# Patient Record
Sex: Female | Born: 1945 | Race: Black or African American | Hispanic: No | Marital: Married | Smoking: Former smoker
Health system: Southern US, Community
[De-identification: ages and names within clinical notes are randomized; demographics above are authoritative.]

## PROBLEM LIST (undated history)

## (undated) DIAGNOSIS — I639 Cerebral infarction, unspecified: Secondary | ICD-10-CM

## (undated) DIAGNOSIS — E785 Hyperlipidemia, unspecified: Secondary | ICD-10-CM

## (undated) DIAGNOSIS — H409 Unspecified glaucoma: Secondary | ICD-10-CM

## (undated) DIAGNOSIS — I10 Essential (primary) hypertension: Secondary | ICD-10-CM

## (undated) DIAGNOSIS — E119 Type 2 diabetes mellitus without complications: Secondary | ICD-10-CM

## (undated) HISTORY — DX: Type 2 diabetes mellitus without complications: E11.9

## (undated) HISTORY — DX: Essential (primary) hypertension: I10

## (undated) HISTORY — DX: Unspecified glaucoma: H40.9

## (undated) HISTORY — DX: Hyperlipidemia, unspecified: E78.5

## (undated) HISTORY — PX: LIPOMA EXCISION: SHX5283

---

## 1998-07-12 ENCOUNTER — Encounter: Admission: RE | Admit: 1998-07-12 | Discharge: 1998-07-12 | Payer: Self-pay | Admitting: Sports Medicine

## 1998-12-16 ENCOUNTER — Encounter: Payer: Self-pay | Admitting: Emergency Medicine

## 1998-12-16 ENCOUNTER — Emergency Department (HOSPITAL_COMMUNITY): Admission: EM | Admit: 1998-12-16 | Discharge: 1998-12-16 | Payer: Self-pay | Admitting: Emergency Medicine

## 1998-12-17 ENCOUNTER — Encounter: Admission: RE | Admit: 1998-12-17 | Discharge: 1998-12-17 | Payer: Self-pay | Admitting: Family Medicine

## 1999-01-14 ENCOUNTER — Encounter: Admission: RE | Admit: 1999-01-14 | Discharge: 1999-01-14 | Payer: Self-pay | Admitting: Family Medicine

## 1999-05-03 ENCOUNTER — Encounter: Admission: RE | Admit: 1999-05-03 | Discharge: 1999-05-03 | Payer: Self-pay | Admitting: Family Medicine

## 2000-10-18 ENCOUNTER — Other Ambulatory Visit: Admission: RE | Admit: 2000-10-18 | Discharge: 2000-10-18 | Payer: Self-pay | Admitting: Obstetrics and Gynecology

## 2001-01-29 ENCOUNTER — Encounter: Admission: RE | Admit: 2001-01-29 | Discharge: 2001-01-29 | Payer: Self-pay | Admitting: Sports Medicine

## 2001-02-02 ENCOUNTER — Emergency Department (HOSPITAL_COMMUNITY): Admission: EM | Admit: 2001-02-02 | Discharge: 2001-02-02 | Payer: Self-pay | Admitting: Emergency Medicine

## 2001-02-02 ENCOUNTER — Encounter: Payer: Self-pay | Admitting: Emergency Medicine

## 2001-02-03 ENCOUNTER — Emergency Department (HOSPITAL_COMMUNITY): Admission: EM | Admit: 2001-02-03 | Discharge: 2001-02-03 | Payer: Self-pay | Admitting: Emergency Medicine

## 2001-02-03 ENCOUNTER — Encounter: Payer: Self-pay | Admitting: Emergency Medicine

## 2001-02-04 ENCOUNTER — Encounter: Admission: RE | Admit: 2001-02-04 | Discharge: 2001-02-04 | Payer: Self-pay | Admitting: Family Medicine

## 2001-12-02 ENCOUNTER — Other Ambulatory Visit: Admission: RE | Admit: 2001-12-02 | Discharge: 2001-12-02 | Payer: Self-pay | Admitting: Obstetrics and Gynecology

## 2002-01-17 ENCOUNTER — Encounter: Payer: Self-pay | Admitting: Gastroenterology

## 2002-01-17 ENCOUNTER — Encounter: Admission: RE | Admit: 2002-01-17 | Discharge: 2002-01-17 | Payer: Self-pay | Admitting: Gastroenterology

## 2002-02-12 ENCOUNTER — Ambulatory Visit (HOSPITAL_COMMUNITY): Admission: RE | Admit: 2002-02-12 | Discharge: 2002-02-12 | Payer: Self-pay | Admitting: Gastroenterology

## 2002-06-30 ENCOUNTER — Encounter (INDEPENDENT_AMBULATORY_CARE_PROVIDER_SITE_OTHER): Payer: Self-pay | Admitting: *Deleted

## 2002-06-30 LAB — CONVERTED CEMR LAB

## 2002-07-09 ENCOUNTER — Encounter: Admission: RE | Admit: 2002-07-09 | Discharge: 2002-07-09 | Payer: Self-pay | Admitting: Family Medicine

## 2002-08-11 ENCOUNTER — Encounter: Admission: RE | Admit: 2002-08-11 | Discharge: 2002-08-11 | Payer: Self-pay | Admitting: Family Medicine

## 2002-08-15 ENCOUNTER — Encounter: Payer: Self-pay | Admitting: Sports Medicine

## 2002-08-15 ENCOUNTER — Encounter: Admission: RE | Admit: 2002-08-15 | Discharge: 2002-08-15 | Payer: Self-pay | Admitting: Sports Medicine

## 2003-02-26 ENCOUNTER — Other Ambulatory Visit: Admission: RE | Admit: 2003-02-26 | Discharge: 2003-02-26 | Payer: Self-pay | Admitting: Obstetrics and Gynecology

## 2003-03-27 ENCOUNTER — Encounter: Admission: RE | Admit: 2003-03-27 | Discharge: 2003-03-27 | Payer: Self-pay | Admitting: Obstetrics and Gynecology

## 2004-01-18 ENCOUNTER — Ambulatory Visit: Payer: Self-pay | Admitting: Family Medicine

## 2004-01-22 ENCOUNTER — Emergency Department (HOSPITAL_COMMUNITY): Admission: EM | Admit: 2004-01-22 | Discharge: 2004-01-22 | Payer: Self-pay | Admitting: Emergency Medicine

## 2004-02-16 ENCOUNTER — Emergency Department (HOSPITAL_COMMUNITY): Admission: EM | Admit: 2004-02-16 | Discharge: 2004-02-16 | Payer: Self-pay | Admitting: Family Medicine

## 2004-05-30 ENCOUNTER — Ambulatory Visit: Payer: Self-pay | Admitting: Internal Medicine

## 2005-08-21 ENCOUNTER — Ambulatory Visit: Payer: Self-pay | Admitting: Internal Medicine

## 2005-11-07 ENCOUNTER — Ambulatory Visit: Payer: Self-pay | Admitting: Internal Medicine

## 2006-02-12 ENCOUNTER — Ambulatory Visit: Payer: Self-pay | Admitting: Internal Medicine

## 2006-05-18 ENCOUNTER — Ambulatory Visit (HOSPITAL_COMMUNITY): Admission: RE | Admit: 2006-05-18 | Discharge: 2006-05-18 | Payer: Self-pay | Admitting: Gastroenterology

## 2006-06-28 DIAGNOSIS — K219 Gastro-esophageal reflux disease without esophagitis: Secondary | ICD-10-CM | POA: Insufficient documentation

## 2006-06-28 DIAGNOSIS — L2089 Other atopic dermatitis: Secondary | ICD-10-CM

## 2006-06-28 DIAGNOSIS — I10 Essential (primary) hypertension: Secondary | ICD-10-CM | POA: Insufficient documentation

## 2006-06-28 DIAGNOSIS — D696 Thrombocytopenia, unspecified: Secondary | ICD-10-CM | POA: Insufficient documentation

## 2006-06-29 ENCOUNTER — Encounter (INDEPENDENT_AMBULATORY_CARE_PROVIDER_SITE_OTHER): Payer: Self-pay | Admitting: *Deleted

## 2006-08-10 ENCOUNTER — Ambulatory Visit (HOSPITAL_BASED_OUTPATIENT_CLINIC_OR_DEPARTMENT_OTHER): Admission: RE | Admit: 2006-08-10 | Discharge: 2006-08-10 | Payer: Self-pay | Admitting: Podiatry

## 2006-08-13 ENCOUNTER — Ambulatory Visit: Payer: Self-pay | Admitting: Internal Medicine

## 2006-08-13 LAB — CONVERTED CEMR LAB
ALT: 22 units/L (ref 0–40)
AST: 22 units/L (ref 0–37)
Cholesterol: 162 mg/dL (ref 0–200)
Direct LDL: 88.9 mg/dL
HDL: 37.3 mg/dL — ABNORMAL LOW (ref >39.0)
TSH: 1.98 microintl units/mL (ref 0.35–5.50)
Total CHOL/HDL Ratio: 4.3
Triglycerides: 251 mg/dL (ref 0–149)
VLDL: 50 mg/dL — ABNORMAL HIGH (ref 0–40)

## 2011-02-02 ENCOUNTER — Other Ambulatory Visit: Payer: Self-pay | Admitting: Family Medicine

## 2011-02-03 ENCOUNTER — Other Ambulatory Visit: Payer: Self-pay | Admitting: Family Medicine

## 2011-02-03 ENCOUNTER — Ambulatory Visit
Admission: RE | Admit: 2011-02-03 | Discharge: 2011-02-03 | Disposition: A | Discharge status: Home / Self Care | Payer: No Typology Code available for payment source | Source: Ambulatory Visit | Attending: Family Medicine | Admitting: Family Medicine

## 2011-02-03 IMAGING — US US ABDOMEN LIMITED
1 series · 14 of 25 positions shown · non-contrast
Comparison: None available.  Report from CT [DATE].

CLINICAL DATA: Elevated liver function tests.

LIMITED ABDOMINAL ULTRASOUND - RIGHT UPPER QUADRANT

[Series 1: us abdomen limited · 0.26mm/px · 14 of 43 slices shown]
[im 1/43]
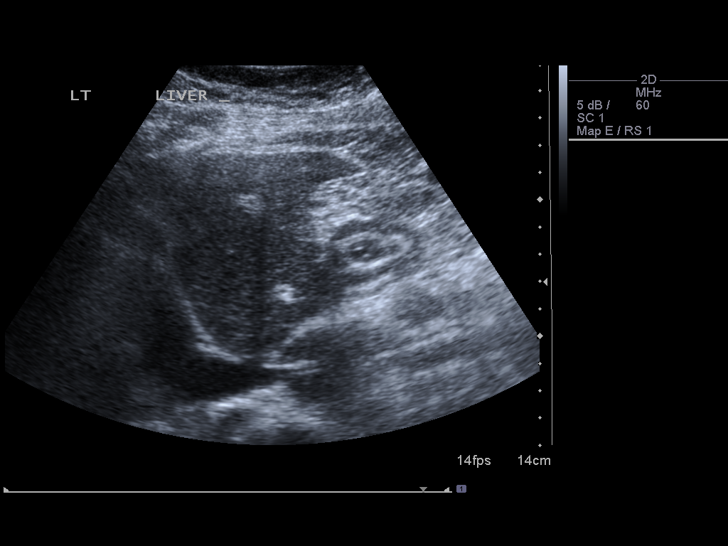
[im 4/43]
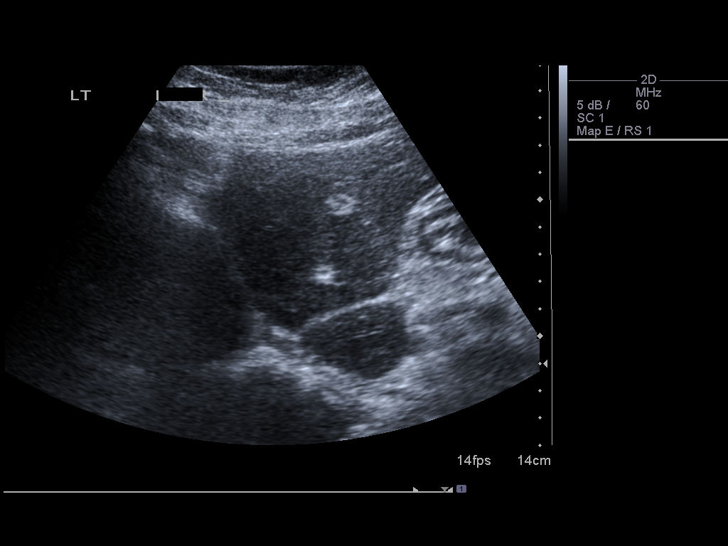
[im 8/43]
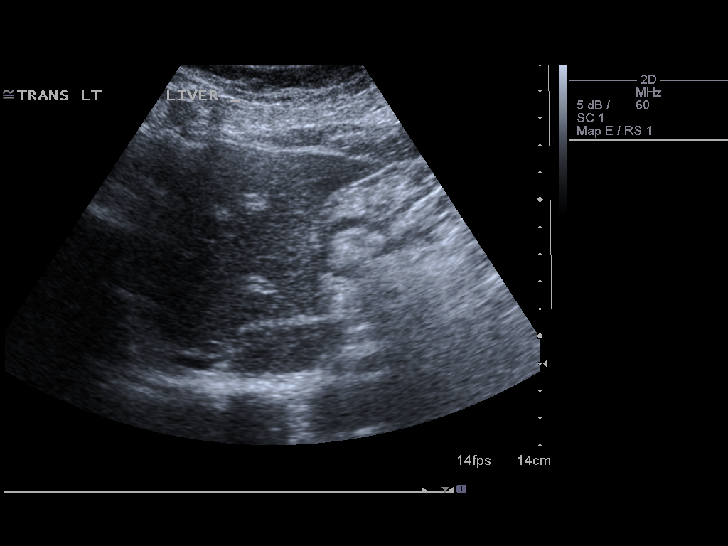
[im 11/43]
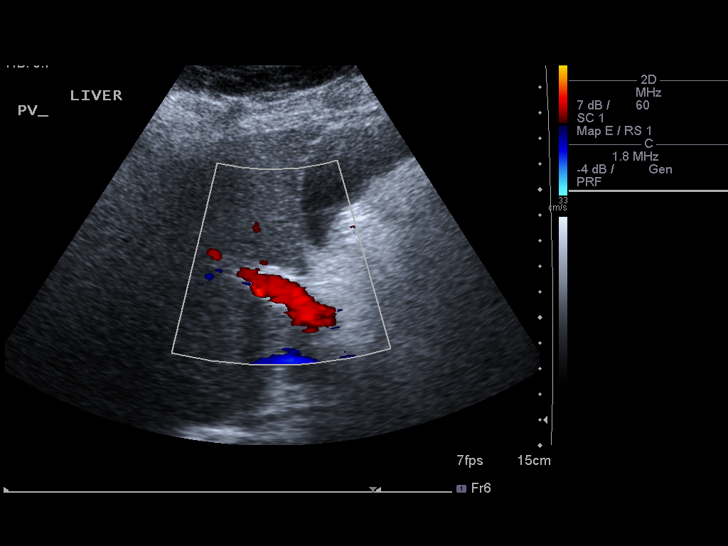
[im 15/43]
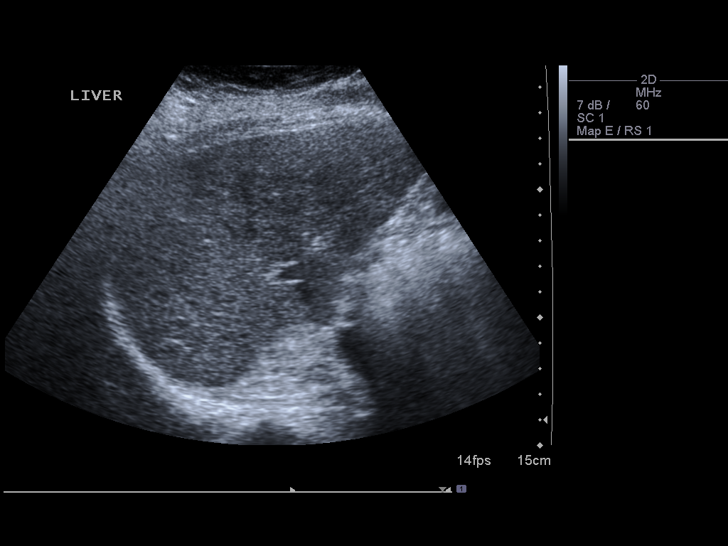
[im 16/43]
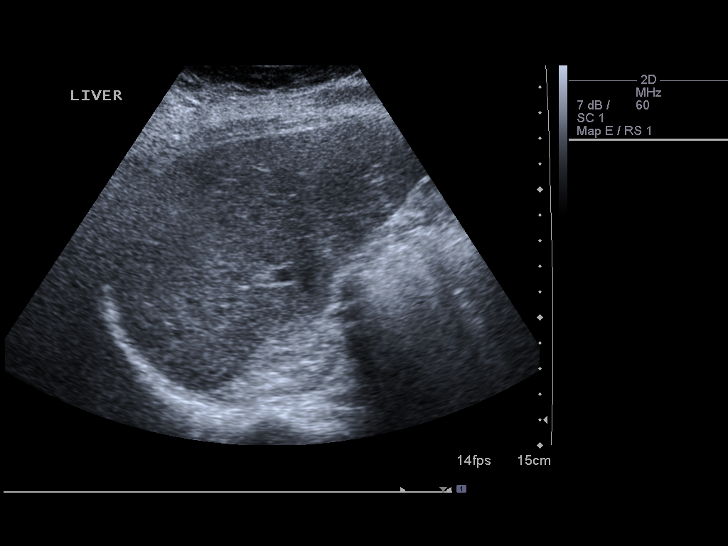
[im 20/43]
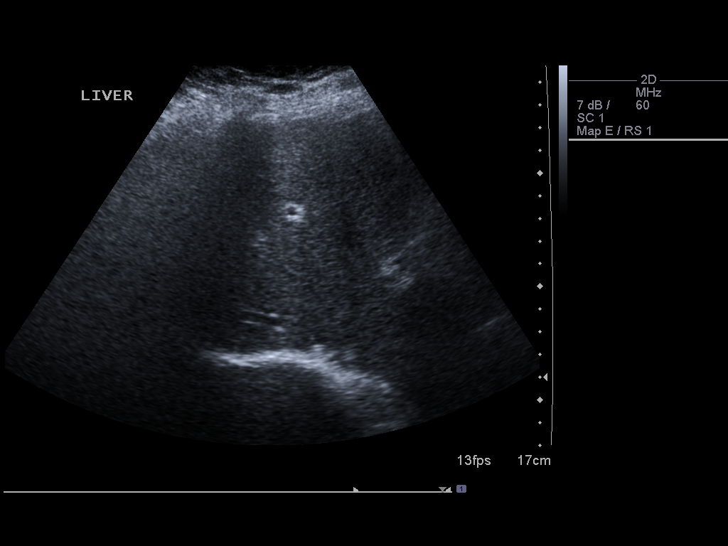
[im 23/43]
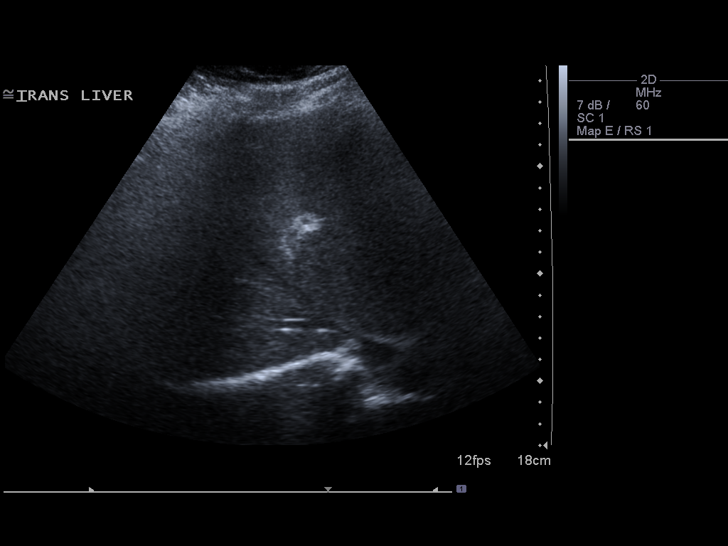
[im 27/43]
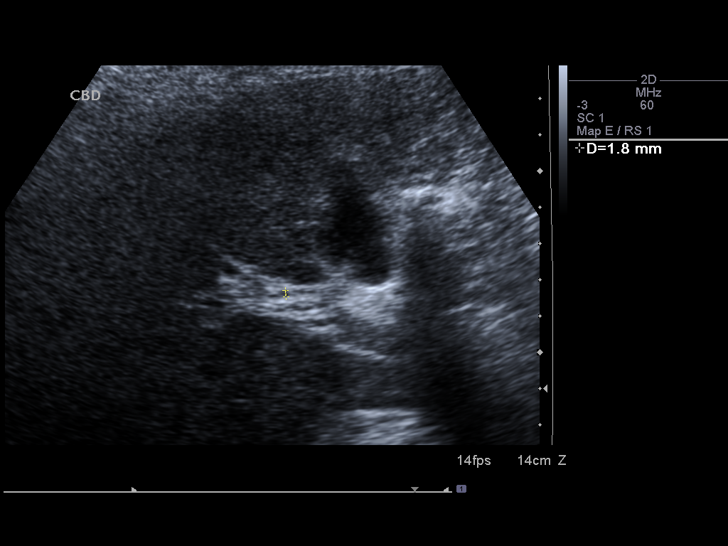
[im 29/43]
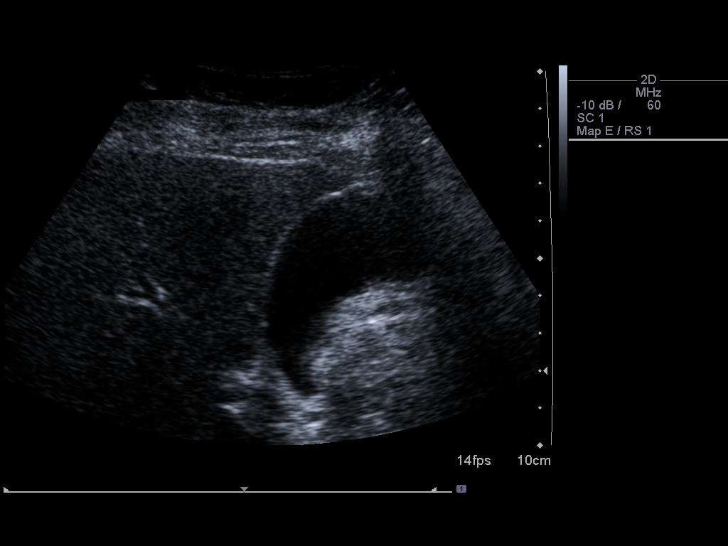
[im 32/43]
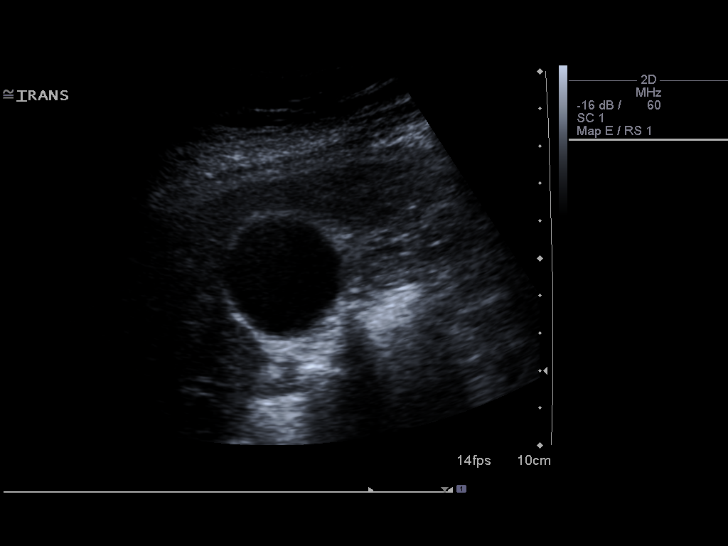
[im 36/43]
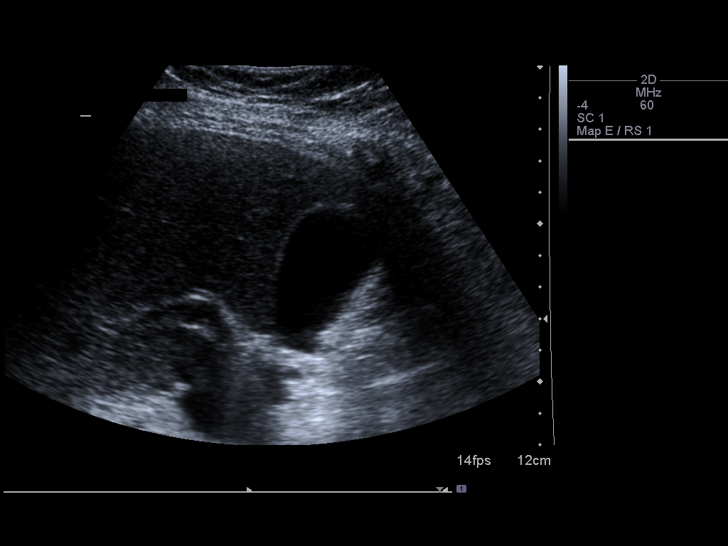
[im 39/43]
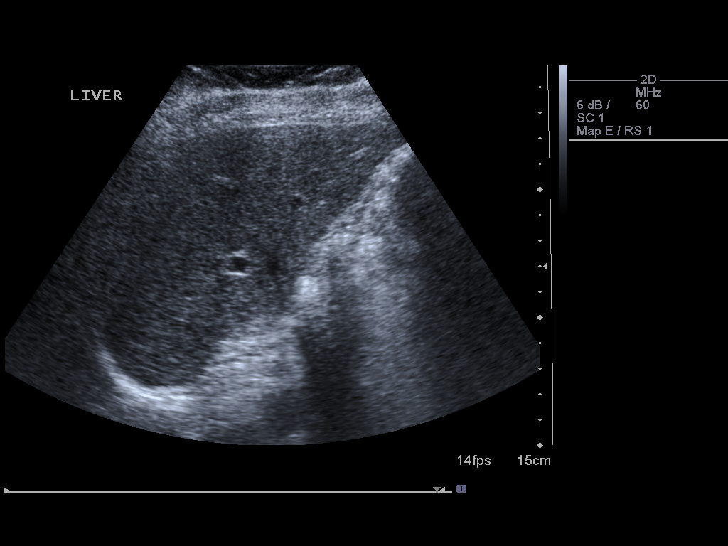
[im 43/43]
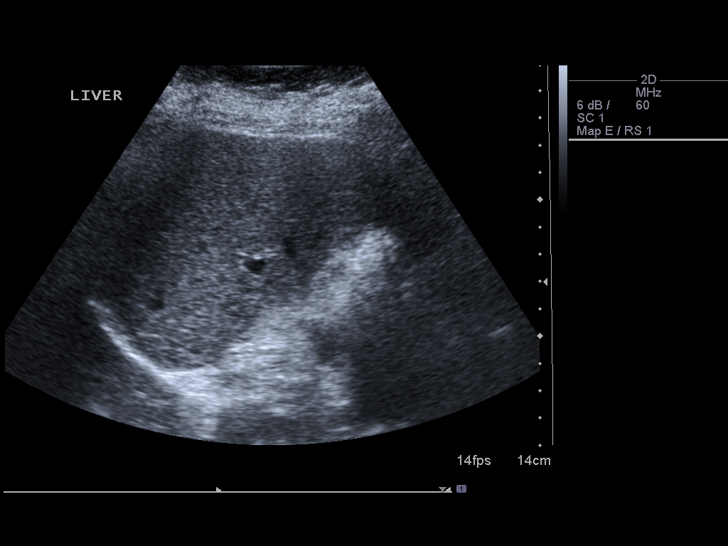

[14 of 25 positions shown; findings below may reference images not displayed]

FINDINGS: Gallbladder:  No gallstones, gallbladder wall thickening, or
pericholecystic fluid.

Common bile duct:  Within normal limits in caliber (1.8 mm).

Liver:  No focal lesion identified.  Within normal limits in
parenchymal echogenicity.
IMPRESSION: Normal right upper quadrant abdominal ultrasound.

## 2014-05-04 DIAGNOSIS — J320 Chronic maxillary sinusitis: Secondary | ICD-10-CM | POA: Diagnosis not present

## 2014-05-04 DIAGNOSIS — H6121 Impacted cerumen, right ear: Secondary | ICD-10-CM | POA: Diagnosis not present

## 2014-05-04 DIAGNOSIS — H6522 Chronic serous otitis media, left ear: Secondary | ICD-10-CM | POA: Diagnosis not present

## 2014-05-04 DIAGNOSIS — J322 Chronic ethmoidal sinusitis: Secondary | ICD-10-CM | POA: Diagnosis not present

## 2014-05-18 DIAGNOSIS — J320 Chronic maxillary sinusitis: Secondary | ICD-10-CM | POA: Diagnosis not present

## 2014-05-18 DIAGNOSIS — J322 Chronic ethmoidal sinusitis: Secondary | ICD-10-CM | POA: Diagnosis not present

## 2014-05-26 ENCOUNTER — Encounter: Payer: Self-pay | Admitting: Family Medicine

## 2014-05-26 DIAGNOSIS — H409 Unspecified glaucoma: Secondary | ICD-10-CM | POA: Insufficient documentation

## 2014-06-11 ENCOUNTER — Encounter: Payer: Self-pay | Admitting: Physician Assistant

## 2014-06-11 ENCOUNTER — Ambulatory Visit (INDEPENDENT_AMBULATORY_CARE_PROVIDER_SITE_OTHER): Payer: Commercial Managed Care - HMO | Admitting: Physician Assistant

## 2014-06-11 ENCOUNTER — Encounter: Payer: Self-pay | Admitting: *Deleted

## 2014-06-11 VITALS — BP 124/66 | HR 76 | Temp 97.7°F | Resp 18 | Ht 65.25 in | Wt 201.0 lb

## 2014-06-11 DIAGNOSIS — R7309 Other abnormal glucose: Secondary | ICD-10-CM | POA: Diagnosis not present

## 2014-06-11 DIAGNOSIS — E119 Type 2 diabetes mellitus without complications: Secondary | ICD-10-CM | POA: Insufficient documentation

## 2014-06-11 DIAGNOSIS — E1122 Type 2 diabetes mellitus with diabetic chronic kidney disease: Secondary | ICD-10-CM | POA: Insufficient documentation

## 2014-06-11 DIAGNOSIS — H409 Unspecified glaucoma: Secondary | ICD-10-CM

## 2014-06-11 DIAGNOSIS — E785 Hyperlipidemia, unspecified: Secondary | ICD-10-CM | POA: Insufficient documentation

## 2014-06-11 DIAGNOSIS — E559 Vitamin D deficiency, unspecified: Secondary | ICD-10-CM | POA: Diagnosis not present

## 2014-06-11 DIAGNOSIS — R739 Hyperglycemia, unspecified: Secondary | ICD-10-CM | POA: Diagnosis not present

## 2014-06-11 DIAGNOSIS — Z23 Encounter for immunization: Secondary | ICD-10-CM | POA: Diagnosis not present

## 2014-06-11 DIAGNOSIS — Z0000 Encounter for general adult medical examination without abnormal findings: Secondary | ICD-10-CM

## 2014-06-11 DIAGNOSIS — I10 Essential (primary) hypertension: Secondary | ICD-10-CM | POA: Diagnosis not present

## 2014-06-11 LAB — COMPLETE METABOLIC PANEL WITH GFR
ALT: 15 U/L (ref 0–35)
AST: 23 U/L (ref 0–37)
Albumin: 4.1 g/dL (ref 3.5–5.2)
Alkaline Phosphatase: 81 U/L (ref 39–117)
BILIRUBIN TOTAL: 0.5 mg/dL (ref 0.2–1.2)
BUN: 17 mg/dL (ref 6–23)
CO2: 24 meq/L (ref 19–32)
CREATININE: 1.04 mg/dL (ref 0.50–1.10)
Calcium: 9.9 mg/dL (ref 8.4–10.5)
Chloride: 104 mEq/L (ref 96–112)
GFR, Est African American: 64 mL/min
GFR, Est Non African American: 56 mL/min — ABNORMAL LOW
GLUCOSE: 117 mg/dL — AB (ref 70–99)
Potassium: 4 mEq/L (ref 3.5–5.3)
Sodium: 141 mEq/L (ref 135–145)
Total Protein: 7.4 g/dL (ref 6.0–8.3)

## 2014-06-11 LAB — CBC WITH DIFFERENTIAL/PLATELET
Basophils Absolute: 0.1 10*3/uL (ref 0.0–0.1)
Basophils Relative: 1 % (ref 0–1)
EOS ABS: 0.1 10*3/uL (ref 0.0–0.7)
EOS PCT: 2 % (ref 0–5)
HEMATOCRIT: 39.2 % (ref 36.0–46.0)
Hemoglobin: 13.3 g/dL (ref 12.0–15.0)
LYMPHS ABS: 2 10*3/uL (ref 0.7–4.0)
Lymphocytes Relative: 33 % (ref 12–46)
MCH: 30.4 pg (ref 26.0–34.0)
MCHC: 33.9 g/dL (ref 30.0–36.0)
MCV: 89.7 fL (ref 78.0–100.0)
MONOS PCT: 7 % (ref 3–12)
MPV: 10.8 fL (ref 8.6–12.4)
Monocytes Absolute: 0.4 10*3/uL (ref 0.1–1.0)
Neutro Abs: 3.5 10*3/uL (ref 1.7–7.7)
Neutrophils Relative %: 57 % (ref 43–77)
Platelets: 210 10*3/uL (ref 150–400)
RBC: 4.37 MIL/uL (ref 3.87–5.11)
RDW: 14.5 % (ref 11.5–15.5)
WBC: 6.1 10*3/uL (ref 4.0–10.5)

## 2014-06-11 LAB — LIPID PANEL
Cholesterol: 242 mg/dL — ABNORMAL HIGH (ref 0–200)
HDL: 46 mg/dL (ref >39)
LDL CALC: 169 mg/dL — AB (ref 0–99)
Total CHOL/HDL Ratio: 5.3 Ratio
Triglycerides: 137 mg/dL (ref <150)
VLDL: 27 mg/dL (ref 0–40)

## 2014-06-11 LAB — TSH: TSH: 2.032 u[IU]/mL (ref 0.350–4.500)

## 2014-06-11 NOTE — Progress Notes (Signed)
Patient ID: AVAGAIL WHITTLESEY MRN: [id number], DOB: 07-20-1945, 69 y.o. Date of Encounter: 06/11/2014,   Chief Complaint: Physical (CPE)  HPI: 69 y.o. y/o female  here for CPE.   She is also being seen as a "new patient"  to reestablish care here. Her last office visit here was 12/2010.  She says that since she was coming here she has gone to the [facility] urgent care. Says that they have been prescribing her 2 blood pressure medications. Says that she has not been diagnosed with any other new medical problems since her visit here. She has no specific complaints or concerns today.   Review of Systems: Consitutional: No fever, chills, fatigue, night sweats, lymphadenopathy. No significant/unexplained weight changes. Eyes: No visual changes, eye redness, or discharge. ENT/Mouth: No ear pain, sore throat, nasal drainage, or sinus pain. Cardiovascular: No chest pressure,heaviness, tightness or squeezing, even with exertion. No increased shortness of breath or dyspnea on exertion.No palpitations, edema, orthopnea, PND. Respiratory: No cough, hemoptysis, SOB, or wheezing. Gastrointestinal: No anorexia, dysphagia, reflux, pain, nausea, vomiting, hematemesis, diarrhea, constipation, BRBPR, or melena. Breast: No mass, nodules, bulging, or retraction. No skin changes or inflammation. No nipple discharge. No lymphadenopathy. Genitourinary: No dysuria, hematuria, incontinence, vaginal discharge, pruritis, burning, abnormal bleeding, or pain. Musculoskeletal: No decreased ROM, No joint pain or swelling. No significant pain in neck, back, or extremities. Skin: No rash, pruritis, or concerning lesions. Neurological: No headache, dizziness, syncope, seizures, tremors, memory loss, coordination problems, or paresthesias. Psychological: No anxiety, depression, hallucinations, SI/HI. Endocrine: No polydipsia, polyphagia, polyuria, or known diabetes.No increased fatigue. No palpitations/rapid heart  rate. No significant/unexplained weight change. All other systems were reviewed and are otherwise negative.  Past Medical History  Diagnosis Date  . Glaucoma   . Hypertension   . Hyperlipidemia   . Diabetes mellitus without complication      History reviewed. No pertinent past surgical history.  Home Meds:  Outpatient Prescriptions Prior to Visit  Medication Sig Dispense Refill  . AMLODIPINE BESYLATE PO Take by mouth.    Marland Kitchen HYDROCHLOROTHIAZIDE PO Take by mouth.     No facility-administered medications prior to visit.    Allergies:  Allergies  Allergen Reactions  . Latex     History   Social History  . Marital Status: Single    Spouse Name: N/A  . Number of Children: N/A  . Years of Education: N/A   Occupational History  . Not on file.   Social History Main Topics  . Smoking status: Former Smoker    Quit date: 05/01/1990  . Smokeless tobacco: Never Used  . Alcohol Use: No  . Drug Use: No  . Sexual Activity: Not Currently   Other Topics Concern  . Not on file   Social History Narrative   Entered 06/2014:    Married x 48 years.   2 Children--2 sons   She exercises 4 days a week. Goes to a  "seniors exercise class"--- says that this is mostly stretching type exercises.  Says that she does go to the facility early and walks the track for 15 minutes prior to the class. No lower extremity claudication symptoms with this and no angina symptoms with this exertion.  Family History  Problem Relation Age of Onset  . Diabetes Sister   . Diabetes Brother   . Diabetes Sister   . Diabetes Sister   . Diabetes Sister   . Diabetes Sister     Physical Exam: Blood pressure 124/66, pulse 76,  temperature 97.7 F (36.5 C), temperature source Oral, resp. rate 18, height 5' 5.25" (1.657 m), weight 201 lb (91.173 kg)., Body mass index is 33.21 kg/(m^2). General: Well developed, well nourished, Female. Appears in no acute distress. HEENT: Normocephalic, atraumatic.  Conjunctiva pink, sclera non-icteric. Pupils 2 mm constricting to 1 mm, round, regular, and equally reactive to light and accomodation. EOMI. Internal auditory canal clear. TMs with good cone of light and without pathology. Nasal mucosa pink. Nares are without discharge. No sinus tenderness. Oral mucosa pink.  Pharynx without exudate.   Neck: Supple. Trachea midline. No thyromegaly. Full ROM. No lymphadenopathy.No Carotid Bruits. Lungs: Clear to auscultation bilaterally without wheezes, rales, or rhonchi. Breathing is of normal effort and unlabored. Cardiovascular: RRR with S1 S2. No murmurs, rubs, or gallops.  No carotid or abdominal bruits. Bilaterally, she has Trace-1+ DP pulses and no palpable PT pulses.  Breast: Pt defers Abdomen: Soft, non-tender, non-distended with normoactive bowel sounds. No hepatosplenomegaly or masses. No rebound/guarding. No CVA tenderness. No hernias.  Genitourinary: Pt defers pelvic exam.  Musculoskeletal: Full range of motion and 5/5 strength throughout. Without swelling, atrophy, tenderness, crepitus, or warmth. Extremities without  edema. Skin: Warm and moist without erythema, ecchymosis, wounds, or rash. Neuro: A+Ox3. CN II-XII grossly intact. Moves all extremities spontaneously. Full sensation throughout. Normal gait. DTR 2+ throughout upper and lower extremities. Finger to nose intact. Psych:  Responds to questions appropriately with a normal affect.   Assessment/Plan:  69 y.o. y/o female here for CPE 1. Visit for preventive health examination  A. Screening Labs: - CBC with Differential/Platelet - COMPLETE METABOLIC PANEL WITH GFR - Lipid panel - Hemoglobin A1c - TSH - Vit D  25 hydroxy (rtn osteoporosis monitoring) - MM Digital Screening; Future  B. Pap: She reports that she did see gynecology in the past but that she probably last saw them in the 1990s. Is that back then her Pap smears were all normal. She defers further pelvic exams or Pap  smears.  C. Screening Mammogram: She reports that she has had no mammogram since the 1990s. She is agreeable for me to schedule follow-up mammogram.  D. DEXA/BMD:  She reports that she has not had a bone density test recently and she is agreeable for me to schedule follow-up bone density test.  E. Colorectal Cancer Screening: She reports that she had a colonoscopy around 2007 that was normal and was performed by Dr. Kara Pacer. Immunizations:  Influenza: She has not had an influenza vaccine for this season and is agreeable to receive today.----Influenza vaccine given here 06/11/14 Tetanus: She reports that she has not had no tetanus vaccine in over 10 years. She is agreeable to receive this today-----T dap given here 06/11/14 Pneumococcal:  She reports that she has received no pneumonia vaccine. She is agreeable to receive first part of this today. Prevnar 13 given here 06/11/14.    WILL NEED TO GIVE PNEUMOVAX 23 IN 6 - 12 MONTHS.  Zostavax:  WILL DISCUSS THIS AT NEXT OV.    2. Hyperlipidemia When she was seen here as a patient in the past she had hyperlipidemia. She is not on any medication for this at present. We'll recheck the status of this. - Lipid panel  3. Hyperglycemia When she was seen here as a patient in the past she had hyperglycemia and newly diagnosed diabetes. She is not on any medication for this at present. We'll recheck the status of this. - Hemoglobin A1c  4. Glaucoma She reports  that she has seen an eye doctor and has been told she has glaucoma.  5. HYPERTENSION, BENIGN SYSTEMIC She is on blood pressure medications at present. Blood pressure is at goal and control today. Check lab to monitor. - COMPLETE METABOLIC PANEL WITH GFR  6. Diabetes mellitus without complication When she was seen here as a patient in the past she had hyperglycemia and newly does most diabetes. She is not on any medication for this at present. We'll recheck the status of this with lab now. -  Hemoglobin A1c     Subjective:   Patient presents for Medicare Annual/Subsequent preventive examination.   Review Past Medical/Family/Social: These are all reviewed and updated today.  Risk Factors  Current exercise habits:   See notes above Dietary issues discussed: See notes above  Cardiac risk factors: Obesity (BMI >= 30 kg/m2).               See notes above regarding diabetes hypertension hyperlipidemia. These are all being monitored/managed today. Depression Screen  (Note: if answer to either of the following is "Yes", a more complete depression screening is indicated)  Over the past two weeks, have you felt down, depressed or hopeless? No Over the past two weeks, have you felt little interest or pleasure in doing things? No Have you lost interest or pleasure in daily life? No Do you often feel hopeless? No Do you cry easily over simple problems? No   Activities of Daily Living  In your present state of health, do you have any difficulty performing the following activities?:  Driving? No  Managing money? No  Feeding yourself? No  Getting from bed to chair? No  Climbing a flight of stairs? No  Preparing food and eating?: No  Bathing or showering? No  Getting dressed: No  Getting to the toilet? No  Using the toilet:No  Moving around from place to place: No  In the past year have you fallen or had a near fall?:No  Are you sexually active? No  Do you have more than one partner? No   Hearing Difficulties: No  Do you often ask people to speak up or repeat themselves? No  Do you experience ringing or noises in your ears? No Do you have difficulty understanding soft or whispered voices? No  Do you feel that you have a problem with memory? No Do you often misplace items? No  Do you feel safe at home? Yes  Cognitive Testing  Alert? Yes Normal Appearance?Yes  Oriented to person? Yes Place? Yes  Time? Yes  Recall of three objects? Yes  Can perform simple calculations?  Yes  Displays appropriate judgment?Yes  Can read the correct time from a watch face?Yes   List the Names of Other Physician/Practitioners you currently use:  These are all listed above  Indicate any recent Medical Services you may have received from other than [facility] providers in the past year (date may be approximate).   Screening Tests / Date------ this information is all listed above Colonoscopy                     Zostavax  Mammogram  Influenza Vaccine  Tetanus/tdap    Assessment:    Annual wellness medicare exam   Plan:    During the course of the visit the patient was educated and counseled about appropriate screening and preventive services including:  Screening mammography  Colorectal cancer screening  Shingles vaccine. Prescription given to that she can get the  vaccine at the pharmacy or Medicare part D.  Screen + for depression. PHQ- 9 score of 12 (moderate depression). We discussed the options of counseling versus possibly a medication. I encouraged her strongly think about the counseling. She is going through some medical problems currently and her husband is as well Mrs. been very stressful for her. She says she will think about it. She does have Xanax to use as needed. Though she may benefit from an SSRI for her more depressive type symptoms but she wants to hold off at this time.  I aksed her to please have her cardioloist send records since we have none on file.  Diet review for nutrition referral? Yes ____ Not Indicated __x__  Patient Instructions (the written plan) was given to the patient.  Medicare Attestation  I have personally reviewed:  The patient's medical and social history  Their use of alcohol, tobacco or illicit drugs  Their current medications and supplements  The patient's functional ability including ADLs,fall risks, home safety risks, cognitive, and hearing and visual impairment  Diet and physical activities  Evidence for depression or mood  disorders  The patient's weight, height, BMI, and visual acuity have been recorded in the chart. I have made referrals, counseling, and provided education to the patient based on review of the above and I have provided the patient with a written personalized care plan for preventive services.       Signed, [address] [place], [place], [facility] 06/11/2014 9:07 AM

## 2014-06-12 ENCOUNTER — Telehealth: Payer: Self-pay | Admitting: Family Medicine

## 2014-06-12 LAB — HEMOGLOBIN A1C
HEMOGLOBIN A1C: 6.7 % — AB (ref <5.7)
MEAN PLASMA GLUCOSE: 146 mg/dL — AB (ref <117)

## 2014-06-12 LAB — VITAMIN D 25 HYDROXY (VIT D DEFICIENCY, FRACTURES): Vit D, 25-Hydroxy: 22 ng/mL — ABNORMAL LOW (ref 30–100)

## 2014-06-12 NOTE — Telephone Encounter (Signed)
Spoke to pt about labs.  She told has had reaction to Lipitor in the past, can not take that.  Will ask provider for different recommendation.  Also told about Vitamin D.  Pt will start to take Vit D 2000 IU daily

## 2014-06-12 NOTE — Telephone Encounter (Signed)
-----   Message from Orlena Sheldon, PA-C sent at 06/12/2014  7:41 AM EST ----- Cholesterol high. Start Lipitor 40mg  i po QD # 30+1.  Recheck FLP/LFT 6 weeks--Place order. Vitmain D low--start otc Vit D at 2,000 IU QD--add to med list. Return to do fasting labs 6 weeks. Return for OV in 3 months --at [place] will discuss low Carb diet, repeat A1C etc

## 2014-06-16 NOTE — Telephone Encounter (Signed)
Can do Simvastatin 40mg  1 po QHS # 30+1

## 2014-06-17 ENCOUNTER — Other Ambulatory Visit: Payer: Self-pay | Admitting: Physician Assistant

## 2014-06-17 DIAGNOSIS — E2839 Other primary ovarian failure: Secondary | ICD-10-CM

## 2014-06-18 ENCOUNTER — Other Ambulatory Visit: Payer: Self-pay | Admitting: Family Medicine

## 2014-06-18 DIAGNOSIS — Z79899 Other long term (current) drug therapy: Secondary | ICD-10-CM

## 2014-06-18 DIAGNOSIS — E785 Hyperlipidemia, unspecified: Secondary | ICD-10-CM

## 2014-06-18 MED ORDER — SIMVASTATIN 40 MG PO TABS
40.0000 mg | ORAL_TABLET | Freq: Every day | ORAL | Status: DC
Start: 2014-06-18 — End: 2014-08-19

## 2014-06-18 NOTE — Telephone Encounter (Signed)
Pt now aware of change to simvastatin.  Understands need lab work in 6 weeks.  If has problem with medication to call us right away.  Order for 6 week lab placed

## 2014-06-23 ENCOUNTER — Encounter: Payer: Self-pay | Admitting: *Deleted

## 2014-06-30 ENCOUNTER — Other Ambulatory Visit: Payer: Self-pay | Admitting: Physician Assistant

## 2014-06-30 DIAGNOSIS — Z1231 Encounter for screening mammogram for malignant neoplasm of breast: Secondary | ICD-10-CM

## 2014-07-08 ENCOUNTER — Ambulatory Visit
Admission: RE | Admit: 2014-07-08 | Discharge: 2014-07-08 | Disposition: A | Discharge status: Home / Self Care | Payer: Commercial Managed Care - HMO | Source: Ambulatory Visit | Attending: Physician Assistant | Admitting: Physician Assistant

## 2014-07-08 ENCOUNTER — Other Ambulatory Visit: Payer: Self-pay | Admitting: Physician Assistant

## 2014-07-08 DIAGNOSIS — R928 Other abnormal and inconclusive findings on diagnostic imaging of breast: Secondary | ICD-10-CM

## 2014-07-08 DIAGNOSIS — E2839 Other primary ovarian failure: Secondary | ICD-10-CM

## 2014-07-08 DIAGNOSIS — M899 Disorder of bone, unspecified: Secondary | ICD-10-CM | POA: Diagnosis not present

## 2014-07-08 DIAGNOSIS — Z1231 Encounter for screening mammogram for malignant neoplasm of breast: Secondary | ICD-10-CM

## 2014-07-08 DIAGNOSIS — H524 Presbyopia: Secondary | ICD-10-CM | POA: Diagnosis not present

## 2014-07-08 DIAGNOSIS — H5210 Myopia, unspecified eye: Secondary | ICD-10-CM | POA: Diagnosis not present

## 2014-07-14 ENCOUNTER — Ambulatory Visit
Admission: RE | Admit: 2014-07-14 | Discharge: 2014-07-14 | Disposition: A | Discharge status: Home / Self Care | Payer: Commercial Managed Care - HMO | Source: Ambulatory Visit | Attending: Physician Assistant | Admitting: Physician Assistant

## 2014-07-14 DIAGNOSIS — R928 Other abnormal and inconclusive findings on diagnostic imaging of breast: Secondary | ICD-10-CM

## 2014-07-14 DIAGNOSIS — R922 Inconclusive mammogram: Secondary | ICD-10-CM | POA: Diagnosis not present

## 2014-07-16 ENCOUNTER — Encounter: Payer: Self-pay | Admitting: *Deleted

## 2014-07-24 ENCOUNTER — Other Ambulatory Visit: Payer: Commercial Managed Care - HMO

## 2014-07-24 DIAGNOSIS — E785 Hyperlipidemia, unspecified: Secondary | ICD-10-CM | POA: Diagnosis not present

## 2014-07-24 DIAGNOSIS — Z79899 Other long term (current) drug therapy: Secondary | ICD-10-CM

## 2014-07-24 LAB — HEPATIC FUNCTION PANEL
ALBUMIN: 4 g/dL (ref 3.5–5.2)
ALT: 16 U/L (ref 0–35)
AST: 22 U/L (ref 0–37)
Alkaline Phosphatase: 68 U/L (ref 39–117)
BILIRUBIN INDIRECT: 0.6 mg/dL (ref 0.2–1.2)
BILIRUBIN TOTAL: 0.7 mg/dL (ref 0.2–1.2)
Bilirubin, Direct: 0.1 mg/dL (ref 0.0–0.3)
TOTAL PROTEIN: 6.7 g/dL (ref 6.0–8.3)

## 2014-07-24 LAB — LIPID PANEL
CHOL/HDL RATIO: 4.7 ratio
Cholesterol: 174 mg/dL (ref 0–200)
HDL: 37 mg/dL — AB (ref >=46)
LDL CALC: 106 mg/dL — AB (ref 0–99)
Triglycerides: 155 mg/dL — ABNORMAL HIGH (ref <150)
VLDL: 31 mg/dL (ref 0–40)

## 2014-08-19 ENCOUNTER — Other Ambulatory Visit: Payer: Self-pay | Admitting: Physician Assistant

## 2014-08-19 NOTE — Telephone Encounter (Signed)
Medication refilled per protocol. 

## 2014-12-10 ENCOUNTER — Ambulatory Visit (INDEPENDENT_AMBULATORY_CARE_PROVIDER_SITE_OTHER): Payer: Commercial Managed Care - HMO | Admitting: Physician Assistant

## 2014-12-10 ENCOUNTER — Encounter: Payer: Self-pay | Admitting: Physician Assistant

## 2014-12-10 VITALS — BP 128/74 | HR 82 | Temp 97.8°F | Resp 16 | Ht 65.0 in | Wt 203.0 lb

## 2014-12-10 DIAGNOSIS — E119 Type 2 diabetes mellitus without complications: Secondary | ICD-10-CM

## 2014-12-10 DIAGNOSIS — Z23 Encounter for immunization: Secondary | ICD-10-CM | POA: Diagnosis not present

## 2014-12-10 DIAGNOSIS — R739 Hyperglycemia, unspecified: Secondary | ICD-10-CM

## 2014-12-10 DIAGNOSIS — E559 Vitamin D deficiency, unspecified: Secondary | ICD-10-CM

## 2014-12-10 DIAGNOSIS — I10 Essential (primary) hypertension: Secondary | ICD-10-CM | POA: Diagnosis not present

## 2014-12-10 DIAGNOSIS — E785 Hyperlipidemia, unspecified: Secondary | ICD-10-CM

## 2014-12-10 LAB — COMPLETE METABOLIC PANEL WITH GFR
ALBUMIN: 4.1 g/dL (ref 3.6–5.1)
ALK PHOS: 70 U/L (ref 33–130)
ALT: 13 U/L (ref 6–29)
AST: 20 U/L (ref 10–35)
BUN: 14 mg/dL (ref 7–25)
CALCIUM: 9.6 mg/dL (ref 8.6–10.4)
CHLORIDE: 103 mmol/L (ref 98–110)
CO2: 27 mmol/L (ref 20–31)
Creat: 1.1 mg/dL — ABNORMAL HIGH (ref 0.50–0.99)
GFR, EST AFRICAN AMERICAN: 60 mL/min (ref >=60)
GFR, EST NON AFRICAN AMERICAN: 52 mL/min — AB (ref >=60)
GLUCOSE: 123 mg/dL — AB (ref 70–99)
POTASSIUM: 4.2 mmol/L (ref 3.5–5.3)
Sodium: 140 mmol/L (ref 135–146)
TOTAL PROTEIN: 7.1 g/dL (ref 6.1–8.1)
Total Bilirubin: 0.5 mg/dL (ref 0.2–1.2)

## 2014-12-10 LAB — LIPID PANEL
CHOL/HDL RATIO: 4.1 ratio (ref <=5.0)
CHOLESTEROL: 148 mg/dL (ref 125–200)
HDL: 36 mg/dL — AB (ref >=46)
LDL Cholesterol: 72 mg/dL (ref <130)
TRIGLYCERIDES: 199 mg/dL — AB (ref <150)
VLDL: 40 mg/dL — ABNORMAL HIGH (ref <30)

## 2014-12-10 LAB — HEMOGLOBIN A1C
HEMOGLOBIN A1C: 6.7 % — AB (ref <5.7)
Mean Plasma Glucose: 146 mg/dL — ABNORMAL HIGH (ref <117)

## 2014-12-10 MED ORDER — ZOSTER VACCINE LIVE 19400 UNT/0.65ML ~~LOC~~ SOLR: 0.6500 mL | Freq: Once | SUBCUTANEOUS | Status: DC

## 2014-12-10 NOTE — Progress Notes (Signed)
Patient ID: Heather Gross MRN: [id number], DOB: 1946/03/12, 69 y.o. Date of Encounter: 12/10/2014,   Chief Complaint: Routine follow-up office visit and lab work.  HPI: 69 y.o. y/o female  here for routine follow-up office visit and lab work.  She was seen on 06/11/2014 as a "new patient"  to reestablish care here. Her last office visit here prior to that was 12/2010.  She said that since she was coming here she has gone to the [facility]. Said that they had been prescribing her 2 blood pressure medications. Said that she had not been diagnosed with any other new medical problems since her visit here.  At that visit 06/11/2014 we did complete physical exam. Did full panel a screening lab work. Updated preventative care.  Labs revealed hyperlipidemia with LDL 169. Patient then started simvastatin 40 mg. Had repeat lipid panel 07/24/14 which showed LDL 106 and LFTs normal. She continues on simvastatin 40 mg. She is having no myalgias and no other adverse effects.  Labs also revealed A1c 6.7. Planned that I would review carbohydrate handout with her follow-up visit----has been given and reviewed in detail at visit 12/10/2014. Pt seems very interested/motivated in making changes.  Labs also revealed vitamin D deficiency at 39. Recommended she start 2000 units daily. At follow-up office visit 12/10/2014 she states that yes she is taking 2000 units daily of vitamin D.  She continues to do her exercise. She exercises 4 days a week. Goes to a  "seniors exercise class"--- says that this is mostly stretching type exercises. Says that she does go to the facility early and walks the track for 15 minutes prior to the class. No lower extremity claudication symptoms with this and no angina symptoms with this exertion.     Review of Systems: Consitutional: No fever, chills, fatigue, night sweats, lymphadenopathy. No significant/unexplained weight changes. Eyes: No visual changes, eye  redness, or discharge. ENT/Mouth: No ear pain, sore throat, nasal drainage, or sinus pain. Cardiovascular: No chest pressure,heaviness, tightness or squeezing, even with exertion. No increased shortness of breath or dyspnea on exertion.No palpitations, edema, orthopnea, PND. Respiratory: No cough, hemoptysis, SOB, or wheezing. Gastrointestinal: No anorexia, dysphagia, reflux, pain, nausea, vomiting, hematemesis, diarrhea, constipation, BRBPR, or melena. Breast: No mass, nodules, bulging, or retraction. No skin changes or inflammation. No nipple discharge. No lymphadenopathy. Genitourinary: No dysuria, hematuria, incontinence, vaginal discharge, pruritis, burning, abnormal bleeding, or pain. Musculoskeletal: No decreased ROM, No joint pain or swelling. No significant pain in neck, back, or extremities. Skin: No rash, pruritis, or concerning lesions. Neurological: No headache, dizziness, syncope, seizures, tremors, memory loss, coordination problems, or paresthesias. Psychological: No anxiety, depression, hallucinations, SI/HI. Endocrine: No polydipsia, polyphagia, polyuria, or known diabetes.No increased fatigue. No palpitations/rapid heart rate. No significant/unexplained weight change. All other systems were reviewed and are otherwise negative.  Past Medical History  Diagnosis Date  . Glaucoma   . Hypertension   . Hyperlipidemia   . Diabetes mellitus without complication      History reviewed. No pertinent past surgical history.  Home Meds:  Outpatient Prescriptions Prior to Visit  Medication Sig Dispense Refill  . amLODipine (NORVASC) 10 MG tablet Take 1 tablet by mouth daily.    . Cholecalciferol (VITAMIN D) 2000 UNITS tablet Take 2,000 Units by mouth daily.    . hydrochlorothiazide (MICROZIDE) 12.5 MG capsule Take 1 capsule by mouth daily.    . Multiple Vitamin (MULTIVITAMIN) tablet Take 1 tablet by mouth daily.    Marland Kitchen  simvastatin (ZOCOR) 40 MG tablet TAKE 1 TABLET (40 MG TOTAL) BY  MOUTH AT BEDTIME. 30 tablet 5   No facility-administered medications prior to visit.    Allergies:  Allergies  Allergen Reactions  . Latex   . Lipitor [Atorvastatin] Other (See Comments)    Made mouth very dry and lips discolored    Social History   Social History  . Marital Status: Single    Spouse Name: N/A  . Number of Children: N/A  . Years of Education: N/A   Occupational History  . Not on file.   Social History Main Topics  . Smoking status: Former Smoker    Quit date: 05/01/1990  . Smokeless tobacco: Never Used  . Alcohol Use: No  . Drug Use: No  . Sexual Activity: Not Currently   Other Topics Concern  . Not on file   Social History Narrative   Entered 06/2014:    Married x 48 years.   2 Children--2 sons   She exercises 4 days a week. Goes to a  "seniors exercise class"--- says that this is mostly stretching type exercises.  Says that she does go to the facility early and walks the track for 15 minutes prior to the class. No lower extremity claudication symptoms with this and no angina symptoms with this exertion.  Family History  Problem Relation Age of Onset  . Diabetes Sister   . Diabetes Brother   . Diabetes Sister   . Diabetes Sister   . Diabetes Sister   . Diabetes Sister     Physical Exam: Blood pressure 128/74, pulse 82, temperature 97.8 F (36.6 C), temperature source Oral, resp. rate 16, height 5\' 5"  (1.651 m), weight 203 lb (92.08 kg)., Body mass index is 33.78 kg/(m^2). General: Well developed, well nourished,AA Female. Appears in no acute distress. Neck: Supple. Trachea midline. No thyromegaly. Full ROM. No lymphadenopathy.No Carotid Bruits. Lungs: Clear to auscultation bilaterally without wheezes, rales, or rhonchi. Breathing is of normal effort and unlabored. Cardiovascular: RRR with S1 S2. No murmurs, rubs, or gallops.  Bilaterally, she has Trace-1+ DP pulses and no palpable PT pulses.  Abdomen: Soft, non-tender, non-distended with  normoactive bowel sounds. No hepatosplenomegaly or masses. No rebound/guarding. No CVA tenderness. No hernias.  Musculoskeletal: Full range of motion and 5/5 strength throughout.  Extremities without  edema. Skin: Warm and moist without erythema, ecchymosis, wounds, or rash. Neuro: A+Ox3. CN II-XII grossly intact. Moves all extremities spontaneously. Full sensation throughout. Normal gait.  Psych:  Responds to questions appropriately with a normal affect.   Assessment/Plan:  69 y.o. y/o female here for  . Hyperglycemia When she was seen here as a patient in the past she had hyperglycemia and newly diagnosed diabetes.  When she came back here as to reestablish care with [place] 06/11/14 A1c was 6.7. At office visit 12/10/14 carbohydrate handout was reviewed with her in detail. In addition to the carbohydrates that she needs to limit, also discussed foods that she can eat. Repeat A1c 12/10/2014. - Hemoglobin A1c   Diabetes mellitus without complication When she was seen here as a patient in the past she had hyperglycemia and newly does most diabetes.  When she came back here as to reestablish care with [place] 06/11/14 A1c was 6.7. At office visit 12/10/14 carbohydrate handout was reviewed with her in detail. In addition to the carbohydrates that she needs to limit, also discussed foods that she can eat. Repeat A1c 12/10/2014. - Hemoglobin A1c  She is on Statin.  She currently is not on aspirin or ACE inhibitor. If A1c does not come down with diet changes and she does continue with diagnosis of diabetes then we'll need to add aspirin and also replace one of her current blood pressure medications with Ace Inhibitor.  Also she continues with diagnosis of diabetes then will: Do microalbumin Have her schedule annual eye exams Document diabetic foot exam and quality metrics   2. Hyperlipidemia When she was seen here as a patient in the past she had hyperlipidemia.  When she was seen here 06/11/2014 she was  not on any medication for this.  06/11/2014 lipid panel showed LDL 169. She then started simvastatin 40 mg. Follow-up lipid panel 07/24/14 showed LDL 106. Continued simvastatin 40 mg. Recheck FLP and LFT now 12/10/14. - Lipid panel/LFT   3. HYPERTENSION, BENIGN SYSTEMIC She is on blood pressure medications at present. Blood pressure is at goal and control today. Check lab to monitor. - COMPLETE METABOLIC PANEL WITH GFR   4. Vitamin D deficiency - Vit D  25 hydroxy (rtn osteoporosis monitoring) 06/11/14 labs vitamin D was 22. At that time she did start vitamin D 2000 IUs daily. Verified that she is taking this at her visit 12/10/14. Recheck vitamin D level at that visit.  5. Glaucoma At OV 06/11/2014:She reports that she has seen an eye doctor and has been told she has glaucoma. At [place] 12/10/2014--reviewed that her last eye exam was July 2015. And I discussed that she needs to schedule annual follow-up eye exam she says that she usually goes every 2 years. Then asked about the glaucoma and she says that she had glaucoma about 10 years ago but she had treatment with laser surgery to correct that. Will wait and see if her sugar gets controlled with carbohydrate diet changes. If she continues with diagnosis of diabetes then will discuss need for annual eye exams.  THE FOLLOWING IS COPIED FROM HER CPE NOTE 06/11/2014:  Visit for preventive health examination  A. Screening Labs: - CBC with Differential/Platelet - COMPLETE METABOLIC PANEL WITH GFR - Lipid panel - Hemoglobin A1c - TSH - Vit D  25 hydroxy (rtn osteoporosis monitoring) - MM Digital Screening; Future  B. Pap: She reports that she did see gynecology in the past but that she probably last saw them in the 1990s. Is that back then her Pap smears were all normal. She defers further pelvic exams or Pap smears.  C. Screening Mammogram: 06/11/2014: "She reports that she has had no mammogram since the 1990s. She is agreeable for me to  schedule follow-up mammogram." She had mammogram 07/08/14 and further evaluation was needed so they did that 07/14/2014. Negative.  D. DEXA/BMD:  06/11/2014: She reports that she has not had a bone density test recently and she is agreeable for me to schedule follow-up bone density test. She had bone density scan 07/08/14. We have already called her regarding those results and gave her instructions. This showed osteopenia. We recommended her continue her weightbearing exercise and also take calcium and vitamin D. Today 12/10/14 she states that she is taking 1500 mg of calcium a day and 2000 units vitamin D daily.  E. Colorectal Cancer Screening: She reports that she had a colonoscopy around 2007 that was normal and was performed by Dr. Collene Mares ---------------------AT NEXT OV--WILL GET HER TO SIGN RELEASE TO GET RECORD FROM DR Collene Mares-----------------------------------------------------------  F. Immunizations:  Influenza: ----Influenza vaccine given here 06/11/14 Tetanus: ----T dap given here 06/11/14 Pneumococcal:---- Prevnar 13 given  here 06/11/14   ------Pneumovax 23--Given here 12/10/2014 Zostavax:  AT OV 12/10/2014---I WROTE THIS ON HER AVS TO REMIND HER---TO CHECK COST WITH INSURANCE THEN CALL us    She is to schedule follow-up office visit 3 months.  Marin Olp Chamberino, [place], [facility] 12/10/2014 8:27 AM

## 2014-12-11 LAB — VITAMIN D 25 HYDROXY (VIT D DEFICIENCY, FRACTURES): Vit D, 25-Hydroxy: 38 ng/mL (ref 30–100)

## 2015-02-17 ENCOUNTER — Other Ambulatory Visit: Payer: Self-pay | Admitting: Physician Assistant

## 2015-02-17 NOTE — Telephone Encounter (Signed)
Medication refilled per protocol. 

## 2015-02-24 ENCOUNTER — Ambulatory Visit (INDEPENDENT_AMBULATORY_CARE_PROVIDER_SITE_OTHER): Payer: Commercial Managed Care - HMO | Admitting: Physician Assistant

## 2015-02-24 ENCOUNTER — Encounter: Payer: Self-pay | Admitting: Physician Assistant

## 2015-02-24 VITALS — BP 114/72 | HR 64 | Temp 97.7°F | Resp 18 | Wt 202.0 lb

## 2015-02-24 DIAGNOSIS — E559 Vitamin D deficiency, unspecified: Secondary | ICD-10-CM

## 2015-02-24 DIAGNOSIS — I10 Essential (primary) hypertension: Secondary | ICD-10-CM | POA: Diagnosis not present

## 2015-02-24 DIAGNOSIS — E119 Type 2 diabetes mellitus without complications: Secondary | ICD-10-CM

## 2015-02-24 DIAGNOSIS — Z23 Encounter for immunization: Secondary | ICD-10-CM | POA: Diagnosis not present

## 2015-02-24 DIAGNOSIS — R739 Hyperglycemia, unspecified: Secondary | ICD-10-CM

## 2015-02-24 DIAGNOSIS — E785 Hyperlipidemia, unspecified: Secondary | ICD-10-CM | POA: Diagnosis not present

## 2015-02-24 MED ORDER — AMLODIPINE BESYLATE 10 MG PO TABS: 10.0000 mg | ORAL_TABLET | Freq: Every day | ORAL | Status: DC

## 2015-02-24 MED ORDER — HYDROCHLOROTHIAZIDE 12.5 MG PO CAPS: 12.5000 mg | ORAL_CAPSULE | Freq: Every day | ORAL | Status: DC

## 2015-02-24 NOTE — Progress Notes (Signed)
Patient ID: Heather Gross MRN: [id number], DOB: 03-08-1946, 69 y.o. Date of Encounter: 02/24/2015,   Chief Complaint: Routine follow-up office visit and lab work.  HPI: 69 y.o. y/o female  here for routine follow-up office visit and lab work.  She was seen on 06/11/2014 as a "new patient"  to reestablish care here. Her last office visit here prior to that was 12/2010.  She said that since she was coming here she has gone to the [facility]. Said that they had been prescribing her 2 blood pressure medications. Said that she had not been diagnosed with any other new medical problems since her visit here.  At that visit 06/11/2014 we did complete physical exam. Did full panel a screening lab work. Updated preventative care.  Labs revealed hyperlipidemia with LDL 169. Patient then started simvastatin 40 mg. Had repeat lipid panel 07/24/14 which showed LDL 106 and LFTs normal. She continues on simvastatin 40 mg. She is having no myalgias and no other adverse effects.  Labs also revealed A1c 6.7. Planned that I would review carbohydrate handout with her follow-up visit----has been given and reviewed in detail at visit 12/10/2014. Pt seems very interested/motivated in making changes.  Labs also revealed vitamin D deficiency at 7. Recommended she start 2000 units daily. At follow-up office visit 12/10/2014 she states that yes she is taking 2000 units daily of vitamin D.  She continues to do her exercise. She exercises 4 days a week. Goes to a  "seniors exercise class"--- says that this is mostly stretching type exercises. Says that she does go to the facility early and walks the track for 15 minutes prior to the class. No lower extremity claudication symptoms with this and no angina symptoms with this exertion.  OV 02/24/2015: She says that she came in for visit today because her pharmacy kept telling her that they could not refill medicines and that she had to have office visit  to get refills. However when our staff reviewed her chart we did not see that we had gotten any refill request and that she just had office visit here 12/10/14 and it hasn't even been a full 3 months since last visit. Nonetheless patient presents here for follow-up visit in refills on medicines. I reviewed that at her last visit I gave and reviewed the carbohydrate diet handout. Today she says that she "has cut back on whole lot". Asked her if there were any specific foods that she relies she was consuming too much of that she has really made a big change in.  She says that she really likes bread and has really had to cut that back and then as well and potatoes and rice too". I'll ever I did review her weight--- at the visit 12/10/14 weight was 203. Today 02/24/15 ---202. Would've expected more weight loss than this if she had made significant diet changes and really hadn't eaten that much of these foods prior to the diet changes She has no complaints or concerns today.   Review of Systems: Consitutional: No fever, chills, fatigue, night sweats, lymphadenopathy. No significant/unexplained weight changes. Eyes: No visual changes, eye redness, or discharge. ENT/Mouth: No ear pain, sore throat, nasal drainage, or sinus pain. Cardiovascular: No chest pressure,heaviness, tightness or squeezing, even with exertion. No increased shortness of breath or dyspnea on exertion.No palpitations, edema, orthopnea, PND. Respiratory: No cough, hemoptysis, SOB, or wheezing. Gastrointestinal: No anorexia, dysphagia, reflux, pain, nausea, vomiting, hematemesis, diarrhea, constipation, BRBPR, or melena. Breast:  No mass, nodules, bulging, or retraction. No skin changes or inflammation. No nipple discharge. No lymphadenopathy. Genitourinary: No dysuria, hematuria, incontinence, vaginal discharge, pruritis, burning, abnormal bleeding, or pain. Musculoskeletal: No decreased ROM, No joint pain or swelling. No significant pain  in neck, back, or extremities. Skin: No rash, pruritis, or concerning lesions. Neurological: No headache, dizziness, syncope, seizures, tremors, memory loss, coordination problems, or paresthesias. Psychological: No anxiety, depression, hallucinations, SI/HI. Endocrine: No polydipsia, polyphagia, polyuria, or known diabetes.No increased fatigue. No palpitations/rapid heart rate. No significant/unexplained weight change. All other systems were reviewed and are otherwise negative.  Past Medical History  Diagnosis Date  . Glaucoma   . Hypertension   . Hyperlipidemia   . Diabetes mellitus without complication ([place])      History reviewed. No pertinent past surgical history.  Home Meds:  Outpatient Prescriptions Prior to Visit  Medication Sig Dispense Refill  . Cholecalciferol (VITAMIN D) 2000 UNITS tablet Take 2,000 Units by mouth daily.    . Multiple Vitamin (MULTIVITAMIN) tablet Take 1 tablet by mouth daily.    . simvastatin (ZOCOR) 40 MG tablet TAKE 1 TABLET BY MOUTH AT BEDTIME 90 tablet 1  . amLODipine (NORVASC) 10 MG tablet Take 1 tablet by mouth daily.    . hydrochlorothiazide (MICROZIDE) 12.5 MG capsule Take 1 capsule by mouth daily.    Marland Kitchen zoster vaccine live, PF, (ZOSTAVAX) 16109 UNT/0.65ML injection Inject 19,400 Units into the skin once. 1 each 0   No facility-administered medications prior to visit.    Allergies:  Allergies  Allergen Reactions  . Latex   . Lipitor [Atorvastatin] Other (See Comments)    Made mouth very dry and lips discolored    Social History   Social History  . Marital Status: Single    Spouse Name: N/A  . Number of Children: N/A  . Years of Education: N/A   Occupational History  . Not on file.   Social History Main Topics  . Smoking status: Former Smoker    Quit date: 05/01/1990  . Smokeless tobacco: Never Used  . Alcohol Use: No  . Drug Use: No  . Sexual Activity: Not Currently   Other Topics Concern  . Not on file   Social History  Narrative   Entered 06/2014:    Married x 48 years.   2 Children--2 sons   She exercises 4 days a week. Goes to a  "seniors exercise class"--- says that this is mostly stretching type exercises.  Says that she does go to the facility early and walks the track for 15 minutes prior to the class. No lower extremity claudication symptoms with this and no angina symptoms with this exertion.  Family History  Problem Relation Age of Onset  . Diabetes Sister   . Diabetes Brother   . Diabetes Sister   . Diabetes Sister   . Diabetes Sister   . Diabetes Sister     Physical Exam: Blood pressure 114/72, pulse 64, temperature 97.7 F (36.5 C), temperature source Oral, resp. rate 18, weight 202 lb (91.627 kg)., Body mass index is 33.61 kg/(m^2). General: Well developed, well nourished,AA Female. Appears in no acute distress. Neck: Supple. Trachea midline. No thyromegaly. Full ROM. No lymphadenopathy.No Carotid Bruits. Lungs: Clear to auscultation bilaterally without wheezes, rales, or rhonchi. Breathing is of normal effort and unlabored. Cardiovascular: RRR with S1 S2. No murmurs, rubs, or gallops.  Bilaterally, she has Trace-1+ DP pulses and no palpable PT pulses.  Abdomen: Soft, non-tender, non-distended with normoactive bowel sounds.  No hepatosplenomegaly or masses. No rebound/guarding. No CVA tenderness. No hernias.  Musculoskeletal: Full range of motion and 5/5 strength throughout.  Extremities without  edema. Skin: Warm and moist without erythema, ecchymosis, wounds, or rash. Neuro: A+Ox3. CN II-XII grossly intact. Moves all extremities spontaneously. Full sensation throughout. Normal gait.  Psych:  Responds to questions appropriately with a normal affect.   Assessment/Plan:  69 y.o. y/o female here for  . Hyperglycemia When she was seen here as a patient in the past she had hyperglycemia and newly diagnosed diabetes.  When she came back here as to reestablish care with [place] 06/11/14 A1c was  6.7. At office visit 12/10/14 carbohydrate handout was reviewed with her in detail. In addition to the carbohydrates that she needs to limit, also discussed foods that she can eat. Repeat A1c 12/10/2014.  02/24/15---I reviewed that at her last visit I gave and reviewed the carbohydrate diet handout. Today she says that she "has cut back on whole lot". Asked her if there were any specific foods that she relies she was consuming too much of that she has really made a big change in.  She says that she really likes bread and has really had to cut that back and then as well and potatoes and rice too". I'll ever I did review her weight--- at the visit 12/10/14 weight was 203. Today 02/24/15 ---202. Would've expected more weight loss than this if she had made significant diet changes and really hadn't eaten that much of these foods prior to the diet changes   Diabetes mellitus without complication When she was seen here as a patient in the past she had hyperglycemia and newly does most diabetes.  When she came back here as to reestablish care with [place] 06/11/14 A1c was 6.7. At office visit 12/10/14 carbohydrate handout was reviewed with her in detail. In addition to the carbohydrates that she needs to limit, also discussed foods that she can eat. Repeat A1c 12/10/2014.  02/24/15---I reviewed that at her last visit I gave and reviewed the carbohydrate diet handout. Today she says that she "has cut back on whole lot". Asked her if there were any specific foods that she relies she was consuming too much of that she has really made a big change in.  She says that she really likes bread and has really had to cut that back and then as well and potatoes and rice too". I'll ever I did review her weight--- at the visit 12/10/14 weight was 203. Today 02/24/15 ---202. Would've expected more weight loss than this if she had made significant diet changes and really hadn't eaten that much of these foods prior to the diet  changes   - Hemoglobin A1c  She is on Statin.  She currently is not on aspirin or ACE inhibitor. If A1c does not come down with diet changes and she does continue with diagnosis of diabetes then we'll need to add aspirin and also replace one of her current blood pressure medications with Ace Inhibitor.  Also she continues with diagnosis of diabetes then will: Do microalbumin Have her schedule annual eye exams Document diabetic foot exam and quality metrics   2. Hyperlipidemia When she was seen here as a patient in the past she had hyperlipidemia.  When she was seen here 06/11/2014 she was not on any medication for this.  06/11/2014 lipid panel showed LDL 169. She then started simvastatin 40 mg. Follow-up lipid panel 07/24/14 showed LDL 106. Continued simvastatin 40 mg.  Rechecked  FLP and LFT  12/10/14.----- labs were stable. She was to continue current medication and follow low carbohydrate handout    3. HYPERTENSION, BENIGN SYSTEMIC She is on blood pressure medications at present. Blood pressure is at goal and control today. BMET normal/stable at last check 12/10/2014   4. Vitamin D deficiency - Vit D  25 hydroxy (rtn osteoporosis monitoring) 06/11/14 labs vitamin D was 22. At that time she did start vitamin D 2000 IUs daily. Verified that she is taking this at her visit 12/10/14. Recheck vitamin D level at that visit.  5. Glaucoma At OV 06/11/2014:She reports that she has seen an eye doctor and has been told she has glaucoma. At [place] 12/10/2014--reviewed that her last eye exam was July 2015. And I discussed that she needs to schedule annual follow-up eye exam she says that she usually goes every 2 years. Then asked about the glaucoma and she says that she had glaucoma about 10 years ago but she had treatment with laser surgery to correct that. Will wait and see if her sugar gets controlled with carbohydrate diet changes. If she continues with diagnosis of diabetes then will discuss need for  annual eye exams.  THE FOLLOWING IS COPIED FROM HER CPE NOTE 06/11/2014:  Visit for preventive health examination  A. Screening Labs: - CBC with Differential/Platelet - COMPLETE METABOLIC PANEL WITH GFR - Lipid panel - Hemoglobin A1c - TSH - Vit D  25 hydroxy (rtn osteoporosis monitoring) - MM Digital Screening; Future  B. Pap: She reports that she did see gynecology in the past but that she probably last saw them in the 1990s. Is that back then her Pap smears were all normal. She defers further pelvic exams or Pap smears.  C. Screening Mammogram: 06/11/2014: "She reports that she has had no mammogram since the 1990s. She is agreeable for me to schedule follow-up mammogram." She had mammogram 07/08/14 and further evaluation was needed so they did that 07/14/2014. Negative.  D. DEXA/BMD:  06/11/2014: She reports that she has not had a bone density test recently and she is agreeable for me to schedule follow-up bone density test. She had bone density scan 07/08/14. We have already called her regarding those results and gave her instructions. This showed osteopenia. We recommended her continue her weightbearing exercise and also take calcium and vitamin D. Today 12/10/14 she states that she is taking 1500 mg of calcium a day and 2000 units vitamin D daily.  E. Colorectal Cancer Screening: She reports that she had a colonoscopy around 2007 that was normal and was performed by Dr. Collene Mares ---------------------AT 02/24/15 OV----I HAD HER SIGN RELEASE TO GET RECORD FROM DR Collene Mares-----------------------  F. Immunizations:  Influenza: ----Influenza vaccine given here 06/11/14 Tetanus: ----T dap given here 06/11/14 Pneumococcal:---- Prevnar 13 given here 06/11/14   ------Pneumovax 23--Given here 12/10/2014 Zostavax:  AT OV 12/10/2014---I WROTE THIS ON HER AVS TO REMIND HER---TO CHECK COST WITH INSURANCE THEN CALL us ----------------AT OV 02/24/15-----he says that she did check on this and it was going to be  a $47 co-pay and she is agreeable to pay this price. Today I had chem go ahead and send order to the pharmacy and I told patient to go to the pharmacy and get this.    She is to schedule follow-up office visit 3 months. Follow-up sooner if needed.  Signed, [address] [place], [place], [facility] 02/24/2015 9:08 AM

## 2015-03-15 ENCOUNTER — Ambulatory Visit: Payer: Commercial Managed Care - HMO | Admitting: Physician Assistant

## 2015-08-25 ENCOUNTER — Other Ambulatory Visit: Payer: Self-pay | Admitting: Physician Assistant

## 2015-08-25 ENCOUNTER — Other Ambulatory Visit: Payer: Self-pay

## 2015-08-25 DIAGNOSIS — Z1231 Encounter for screening mammogram for malignant neoplasm of breast: Secondary | ICD-10-CM

## 2015-08-26 ENCOUNTER — Ambulatory Visit: Payer: Commercial Managed Care - HMO | Admitting: Physician Assistant

## 2015-08-26 NOTE — Telephone Encounter (Signed)
Medication refilled per protocol. 

## 2015-09-01 ENCOUNTER — Ambulatory Visit
Admission: RE | Admit: 2015-09-01 | Discharge: 2015-09-01 | Disposition: A | Discharge status: Home / Self Care | Payer: Commercial Managed Care - HMO | Source: Ambulatory Visit

## 2015-09-01 DIAGNOSIS — Z1231 Encounter for screening mammogram for malignant neoplasm of breast: Secondary | ICD-10-CM | POA: Diagnosis not present

## 2015-09-03 ENCOUNTER — Ambulatory Visit: Payer: Commercial Managed Care - HMO

## 2015-09-06 ENCOUNTER — Other Ambulatory Visit: Payer: Self-pay | Admitting: Physician Assistant

## 2015-09-06 DIAGNOSIS — R928 Other abnormal and inconclusive findings on diagnostic imaging of breast: Secondary | ICD-10-CM

## 2015-09-15 ENCOUNTER — Other Ambulatory Visit: Payer: Commercial Managed Care - HMO

## 2015-09-20 ENCOUNTER — Ambulatory Visit
Admission: RE | Admit: 2015-09-20 | Discharge: 2015-09-20 | Disposition: A | Discharge status: Home / Self Care | Payer: Commercial Managed Care - HMO | Source: Ambulatory Visit | Attending: Physician Assistant | Admitting: Physician Assistant

## 2015-09-20 ENCOUNTER — Other Ambulatory Visit: Payer: Self-pay | Admitting: Physician Assistant

## 2015-09-20 DIAGNOSIS — R928 Other abnormal and inconclusive findings on diagnostic imaging of breast: Secondary | ICD-10-CM

## 2015-09-20 DIAGNOSIS — N6310 Unspecified lump in the right breast, unspecified quadrant: Secondary | ICD-10-CM

## 2015-09-20 DIAGNOSIS — N63 Unspecified lump in breast: Secondary | ICD-10-CM | POA: Diagnosis not present

## 2015-09-22 ENCOUNTER — Other Ambulatory Visit: Payer: Commercial Managed Care - HMO

## 2015-09-23 ENCOUNTER — Other Ambulatory Visit: Payer: Self-pay | Admitting: Physician Assistant

## 2015-09-23 NOTE — Telephone Encounter (Signed)
Refill appropriate and filled per protocol. 

## 2015-09-29 ENCOUNTER — Ambulatory Visit
Admission: RE | Admit: 2015-09-29 | Discharge: 2015-09-29 | Disposition: A | Discharge status: Home / Self Care | Payer: Commercial Managed Care - HMO | Source: Ambulatory Visit | Attending: Physician Assistant | Admitting: Physician Assistant

## 2015-09-29 ENCOUNTER — Other Ambulatory Visit: Payer: Self-pay | Admitting: Physician Assistant

## 2015-09-29 DIAGNOSIS — D241 Benign neoplasm of right breast: Secondary | ICD-10-CM | POA: Diagnosis not present

## 2015-09-29 DIAGNOSIS — N6310 Unspecified lump in the right breast, unspecified quadrant: Secondary | ICD-10-CM

## 2015-09-29 DIAGNOSIS — N63 Unspecified lump in breast: Secondary | ICD-10-CM | POA: Diagnosis not present

## 2015-09-29 HISTORY — PX: BREAST BIOPSY: SHX20

## 2015-10-04 ENCOUNTER — Other Ambulatory Visit: Payer: Self-pay | Admitting: General Surgery

## 2015-10-04 DIAGNOSIS — D241 Benign neoplasm of right breast: Secondary | ICD-10-CM

## 2015-10-25 ENCOUNTER — Other Ambulatory Visit: Payer: Self-pay | Admitting: General Surgery

## 2015-10-25 DIAGNOSIS — D241 Benign neoplasm of right breast: Secondary | ICD-10-CM

## 2015-10-28 ENCOUNTER — Encounter (HOSPITAL_BASED_OUTPATIENT_CLINIC_OR_DEPARTMENT_OTHER): Payer: Self-pay | Admitting: *Deleted

## 2015-10-29 ENCOUNTER — Other Ambulatory Visit: Payer: Self-pay

## 2015-10-29 ENCOUNTER — Encounter (HOSPITAL_BASED_OUTPATIENT_CLINIC_OR_DEPARTMENT_OTHER)
Admission: RE | Admit: 2015-10-29 | Discharge: 2015-10-29 | Disposition: A | Discharge status: Home / Self Care | Payer: Commercial Managed Care - HMO | Source: Ambulatory Visit | Attending: General Surgery | Admitting: General Surgery

## 2015-10-29 DIAGNOSIS — Z01810 Encounter for preprocedural cardiovascular examination: Secondary | ICD-10-CM | POA: Diagnosis not present

## 2015-10-29 DIAGNOSIS — I10 Essential (primary) hypertension: Secondary | ICD-10-CM | POA: Diagnosis not present

## 2015-10-29 DIAGNOSIS — E119 Type 2 diabetes mellitus without complications: Secondary | ICD-10-CM | POA: Diagnosis not present

## 2015-10-29 DIAGNOSIS — Z87891 Personal history of nicotine dependence: Secondary | ICD-10-CM | POA: Diagnosis not present

## 2015-10-29 DIAGNOSIS — Z6834 Body mass index (BMI) 34.0-34.9, adult: Secondary | ICD-10-CM | POA: Diagnosis not present

## 2015-10-29 DIAGNOSIS — E785 Hyperlipidemia, unspecified: Secondary | ICD-10-CM | POA: Diagnosis not present

## 2015-10-29 DIAGNOSIS — E669 Obesity, unspecified: Secondary | ICD-10-CM | POA: Diagnosis not present

## 2015-10-29 DIAGNOSIS — D241 Benign neoplasm of right breast: Secondary | ICD-10-CM | POA: Diagnosis not present

## 2015-10-29 DIAGNOSIS — Z79899 Other long term (current) drug therapy: Secondary | ICD-10-CM | POA: Diagnosis not present

## 2015-10-29 LAB — BASIC METABOLIC PANEL
ANION GAP: 8 (ref 5–15)
BUN: 18 mg/dL (ref 6–20)
CALCIUM: 9.8 mg/dL (ref 8.9–10.3)
CO2: 26 mmol/L (ref 22–32)
Chloride: 107 mmol/L (ref 101–111)
Creatinine, Ser: 1.21 mg/dL — ABNORMAL HIGH (ref 0.44–1.00)
GFR, EST AFRICAN AMERICAN: 52 mL/min — AB (ref >60)
GFR, EST NON AFRICAN AMERICAN: 45 mL/min — AB (ref >60)
GLUCOSE: 129 mg/dL — AB (ref 65–99)
POTASSIUM: 4.3 mmol/L (ref 3.5–5.1)
Sodium: 141 mmol/L (ref 135–145)

## 2015-10-29 NOTE — Pre-Procedure Instructions (Signed)
Pt in for PAT appt, labs and EKG done. Boost Breeze given to pt and instructions reviewed.

## 2015-11-03 ENCOUNTER — Ambulatory Visit
Admission: RE | Admit: 2015-11-03 | Discharge: 2015-11-03 | Disposition: A | Discharge status: Home / Self Care | Payer: Commercial Managed Care - HMO | Source: Ambulatory Visit | Attending: General Surgery | Admitting: General Surgery

## 2015-11-03 DIAGNOSIS — D241 Benign neoplasm of right breast: Secondary | ICD-10-CM | POA: Diagnosis not present

## 2015-11-04 ENCOUNTER — Encounter (HOSPITAL_BASED_OUTPATIENT_CLINIC_OR_DEPARTMENT_OTHER)
Admission: RE | Disposition: A | Discharge status: Home / Self Care | Payer: Self-pay | Source: Ambulatory Visit | Attending: General Surgery

## 2015-11-04 ENCOUNTER — Ambulatory Visit
Admission: RE | Admit: 2015-11-04 | Discharge: 2015-11-04 | Disposition: A | Discharge status: Home / Self Care | Payer: Commercial Managed Care - HMO | Source: Ambulatory Visit | Attending: General Surgery | Admitting: General Surgery

## 2015-11-04 ENCOUNTER — Ambulatory Visit (HOSPITAL_BASED_OUTPATIENT_CLINIC_OR_DEPARTMENT_OTHER): Payer: Commercial Managed Care - HMO | Admitting: Anesthesiology

## 2015-11-04 ENCOUNTER — Encounter (HOSPITAL_BASED_OUTPATIENT_CLINIC_OR_DEPARTMENT_OTHER): Payer: Self-pay | Admitting: Anesthesiology

## 2015-11-04 ENCOUNTER — Ambulatory Visit (HOSPITAL_BASED_OUTPATIENT_CLINIC_OR_DEPARTMENT_OTHER)
Admission: RE | Admit: 2015-11-04 | Discharge: 2015-11-04 | Disposition: A | Discharge status: Home / Self Care | Payer: Commercial Managed Care - HMO | Source: Ambulatory Visit | Attending: General Surgery | Admitting: General Surgery

## 2015-11-04 DIAGNOSIS — E785 Hyperlipidemia, unspecified: Secondary | ICD-10-CM | POA: Diagnosis not present

## 2015-11-04 DIAGNOSIS — E119 Type 2 diabetes mellitus without complications: Secondary | ICD-10-CM | POA: Diagnosis not present

## 2015-11-04 DIAGNOSIS — D241 Benign neoplasm of right breast: Secondary | ICD-10-CM | POA: Diagnosis not present

## 2015-11-04 DIAGNOSIS — I10 Essential (primary) hypertension: Secondary | ICD-10-CM | POA: Diagnosis not present

## 2015-11-04 DIAGNOSIS — Z87891 Personal history of nicotine dependence: Secondary | ICD-10-CM | POA: Insufficient documentation

## 2015-11-04 DIAGNOSIS — R928 Other abnormal and inconclusive findings on diagnostic imaging of breast: Secondary | ICD-10-CM | POA: Diagnosis not present

## 2015-11-04 DIAGNOSIS — Z79899 Other long term (current) drug therapy: Secondary | ICD-10-CM | POA: Diagnosis not present

## 2015-11-04 DIAGNOSIS — N6011 Diffuse cystic mastopathy of right breast: Secondary | ICD-10-CM | POA: Diagnosis not present

## 2015-11-04 DIAGNOSIS — Z6834 Body mass index (BMI) 34.0-34.9, adult: Secondary | ICD-10-CM | POA: Diagnosis not present

## 2015-11-04 DIAGNOSIS — E669 Obesity, unspecified: Secondary | ICD-10-CM | POA: Insufficient documentation

## 2015-11-04 HISTORY — PX: BREAST LUMPECTOMY WITH RADIOACTIVE SEED LOCALIZATION: SHX6424

## 2015-11-04 HISTORY — PX: BREAST EXCISIONAL BIOPSY: SUR124

## 2015-11-04 SURGERY — BREAST LUMPECTOMY WITH RADIOACTIVE SEED LOCALIZATION
Anesthesia: General | Site: Breast | Laterality: Right

## 2015-11-04 MED ORDER — FENTANYL CITRATE (PF) 100 MCG/2ML IJ SOLN
50.0000 ug | INTRAMUSCULAR | Status: DC | PRN
  Administered 2015-11-04: 100 ug via INTRAVENOUS

## 2015-11-04 MED ORDER — SCOPOLAMINE 1 MG/3DAYS TD PT72: 1.0000 | MEDICATED_PATCH | Freq: Once | TRANSDERMAL | Status: DC | PRN

## 2015-11-04 MED ORDER — CHLORHEXIDINE GLUCONATE 4 % EX LIQD: 1.0000 "application " | Freq: Once | CUTANEOUS | Status: DC

## 2015-11-04 MED ORDER — PROPOFOL 500 MG/50ML IV EMUL
INTRAVENOUS | Status: AC
  Filled 2015-11-04: qty 50

## 2015-11-04 MED ORDER — BUPIVACAINE HCL (PF) 0.25 % IJ SOLN
INTRAMUSCULAR | Status: DC | PRN
  Administered 2015-11-04: 18 mL

## 2015-11-04 MED ORDER — MIDAZOLAM HCL 2 MG/2ML IJ SOLN: 1.0000 mg | INTRAMUSCULAR | Status: DC | PRN

## 2015-11-04 MED ORDER — HYDROCODONE-ACETAMINOPHEN 7.5-325 MG PO TABS: 1.0000 | ORAL_TABLET | Freq: Once | ORAL | Status: DC | PRN

## 2015-11-04 MED ORDER — ONDANSETRON HCL 4 MG/2ML IJ SOLN
INTRAMUSCULAR | Status: AC
  Filled 2015-11-04: qty 2

## 2015-11-04 MED ORDER — GLYCOPYRROLATE 0.2 MG/ML IJ SOLN: 0.2000 mg | Freq: Once | INTRAMUSCULAR | Status: DC | PRN

## 2015-11-04 MED ORDER — DEXAMETHASONE SODIUM PHOSPHATE 4 MG/ML IJ SOLN
INTRAMUSCULAR | Status: DC | PRN
  Administered 2015-11-04: 10 mg via INTRAVENOUS

## 2015-11-04 MED ORDER — DEXAMETHASONE SODIUM PHOSPHATE 10 MG/ML IJ SOLN
INTRAMUSCULAR | Status: AC
  Filled 2015-11-04: qty 1

## 2015-11-04 MED ORDER — LIDOCAINE 2% (20 MG/ML) 5 ML SYRINGE
INTRAMUSCULAR | Status: DC | PRN
  Administered 2015-11-04: 100 mg via INTRAVENOUS

## 2015-11-04 MED ORDER — SUCCINYLCHOLINE CHLORIDE 200 MG/10ML IV SOSY
PREFILLED_SYRINGE | INTRAVENOUS | Status: AC
  Filled 2015-11-04: qty 10

## 2015-11-04 MED ORDER — LIDOCAINE 2% (20 MG/ML) 5 ML SYRINGE
INTRAMUSCULAR | Status: AC
  Filled 2015-11-04: qty 5

## 2015-11-04 MED ORDER — FENTANYL CITRATE (PF) 100 MCG/2ML IJ SOLN
INTRAMUSCULAR | Status: AC
  Filled 2015-11-04: qty 2

## 2015-11-04 MED ORDER — PROPOFOL 10 MG/ML IV BOLUS
INTRAVENOUS | Status: DC | PRN
  Administered 2015-11-04: 200 mg via INTRAVENOUS

## 2015-11-04 MED ORDER — HYDROCODONE-ACETAMINOPHEN 5-325 MG PO TABS: 1.0000 | ORAL_TABLET | Freq: Four times a day (QID) | ORAL | Status: DC | PRN

## 2015-11-04 MED ORDER — CEFAZOLIN SODIUM-DEXTROSE 2-4 GM/100ML-% IV SOLN
2.0000 g | INTRAVENOUS | Status: AC
  Administered 2015-11-04: 2 g via INTRAVENOUS

## 2015-11-04 MED ORDER — CEFAZOLIN SODIUM-DEXTROSE 2-4 GM/100ML-% IV SOLN
INTRAVENOUS | Status: AC
  Filled 2015-11-04: qty 100

## 2015-11-04 MED ORDER — LACTATED RINGERS IV SOLN
INTRAVENOUS | Status: DC
  Administered 2015-11-04 (×2): via INTRAVENOUS

## 2015-11-04 MED ORDER — ONDANSETRON HCL 4 MG/2ML IJ SOLN
INTRAMUSCULAR | Status: DC | PRN
  Administered 2015-11-04: 4 mg via INTRAVENOUS

## 2015-11-04 SURGICAL SUPPLY — 45 items
APPLIER CLIP 9.375 MED OPEN (MISCELLANEOUS)
BLADE SURG 15 STRL LF DISP TIS (BLADE) ×1 IMPLANT
BLADE SURG 15 STRL SS (BLADE) ×2
CANISTER SUC SOCK COL 7IN (MISCELLANEOUS) IMPLANT
CANISTER SUCT 1200ML W/VALVE (MISCELLANEOUS) IMPLANT
CHLORAPREP W/TINT 26ML (MISCELLANEOUS) ×3 IMPLANT
CLIP APPLIE 9.375 MED OPEN (MISCELLANEOUS) IMPLANT
COVER BACK TABLE 60X90IN (DRAPES) ×3 IMPLANT
COVER MAYO STAND STRL (DRAPES) ×3 IMPLANT
COVER PROBE W GEL 5X96 (DRAPES) ×3 IMPLANT
DECANTER SPIKE VIAL GLASS SM (MISCELLANEOUS) IMPLANT
DEVICE DUBIN W/COMP PLATE 8390 (MISCELLANEOUS) ×3 IMPLANT
DRAPE LAPAROSCOPIC ABDOMINAL (DRAPES) IMPLANT
DRAPE UTILITY XL STRL (DRAPES) ×3 IMPLANT
ELECT COATED BLADE 2.86 ST (ELECTRODE) ×3 IMPLANT
ELECT REM PT RETURN 9FT ADLT (ELECTROSURGICAL) ×3
ELECTRODE REM PT RTRN 9FT ADLT (ELECTROSURGICAL) ×1 IMPLANT
GLOVE BIO SURGEON STRL SZ7.5 (GLOVE) ×6 IMPLANT
GLOVE BIOGEL PI IND STRL 7.0 (GLOVE) ×1 IMPLANT
GLOVE BIOGEL PI IND STRL 7.5 (GLOVE) ×1 IMPLANT
GLOVE BIOGEL PI INDICATOR 7.0 (GLOVE) ×2
GLOVE BIOGEL PI INDICATOR 7.5 (GLOVE) ×2
GLOVE SURG SS PI 6.5 STRL IVOR (GLOVE) ×3 IMPLANT
GLOVE SURG SS PI 7.0 STRL IVOR (GLOVE) ×6 IMPLANT
GOWN STRL REUS W/ TWL LRG LVL3 (GOWN DISPOSABLE) ×3 IMPLANT
GOWN STRL REUS W/TWL LRG LVL3 (GOWN DISPOSABLE) ×6
ILLUMINATOR WAVEGUIDE N/F (MISCELLANEOUS) IMPLANT
KIT MARKER MARGIN INK (KITS) ×3 IMPLANT
LIGHT WAVEGUIDE WIDE FLAT (MISCELLANEOUS) IMPLANT
LIQUID BAND (GAUZE/BANDAGES/DRESSINGS) ×3 IMPLANT
NEEDLE HYPO 25X1 1.5 SAFETY (NEEDLE) ×3 IMPLANT
NS IRRIG 1000ML POUR BTL (IV SOLUTION) IMPLANT
PACK BASIN DAY SURGERY FS (CUSTOM PROCEDURE TRAY) ×3 IMPLANT
PENCIL BUTTON HOLSTER BLD 10FT (ELECTRODE) ×3 IMPLANT
SLEEVE SCD COMPRESS KNEE MED (MISCELLANEOUS) ×3 IMPLANT
SPONGE LAP 18X18 X RAY DECT (DISPOSABLE) ×3 IMPLANT
SUT MON AB 4-0 PC3 18 (SUTURE) ×3 IMPLANT
SUT SILK 2 0 SH (SUTURE) IMPLANT
SUT VICRYL 3-0 CR8 SH (SUTURE) ×3 IMPLANT
SYR CONTROL 10ML LL (SYRINGE) ×3 IMPLANT
TOWEL OR 17X24 6PK STRL BLUE (TOWEL DISPOSABLE) ×3 IMPLANT
TOWEL OR NON WOVEN STRL DISP B (DISPOSABLE) ×3 IMPLANT
TUBE CONNECTING 20'X1/4 (TUBING)
TUBE CONNECTING 20X1/4 (TUBING) IMPLANT
YANKAUER SUCT BULB TIP NO VENT (SUCTIONS) IMPLANT

## 2015-11-04 NOTE — Anesthesia Procedure Notes (Signed)
Procedure Name: LMA Insertion Date/Time: 11/04/2015 7:34 AM Performed by: Lieutenant Diego Pre-anesthesia Checklist: Patient identified, Emergency Drugs available, Suction available and Patient being monitored Patient Re-evaluated:Patient Re-evaluated prior to inductionOxygen Delivery Method: Circle system utilized Preoxygenation: Pre-oxygenation with 100% oxygen Intubation Type: IV induction Ventilation: Mask ventilation without difficulty LMA: LMA inserted LMA Size: 4.0 Number of attempts: 1 Airway Equipment and Method: Bite block Placement Confirmation: positive ETCO2 and breath sounds checked- equal and bilateral Tube secured with: Tape Dental Injury: Teeth and Oropharynx as per pre-operative assessment

## 2015-11-04 NOTE — Transfer of Care (Signed)
Immediate Anesthesia Transfer of Care Note  Patient: Heather Gross  Procedure(s) Performed: Procedure(s): BREAST LUMPECTOMY WITH RADIOACTIVE SEED LOCALIZATION (Right)  Patient Location: PACU  Anesthesia Type:General  Level of Consciousness: awake and alert   Airway & Oxygen Therapy: Patient Spontanous Breathing and Patient connected to face mask oxygen  Post-op Assessment: Report given to RN and Post -op Vital signs reviewed and stable  Post vital signs: Reviewed and stable  Last Vitals:  Filed Vitals:   11/04/15 0638  BP: 136/66  Pulse: 74  Temp: 36.6 C  Resp: 20    Last Pain: There were no vitals filed for this visit.       Complications: No apparent anesthesia complications

## 2015-11-04 NOTE — Op Note (Signed)
11/04/2015  8:18 AM  PATIENT:  Heather Gross  70 y.o. female  PRE-OPERATIVE DIAGNOSIS:  RIGHT BREAST PAPILLOMA  POST-OPERATIVE DIAGNOSIS:  RIGHT BREAST PAPILLOMA  PROCEDURE:  Procedure(s): BREAST LUMPECTOMY WITH RADIOACTIVE SEED LOCALIZATION (Right)  SURGEON:  Surgeon(s) and Role:    * Jovita Kussmaul, MD - Primary  PHYSICIAN ASSISTANT:   ASSISTANTS: none   ANESTHESIA:   local and general  EBL:  Total I/O In: 1000 [I.V.:1000] Out: 10 [Blood:10]  BLOOD ADMINISTERED:none  DRAINS: none   LOCAL MEDICATIONS USED:  MARCAINE     SPECIMEN:  Source of Specimen:  right breast tissue  DISPOSITION OF SPECIMEN:  PATHOLOGY  COUNTS:  YES  TOURNIQUET:  * No tourniquets in log *  DICTATION: .Dragon Dictation   After informed consent was obtained the patient was brought to the operating room and placed in the supine position on the operating room table.  After adequate induction of general anesthesia the patient's right breast was prepped with ChloraPrep, allowed to dry, and draped in usual sterile manner. An appropriate timeout was performed. The neoprobe was set to I-125 in the area of radioactivity was readily identified in the outer aspect of the subareolar area.  A curvilinear incision was then made along the lateral aspect of the right areola  With a 15 blade knife. The incision was carried through the skin and subcutaneous tissue sharply with the electrocautery.  While checking the area of radioactivity frequently with the neoprobe a circular portion of breast tissue was excised sharply around the radioactive seed. At one point we did encounter the radioactive seed free in the lumpectomy cavity. The seed was removed and placed in a separate container and x-rayed for confirmation. Once the breast tissue was removed it was oriented with the appropriate paint colors. A specimen radiograph was obtained that showed the clip to be within the specimen. The specimen was then sent to pathology  for further evaluation. Hemostasis was achieved using the Bovie electrocautery. The wound was infiltrated with quarter percent Marcaine and irrigated with saline. The deep layer of the wound was then closed with layers of interrupted 3-0 Vicryl stitches. The skin was then closed with interrupted 4-0 Monocryl subcuticular stitches. Dermabond dressings were applied. The patient tolerated the procedure well. At the end of the case all needle sponge and instrument counts were correct.  The patient was then awakened and taken to recovery in stable condition.  PLAN OF CARE: Discharge to home after PACU  PATIENT DISPOSITION:  PACU - hemodynamically stable.   Delay start of Pharmacological VTE agent (>24hrs) due to surgical blood loss or risk of bleeding: not applicable

## 2015-11-04 NOTE — Discharge Instructions (Signed)

## 2015-11-04 NOTE — H&P (Signed)
Heather Gross  Location: [facility] Patient #: [phone] DOB: 06/12/1945 Married / Language: English / Race: Black or African American Female   History of Present Illness  The patient is a 70 year old female who presents with a breast mass. We are asked to see the patient in consultation by Dr. Jetta Lout to evaluate her for a papilloma of the right breast. The patient is a 70 year old black female who recently went for a routine screening mammogram. At that time she was found to have an abnormality in the upper outer right breast. This measured 1.2 cm. It was biopsied and came back as an intraductal papilloma. She denies any breast pain or discharge from the nipple.   Past Surgical History  Breast Biopsy Right.  Diagnostic Studies History Mammogram within last year  Allergies  Latex Exam Gloves *MEDICAL DEVICES AND SUPPLIES* Atorvastatin Calcium *ANTIHYPERLIPIDEMICS*  Medication History  AmLODIPine Besylate (10MG  Tablet, Oral) Active. HydroCHLOROthiazide (12.5MG  Capsule, Oral) Active. Simvastatin (40MG  Tablet, Oral) Active. Medications Reconciled  Social History Caffeine use Coffee. No alcohol use No drug use Tobacco use Former smoker.  Family History Hypertension Father.  Pregnancy / Birth History  Age at menarche 79 years. Age of menopause 28-50 Gravida 2 Maternal age 50-20 Para 2    Review of Systems General Not Present- Appetite Loss, Chills, Fatigue, Fever, Night Sweats, Weight Gain and Weight Loss. Skin Not Present- Change in Wart/Mole, Dryness, Hives, Jaundice, New Lesions, Non-Healing Wounds, Rash and Ulcer. HEENT Not Present- Earache, Hearing Loss, Hoarseness, Nose Bleed, Oral Ulcers, Ringing in the Ears, Seasonal Allergies, Sinus Pain, Sore Throat, Visual Disturbances, Wears glasses/contact lenses and Yellow Eyes. Respiratory Not Present- Bloody sputum, Chronic Cough, Difficulty Breathing, Snoring and Wheezing. Breast  Not Present- Breast Mass, Breast Pain, Nipple Discharge and Skin Changes. Cardiovascular Not Present- Chest Pain, Difficulty Breathing Lying Down, Leg Cramps, Palpitations, Rapid Heart Rate, Shortness of Breath and Swelling of Extremities. Gastrointestinal Not Present- Abdominal Pain, Bloating, Bloody Stool, Change in Bowel Habits, Chronic diarrhea, Constipation, Difficulty Swallowing, Excessive gas, Gets full quickly at meals, Hemorrhoids, Indigestion, Nausea, Rectal Pain and Vomiting. Female Genitourinary Not Present- Frequency, Nocturia, Painful Urination, Pelvic Pain and Urgency. Musculoskeletal Not Present- Back Pain, Joint Pain, Joint Stiffness, Muscle Pain, Muscle Weakness and Swelling of Extremities. Neurological Not Present- Decreased Memory, Fainting, Headaches, Numbness, Seizures, Tingling, Tremor, Trouble walking and Weakness. Psychiatric Not Present- Anxiety, Bipolar, Change in Sleep Pattern, Depression, Fearful and Frequent crying. Endocrine Not Present- Cold Intolerance, Excessive Hunger, Hair Changes, Heat Intolerance, Hot flashes and New Diabetes. Hematology Not Present- Easy Bruising, Excessive bleeding, Gland problems, HIV and Persistent Infections.  Vitals  Weight: 209 lb Height: 65in Body Surface Area: 2.02 m Body Mass Index: 34.78 kg/m  Temp.: 97.66F(Temporal)  Pulse: 77 (Regular)  BP: 126/76 (Sitting, Left Arm, Standard)       Physical Exam  General Mental Status-Alert. General Appearance-Consistent with stated age. Hydration-Well hydrated. Voice-Normal.  Head and Neck Head-normocephalic, atraumatic with no lesions or palpable masses. Trachea-midline. Thyroid Gland Characteristics - normal size and consistency.  Eye Eyeball - Bilateral-Extraocular movements intact. Sclera/Conjunctiva - Bilateral-No scleral icterus.  Chest and Lung Exam Chest and lung exam reveals -quiet, even and easy respiratory effort with no use of  accessory muscles and on auscultation, normal breath sounds, no adventitious sounds and normal vocal resonance. Inspection Chest Wall - Normal. Back - normal.  Breast Note: There is no palpable mass in either breast. There is no palpable axillary, supraclavicular, or cervical lymphadenopathy.   Cardiovascular  Cardiovascular examination reveals -normal heart sounds, regular rate and rhythm with no murmurs and normal pedal pulses bilaterally.  Abdomen Inspection Inspection of the abdomen reveals - No Hernias. Skin - Scar - no surgical scars. Palpation/Percussion Palpation and Percussion of the abdomen reveal - Soft, Non Tender, No Rebound tenderness, No Rigidity (guarding) and No hepatosplenomegaly. Auscultation Auscultation of the abdomen reveals - Bowel sounds normal.  Neurologic Neurologic evaluation reveals -alert and oriented x 3 with no impairment of recent or remote memory. Mental Status-Normal.  Musculoskeletal Normal Exam - Left-Upper Extremity Strength Normal and Lower Extremity Strength Normal. Normal Exam - Right-Upper Extremity Strength Normal and Lower Extremity Strength Normal.  Lymphatic Head & Neck  General Head & Neck Lymphatics: Bilateral - Description - Normal. Axillary  General Axillary Region: Bilateral - Description - Normal. Tenderness - Non Tender. Femoral & Inguinal  Generalized Femoral & Inguinal Lymphatics: Bilateral - Description - Normal. Tenderness - Non Tender.    Assessment & Plan INTRADUCTAL PAPILLOMA OF BREAST, RIGHT (D24.1) Impression: The patient appears to have an intraductal papilloma in the upper outer portion of the right breast. Because of its abnormal appearance on mammogram and because of a possibility of increased risk of breast cancer I think she would benefit from having this area removed. She would also like to have this done. I have discussed with her in detail the risks and benefits of the operation remove this area  as well as some of the technical aspects and she understands and wishes to proceed. I will plan for a right breast radioactive seed localized lumpectomy Current Plans Pt Education - Breast Diseases: discussed with patient and provided information.

## 2015-11-04 NOTE — Interval H&P Note (Signed)
History and Physical Interval Note:  11/04/2015 7:15 AM  Heather Gross  has presented today for surgery, with the diagnosis of RIGHT BREAST PAPILLOMA  The various methods of treatment have been discussed with the patient and family. After consideration of risks, benefits and other options for treatment, the patient has consented to  Procedure(s): BREAST LUMPECTOMY WITH RADIOACTIVE SEED LOCALIZATION (Right) as a surgical intervention .  The patient's history has been reviewed, patient examined, no change in status, stable for surgery.  I have reviewed the patient's chart and labs.  Questions were answered to the patient's satisfaction.     TOTH III,PAUL S

## 2015-11-04 NOTE — Anesthesia Preprocedure Evaluation (Addendum)
Anesthesia Evaluation  Patient identified by MRN, date of birth, ID band Patient awake    Reviewed: Allergy & Precautions, NPO status , Patient's Chart, lab work & pertinent test results  Airway Mallampati: I  TM Distance: >3 FB Neck ROM: Full    Dental  (+) Partial Lower   Pulmonary former smoker,    Pulmonary exam normal breath sounds clear to auscultation       Cardiovascular hypertension, Pt. on medications Normal cardiovascular exam Rhythm:Regular Rate:Normal     Neuro/Psych Glaucoma negative psych ROS   GI/Hepatic Neg liver ROS, GERD  Medicated and Controlled,  Endo/Other  diabetes, Well Controlled, Type 2Obesity Hyperlipidemia Papilloma right breast  Renal/GU Renal InsufficiencyRenal disease  negative genitourinary   Musculoskeletal negative musculoskeletal ROS (+)   Abdominal (+) + obese,   Peds  Hematology   Anesthesia Other Findings   Reproductive/Obstetrics                            Anesthesia Physical Anesthesia Plan  ASA: II  Anesthesia Plan: General   Post-op Pain Management:    Induction: Intravenous  Airway Management Planned: LMA  Additional Equipment:   Intra-op Plan:   Post-operative Plan: Extubation in OR  Informed Consent: I have reviewed the patients History and Physical, chart, labs and discussed the procedure including the risks, benefits and alternatives for the proposed anesthesia with the patient or authorized representative who has indicated his/her understanding and acceptance.   Dental advisory given  Plan Discussed with: Anesthesiologist, CRNA and Surgeon  Anesthesia Plan Comments:         Anesthesia Quick Evaluation

## 2015-11-04 NOTE — Anesthesia Postprocedure Evaluation (Signed)
Anesthesia Post Note  Patient: Heather Gross  Procedure(s) Performed: Procedure(s) (LRB): BREAST LUMPECTOMY WITH RADIOACTIVE SEED LOCALIZATION (Right)  Patient location during evaluation: PACU Anesthesia Type: General Level of consciousness: awake and alert and oriented Pain management: pain level controlled Vital Signs Assessment: post-procedure vital signs reviewed and stable Respiratory status: spontaneous breathing, nonlabored ventilation and respiratory function stable Cardiovascular status: blood pressure returned to baseline and stable Postop Assessment: no signs of nausea or vomiting Anesthetic complications: no    Last Vitals:  Filed Vitals:   11/04/15 0900 11/04/15 0915  BP: 148/83 146/64  Pulse: 67 66  Temp:  36.5 C  Resp: 16 16    Last Pain:  Filed Vitals:   11/04/15 0918  PainSc: 0-No pain                 Keng Jewel A.

## 2015-11-11 ENCOUNTER — Encounter (HOSPITAL_BASED_OUTPATIENT_CLINIC_OR_DEPARTMENT_OTHER): Payer: Self-pay | Admitting: General Surgery

## 2015-11-13 ENCOUNTER — Other Ambulatory Visit: Payer: Self-pay | Admitting: Physician Assistant

## 2015-11-15 ENCOUNTER — Encounter: Payer: Self-pay | Admitting: Family Medicine

## 2015-11-15 NOTE — Telephone Encounter (Signed)
Medication refill for one time only.  Patient needs to be seen.  Letter sent for patient to call and schedule 

## 2015-12-06 ENCOUNTER — Other Ambulatory Visit: Payer: Self-pay | Admitting: Family Medicine

## 2015-12-06 MED ORDER — SIMVASTATIN 40 MG PO TABS
40.0000 mg | ORAL_TABLET | Freq: Every day | ORAL | 0 refills | Status: DC
Start: 2015-12-06 — End: 2016-03-24

## 2015-12-06 NOTE — Telephone Encounter (Signed)
Medication refilled per protocol. 

## 2015-12-16 ENCOUNTER — Encounter: Payer: Self-pay | Admitting: Physician Assistant

## 2015-12-16 ENCOUNTER — Ambulatory Visit (INDEPENDENT_AMBULATORY_CARE_PROVIDER_SITE_OTHER): Payer: Commercial Managed Care - HMO | Admitting: Physician Assistant

## 2015-12-16 VITALS — BP 123/64 | HR 83 | Temp 98.5°F | Resp 16 | Ht 65.0 in | Wt 208.0 lb

## 2015-12-16 DIAGNOSIS — E785 Hyperlipidemia, unspecified: Secondary | ICD-10-CM | POA: Diagnosis not present

## 2015-12-16 DIAGNOSIS — E119 Type 2 diabetes mellitus without complications: Secondary | ICD-10-CM | POA: Diagnosis not present

## 2015-12-16 DIAGNOSIS — K219 Gastro-esophageal reflux disease without esophagitis: Secondary | ICD-10-CM | POA: Diagnosis not present

## 2015-12-16 DIAGNOSIS — I10 Essential (primary) hypertension: Secondary | ICD-10-CM

## 2015-12-16 DIAGNOSIS — E559 Vitamin D deficiency, unspecified: Secondary | ICD-10-CM | POA: Diagnosis not present

## 2015-12-16 DIAGNOSIS — H409 Unspecified glaucoma: Secondary | ICD-10-CM | POA: Diagnosis not present

## 2015-12-16 LAB — COMPLETE METABOLIC PANEL WITH GFR
ALT: 17 U/L (ref 6–29)
AST: 26 U/L (ref 10–35)
Albumin: 4.3 g/dL (ref 3.6–5.1)
Alkaline Phosphatase: 60 U/L (ref 33–130)
BILIRUBIN TOTAL: 0.6 mg/dL (ref 0.2–1.2)
BUN: 21 mg/dL (ref 7–25)
CALCIUM: 10 mg/dL (ref 8.6–10.4)
CHLORIDE: 102 mmol/L (ref 98–110)
CO2: 24 mmol/L (ref 20–31)
CREATININE: 1.18 mg/dL — AB (ref 0.50–0.99)
GFR, EST AFRICAN AMERICAN: 54 mL/min — AB (ref >=60)
GFR, EST NON AFRICAN AMERICAN: 47 mL/min — AB (ref >=60)
Glucose, Bld: 121 mg/dL — ABNORMAL HIGH (ref 70–99)
Potassium: 4.1 mmol/L (ref 3.5–5.3)
Sodium: 139 mmol/L (ref 135–146)
TOTAL PROTEIN: 7.3 g/dL (ref 6.1–8.1)

## 2015-12-16 LAB — LIPID PANEL
CHOLESTEROL: 167 mg/dL (ref 125–200)
HDL: 46 mg/dL (ref >=46)
LDL Cholesterol: 96 mg/dL (ref <130)
Total CHOL/HDL Ratio: 3.6 Ratio (ref <=5.0)
Triglycerides: 123 mg/dL (ref <150)
VLDL: 25 mg/dL (ref <30)

## 2015-12-16 LAB — HEMOGLOBIN A1C
HEMOGLOBIN A1C: 6.3 % — AB (ref <5.7)
MEAN PLASMA GLUCOSE: 134 mg/dL

## 2015-12-16 MED ORDER — AMLODIPINE BESYLATE 5 MG PO TABS: 5.0000 mg | ORAL_TABLET | Freq: Every day | ORAL | 3 refills | Status: DC

## 2015-12-16 MED ORDER — OMEPRAZOLE 20 MG PO CPDR: 20.0000 mg | DELAYED_RELEASE_CAPSULE | Freq: Every day | ORAL | 3 refills | Status: DC

## 2015-12-16 MED ORDER — LISINOPRIL 10 MG PO TABS: 10.0000 mg | ORAL_TABLET | Freq: Every day | ORAL | 0 refills | Status: DC

## 2015-12-16 MED ORDER — ASPIRIN EC 81 MG PO TBEC: 81.0000 mg | DELAYED_RELEASE_TABLET | Freq: Every day | ORAL | 11 refills | Status: DC

## 2015-12-16 NOTE — Progress Notes (Signed)
Patient ID: CHANAH OFALLON MRN: [id number], DOB: 09-28-1945, 70 y.o. Date of Encounter: 12/16/2015,   Chief Complaint: Routine follow-up office visit and lab work.  HPI: 70 y.o. y/o female  here for routine follow-up office visit and lab work.  She was seen on 06/11/2014 as a "new patient"  to reestablish care here. Her last office visit here prior to that was 12/2010.  She said that since she was coming here she has gone to the [facility]. Said that they had been prescribing her 2 blood pressure medications. Said that she had not been diagnosed with any other new medical problems since her visit here.  At that visit 06/11/2014 we did complete physical exam. Did full panel a screening lab work. Updated preventative care.  Labs revealed hyperlipidemia with LDL 169. Patient then started simvastatin 40 mg. Had repeat lipid panel 07/24/14 which showed LDL 106 and LFTs normal. She continues on simvastatin 40 mg. She is having no myalgias and no other adverse effects.  Labs also revealed A1c 6.7. Planned that I would review carbohydrate handout with her follow-up visit----has been given and reviewed in detail at visit 12/10/2014. Pt seems very interested/motivated in making changes.  Labs also revealed vitamin D deficiency at 64. Recommended she start 2000 units daily. At follow-up office visit 12/10/2014 she states that yes she is taking 2000 units daily of vitamin D.  She continues to do her exercise. She exercises 4 days a week. Goes to a  "seniors exercise class"--- says that this is mostly stretching type exercises. Says that she does go to the facility early and walks the track for 15 minutes prior to the class. No lower extremity claudication symptoms with this and no angina symptoms with this exertion.  OV 02/24/2015: She says that she came in for visit today because her pharmacy kept telling her that they could not refill medicines and that she had to have office visit  to get refills. However when our staff reviewed her chart we did not see that we had gotten any refill request and that she just had office visit here 12/10/14 and it hasn't even been a full 3 months since last visit. Nonetheless patient presents here for follow-up visit in refills on medicines. I reviewed that at her last visit I gave and reviewed the carbohydrate diet handout. Today she says that she "has cut back on whole lot". Asked her if there were any specific foods that she relies she was consuming too much of that she has really made a big change in.  She says that she really likes bread and has really had to cut that back and then as well and potatoes and rice too". I'll ever I did review her weight--- at the visit 12/10/14 weight was 203. Today 02/24/15 ---202. Would've expected more weight loss than this if she had made significant diet changes and really hadn't eaten that much of these foods prior to the diet changes She has no complaints or concerns today.   12/16/2015: She is still following her low carbohydrate diet. Continues decreased intake of bread potatoes and rice as well as other carbs. She continues to do her same exercise. Goes to "Seniors Exercise Class" 4 days a week. She has glaucoma so she goes to the eye doctor on a routine basis. She states that her last visit there she did inform them of her diabetes and they said they saw no diabetic changes. Today also discussed with her  diabetic foot care and she is aware and educated of this. She is taking blood pressure medications as directed. No lightheadedness or other adverse effects. Taking simvastatin as directed. No myalgias or other adverse effects. She is still taking the vitamin D 2000 units daily. Today's visit she does state that she has been having acid reflux. Says that she notices it mostly at night when she lies down. Says even when she stops eating 3 hours prior to going to bed and only takes her medicine with a  small sip of water prior to going to bed she still feels this reflux at that time. Sometimes during the day. Using Tums and Zantac recently. No other complaints or concerns today.  Review of Systems: Consitutional: No fever, chills, fatigue, night sweats, lymphadenopathy. No significant/unexplained weight changes. Eyes: No visual changes, eye redness, or discharge. ENT/Mouth: No ear pain, sore throat, nasal drainage, or sinus pain. Cardiovascular: No chest pressure,heaviness, tightness or squeezing, even with exertion. No increased shortness of breath or dyspnea on exertion.No palpitations, edema, orthopnea, PND. Respiratory: No cough, hemoptysis, SOB, or wheezing. Gastrointestinal: No anorexia, dysphagia, reflux, pain, nausea, vomiting, hematemesis, diarrhea, constipation, BRBPR, or melena. Breast: No mass, nodules, bulging, or retraction. No skin changes or inflammation. No nipple discharge. No lymphadenopathy. Genitourinary: No dysuria, hematuria, incontinence, vaginal discharge, pruritis, burning, abnormal bleeding, or pain. Musculoskeletal: No decreased ROM, No joint pain or swelling. No significant pain in neck, back, or extremities. Skin: No rash, pruritis, or concerning lesions. Neurological: No headache, dizziness, syncope, seizures, tremors, memory loss, coordination problems, or paresthesias. Psychological: No anxiety, depression, hallucinations, SI/HI. Endocrine: No polydipsia, polyphagia, polyuria, or known diabetes.No increased fatigue. No palpitations/rapid heart rate. No significant/unexplained weight change. All other systems were reviewed and are otherwise negative.  Past Medical History:  Diagnosis Date  . Glaucoma   . Hyperlipidemia   . Hypertension      Past Surgical History:  Procedure Laterality Date  . BREAST LUMPECTOMY WITH RADIOACTIVE SEED LOCALIZATION Right 11/04/2015   Procedure: BREAST LUMPECTOMY WITH RADIOACTIVE SEED LOCALIZATION;  Surgeon: Autumn Messing III, MD;   Location: [facility];  Service: General;  Laterality: Right;  . LIPOMA EXCISION      Home Meds:  Outpatient Medications Prior to Visit  Medication Sig Dispense Refill  . Cholecalciferol (VITAMIN D) 2000 UNITS tablet Take 2,000 Units by mouth daily.    . Multiple Vitamin (MULTIVITAMIN) tablet Take 1 tablet by mouth daily.    . simvastatin (ZOCOR) 40 MG tablet Take 1 tablet (40 mg total) by mouth at bedtime. 90 tablet 0  . amLODipine (NORVASC) 10 MG tablet TAKE 1 TABLET EVERY DAY 90 tablet 0  . hydrochlorothiazide (MICROZIDE) 12.5 MG capsule TAKE 1 CAPSULE EVERY DAY 90 capsule 0  . HYDROcodone-acetaminophen (NORCO) 5-325 MG tablet Take 1-2 tablets by mouth every 6 (six) hours as needed. (Patient not taking: Reported on 12/16/2015) 30 tablet 0   No facility-administered medications prior to visit.     Allergies:  Allergies  Allergen Reactions  . Latex   . Lipitor [Atorvastatin] Other (See Comments)    Made mouth very dry and lips discolored    Social History   Social History  . Marital status: Married    Spouse name: N/A  . Number of children: N/A  . Years of education: N/A   Occupational History  . Not on file.   Social History Main Topics  . Smoking status: Former Smoker    Quit date: 05/01/1990  . Smokeless tobacco:  Never Used  . Alcohol use No  . Drug use: No  . Sexual activity: Not Currently   Other Topics Concern  . Not on file   Social History Narrative   Entered 06/2014:    Married x 48 years.   2 Children--2 sons   She exercises 4 days a week. Goes to a  "seniors exercise class"--- says that this is mostly stretching type exercises.  Says that she does go to the facility early and walks the track for 15 minutes prior to the class. No lower extremity claudication symptoms with this and no angina symptoms with this exertion.  Family History  Problem Relation Age of Onset  . Diabetes Sister   . Diabetes Brother   . Diabetes Sister   . Diabetes  Sister   . Diabetes Sister   . Diabetes Sister     Physical Exam: Blood pressure 123/64, pulse 83, temperature 98.5 F (36.9 C), temperature source Oral, resp. rate 16, height 5\' 5"  (1.651 m), weight 208 lb (94.3 kg), SpO2 98 %., Body mass index is 34.61 kg/m. General: Well developed, well nourished,AA Female. Appears in no acute distress. Neck: Supple. Trachea midline. No thyromegaly. Full ROM. No lymphadenopathy.No Carotid Bruits. Lungs: Clear to auscultation bilaterally without wheezes, rales, or rhonchi. Breathing is of normal effort and unlabored. Cardiovascular: RRR with S1 S2. No murmurs, rubs, or gallops.  Bilaterally, she has Trace-1+ DP pulses and no palpable PT pulses.  Abdomen: Soft, non-tender, non-distended with normoactive bowel sounds. No hepatosplenomegaly or masses. No rebound/guarding. No CVA tenderness. No hernias.  Musculoskeletal: Full range of motion and 5/5 strength throughout.  Extremities without  edema. Skin: Warm and moist without erythema, ecchymosis, wounds, or rash. Diabetic Foot Exam: She does have a callus on the plantar surface of her right foot at the "ball of her foot " Remainder of inspection is normal. Sensation intact. Dorsalis pedis and posterior tibial pulses 2+ throughout. Neuro: A+Ox3. CN II-XII grossly intact. Moves all extremities spontaneously. Full sensation throughout. Normal gait.  Psych:  Responds to questions appropriately with a normal affect.   Assessment/Plan:  70 y.o. y/o female here for  . Hyperglycemia When she was seen here as a patient in the past she had hyperglycemia and newly diagnosed diabetes.  When she came back here as to reestablish care with [place] 06/11/14 A1c was 6.7. At office visit 12/10/14 carbohydrate handout was reviewed with her in detail. In addition to the carbohydrates that she needs to limit, also discussed foods that she can eat. Repeat A1c 12/10/2014.  02/24/15---I reviewed that at her last visit I gave and  reviewed the carbohydrate diet handout. Today she says that she "has cut back on whole lot". Asked her if there were any specific foods that she relies she was consuming too much of that she has really made a big change in.  She says that she really likes bread and has really had to cut that back and then as well and potatoes and rice too". I'll ever I did review her weight--- at the visit 12/10/14 weight was 203. Today 02/24/15 ---202. Would've expected more weight loss than this if she had made significant diet changes and really hadn't eaten that much of these foods prior to the diet changes   Diabetes mellitus without complication When she was seen here as a patient in the past she had hyperglycemia and newly does most diabetes.  When she came back here as to reestablish care with [place] 06/11/14 A1c  was 6.7. At office visit 12/10/14 carbohydrate handout was reviewed with her in detail. In addition to the carbohydrates that she needs to limit, also discussed foods that she can eat. Repeat A1c 12/10/2014.  02/24/15---I reviewed that at her last visit I gave and reviewed the carbohydrate diet handout. Today she says that she "has cut back on whole lot". Asked her if there were any specific foods that she realizes she was consuming too much of that she has really made a big change in.  She says that she really likes bread and has really had to cut that back and then as well and potatoes and rice too". I'll ever I did review her weight--- at the visit 12/10/14 weight was 203. Today 02/24/15 ---202. Would've expected more weight loss than this if she had made significant diet changes and really hadn't eaten that much of these foods prior to the diet changes   - Hemoglobin A1c  She is on Statin.  At [facility] 12/16/2015---Adding ACE Inhibitor---Lisinopril 10mg  po QD So that her blood pressure does not get too low with the addition of ACE inhibitor--- (and for further renal protection-- I am stopping her HCTZ  12.5 mg)--and I'm also decreasing Norvasc from 10 mg to 5 mg  She will return for office visit and lab work in 2 weeks to recheck blood pressure and bmet after medication changes.  At [place] 12/16/2015 also added aspirin 81 mg daily. She has no history of peptic ulcer disease or bleeding disorder.  Microalbumin checked at visit 12/16/15  At visit 12/16/15 discussed the fact that she already has routine eye exams as she has glaucoma. Says her eye doctor is aware that she has diabetes and they told her she had no signs of diabetic retinopathy.  At visit 12/16/15 discussed proper foot care. She is educated about this and aware to avoid any type of wound to the foot.   2. Hyperlipidemia When she was seen here as a patient in the past she had hyperlipidemia.  When she was seen here 06/11/2014 she was not on any medication for this.  06/11/2014 lipid panel showed LDL 169. She then started simvastatin 40 mg. Follow-up lipid panel 07/24/14 showed LDL 106. Continued simvastatin 40 mg. Rechecked  FLP and LFT  12/10/14.----- labs were stable. She was to continue current medication and follow low carbohydrate handout    3. HYPERTENSION, BENIGN SYSTEMIC She is on blood pressure medications at present. Blood pressure is at goal and control today. BMET normal/stable at last check 12/10/2014   4. Vitamin D deficiency - Vit D  25 hydroxy (rtn osteoporosis monitoring) 06/11/14 labs vitamin D was 22. At that time she did start vitamin D 2000 IUs daily. Verified that she is taking this at her visit 12/10/14. Recheck vitamin D level at that visit.  5. Glaucoma At [facility] 06/11/2014:She reports that she has seen an eye doctor and has been told she has glaucoma. At [place] 12/10/2014--reviewed that her last eye exam was July 2015. And I discussed that she needs to schedule annual follow-up eye exam she says that she usually goes every 2 years. Then asked about the glaucoma and she says that she had glaucoma about 10 years ago but she  had treatment with laser surgery to correct that. At [place] 12/16/15 she reports that her eye doctor is aware of her diabetes and that at her last eye exam a told her no signs of diabetic change. She does continue to follow up with her  eye doctor routinely.  6. GERD At office visit 12/16/2015 added omeprazole 20 mg daily. At that visit I also discussed stopping eating and drinking couple hours prior to going to sleep. He states that she already had quit doing all eating and drinking other than taking her nighttime pill with one sip of water and still has reflux when she lies down despite these changes. Also has some reflux symptoms during the day.  THE FOLLOWING IS COPIED FROM HER CPE NOTE 06/11/2014:  Visit for preventive health examination  A. Screening Labs: - CBC with Differential/Platelet - COMPLETE METABOLIC PANEL WITH GFR - Lipid panel - Hemoglobin A1c - TSH - Vit D  25 hydroxy (rtn osteoporosis monitoring) - MM Digital Screening; Future  B. Pap: She reports that she did see gynecology in the past but that she probably last saw them in the 1990s. Is that back then her Pap smears were all normal. She defers further pelvic exams or Pap smears.  C. Screening Mammogram: 06/11/2014: "She reports that she has had no mammogram since the 1990s. She is agreeable for me to schedule follow-up mammogram." She had mammogram 07/08/14 and further evaluation was needed so they did that 07/14/2014. Negative.  D. DEXA/BMD:  06/11/2014: She reports that she has not had a bone density test recently and she is agreeable for me to schedule follow-up bone density test. She had bone density scan 07/08/14. We have already called her regarding those results and gave her instructions. This showed osteopenia. We recommended her continue her weightbearing exercise and also take calcium and vitamin D. Today 12/10/14 she states that she is taking 1500 mg of calcium a day and 2000 units vitamin D daily.  E. Colorectal  Cancer Screening: She reports that she had a colonoscopy around 2007 that was normal and was performed by Dr. Collene Mares ---------------------AT 02/24/15 OV----I HAD HER SIGN RELEASE TO GET RECORD FROM DR Collene Mares-----------------------  F. Immunizations:  Influenza: ----Influenza vaccine given here 06/11/14 Tetanus: ----T dap given here 06/11/14 Pneumococcal:---- Prevnar 13 given here 06/11/14   ------Pneumovax 23--Given here 12/10/2014 Zostavax:  AT [facility] 12/10/2014---I WROTE THIS ON HER AVS TO REMIND HER---TO CHECK COST WITH INSURANCE THEN CALL us ----------------AT [facility] 02/24/15-----he says that she did check on this and it was going to be a $47 co-pay and she is agreeable to pay this price. Today I had chem go ahead and send order to the pharmacy and I told patient to go to the pharmacy and get this. ------------- at [place] 12/16/15 she says that she forgot to ever go get this vaccine. I wrote on her AVS today and reminded her to do so   She is to schedule follow-up office visit 2 weeks. Follow-up sooner if needed.  Marin Olp [place], [place], [facility] 12/16/2015 8:49 AM

## 2015-12-17 LAB — VITAMIN D 25 HYDROXY (VIT D DEFICIENCY, FRACTURES): VIT D 25 HYDROXY: 43 ng/mL (ref 30–100)

## 2015-12-17 LAB — MICROALBUMIN, URINE: Microalb, Ur: 0.3 mg/dL

## 2015-12-30 ENCOUNTER — Encounter: Payer: Self-pay | Admitting: Physician Assistant

## 2015-12-30 ENCOUNTER — Ambulatory Visit (INDEPENDENT_AMBULATORY_CARE_PROVIDER_SITE_OTHER): Payer: Commercial Managed Care - HMO | Admitting: Physician Assistant

## 2015-12-30 VITALS — BP 124/72 | HR 72 | Temp 97.6°F | Wt 209.0 lb

## 2015-12-30 DIAGNOSIS — E119 Type 2 diabetes mellitus without complications: Secondary | ICD-10-CM

## 2015-12-30 DIAGNOSIS — I10 Essential (primary) hypertension: Secondary | ICD-10-CM

## 2015-12-30 DIAGNOSIS — E559 Vitamin D deficiency, unspecified: Secondary | ICD-10-CM

## 2015-12-30 DIAGNOSIS — K219 Gastro-esophageal reflux disease without esophagitis: Secondary | ICD-10-CM

## 2015-12-30 DIAGNOSIS — E785 Hyperlipidemia, unspecified: Secondary | ICD-10-CM | POA: Diagnosis not present

## 2015-12-30 NOTE — Progress Notes (Signed)
Patient ID: Heather Gross MRN: [id number], DOB: 06-04-1945, 70 y.o. Date of Encounter: 12/30/2015,   Chief Complaint: Routine follow-up office visit and lab work.  HPI: 70 y.o. y/o female  here for routine follow-up office visit and lab work.  She was seen on 06/11/2014 as a "new patient"  to reestablish care here. Her last office visit here prior to that was 12/2010.  She said that since she was coming here she has gone to the [facility]. Said that they had been prescribing her 2 blood pressure medications. Said that she had not been diagnosed with any other new medical problems since her visit here.  At that visit 06/11/2014 we did complete physical exam. Did full panel a screening lab work. Updated preventative care.  Labs revealed hyperlipidemia with LDL 169. Patient then started simvastatin 40 mg. Had repeat lipid panel 07/24/14 which showed LDL 106 and LFTs normal. She continues on simvastatin 40 mg. She is having no myalgias and no other adverse effects.  Labs also revealed A1c 6.7. Planned that I would review carbohydrate handout with her follow-up visit----has been given and reviewed in detail at visit 12/10/2014. Pt seems very interested/motivated in making changes.  Labs also revealed vitamin D deficiency at 29. Recommended she start 2000 units daily. At follow-up office visit 12/10/2014 she states that yes she is taking 2000 units daily of vitamin D.  She continues to do her exercise. She exercises 4 days a week. Goes to a  "seniors exercise class"--- says that this is mostly stretching type exercises. Says that she does go to the facility early and walks the track for 15 minutes prior to the class. No lower extremity claudication symptoms with this and no angina symptoms with this exertion.  OV 02/24/2015: She says that she came in for visit today because her pharmacy kept telling her that they could not refill medicines and that she had to have office visit  to get refills. However when our staff reviewed her chart we did not see that we had gotten any refill request and that she just had office visit here 12/10/14 and it hasn't even been a full 3 months since last visit. Nonetheless patient presents here for follow-up visit in refills on medicines. I reviewed that at her last visit I gave and reviewed the carbohydrate diet handout. Today she says that she "has cut back on whole lot". Asked her if there were any specific foods that she relies she was consuming too much of that she has really made a big change in.  She says that she really likes bread and has really had to cut that back and then as well and potatoes and rice too". I'll ever I did review her weight--- at the visit 12/10/14 weight was 203. Today 02/24/15 ---202. Would've expected more weight loss than this if she had made significant diet changes and really hadn't eaten that much of these foods prior to the diet changes She has no complaints or concerns today.   12/16/2015: She is still following her low carbohydrate diet. Continues decreased intake of bread potatoes and rice as well as other carbs. She continues to do her same exercise. Goes to "Seniors Exercise Class" 4 days a week. She has glaucoma so she goes to the eye doctor on a routine basis. She states that her last visit there she did inform them of her diabetes and they said they saw no diabetic changes. Today also discussed with her  diabetic foot care and she is aware and educated of this. She is taking blood pressure medications as directed. No lightheadedness or other adverse effects. Taking simvastatin as directed. No myalgias or other adverse effects. She is still taking the vitamin D 2000 units daily. Today's visit she does state that she has been having acid reflux. Says that she notices it mostly at night when she lies down. Says even when she stops eating 3 hours prior to going to bed and only takes her medicine with a  small sip of water prior to going to bed she still feels this reflux at that time. Sometimes during the day. Using Tums and Zantac recently. No other complaints or concerns today.  AT OV 12/16/2015:  Added Lisinopril 10mg  QD (ACE Inh) Discontinued HCTZ 12.5 mg. Decreased Norvasc from 10 mg to 5 mg.----- to avoid developing hypotension----she needed ACE inhibitor for renal protection. Also added aspirin 81 mg daily. Also added omeprazole 20 mg daily for GERD. Planned follow-up office visit 2 weeks to recheck blood pressure and BMET after medication changes.  12/30/2015: She did make all the medication changes as listed above.  Says that she has had no adverse effects and has been feeling just fine. Absolutely no lower extremity edema.  She is taking the aspirin daily.  She is taking the omeprazole. She says that that has made her feel so much better and she is so appreciative for that medication.  Did go get the Zostavax as well. No complaints or concerns today.   Review of Systems: Consitutional: No fever, chills, fatigue, night sweats, lymphadenopathy. No significant/unexplained weight changes. Eyes: No visual changes, eye redness, or discharge. ENT/Mouth: No ear pain, sore throat, nasal drainage, or sinus pain. Cardiovascular: No chest pressure,heaviness, tightness or squeezing, even with exertion. No increased shortness of breath or dyspnea on exertion.No palpitations, edema, orthopnea, PND. Respiratory: No cough, hemoptysis, SOB, or wheezing. Gastrointestinal: No anorexia, dysphagia, reflux, pain, nausea, vomiting, hematemesis, diarrhea, constipation, BRBPR, or melena. Breast: No mass, nodules, bulging, or retraction. No skin changes or inflammation. No nipple discharge. No lymphadenopathy. Genitourinary: No dysuria, hematuria, incontinence, vaginal discharge, pruritis, burning, abnormal bleeding, or pain. Musculoskeletal: No decreased ROM, No joint pain or swelling. No significant pain  in neck, back, or extremities. Skin: No rash, pruritis, or concerning lesions. Neurological: No headache, dizziness, syncope, seizures, tremors, memory loss, coordination problems, or paresthesias. Psychological: No anxiety, depression, hallucinations, SI/HI. Endocrine: No polydipsia, polyphagia, polyuria, or known diabetes.No increased fatigue. No palpitations/rapid heart rate. No significant/unexplained weight change. All other systems were reviewed and are otherwise negative.  Past Medical History:  Diagnosis Date  . Glaucoma   . Hyperlipidemia   . Hypertension      Past Surgical History:  Procedure Laterality Date  . BREAST LUMPECTOMY WITH RADIOACTIVE SEED LOCALIZATION Right 11/04/2015   Procedure: BREAST LUMPECTOMY WITH RADIOACTIVE SEED LOCALIZATION;  Surgeon: Autumn Messing III, MD;  Location: [facility];  Service: General;  Laterality: Right;  . LIPOMA EXCISION      Home Meds:  Outpatient Medications Prior to Visit  Medication Sig Dispense Refill  . amLODipine (NORVASC) 5 MG tablet Take 1 tablet (5 mg total) by mouth daily. 90 tablet 3  . aspirin EC 81 MG tablet Take 1 tablet (81 mg total) by mouth daily. 30 tablet 11  . Cholecalciferol (VITAMIN D) 2000 UNITS tablet Take 2,000 Units by mouth daily.    Marland Kitchen lisinopril (PRINIVIL,ZESTRIL) 10 MG tablet Take 1 tablet (10 mg total) by mouth  daily. 30 tablet 0  . Multiple Vitamin (MULTIVITAMIN) tablet Take 1 tablet by mouth daily.    Marland Kitchen omeprazole (PRILOSEC) 20 MG capsule Take 1 capsule (20 mg total) by mouth daily. 30 capsule 3  . simvastatin (ZOCOR) 40 MG tablet Take 1 tablet (40 mg total) by mouth at bedtime. 90 tablet 0   No facility-administered medications prior to visit.     Allergies:  Allergies  Allergen Reactions  . Latex   . Lipitor [Atorvastatin] Other (See Comments)    Made mouth very dry and lips discolored    Social History   Social History  . Marital status: Married    Spouse name: N/A  . Number of  children: N/A  . Years of education: N/A   Occupational History  . Not on file.   Social History Main Topics  . Smoking status: Former Smoker    Quit date: 05/01/1990  . Smokeless tobacco: Never Used  . Alcohol use No  . Drug use: No  . Sexual activity: Not Currently   Other Topics Concern  . Not on file   Social History Narrative   Entered 06/2014:    Married x 48 years.   2 Children--2 sons   She exercises 4 days a week. Goes to a  "seniors exercise class"--- says that this is mostly stretching type exercises.  Says that she does go to the facility early and walks the track for 15 minutes prior to the class. No lower extremity claudication symptoms with this and no angina symptoms with this exertion.  Family History  Problem Relation Age of Onset  . Diabetes Sister   . Diabetes Brother   . Diabetes Sister   . Diabetes Sister   . Diabetes Sister   . Diabetes Sister     Physical Exam: Blood pressure 124/72, pulse 72, temperature 97.6 F (36.4 C), temperature source Oral, weight 209 lb (94.8 kg), SpO2 98 %., There is no height or weight on file to calculate BMI. General: Well developed, well nourished,AA Female. Appears in no acute distress. Neck: Supple. Trachea midline. No thyromegaly. Full ROM. No lymphadenopathy.No Carotid Bruits. Lungs: Clear to auscultation bilaterally without wheezes, rales, or rhonchi. Breathing is of normal effort and unlabored. Cardiovascular: RRR with S1 S2. No murmurs, rubs, or gallops.  Bilaterally, she has Trace-1+ DP pulses and no palpable PT pulses.  Abdomen: Soft, non-tender, non-distended with normoactive bowel sounds. No hepatosplenomegaly or masses. No rebound/guarding. No CVA tenderness. No hernias.  Musculoskeletal: Full range of motion and 5/5 strength throughout.  Extremities without  edema. Skin: Warm and moist without erythema, ecchymosis, wounds, or rash. Diabetic Foot Exam: She does have a callus on the plantar surface of her  right foot at the "ball of her foot " Remainder of inspection is normal. Sensation intact. Dorsalis pedis and posterior tibial pulses 2+ throughout. Neuro: A+Ox3. CN II-XII grossly intact. Moves all extremities spontaneously. Full sensation throughout. Normal gait.  Psych:  Responds to questions appropriately with a normal affect.   Assessment/Plan:  70 y.o. y/o female here for  . Hyperglycemia When she was seen here as a patient in the past she had hyperglycemia and newly diagnosed diabetes.  When she came back here as to reestablish care with [place] 06/11/14 A1c was 6.7. At office visit 12/10/14 carbohydrate handout was reviewed with her in detail. In addition to the carbohydrates that she needs to limit, also discussed foods that she can eat. Repeat A1c 12/10/2014.  02/24/15---I reviewed that at her  last visit I gave and reviewed the carbohydrate diet handout. Today she says that she "has cut back on whole lot". Asked her if there were any specific foods that she relies she was consuming too much of that she has really made a big change in.  She says that she really likes bread and has really had to cut that back and then as well and potatoes and rice too". I'll ever I did review her weight--- at the visit 12/10/14 weight was 203. Today 02/24/15 ---202. Would've expected more weight loss than this if she had made significant diet changes and really hadn't eaten that much of these foods prior to the diet changes   Diabetes mellitus without complication When she was seen here as a patient in the past she had hyperglycemia and newly does most diabetes.  When she came back here as to reestablish care with [place] 06/11/14 A1c was 6.7. At office visit 12/10/14 carbohydrate handout was reviewed with her in detail. In addition to the carbohydrates that she needs to limit, also discussed foods that she can eat. Repeat A1c 12/10/2014.  02/24/15---I reviewed that at her last visit I gave and reviewed the  carbohydrate diet handout. Today she says that she "has cut back on whole lot". Asked her if there were any specific foods that she realizes she was consuming too much of that she has really made a big change in.  She says that she really likes bread and has really had to cut that back and then as well and potatoes and rice too". I'll ever I did review her weight--- at the visit 12/10/14 weight was 203. Today 02/24/15 ---202. Would've expected more weight loss than this if she had made significant diet changes and really hadn't eaten that much of these foods prior to the diet changes   - Hemoglobin A1c  She is on Statin. She is now on aspirin 81 mg daily. She is now on ACE inhibitor lisinopril 10 mg daily.  Microalbumin checked at visit 12/16/15---normal  At visit 12/16/15 discussed the fact that she already has routine eye exams as she has glaucoma. Says her eye doctor is aware that she has diabetes and they told her she had no signs of diabetic retinopathy.  At visit 12/16/15 discussed proper foot care. She is educated about this and aware to avoid any type of wound to the foot.   2. Hyperlipidemia When she was seen here as a patient in the past she had hyperlipidemia.  When she was seen here 06/11/2014 she was not on any medication for this.  06/11/2014 lipid panel showed LDL 169. She then started simvastatin 40 mg. Follow-up lipid panel 07/24/14 showed LDL 106. Continued simvastatin 40 mg. Rechecked  FLP and LFT  12/10/14.----- labs were stable. She was to continue current medication and follow low carbohydrate handout 11/2015---FLP at goal   3. HYPERTENSION, BENIGN SYSTEMIC She is on blood pressure medications at present. Blood pressure is at goal and control today. BMET normal/stable at last check    4. Vitamin D deficiency - Vit D  25 hydroxy (rtn osteoporosis monitoring) 06/11/14 labs vitamin D was 22. At that time she did start vitamin D 2000 IUs daily. Verified that she is taking  this at her visit 12/10/14. Recheck vitamin D level at that visit. 12/16/15 vitamin D level ---43. Continue current dose.  5. Glaucoma At OV 06/11/2014:She reports that she has seen an eye doctor and has been told she has glaucoma. At  OV 12/10/2014--reviewed that her last eye exam was July 2015. And I discussed that she needs to schedule annual follow-up eye exam she says that she usually goes every 2 years. Then asked about the glaucoma and she says that she had glaucoma about 10 years ago but she had treatment with laser surgery to correct that. At [place] 12/16/15 she reports that her eye doctor is aware of her diabetes and that at her last eye exam a told her no signs of diabetic change. She does continue to follow up with her eye doctor routinely.  6. GERD At office visit 12/16/2015 added omeprazole 20 mg daily. At that visit I also discussed stopping eating and drinking couple hours prior to going to sleep. He states that she already had quit doing all eating and drinking other than taking her nighttime pill with one sip of water and still has reflux when she lies down despite these changes. Also has some reflux symptoms during the day.  THE FOLLOWING IS COPIED FROM HER CPE NOTE 06/11/2014:  Visit for preventive health examination  A. Screening Labs: - CBC with Differential/Platelet - COMPLETE METABOLIC PANEL WITH GFR - Lipid panel - Hemoglobin A1c - TSH - Vit D  25 hydroxy (rtn osteoporosis monitoring) - MM Digital Screening; Future  B. Pap: She reports that she did see gynecology in the past but that she probably last saw them in the 1990s. Is that back then her Pap smears were all normal. She defers further pelvic exams or Pap smears.  C. Screening Mammogram: 06/11/2014: "She reports that she has had no mammogram since the 1990s. She is agreeable for me to schedule follow-up mammogram." She had mammogram 07/08/14 and further evaluation was needed so they did that 07/14/2014. Negative.  D.  DEXA/BMD:  06/11/2014: She reports that she has not had a bone density test recently and she is agreeable for me to schedule follow-up bone density test. She had bone density scan 07/08/14. We have already called her regarding those results and gave her instructions. This showed osteopenia. We recommended her continue her weightbearing exercise and also take calcium and vitamin D. Today 12/10/14 she states that she is taking 1500 mg of calcium a day and 2000 units vitamin D daily.  E. Colorectal Cancer Screening: She reports that she had a colonoscopy around 2007 that was normal and was performed by Dr. Collene Mares ---------------------AT 02/24/15 OV----I HAD HER SIGN RELEASE TO GET RECORD FROM DR Collene Mares-----------------------  F. Immunizations:  Influenza: ----Influenza vaccine given here 06/11/14 Tetanus: ----T dap given here 06/11/14 Pneumococcal:---- Prevnar 13 given here 06/11/14   ------Pneumovax 23--Given here 12/10/2014 Zostavax:  AT OV 12/10/2014---I WROTE THIS ON HER AVS TO REMIND HER---TO CHECK COST WITH INSURANCE THEN CALL us ----------------AT OV 02/24/15-----he says that she did check on this and it was going to be a $47 co-pay and she is agreeable to pay this price. Today I had chem go ahead and send order to the pharmacy and I told patient to go to the pharmacy and get this. ------------- at [place] 12/16/15 she says that she forgot to ever go get this vaccine. I wrote on her AVS today and reminded her to do so -------------At [place] 12/30/2015---she reports that she did go get the Zostavax  She is to schedule follow-up office visit 3 months Follow-up sooner if needed.  Signed, [address] [place], [place], [facility] 12/30/2015 8:10 AM

## 2015-12-31 LAB — BASIC METABOLIC PANEL WITH GFR
BUN: 14 mg/dL (ref 7–25)
CHLORIDE: 107 mmol/L (ref 98–110)
CO2: 22 mmol/L (ref 20–31)
CREATININE: 1.12 mg/dL — AB (ref 0.50–0.99)
Calcium: 9.3 mg/dL (ref 8.6–10.4)
GFR, EST NON AFRICAN AMERICAN: 50 mL/min — AB (ref >=60)
GFR, Est African American: 58 mL/min — ABNORMAL LOW (ref >=60)
Glucose, Bld: 112 mg/dL — ABNORMAL HIGH (ref 70–99)
Potassium: 4.4 mmol/L (ref 3.5–5.3)
SODIUM: 141 mmol/L (ref 135–146)

## 2016-01-13 ENCOUNTER — Other Ambulatory Visit: Payer: Self-pay | Admitting: Physician Assistant

## 2016-01-13 DIAGNOSIS — I10 Essential (primary) hypertension: Secondary | ICD-10-CM

## 2016-02-11 ENCOUNTER — Other Ambulatory Visit: Payer: Self-pay | Admitting: Physician Assistant

## 2016-02-11 DIAGNOSIS — I10 Essential (primary) hypertension: Secondary | ICD-10-CM

## 2016-02-11 NOTE — Telephone Encounter (Signed)
RX refilled per protocol 

## 2016-02-12 ENCOUNTER — Other Ambulatory Visit: Payer: Self-pay | Admitting: Physician Assistant

## 2016-02-12 DIAGNOSIS — I10 Essential (primary) hypertension: Secondary | ICD-10-CM

## 2016-02-15 MED ORDER — AMLODIPINE BESYLATE 5 MG PO TABS: 5.0000 mg | ORAL_TABLET | Freq: Every day | ORAL | 0 refills | Status: DC

## 2016-02-15 NOTE — Telephone Encounter (Signed)
Rx refilled per protocol 

## 2016-03-24 ENCOUNTER — Other Ambulatory Visit: Payer: Self-pay | Admitting: Physician Assistant

## 2016-03-27 NOTE — Telephone Encounter (Signed)
Rx filled per protocol  

## 2016-04-06 ENCOUNTER — Ambulatory Visit (INDEPENDENT_AMBULATORY_CARE_PROVIDER_SITE_OTHER): Payer: Commercial Managed Care - HMO | Admitting: Physician Assistant

## 2016-04-06 ENCOUNTER — Encounter: Payer: Self-pay | Admitting: Physician Assistant

## 2016-04-06 VITALS — BP 124/70 | HR 72 | Temp 97.7°F | Resp 18 | Wt 208.0 lb

## 2016-04-06 DIAGNOSIS — R739 Hyperglycemia, unspecified: Secondary | ICD-10-CM | POA: Diagnosis not present

## 2016-04-06 DIAGNOSIS — K219 Gastro-esophageal reflux disease without esophagitis: Secondary | ICD-10-CM

## 2016-04-06 DIAGNOSIS — E785 Hyperlipidemia, unspecified: Secondary | ICD-10-CM | POA: Diagnosis not present

## 2016-04-06 DIAGNOSIS — E119 Type 2 diabetes mellitus without complications: Secondary | ICD-10-CM

## 2016-04-06 DIAGNOSIS — E559 Vitamin D deficiency, unspecified: Secondary | ICD-10-CM

## 2016-04-06 DIAGNOSIS — Z23 Encounter for immunization: Secondary | ICD-10-CM | POA: Diagnosis not present

## 2016-04-06 DIAGNOSIS — I10 Essential (primary) hypertension: Secondary | ICD-10-CM | POA: Diagnosis not present

## 2016-04-06 LAB — HEMOGLOBIN A1C, FINGERSTICK: Hgb A1C (fingerstick): 6.5 % — ABNORMAL HIGH (ref <5.7)

## 2016-04-06 MED ORDER — LISINOPRIL 10 MG PO TABS: 10.0000 mg | ORAL_TABLET | Freq: Every day | ORAL | 1 refills | Status: DC

## 2016-04-06 MED ORDER — OMEPRAZOLE 20 MG PO CPDR: 20.0000 mg | DELAYED_RELEASE_CAPSULE | Freq: Every day | ORAL | 2 refills | Status: DC

## 2016-04-06 MED ORDER — SIMVASTATIN 40 MG PO TABS: 40.0000 mg | ORAL_TABLET | Freq: Every day | ORAL | 1 refills | Status: DC

## 2016-04-06 NOTE — Addendum Note (Signed)
Addended by: Vonna Kotyk A on: 04/06/2016 11:18 AM   Modules accepted: Orders

## 2016-04-06 NOTE — Progress Notes (Signed)
Patient ID: Heather Gross MRN: [id number], DOB: 1945/08/18, 70 y.o. Date of Encounter: 04/06/2016,   Chief Complaint: Routine follow-up office visit and lab work.  HPI: 70 y.o. y/o female  here for routine follow-up office visit and lab work.  She was seen on 06/11/2014 as a "new patient"  to reestablish care here. Her last office visit here prior to that was 12/2010.  She said that since she was coming here she has gone to the [facility]. Said that they had been prescribing her 2 blood pressure medications. Said that she had not been diagnosed with any other new medical problems since her visit here.  At that visit 06/11/2014 we did complete physical exam. Did full panel a screening lab work. Updated preventative care.  Labs revealed hyperlipidemia with LDL 169. Patient then started simvastatin 40 mg. Had repeat lipid panel 07/24/14 which showed LDL 106 and LFTs normal.  Labs also revealed A1c 6.7. Planned that I would review carbohydrate handout with her follow-up visit----has been given and reviewed in detail at visit 12/10/2014. Pt seems very interested/motivated in making changes.  Labs also revealed vitamin D deficiency at 38. Recommended she start 2000 units daily.   She continues to do her exercise. She exercises 4 days a week. Goes to a  "seniors exercise class"--- says that this is mostly stretching type exercises. Says that she does go to the facility early and walks the track for 15 minutes prior to the class. No lower extremity claudication symptoms with this and no angina symptoms with this exertion.  At [place] 02/24/2015 she said that she "has cut back on whole lot".  Said that she really likes bread and has really had to cut that back and then as well and potatoes and rice too". However at that visit her weight was only down 1 pound.  12/16/2015: She is still following her low carbohydrate diet. Continues decreased intake of bread potatoes and rice as well  as other carbs. She continues to do her same exercise. Goes to "Seniors Exercise Class" 4 days a week. She has glaucoma so she goes to the eye doctor on a routine basis. She states that her last visit there she did inform them of her diabetes and they said they saw no diabetic changes. Today also discussed with her diabetic foot care and she is aware and educated of this. Today's visit she does state that she has been having acid reflux. Says that she notices it mostly at night when she lies down. Says even when she stops eating 3 hours prior to going to bed and only takes her medicine with a small sip of water prior to going to bed she still feels this reflux at that time. Sometimes during the day. Using Tums and Zantac recently.  AT [facility] 12/16/2015:  Added Lisinopril 10mg  QD (ACE Inh) Discontinued HCTZ 12.5 mg. Decreased Norvasc from 10 mg to 5 mg.----- to avoid developing hypotension----she needed ACE inhibitor for renal protection. Also added aspirin 81 mg daily. Also added omeprazole 20 mg daily for GERD. Planned follow-up office visit 2 weeks to recheck blood pressure and BMET after medication changes.  12/30/2015: She did make all the medication changes as listed above.  Says that she has had no adverse effects and has been feeling just fine. Absolutely no lower extremity edema.  She is taking the aspirin daily.  She is taking the omeprazole. She says that that has made her feel so much better  and she is so appreciative for that medication.  Did go get the Zostavax as well.  04/06/2016: She is taking blood pressure medications as directed. No lightheadedness or other adverse effects. She is taking simvastatin as directed. No myalgias or other adverse effects. She is taking vitamin D supplement.--2,000 units daily. Taking Calcium supplement also. She states that she is taking the omeprazole almost every day. She tries to skip it and not have to use it all the time but usually has heartburn  symptoms that she does not send a taking it most every day. She says that she is trying to be careful with her diet and her bread and carbohydrates. However noted that weight today is up to 208 pounds. Told her to be careful especially now with the holidays. She is still going to her seniors exercise class 4 days a week.   Review of Systems: Consitutional: No fever, chills, fatigue, night sweats, lymphadenopathy. No significant/unexplained weight changes. Eyes: No visual changes, eye redness, or discharge. ENT/Mouth: No ear pain, sore throat, nasal drainage, or sinus pain. Cardiovascular: No chest pressure,heaviness, tightness or squeezing, even with exertion. No increased shortness of breath or dyspnea on exertion.No palpitations, edema, orthopnea, PND. Respiratory: No cough, hemoptysis, SOB, or wheezing. Gastrointestinal: No anorexia, dysphagia, reflux, pain, nausea, vomiting, hematemesis, diarrhea, constipation, BRBPR, or melena. Breast: No mass, nodules, bulging, or retraction. No skin changes or inflammation. No nipple discharge. No lymphadenopathy. Genitourinary: No dysuria, hematuria, incontinence, vaginal discharge, pruritis, burning, abnormal bleeding, or pain. Musculoskeletal: No decreased ROM, No joint pain or swelling. No significant pain in neck, back, or extremities. Skin: No rash, pruritis, or concerning lesions. Neurological: No headache, dizziness, syncope, seizures, tremors, memory loss, coordination problems, or paresthesias. Psychological: No anxiety, depression, hallucinations, SI/HI. Endocrine: No polydipsia, polyphagia, polyuria, or known diabetes.No increased fatigue. No palpitations/rapid heart rate. No significant/unexplained weight change. All other systems were reviewed and are otherwise negative.  Past Medical History:  Diagnosis Date  . Glaucoma   . Hyperlipidemia   . Hypertension      Past Surgical History:  Procedure Laterality Date  . BREAST LUMPECTOMY  WITH RADIOACTIVE SEED LOCALIZATION Right 11/04/2015   Procedure: BREAST LUMPECTOMY WITH RADIOACTIVE SEED LOCALIZATION;  Surgeon: Autumn Messing III, MD;  Location: [place];  Service: General;  Laterality: Right;  . LIPOMA EXCISION      Home Meds:  Outpatient Medications Prior to Visit  Medication Sig Dispense Refill  . amLODipine (NORVASC) 5 MG tablet Take 1 tablet (5 mg total) by mouth daily. 60 tablet 0  . aspirin EC 81 MG tablet Take 1 tablet (81 mg total) by mouth daily. 30 tablet 11  . Cholecalciferol (VITAMIN D) 2000 UNITS tablet Take 2,000 Units by mouth daily.    Marland Kitchen lisinopril (PRINIVIL,ZESTRIL) 10 MG tablet TAKE 1 TABLET BY MOUTH ONCE DAILY 60 tablet 0  . Multiple Vitamin (MULTIVITAMIN) tablet Take 1 tablet by mouth daily.    Marland Kitchen omeprazole (PRILOSEC) 20 MG capsule Take 1 capsule (20 mg total) by mouth daily. 30 capsule 3  . simvastatin (ZOCOR) 40 MG tablet TAKE 1 TABLET BY MOUTH AT BEDTIME 30 tablet 0   No facility-administered medications prior to visit.     Allergies:  Allergies  Allergen Reactions  . Latex   . Lipitor [Atorvastatin] Other (See Comments)    Made mouth very dry and lips discolored    Social History   Social History  . Marital status: Married    Spouse name: N/A  .  Number of children: N/A  . Years of education: N/A   Occupational History  . Not on file.   Social History Main Topics  . Smoking status: Former Smoker    Quit date: 05/01/1990  . Smokeless tobacco: Never Used  . Alcohol use No  . Drug use: No  . Sexual activity: Not Currently   Other Topics Concern  . Not on file   Social History Narrative   Entered 06/2014:    Married x 48 years.   2 Children--2 sons   She exercises 4 days a week. Goes to a  "seniors exercise class"--- says that this is mostly stretching type exercises.  Says that she does go to the facility early and walks the track for 15 minutes prior to the class. No lower extremity claudication symptoms with this  and no angina symptoms with this exertion.  Family History  Problem Relation Age of Onset  . Diabetes Sister   . Diabetes Brother   . Diabetes Sister   . Diabetes Sister   . Diabetes Sister   . Diabetes Sister     Physical Exam: Blood pressure 124/70, pulse 72, temperature 97.7 F (36.5 C), temperature source Oral, resp. rate 18, weight 208 lb (94.3 kg)., There is no height or weight on file to calculate BMI. General: Well developed, well nourished,AA Female. Appears in no acute distress. Neck: Supple. Trachea midline. No thyromegaly. Full ROM. No lymphadenopathy.No Carotid Bruits. Lungs: Clear to auscultation bilaterally without wheezes, rales, or rhonchi. Breathing is of normal effort and unlabored. Cardiovascular: RRR with S1 S2. No murmurs, rubs, or gallops.  Bilaterally, she has Trace-1+ DP pulses and no palpable PT pulses.  Abdomen: Soft, non-tender, non-distended with normoactive bowel sounds. No hepatosplenomegaly or masses. No rebound/guarding. No CVA tenderness. No hernias.  Musculoskeletal: Full range of motion and 5/5 strength throughout.  Extremities without  edema. Skin: Warm and moist without erythema, ecchymosis, wounds, or rash. Diabetic Foot Exam: She does have a callus on the plantar surface of her right foot at the "ball of her foot " Remainder of inspection is normal. Sensation intact. Dorsalis pedis and posterior tibial pulses 2+ throughout. Neuro: A+Ox3. CN II-XII grossly intact. Moves all extremities spontaneously. Full sensation throughout. Normal gait.  Psych:  Responds to questions appropriately with a normal affect.   Assessment/Plan:  70 y.o. y/o female here for    Diabetes mellitus without complication When she was seen here as a patient in the past she had hyperglycemia and newly does most diabetes.  When she came back here as to reestablish care with [place] 06/11/14 A1c was 6.7. At office visit 12/10/14 carbohydrate handout was reviewed with her in detail.  In addition to the carbohydrates that she needs to limit, also discussed foods that she can eat. Repeat A1c 12/10/2014.  02/24/15---I reviewed that at her last visit I gave and reviewed the carbohydrate diet handout. Today she says that she "has cut back on whole lot". Asked her if there were any specific foods that she realizes she was consuming too much of that she has really made a big change in.  She says that she really likes bread and has really had to cut that back and then as well and potatoes and rice too". However I did review her weight--- at the visit 12/10/14 weight was 203.  02/24/15 ---202. Would've expected more weight loss than this if she had made significant diet changes and really hadn't eaten that much of these foods prior to  the diet changes 04/06/2016--weight 208 lb  04/06/2016--Check- Hemoglobin A1c  She is on Statin. She is now on aspirin 81 mg daily. She is now on ACE inhibitor lisinopril 10 mg daily.  Microalbumin checked at visit 12/16/15---normal  At visit 12/16/15 discussed the fact that she already has routine eye exams as she has glaucoma. Says her eye doctor is aware that she has diabetes and they told her she had no signs of diabetic retinopathy.  At visit 12/16/15 discussed proper foot care. She is educated about this and aware to avoid any type of wound to the foot.   2. Hyperlipidemia When she was seen here as a patient in the past she had hyperlipidemia.  When she was seen here 06/11/2014 she was not on any medication for this.  06/11/2014 lipid panel showed LDL 169. She then started simvastatin 40 mg. Follow-up lipid panel 07/24/14 showed LDL 106. Continued simvastatin 40 mg. Rechecked  FLP and LFT  12/10/14.----- labs were stable. She was to continue current medication and follow low carbohydrate handout 11/2015---FLP at goal 04/06/2016--- reviewed that lipid panel has been at goal. LDL was 72 and then at 96 at recheck. LFTis normal. Continue current dose of  statin.  3. HYPERTENSION, BENIGN SYSTEMIC 04/06/2016: . Blood pressure is at goal and controloled today. She is on ACE Inh for renal protection, given her diabetes. BMET normal/stable at last check    4. Vitamin D deficiency - Vit D  25 hydroxy (rtn osteoporosis monitoring) 06/11/14 labs vitamin D was 22. At that time she did start vitamin D 2000 IUs daily. Verified that she is taking this at her visit 12/10/14. Recheck vitamin D level at that visit. 12/16/15 vitamin D level ---43. Continue current dose. 04/06/16--- she does continue taking vitamin D 2000 units daily.  5. Glaucoma At [facility] 06/11/2014:She reports that she has seen an eye doctor and has been told she has glaucoma. At [place] 12/10/2014--reviewed that her last eye exam was July 2015. And I discussed that she needs to schedule annual follow-up eye exam she says that she usually goes every 2 years. Then asked about the glaucoma and she says that she had glaucoma about 10 years ago but she had treatment with laser surgery to correct that. At [place] 12/16/15 she reports that her eye doctor is aware of her diabetes and that at her last eye exam a told her no signs of diabetic change. She does continue to follow up with her eye doctor routinely.  6. GERD At office visit 12/16/2015 added omeprazole 20 mg daily. At that visit I also discussed stopping eating and drinking couple hours prior to going to sleep. He states that she already had quit doing all eating and drinking other than taking her nighttime pill with one sip of water and still has reflux when she lies down despite these changes. Also has some reflux symptoms during the day. 04/06/2016--- she states that she tries to go without the omeprazole some but ends up needing it most every day.  THE FOLLOWING IS COPIED FROM HER CPE NOTE 06/11/2014:  Visit for preventive health examination  A. Screening Labs: - CBC with Differential/Platelet - COMPLETE METABOLIC PANEL WITH GFR - Lipid panel -  Hemoglobin A1c - TSH - Vit D  25 hydroxy (rtn osteoporosis monitoring) - MM Digital Screening; Future  B. Pap: She reports that she did see gynecology in the past but that she probably last saw them in the 1990s. Is that back then her Pap  smears were all normal. She defers further pelvic exams or Pap smears.  C. Screening Mammogram: 06/11/2014: "She reports that she has had no mammogram since the 1990s. She is agreeable for me to schedule follow-up mammogram." She had mammogram 07/08/14 and further evaluation was needed so they did that 07/14/2014. Negative.  D. DEXA/BMD:  06/11/2014: She reports that she has not had a bone density test recently and she is agreeable for me to schedule follow-up bone density test. She had bone density scan 07/08/14. We have already called her regarding those results and gave her instructions. This showed osteopenia. We recommended her continue her weightbearing exercise and also take calcium and vitamin D. Today 12/10/14 she states that she is taking 1500 mg of calcium a day and 2000 units vitamin D daily.  E. Colorectal Cancer Screening: She reports that she had a colonoscopy around 2007 that was normal and was performed by Dr. Collene Mares ---------------------AT 02/24/15 [facility]----I HAD HER SIGN RELEASE TO GET RECORD FROM DR Collene Mares-----------------------  F. Immunizations:  Influenza: ----Influenza vaccine given here 06/11/14 Tetanus: ----T dap given here 06/11/14 Pneumococcal:---- Prevnar 13 given here 06/11/14   ------Pneumovax 23--Given here 12/10/2014 Zostavax:  She did go get the Zostavax at the pharmacy. Sometime between the 2016 and 2017 at [place].  She is to schedule follow-up office visit 3 months Follow-up sooner if needed.  Signed, [address] [place], [place], [facility] 04/06/2016 7:55 AM

## 2016-04-11 ENCOUNTER — Other Ambulatory Visit: Payer: Self-pay | Admitting: Physician Assistant

## 2016-04-11 DIAGNOSIS — K219 Gastro-esophageal reflux disease without esophagitis: Secondary | ICD-10-CM

## 2016-04-25 ENCOUNTER — Other Ambulatory Visit: Payer: Self-pay | Admitting: Physician Assistant

## 2016-04-27 NOTE — Telephone Encounter (Signed)
rx filled per protocol  

## 2016-05-14 ENCOUNTER — Other Ambulatory Visit: Payer: Self-pay | Admitting: Physician Assistant

## 2016-07-20 ENCOUNTER — Encounter: Payer: Self-pay | Admitting: Physician Assistant

## 2016-07-20 ENCOUNTER — Ambulatory Visit (INDEPENDENT_AMBULATORY_CARE_PROVIDER_SITE_OTHER): Payer: Medicare HMO | Admitting: Physician Assistant

## 2016-07-20 VITALS — BP 132/80 | HR 78 | Temp 97.9°F | Resp 16 | Wt 208.6 lb

## 2016-07-20 DIAGNOSIS — E559 Vitamin D deficiency, unspecified: Secondary | ICD-10-CM

## 2016-07-20 DIAGNOSIS — N3281 Overactive bladder: Secondary | ICD-10-CM | POA: Diagnosis not present

## 2016-07-20 DIAGNOSIS — K219 Gastro-esophageal reflux disease without esophagitis: Secondary | ICD-10-CM

## 2016-07-20 DIAGNOSIS — E119 Type 2 diabetes mellitus without complications: Secondary | ICD-10-CM | POA: Diagnosis not present

## 2016-07-20 DIAGNOSIS — E785 Hyperlipidemia, unspecified: Secondary | ICD-10-CM

## 2016-07-20 DIAGNOSIS — I10 Essential (primary) hypertension: Secondary | ICD-10-CM

## 2016-07-20 MED ORDER — MIRABEGRON ER 50 MG PO TB24: 50.0000 mg | ORAL_TABLET | Freq: Every day | ORAL | 11 refills | Status: DC

## 2016-07-20 NOTE — Progress Notes (Signed)
Patient ID: Heather Gross MRN: [id number], DOB: 1946-04-11, 71 y.o. Date of Encounter: 07/20/2016,   Chief Complaint: Routine follow-up office visit and lab work.  HPI: 71 y.o. y/o female  here for routine follow-up office visit and lab work.  She was seen on 06/11/2014 as a "new patient"  to reestablish care here. Her last office visit here prior to that was 12/2010.  She said that since she was coming here she has gone to the [facility]. Said that they had been prescribing her 2 blood pressure medications. Said that she had not been diagnosed with any other new medical problems since her visit here.  At that visit 06/11/2014 we did complete physical exam. Did full panel a screening lab work. Updated preventative care.  Labs revealed hyperlipidemia with LDL 169. Patient then started simvastatin 40 mg. Had repeat lipid panel 07/24/14 which showed LDL 106 and LFTs normal.  Labs also revealed A1c 6.7. Planned that I would review carbohydrate handout with her follow-up visit----has been given and reviewed in detail at visit 12/10/2014. Pt seems very interested/motivated in making changes.  Labs also revealed vitamin D deficiency at 21. Recommended she start 2000 units daily.   She continues to do her exercise. She exercises 4 days a week. Goes to a  "seniors exercise class"--- says that this is mostly stretching type exercises. Says that she does go to the facility early and walks the track for 15 minutes prior to the class. No lower extremity claudication symptoms with this and no angina symptoms with this exertion.  At [place] 02/24/2015 she said that she "has cut back on whole lot".  Said that she really likes bread and has really had to cut that back and then as well and potatoes and rice too". However at that visit her weight was only down 1 pound.  12/16/2015: She is still following her low carbohydrate diet. Continues decreased intake of bread potatoes and rice as well  as other carbs. She continues to do her same exercise. Goes to "Seniors Exercise Class" 4 days a week. She has glaucoma so she goes to the eye doctor on a routine basis. She states that her last visit there she did inform them of her diabetes and they said they saw no diabetic changes. Today also discussed with her diabetic foot care and she is aware and educated of this. Today's visit she does state that she has been having acid reflux. Says that she notices it mostly at night when she lies down. Says even when she stops eating 3 hours prior to going to bed and only takes her medicine with a small sip of water prior to going to bed she still feels this reflux at that time. Sometimes during the day. Using Tums and Zantac recently.  AT [facility] 12/16/2015:  Added Lisinopril 10mg  QD (ACE Inh) Discontinued HCTZ 12.5 mg. Decreased Norvasc from 10 mg to 5 mg.----- to avoid developing hypotension----she needed ACE inhibitor for renal protection. Also added aspirin 81 mg daily. Also added omeprazole 20 mg daily for GERD. Planned follow-up office visit 2 weeks to recheck blood pressure and BMET after medication changes.  12/30/2015: She did make all the medication changes as listed above.  Says that she has had no adverse effects and has been feeling just fine. Absolutely no lower extremity edema.  She is taking the aspirin daily.  She is taking the omeprazole. She says that that has made her feel so much better  and she is so appreciative for that medication.  Did go get the Zostavax as well.  04/06/2016: She is taking blood pressure medications as directed. No lightheadedness or other adverse effects. She is taking simvastatin as directed. No myalgias or other adverse effects. She is taking vitamin D supplement.--2,000 units daily. Taking Calcium supplement also. She states that she is taking the omeprazole almost every day. She tries to skip it and not have to use it all the time but usually has heartburn  symptoms that she does not send a taking it most every day. She says that she is trying to be careful with her diet and her bread and carbohydrates. However noted that weight today is up to 208 pounds. Told her to be careful especially now with the holidays. She is still going to her seniors exercise class 4 days a week.   07/20/2016: Today she reports that she has been having urinary frequency and nocturia and urgency for several months. Says that she is having to go to the bathroom multiple times at night. She occasionally leaks a little bit of urine if she doesn't get to the bathroom fast enough. Urge incontinence. No stress incontinence. Does not report any urine leakage with coughing sneezing and laughing. She is taking blood pressure medications as directed. No lightheadedness or other adverse effects. She is taking simvastatin as directed. No myalgias or other adverse effects. She is taking vitamin D supplement.--2,000 units daily. Taking Calcium supplement also. She states that she is taking the omeprazole almost every day. She tries to skip it and not have to use it all the time but usually has heartburn symptoms that she does not send a taking it most every day. She says that she is trying to be careful with her diet and her bread and carbohydrates.  She is still going to her seniors exercise class 4 days a week. At [place] 03/2016 noted that weight was up to 208 pounds. Today weight stable at 208.   Review of Systems: Consitutional: No fever, chills, fatigue, night sweats, lymphadenopathy. No significant/unexplained weight changes. Eyes: No visual changes, eye redness, or discharge. ENT/Mouth: No ear pain, sore throat, nasal drainage, or sinus pain. Cardiovascular: No chest pressure,heaviness, tightness or squeezing, even with exertion. No increased shortness of breath or dyspnea on exertion.No palpitations, edema, orthopnea, PND. Respiratory: No cough, hemoptysis, SOB, or  wheezing. Gastrointestinal: No anorexia, dysphagia, reflux, pain, nausea, vomiting, hematemesis, diarrhea, constipation, BRBPR, or melena. Breast: No mass, nodules, bulging, or retraction. No skin changes or inflammation. No nipple discharge. No lymphadenopathy. Genitourinary: No dysuria, hematuria, incontinence, vaginal discharge, pruritis, burning, abnormal bleeding, or pain. Musculoskeletal: No decreased ROM, No joint pain or swelling. No significant pain in neck, back, or extremities. Skin: No rash, pruritis, or concerning lesions. Neurological: No headache, dizziness, syncope, seizures, tremors, memory loss, coordination problems, or paresthesias. Psychological: No anxiety, depression, hallucinations, SI/HI. Endocrine: No polydipsia, polyphagia, polyuria, or known diabetes.No increased fatigue. No palpitations/rapid heart rate. No significant/unexplained weight change. All other systems were reviewed and are otherwise negative.  Past Medical History:  Diagnosis Date  . Glaucoma   . Hyperlipidemia   . Hypertension      Past Surgical History:  Procedure Laterality Date  . BREAST LUMPECTOMY WITH RADIOACTIVE SEED LOCALIZATION Right 11/04/2015   Procedure: BREAST LUMPECTOMY WITH RADIOACTIVE SEED LOCALIZATION;  Surgeon: Autumn Messing III, MD;  Location: [facility];  Service: General;  Laterality: Right;  . LIPOMA EXCISION      Home Meds:  Outpatient Medications Prior to Visit  Medication Sig Dispense Refill  . amLODipine (NORVASC) 10 MG tablet TAKE 1 TABLET BY MOUTH EVERY DAY 90 tablet 0  . aspirin EC 81 MG tablet Take 1 tablet (81 mg total) by mouth daily. 30 tablet 11  . Cholecalciferol (VITAMIN D) 2000 UNITS tablet Take 2,000 Units by mouth daily.    Marland Kitchen lisinopril (PRINIVIL,ZESTRIL) 10 MG tablet Take 1 tablet (10 mg total) by mouth daily. 90 tablet 1  . Multiple Vitamin (MULTIVITAMIN) tablet Take 1 tablet by mouth daily.    Marland Kitchen omeprazole (PRILOSEC) 20 MG capsule Take 1  capsule (20 mg total) by mouth daily. 90 capsule 2  . simvastatin (ZOCOR) 40 MG tablet TAKE 1 TABLET BY MOUTH AT BEDTIME 90 tablet 0  . omeprazole (PRILOSEC) 20 MG capsule TAKE ONE CAPSULE BY MOUTH EVERY DAY 30 capsule 3  . simvastatin (ZOCOR) 40 MG tablet Take 1 tablet (40 mg total) by mouth at bedtime. 90 tablet 1   No facility-administered medications prior to visit.     Allergies:  Allergies  Allergen Reactions  . Latex   . Lipitor [Atorvastatin] Other (See Comments)    Made mouth very dry and lips discolored    Social History   Social History  . Marital status: Married    Spouse name: N/A  . Number of children: N/A  . Years of education: N/A   Occupational History  . Not on file.   Social History Main Topics  . Smoking status: Former Smoker    Quit date: 05/01/1990  . Smokeless tobacco: Never Used  . Alcohol use No  . Drug use: No  . Sexual activity: Not Currently   Other Topics Concern  . Not on file   Social History Narrative   Entered 06/2014:    Married x 48 years.   2 Children--2 sons   She exercises 4 days a week. Goes to a  "seniors exercise class"--- says that this is mostly stretching type exercises.  Says that she does go to the facility early and walks the track for 15 minutes prior to the class. No lower extremity claudication symptoms with this and no angina symptoms with this exertion.  Family History  Problem Relation Age of Onset  . Diabetes Sister   . Diabetes Brother   . Diabetes Sister   . Diabetes Sister   . Diabetes Sister   . Diabetes Sister     Physical Exam: There were no vitals taken for this visit., There is no height or weight on file to calculate BMI. General: Well developed, well nourished,AA Female. Appears in no acute distress. Neck: Supple. Trachea midline. No thyromegaly. Full ROM. No lymphadenopathy.No Carotid Bruits. Left Neck---protrusion--soft, mobile---there is scar over this---she had lipoma removed there---says that  she also had lipoma on her back removed and it re-occurred. Lungs: Clear to auscultation bilaterally without wheezes, rales, or rhonchi. Breathing is of normal effort and unlabored. Cardiovascular: RRR with S1 S2. No murmurs, rubs, or gallops.   Abdomen: Soft, non-tender, non-distended with normoactive bowel sounds. No hepatosplenomegaly or masses. No rebound/guarding. No CVA tenderness. No hernias.  Musculoskeletal: Full range of motion and 5/5 strength throughout.  Extremities without  edema. Skin: Warm and moist without erythema, ecchymosis, wounds, or rash. Diabetic Foot Exam: She does have a callus on the plantar surface of her right foot at the "ball of her foot " Remainder of inspection is normal. Sensation intact. Dorsalis pedis and posterior tibial pulses 2+ throughout. Neuro:  A+Ox3. CN II-XII grossly intact. Moves all extremities spontaneously. Full sensation throughout. Normal gait.  Psych:  Responds to questions appropriately with a normal affect.   Assessment/Plan:  71 y.o. y/o female here for    Diabetes mellitus without complication When she was seen here as a patient in the past she had hyperglycemia and newly diagnosed diabetes.  When she came back here as to reestablish care with [place] 06/11/14 A1c was 6.7. At office visit 12/10/14 carbohydrate handout was reviewed with her in detail. In addition to the carbohydrates that she needs to limit, also discussed foods that she can eat. Repeat A1c 12/10/2014.  02/24/15---I reviewed that at her last visit I gave and reviewed the carbohydrate diet handout. Today she says that she "has cut back on whole lot". Asked her if there were any specific foods that she realizes she was consuming too much of that she has really made a big change in.  She says that she really likes bread and has really had to cut that back and then as well and potatoes and rice too". However I did review her weight--- at the visit 12/10/14 weight was 203.  02/24/15  ---202. Would've expected more weight loss than this if she had made significant diet changes and really hadn't eaten that much of these foods prior to the diet changes 04/06/2016--weight 208 lb   She is on Statin. She is now on aspirin 81 mg daily. She is now on ACE inhibitor lisinopril 10 mg daily.  Microalbumin checked at visit 12/16/15---normal  At visit 12/16/15 discussed the fact that she already has routine eye exams as she has glaucoma. Says her eye doctor is aware that she has diabetes and they told her she had no signs of diabetic retinopathy.  At visit 12/16/15 discussed proper foot care. She is educated about this and aware to avoid any type of wound to the foot.  07/20/2016----Check A1C, CMET--Reminded her about proper foot care, cont diet, exercise in addition to meds  Hyperlipidemia When she was seen here as a patient in the past she had hyperlipidemia.  When she was seen here 06/11/2014 she was not on any medication for this.  06/11/2014 lipid panel showed LDL 169. She then started simvastatin 40 mg. Follow-up lipid panel 07/24/14 showed LDL 106. Continued simvastatin 40 mg. Rechecked  FLP and LFT  12/10/14.----- labs were stable. She was to continue current medication and follow low carbohydrate handout 11/2015---FLP at goal 04/06/2016--- reviewed that lipid panel has been at goal. LDL was 72 and then at 96 at recheck. LFTis normal. Continue current dose of statin. 07/20/2016: She is fasting. Recheck FLP/LFT  HYPERTENSION, BENIGN SYSTEMIC 07/20/2016:   Blood pressure is at goal and controlled today. She is on ACE Inh for renal protection, given her diabetes. BMET normal/stable at last check   OAB (overactive bladder) 07/20/2016: At visit today she reports symptoms consistent with overactive bladder and urge incontinence. Will start Myrbetriq. She is to call me if this causes adverse effects or does not control her symptoms. - mirabegron ER (MYRBETRIQ) 50 MG TB24 tablet; Take 1  tablet (50 mg total) by mouth daily.  Dispense: 30 tablet; Refill: 11  Vitamin D deficiency - Vit D  25 hydroxy (rtn osteoporosis monitoring) 06/11/14 labs vitamin D was 22. At that time she did start vitamin D 2000 IUs daily. Verified that she is taking this at her visit 12/10/14. Recheck vitamin D level at that visit. 12/16/15 vitamin D level ---43. Continue current  dose. 04/06/16--- she does continue taking vitamin D 2000 units daily. 07/20/2016: Reviewed that last vitamin D level was 11/2015 and was good at 43 and level prior to that was 38. Can wait to recheck lab. Continue current dose vitamin D.  Glaucoma At [facility] 06/11/2014:She reports that she has seen an eye doctor and has been told she has glaucoma. At [place] 12/10/2014--reviewed that her last eye exam was July 2015. And I discussed that she needs to schedule annual follow-up eye exam she says that she usually goes every 2 years. Then asked about the glaucoma and she says that she had glaucoma about 10 years ago but she had treatment with laser surgery to correct that. At [place] 12/16/15 she reports that her eye doctor is aware of her diabetes and that at her last eye exam a told her no signs of diabetic change. She does continue to follow up with her eye doctor routinely. 07/20/2016: Cont routine eye exams  GERD At office visit 12/16/2015 added omeprazole 20 mg daily. At that visit I also discussed stopping eating and drinking couple hours prior to going to sleep. He states that she already had quit doing all eating and drinking other than taking her nighttime pill with one sip of water and still has reflux when she lies down despite these changes. Also has some reflux symptoms during the day. 04/06/2016--- she states that she tries to go without the omeprazole some but ends up needing it most every day. 07/20/2016:-- she states that she tries to go without the omeprazole some but ends up needing it most every day.  THE FOLLOWING IS COPIED FROM HER CPE NOTE  06/11/2014:  Visit for preventive health examination  A. Screening Labs: - CBC with Differential/Platelet - COMPLETE METABOLIC PANEL WITH GFR - Lipid panel - Hemoglobin A1c - TSH - Vit D  25 hydroxy (rtn osteoporosis monitoring) - MM Digital Screening; Future  B. Pap: She reports that she did see gynecology in the past but that she probably last saw them in the 1990s. Is that back then her Pap smears were all normal. She defers further pelvic exams or Pap smears.  C. Screening Mammogram: 06/11/2014: "She reports that she has had no mammogram since the 1990s. She is agreeable for me to schedule follow-up mammogram." She had mammogram 07/08/14 and further evaluation was needed so they did that 07/14/2014. Negative. ----ADDENDUM ADDED 07/20/2016----She had Mammogram, Biopsy 10/2015-------- D. DEXA/BMD:  06/11/2014: She reports that she has not had a bone density test recently and she is agreeable for me to schedule follow-up bone density test. She had bone density scan 07/08/14. We have already called her regarding those results and gave her instructions. This showed osteopenia. We recommended her continue her weightbearing exercise and also take calcium and vitamin D. Today 12/10/14 she states that she is taking 1500 mg of calcium a day and 2000 units vitamin D daily.  E. Colorectal Cancer Screening: She reports that she had a colonoscopy around 2007 that was normal and was performed by Dr. Collene Mares ---------------------AT 02/24/15 [facility]----I HAD HER SIGN RELEASE TO GET RECORD FROM DR Collene Mares-----------------------  F. Immunizations:  Influenza: ----Influenza vaccine given here 06/11/14 Tetanus: ----T dap given here 06/11/14 Pneumococcal:---- Prevnar 13 given here 06/11/14   ------Pneumovax 23--Given here 12/10/2014 Zostavax:  She did go get the Zostavax at the pharmacy. Sometime between the 2016 and 2017 at [place].  She is to schedule follow-up office visit 3 months Follow-up sooner if  needed.  Ria Clock  [place], [place], [facility] 07/20/2016 8:08 AM

## 2016-07-21 LAB — COMPLETE METABOLIC PANEL WITH GFR
ALBUMIN: 4 g/dL (ref 3.6–5.1)
ALK PHOS: 63 U/L (ref 33–130)
ALT: 12 U/L (ref 6–29)
AST: 20 U/L (ref 10–35)
BILIRUBIN TOTAL: 0.4 mg/dL (ref 0.2–1.2)
BUN: 17 mg/dL (ref 7–25)
CO2: 26 mmol/L (ref 20–31)
CREATININE: 1.13 mg/dL — AB (ref 0.60–0.93)
Calcium: 9.2 mg/dL (ref 8.6–10.4)
Chloride: 107 mmol/L (ref 98–110)
GFR, Est African American: 57 mL/min — ABNORMAL LOW (ref >=60)
GFR, Est Non African American: 49 mL/min — ABNORMAL LOW (ref >=60)
GLUCOSE: 141 mg/dL — AB (ref 70–99)
Potassium: 4.7 mmol/L (ref 3.5–5.3)
SODIUM: 141 mmol/L (ref 135–146)
TOTAL PROTEIN: 6.7 g/dL (ref 6.1–8.1)

## 2016-07-21 LAB — LIPID PANEL
Cholesterol: 150 mg/dL (ref <200)
HDL: 42 mg/dL — ABNORMAL LOW (ref >50)
LDL CALC: 85 mg/dL (ref <100)
Total CHOL/HDL Ratio: 3.6 Ratio (ref <5.0)
Triglycerides: 116 mg/dL (ref <150)
VLDL: 23 mg/dL (ref <30)

## 2016-07-21 LAB — HEMOGLOBIN A1C
Hgb A1c MFr Bld: 6.2 % — ABNORMAL HIGH (ref <5.7)
MEAN PLASMA GLUCOSE: 131 mg/dL

## 2016-10-04 ENCOUNTER — Other Ambulatory Visit: Payer: Self-pay | Admitting: Physician Assistant

## 2016-10-04 DIAGNOSIS — I10 Essential (primary) hypertension: Secondary | ICD-10-CM

## 2016-10-04 NOTE — Telephone Encounter (Signed)
Refill appropriate 

## 2016-10-10 DIAGNOSIS — L218 Other seborrheic dermatitis: Secondary | ICD-10-CM | POA: Diagnosis not present

## 2016-10-10 DIAGNOSIS — L821 Other seborrheic keratosis: Secondary | ICD-10-CM | POA: Diagnosis not present

## 2016-10-19 DIAGNOSIS — H524 Presbyopia: Secondary | ICD-10-CM | POA: Diagnosis not present

## 2016-11-02 ENCOUNTER — Encounter: Payer: Self-pay | Admitting: Physician Assistant

## 2016-11-02 ENCOUNTER — Ambulatory Visit (INDEPENDENT_AMBULATORY_CARE_PROVIDER_SITE_OTHER): Payer: Medicare HMO | Admitting: Physician Assistant

## 2016-11-02 VITALS — BP 126/68 | HR 72 | Temp 97.5°F | Resp 16 | Ht 65.0 in | Wt 211.8 lb

## 2016-11-02 DIAGNOSIS — E2839 Other primary ovarian failure: Secondary | ICD-10-CM

## 2016-11-02 DIAGNOSIS — I10 Essential (primary) hypertension: Secondary | ICD-10-CM | POA: Diagnosis not present

## 2016-11-02 DIAGNOSIS — E119 Type 2 diabetes mellitus without complications: Secondary | ICD-10-CM

## 2016-11-02 DIAGNOSIS — E785 Hyperlipidemia, unspecified: Secondary | ICD-10-CM

## 2016-11-02 DIAGNOSIS — E559 Vitamin D deficiency, unspecified: Secondary | ICD-10-CM

## 2016-11-02 DIAGNOSIS — K219 Gastro-esophageal reflux disease without esophagitis: Secondary | ICD-10-CM

## 2016-11-02 DIAGNOSIS — R739 Hyperglycemia, unspecified: Secondary | ICD-10-CM | POA: Diagnosis not present

## 2016-11-02 DIAGNOSIS — M8580 Other specified disorders of bone density and structure, unspecified site: Secondary | ICD-10-CM

## 2016-11-02 DIAGNOSIS — Z780 Asymptomatic menopausal state: Secondary | ICD-10-CM | POA: Diagnosis not present

## 2016-11-02 LAB — HEMOGLOBIN A1C, FINGERSTICK: Hgb A1C (fingerstick): 5.8 % — ABNORMAL HIGH (ref <5.7)

## 2016-11-02 NOTE — Progress Notes (Signed)
Patient ID: Heather Gross MRN: [id number], DOB: 04-25-1946, 71 y.o. Date of Encounter: 11/02/2016,   Chief Complaint: Routine follow-up office visit and lab work.  HPI: 71 y.o. y/o female  here for routine follow-up office visit and lab work.  She was seen on 06/11/2014 as a "new patient"  to reestablish care here. Her last office visit here prior to that was 12/2010.  She said that since she was coming here she has gone to the [facility]. Said that they had been prescribing her 2 blood pressure medications. Said that she had not been diagnosed with any other new medical problems since her visit here.  At that visit 06/11/2014 we did complete physical exam. Did full panel a screening lab work. Updated preventative care.  Labs revealed hyperlipidemia with LDL 169. Patient then started simvastatin 40 mg. Had repeat lipid panel 07/24/14 which showed LDL 106 and LFTs normal.  Labs also revealed A1c 6.7. Planned that I would review carbohydrate handout with her follow-up visit----has been given and reviewed in detail at visit 12/10/2014. Pt seems very interested/motivated in making changes.  Labs also revealed vitamin D deficiency at 24. Recommended she start 2000 units daily.   She continues to do her exercise. She exercises 4 days a week. Goes to a  "seniors exercise class"--- says that this is mostly stretching type exercises. Says that she does go to the facility early and walks the track for 15 minutes prior to the class. No lower extremity claudication symptoms with this and no angina symptoms with this exertion.  At [place] 02/24/2015 she said that she "has cut back on whole lot".  Said that she really likes bread and has really had to cut that back and then as well and potatoes and rice too". However at that visit her weight was only down 1 pound.  12/16/2015: She is still following her low carbohydrate diet. Continues decreased intake of bread potatoes and rice as well as  other carbs. She continues to do her same exercise. Goes to "Seniors Exercise Class" 4 days a week. She has glaucoma so she goes to the eye doctor on a routine basis. She states that her last visit there she did inform them of her diabetes and they said they saw no diabetic changes. Today also discussed with her diabetic foot care and she is aware and educated of this. Today's visit she does state that she has been having acid reflux. Says that she notices it mostly at night when she lies down. Says even when she stops eating 3 hours prior to going to bed and only takes her medicine with a small sip of water prior to going to bed she still feels this reflux at that time. Sometimes during the day. Using Tums and Zantac recently.  AT [place] 12/16/2015:  Added Lisinopril 10mg  QD (ACE Inh) Discontinued HCTZ 12.5 mg. Decreased Norvasc from 10 mg to 5 mg.----- to avoid developing hypotension----she needed ACE inhibitor for renal protection. Also added aspirin 81 mg daily. Also added omeprazole 20 mg daily for GERD. Planned follow-up office visit 2 weeks to recheck blood pressure and BMET after medication changes.  12/30/2015: She did make all the medication changes as listed above.  Says that she has had no adverse effects and has been feeling just fine. Absolutely no lower extremity edema.  She is taking the aspirin daily.  She is taking the omeprazole. She says that that has made her feel so much better  and she is so appreciative for that medication.  Did go get the Zostavax as well.  04/06/2016: She is taking blood pressure medications as directed. No lightheadedness or other adverse effects. She is taking simvastatin as directed. No myalgias or other adverse effects. She is taking vitamin D supplement.--2,000 units daily. Taking Calcium supplement also. She states that she is taking the omeprazole almost every day. She tries to skip it and not have to use it all the time but usually has heartburn  symptoms that she does not send a taking it most every day. She says that she is trying to be careful with her diet and her bread and carbohydrates. However noted that weight today is up to 208 pounds. Told her to be careful especially now with the holidays. She is still going to her seniors exercise class 4 days a week.   07/20/2016: Today she reports that she has been having urinary frequency and nocturia and urgency for several months. Says that she is having to go to the bathroom multiple times at night. She occasionally leaks a little bit of urine if she doesn't get to the bathroom fast enough. Urge incontinence. No stress incontinence. Does not report any urine leakage with coughing sneezing and laughing. She is taking blood pressure medications as directed. No lightheadedness or other adverse effects. She is taking simvastatin as directed. No myalgias or other adverse effects. She is taking vitamin D supplement.--2,000 units daily. Taking Calcium supplement also. She states that she is taking the omeprazole almost every day. She tries to skip it and not have to use it all the time but usually has heartburn symptoms that she does not send a taking it most every day. She says that she is trying to be careful with her diet and her bread and carbohydrates.  She is still going to her seniors exercise class 4 days a week. At [place] 03/2016 noted that weight was up to 208 pounds. Today weight stable at 208.   11/02/2016: At last office visit added Myrbetriq for her urinary symptoms. Today she reports that she is taking this daily and that it is working very well. It is completely controlling those symptoms and is causing no adverse effects. She is taking blood pressure medications as directed. No lightheadedness or other adverse effects. She is taking simvastatin as directed. No myalgias or other adverse effects. She is taking vitamin D supplement.--2,000 units daily. Taking Calcium supplement also. She  states that she is taking the omeprazole almost every day. She tries to skip it and not have to use it all the time but usually has heartburn symptoms that she does not send a taking it most every day.  She is still going to her seniors exercise class 4 days a week.  At prior visits she said that she was "trying to be careful with her diet and her bread and carbohydrates" At her [facility] 03/2016 discussed with her that her weight was up to 208. At visit 07/20/16 discussed the weight was stable at 208. At visit 11/02/16 reviewed with her that weight is up to 211.8. She states that she does continue her senior exercise classes 4 days a week.  However admits that she is not doing a very good job with her diet. She has been educated and knows what she is supposed to do and what foods she needs to limit,  but is having a hard time putting this into action. She has no concerns to address today.  Review of Systems: Consitutional: No fever, chills, fatigue, night sweats, lymphadenopathy. No significant/unexplained weight changes. Eyes: No visual changes, eye redness, or discharge. ENT/Mouth: No ear pain, sore throat, nasal drainage, or sinus pain. Cardiovascular: No chest pressure,heaviness, tightness or squeezing, even with exertion. No increased shortness of breath or dyspnea on exertion.No palpitations, edema, orthopnea, PND. Respiratory: No cough, hemoptysis, SOB, or wheezing. Gastrointestinal: No anorexia, dysphagia, reflux, pain, nausea, vomiting, hematemesis, diarrhea, constipation, BRBPR, or melena. Breast: No mass, nodules, bulging, or retraction. No skin changes or inflammation. No nipple discharge. No lymphadenopathy. Genitourinary: No dysuria, hematuria, incontinence, vaginal discharge, pruritis, burning, abnormal bleeding, or pain. Musculoskeletal: No decreased ROM, No joint pain or swelling. No significant pain in neck, back, or extremities. Skin: No rash, pruritis, or concerning  lesions. Neurological: No headache, dizziness, syncope, seizures, tremors, memory loss, coordination problems, or paresthesias. Psychological: No anxiety, depression, hallucinations, SI/HI. Endocrine: No polydipsia, polyphagia, polyuria, or known diabetes.No increased fatigue. No palpitations/rapid heart rate. No significant/unexplained weight change. All other systems were reviewed and are otherwise negative.  Past Medical History:  Diagnosis Date  . Glaucoma   . Hyperlipidemia   . Hypertension      Past Surgical History:  Procedure Laterality Date  . BREAST LUMPECTOMY WITH RADIOACTIVE SEED LOCALIZATION Right 11/04/2015   Procedure: BREAST LUMPECTOMY WITH RADIOACTIVE SEED LOCALIZATION;  Surgeon: Autumn Messing III, MD;  Location: [facility];  Service: General;  Laterality: Right;  . LIPOMA EXCISION      Home Meds:  Outpatient Medications Prior to Visit  Medication Sig Dispense Refill  . amLODipine (NORVASC) 10 MG tablet TAKE 1 TABLET BY MOUTH EVERY DAY 90 tablet 0  . aspirin EC 81 MG tablet Take 1 tablet (81 mg total) by mouth daily. 30 tablet 11  . Cholecalciferol (VITAMIN D) 2000 UNITS tablet Take 2,000 Units by mouth daily.    Marland Kitchen lisinopril (PRINIVIL,ZESTRIL) 10 MG tablet TAKE 1 TABLET (10 MG TOTAL) BY MOUTH DAILY. 90 tablet 1  . mirabegron ER (MYRBETRIQ) 50 MG TB24 tablet Take 1 tablet (50 mg total) by mouth daily. 30 tablet 11  . Multiple Vitamin (MULTIVITAMIN) tablet Take 1 tablet by mouth daily.    Marland Kitchen omeprazole (PRILOSEC) 20 MG capsule Take 1 capsule (20 mg total) by mouth daily. 90 capsule 2  . simvastatin (ZOCOR) 40 MG tablet TAKE 1 TABLET BY MOUTH AT BEDTIME 90 tablet 0   No facility-administered medications prior to visit.     Allergies:  Allergies  Allergen Reactions  . Latex   . Lipitor [Atorvastatin] Other (See Comments)    Made mouth very dry and lips discolored    Social History   Social History  . Marital status: Married    Spouse name: N/A  .  Number of children: N/A  . Years of education: N/A   Occupational History  . Not on file.   Social History Main Topics  . Smoking status: Former Smoker    Quit date: 05/01/1990  . Smokeless tobacco: Never Used  . Alcohol use No  . Drug use: No  . Sexual activity: Not Currently   Other Topics Concern  . Not on file   Social History Narrative   Entered 06/2014:    Married x 48 years.   2 Children--2 sons   She exercises 4 days a week. Goes to a  "seniors exercise class"--- says that this is mostly stretching type exercises.  Says that she does go to the facility early and walks the track for  15 minutes prior to the class. No lower extremity claudication symptoms with this and no angina symptoms with this exertion.  Family History  Problem Relation Age of Onset  . Diabetes Sister   . Diabetes Brother   . Diabetes Sister   . Diabetes Sister   . Diabetes Sister   . Diabetes Sister     Physical Exam: Blood pressure 126/68, pulse 72, temperature (!) 97.5 F (36.4 C), temperature source Oral, resp. rate 16, height 5\' 5"  (1.651 m), weight 211 lb 12.8 oz (96.1 kg), SpO2 98 %., Body mass index is 35.25 kg/m. General: Obese AA Female. Appears in no acute distress. Neck: Supple. Trachea midline. No thyromegaly. Full ROM. No lymphadenopathy.No Carotid Bruits. Left Neck---protrusion--soft, mobile---there is scar over this---she had lipoma removed there---says that she also had lipoma on her back removed and it re-occurred. Lungs: Clear to auscultation bilaterally without wheezes, rales, or rhonchi. Breathing is of normal effort and unlabored. Cardiovascular: RRR with S1 S2. No murmurs, rubs, or gallops.   Abdomen: Soft, non-tender, non-distended with normoactive bowel sounds. No hepatosplenomegaly or masses. No rebound/guarding. No CVA tenderness. No hernias.  Musculoskeletal: Full range of motion and 5/5 strength throughout.  Extremities without  edema. Skin: Warm and moist without  erythema, ecchymosis, wounds, or rash. Diabetic Foot Exam: She does have a callus on the plantar surface of her right foot at the "ball of her foot " Remainder of inspection is normal. Sensation intact. Dorsalis pedis and posterior tibial pulses 2+ throughout. Neuro: A+Ox3. CN II-XII grossly intact. Moves all extremities spontaneously. Full sensation throughout. Normal gait.  Psych:  Responds to questions appropriately with a normal affect.   Assessment/Plan:  71 y.o. y/o female here for    Diabetes mellitus without complication When she was seen here as a patient in the past she had hyperglycemia and newly diagnosed diabetes.  When she came back here as to reestablish care with [place] 06/11/14 A1c was 6.7. At office visit 12/10/14 carbohydrate handout was reviewed with her in detail. In addition to the carbohydrates that she needs to limit, also discussed foods that she can eat. Repeat A1c 12/10/2014.  02/24/15---I reviewed that at her last visit I gave and reviewed the carbohydrate diet handout. Today she says that she "has cut back on whole lot". Asked her if there were any specific foods that she realizes she was consuming too much of that she has really made a big change in.  She says that she really likes bread and has really had to cut that back and then as well and potatoes and rice too". However I did review her weight--- at the visit 12/10/14 weight was 203.  02/24/15 ---202. Would've expected more weight loss than this if she had made significant diet changes and really hadn't eaten that much of these foods prior to the diet changes 04/06/2016--weight 208 lb 06/2016------208 11/02/2016---211.8  11/02/2016:She is on Statin. 11/02/2016:She is now on aspirin 81 mg daily. 11/02/2016:She is now on ACE inhibitor lisinopril 10 mg daily.  11/02/2016:Microalbumin checked at visit 12/16/15---normal  At visit 12/16/15 discussed the fact that she already has routine eye exams as she has glaucoma. Says her eye  doctor is aware that she has diabetes and they told her she had no signs of diabetic retinopathy.  At visit 12/16/15 discussed proper foot care. She is educated about this and aware to avoid any type of wound to the foot.   Hyperlipidemia When she was seen here as a patient in the  past she had hyperlipidemia.  When she was seen here 06/11/2014 she was not on any medication for this.  06/11/2014 lipid panel showed LDL 169. She then started simvastatin 40 mg. Follow-up lipid panel 07/24/14 showed LDL 106. Continued simvastatin 40 mg. Rechecked  FLP and LFT  12/10/14.----- labs were stable. She was to continue current medication and follow low carbohydrate handout 11/2015---FLP at goal 04/06/2016--- reviewed that lipid panel has been at goal. LDL was 72 and then at 96 at recheck. LFTis normal. Continue current dose of statin. 07/20/2016: She is fasting. Recheck FLP/LFT 11/02/2016: FLP LFT were good 3/18. Continue current dose statin.  HYPERTENSION, BENIGN SYSTEMIC: 11/02/2016:   Blood pressure is at goal and controlled today. She is on ACE Inh for renal protection, given her diabetes. BMET normal/stable at last check     Vitamin D deficiency - DG Bone Density; Future  Osteopenia, unspecified location - DG Bone Density; Future  Postmenopausal - DG Bone Density; Future  Estrogen deficiency - DG Bone Density; Future  11/02/2016:Last bone density scan was 07/08/2014. Will schedule for follow-up bone density scan.    OAB (overactive bladder) 07/20/2016: At visit today she reports symptoms consistent with overactive bladder and urge incontinence. Will start Myrbetriq. She is to call me if this causes adverse effects or does not control her symptoms. - mirabegron ER (MYRBETRIQ) 50 MG TB24 tablet; Take 1 tablet (50 mg total) by mouth daily.  Dispense: 30 tablet; Refill: 11 11/02/2016: Symptoms are controlled with current medication. Causing no adverse effects. Continue Myrbetriq  Vitamin D deficiency -  Vit D  25 hydroxy (rtn osteoporosis monitoring) 06/11/14 labs vitamin D was 22. At that time she did start vitamin D 2000 IUs daily. Verified that she is taking this at her visit 12/10/14. Recheck vitamin D level at that visit. 12/16/15 vitamin D level ---43. Continue current dose. 04/06/16--- she does continue taking vitamin D 2000 units daily. 11/02/2016:: Reviewed that last vitamin D level was 11/2015 and was good at 43 and level prior to that was 38. Can wait to recheck lab. Continue current dose vitamin D.  Glaucoma At [facility] 06/11/2014:She reports that she has seen an eye doctor and has been told she has glaucoma. At [place] 12/10/2014--reviewed that her last eye exam was July 2015. And I discussed that she needs to schedule annual follow-up eye exam she says that she usually goes every 2 years. Then asked about the glaucoma and she says that she had glaucoma about 10 years ago but she had treatment with laser surgery to correct that. At [place] 12/16/15 she reports that her eye doctor is aware of her diabetes and that at her last eye exam a told her no signs of diabetic change. She does continue to follow up with her eye doctor routinely. 11/02/2016:: Cont routine eye exams  GERD At office visit 12/16/2015 added omeprazole 20 mg daily. At that visit I also discussed stopping eating and drinking couple hours prior to going to sleep. He states that she already had quit doing all eating and drinking other than taking her nighttime pill with one sip of water and still has reflux when she lies down despite these changes. Also has some reflux symptoms during the day. 04/06/2016--- she states that she tries to go without the omeprazole some but ends up needing it most every day. 11/02/2016::-- she states that she tries to go without the omeprazole some but ends up needing it most every day.  THE FOLLOWING IS COPIED FROM  HER CPE NOTE 06/11/2014:  Visit for preventive health examination  A. Screening Labs: - CBC with  Differential/Platelet - COMPLETE METABOLIC PANEL WITH GFR - Lipid panel - Hemoglobin A1c - TSH - Vit D  25 hydroxy (rtn osteoporosis monitoring) - MM Digital Screening; Future  B. Pap: She reports that she did see gynecology in the past but that she probably last saw them in the 1990s. Is that back then her Pap smears were all normal. She defers further pelvic exams or Pap smears.  C. Screening Mammogram: 06/11/2014: "She reports that she has had no mammogram since the 1990s. She is agreeable for me to schedule follow-up mammogram." She had mammogram 07/08/14 and further evaluation was needed so they did that 07/14/2014. Negative. ----ADDENDUM ADDED 07/20/2016----She had Mammogram, Biopsy 10/2015--------  D. DEXA/BMD:  06/11/2014: She reports that she has not had a bone density test recently and she is agreeable for me to schedule follow-up bone density test. She had bone density scan 07/08/14. We have already called her regarding those results and gave her instructions. This showed osteopenia. We recommended her continue her weightbearing exercise and also take calcium and vitamin D. Today 12/10/14 she states that she is taking 1500 mg of calcium a day and 2000 units vitamin D daily.  E. Colorectal Cancer Screening: She reports that she had a colonoscopy around 2007 that was normal and was performed by Dr. Collene Mares ---------------------AT 02/24/15 [facility]----I HAD HER SIGN RELEASE TO GET RECORD FROM DR Collene Mares-----------------------  F. Immunizations:  Influenza: ----Influenza vaccine given here 06/11/14 Tetanus: ----T dap given here 06/11/14 Pneumococcal:---- Prevnar 13 given here 06/11/14   ------Pneumovax 23--Given here 12/10/2014 Zostavax:  She did go get the Zostavax at the pharmacy. Sometime between the 2016 and 2017 at [place].  She is to schedule follow-up office visit 3 months Follow-up sooner if needed.  Signed, [id number] [place], [place], [facility] 11/02/2016 8:39 AM

## 2016-11-16 ENCOUNTER — Other Ambulatory Visit: Payer: Self-pay | Admitting: Physician Assistant

## 2016-11-16 DIAGNOSIS — Z1231 Encounter for screening mammogram for malignant neoplasm of breast: Secondary | ICD-10-CM

## 2016-11-22 ENCOUNTER — Other Ambulatory Visit: Payer: Commercial Managed Care - HMO

## 2016-12-06 ENCOUNTER — Ambulatory Visit
Admission: RE | Admit: 2016-12-06 | Discharge: 2016-12-06 | Disposition: A | Discharge status: Home / Self Care | Payer: Commercial Managed Care - HMO | Source: Ambulatory Visit | Attending: Physician Assistant | Admitting: Physician Assistant

## 2016-12-06 ENCOUNTER — Other Ambulatory Visit: Payer: Self-pay | Admitting: Physician Assistant

## 2016-12-06 ENCOUNTER — Ambulatory Visit
Admission: RE | Admit: 2016-12-06 | Discharge: 2016-12-06 | Disposition: A | Discharge status: Home / Self Care | Payer: Commercial Managed Care - HMO | Source: Ambulatory Visit

## 2016-12-06 DIAGNOSIS — Z780 Asymptomatic menopausal state: Secondary | ICD-10-CM

## 2016-12-06 DIAGNOSIS — E559 Vitamin D deficiency, unspecified: Secondary | ICD-10-CM

## 2016-12-06 DIAGNOSIS — Z1231 Encounter for screening mammogram for malignant neoplasm of breast: Secondary | ICD-10-CM | POA: Diagnosis not present

## 2016-12-06 DIAGNOSIS — M8580 Other specified disorders of bone density and structure, unspecified site: Secondary | ICD-10-CM

## 2016-12-06 DIAGNOSIS — E2839 Other primary ovarian failure: Secondary | ICD-10-CM

## 2016-12-06 DIAGNOSIS — Z13820 Encounter for screening for osteoporosis: Secondary | ICD-10-CM | POA: Diagnosis not present

## 2016-12-06 IMAGING — MG 2D DIGITAL SCREENING BILATERAL MAMMOGRAM WITH CAD AND ADJUNCT TO
8 of 13 series · 8 of 29 positions shown · non-contrast
Comparison: Previous exam(s).

CLINICAL DATA: Screening.

EXAM:
2D DIGITAL SCREENING BILATERAL MAMMOGRAM WITH CAD AND ADJUNCT TOMO

[R CC (1 of 2)]
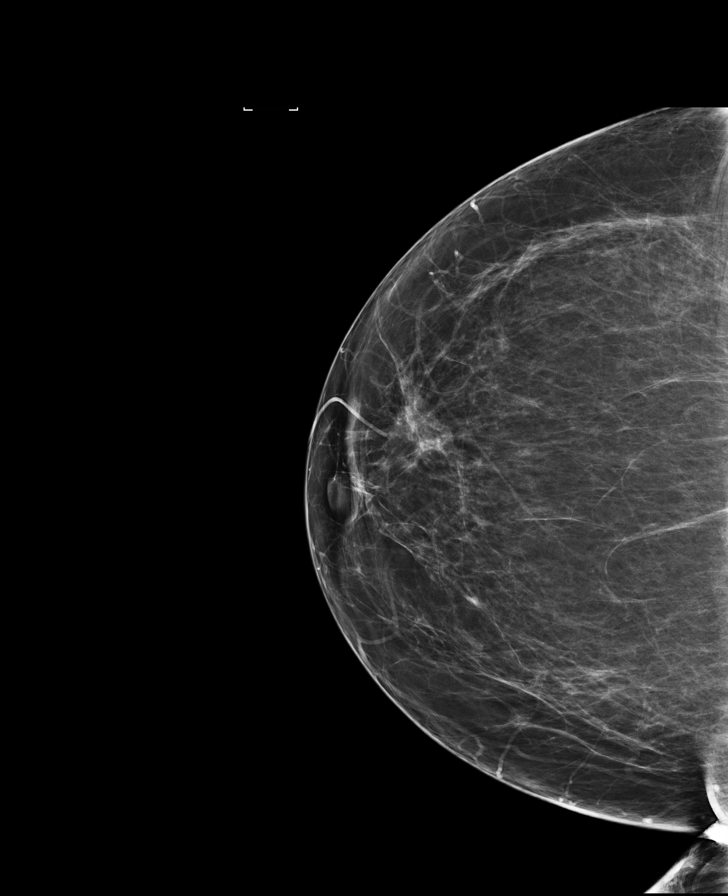

[R MLO synth-2D]
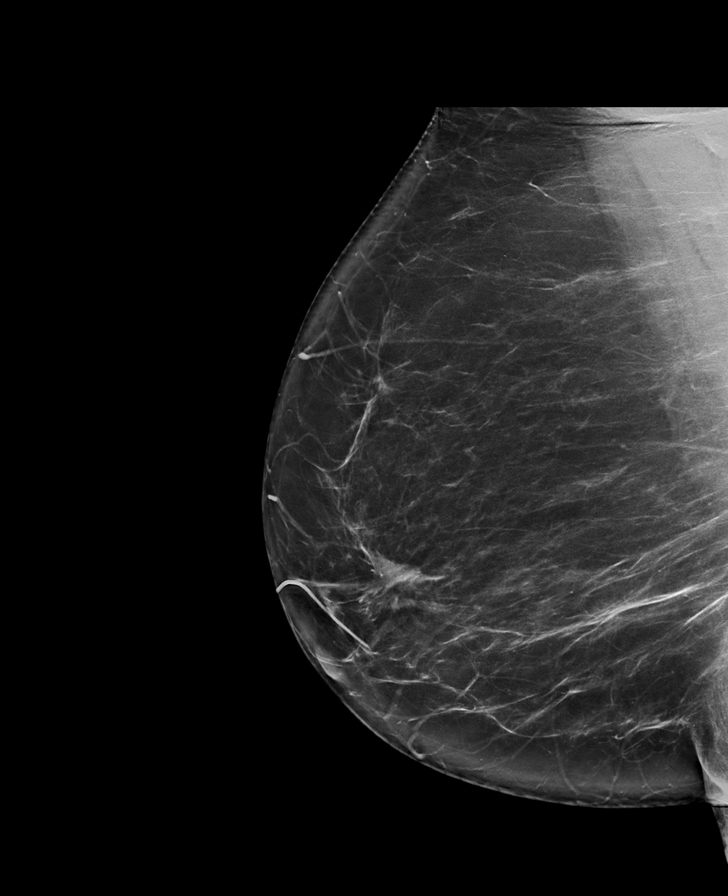

[L MLO]
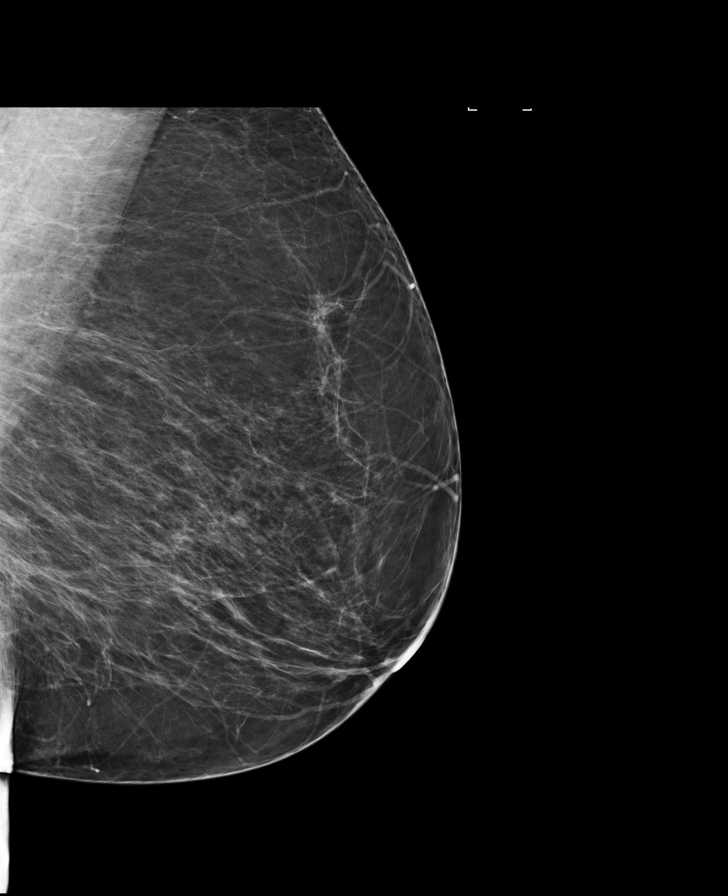

[L CC]
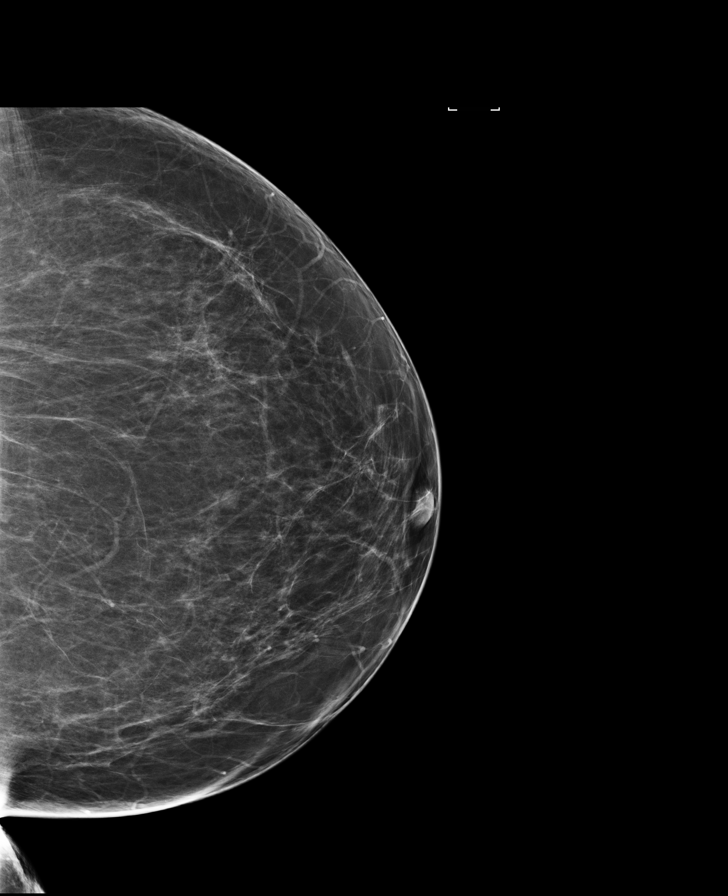

[R CC (2 of 2)]
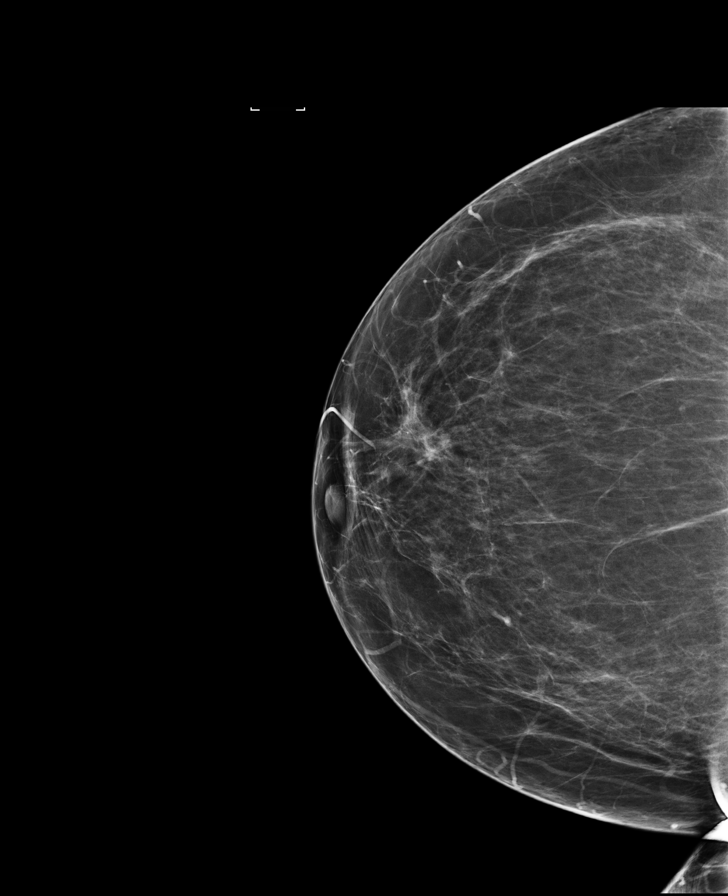

[R CC synth-2D]
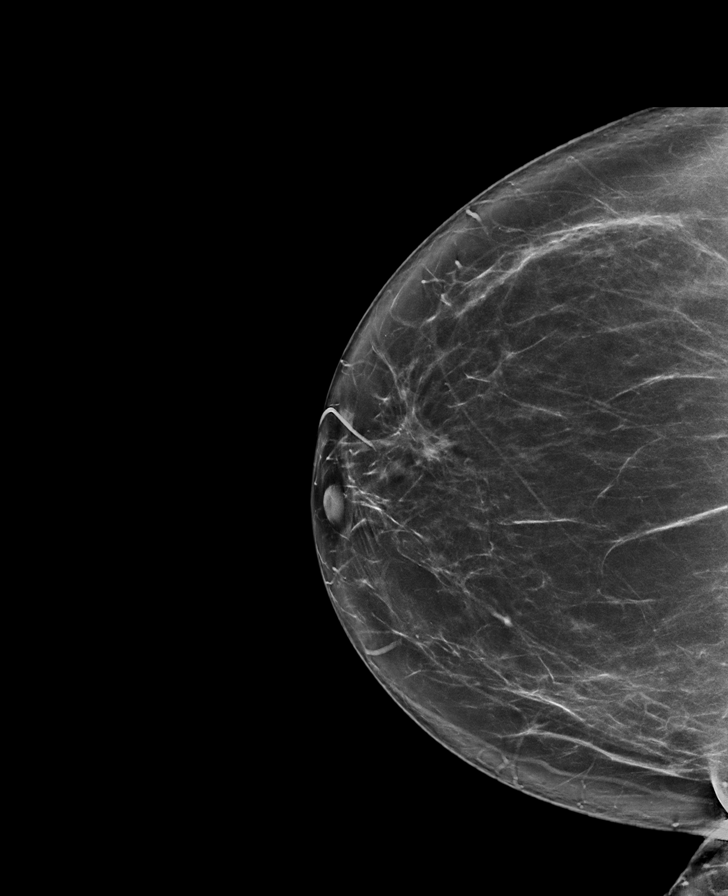

[L MLO synth-2D]
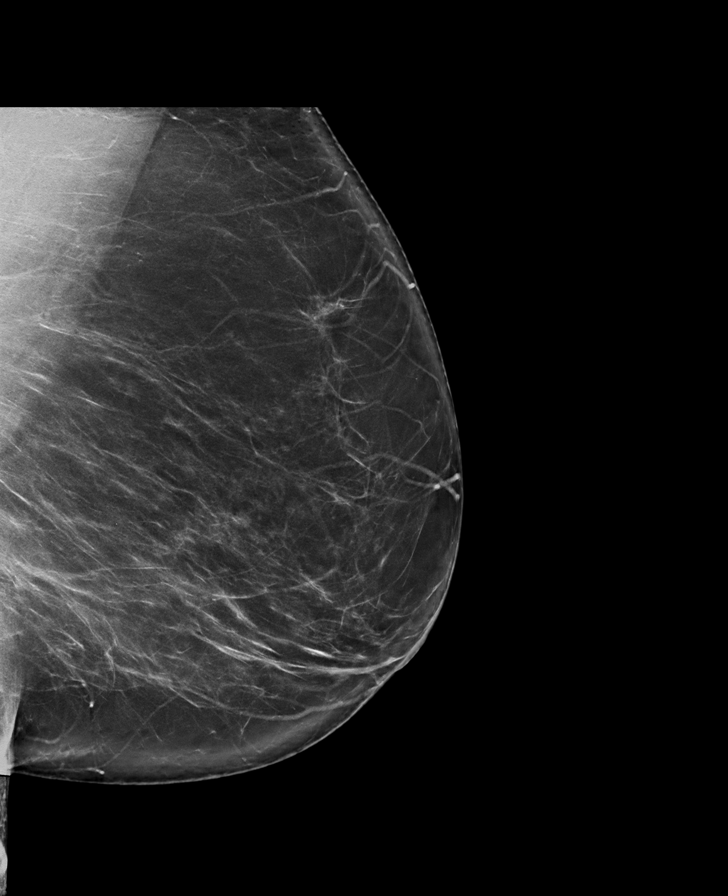

[R MLO]
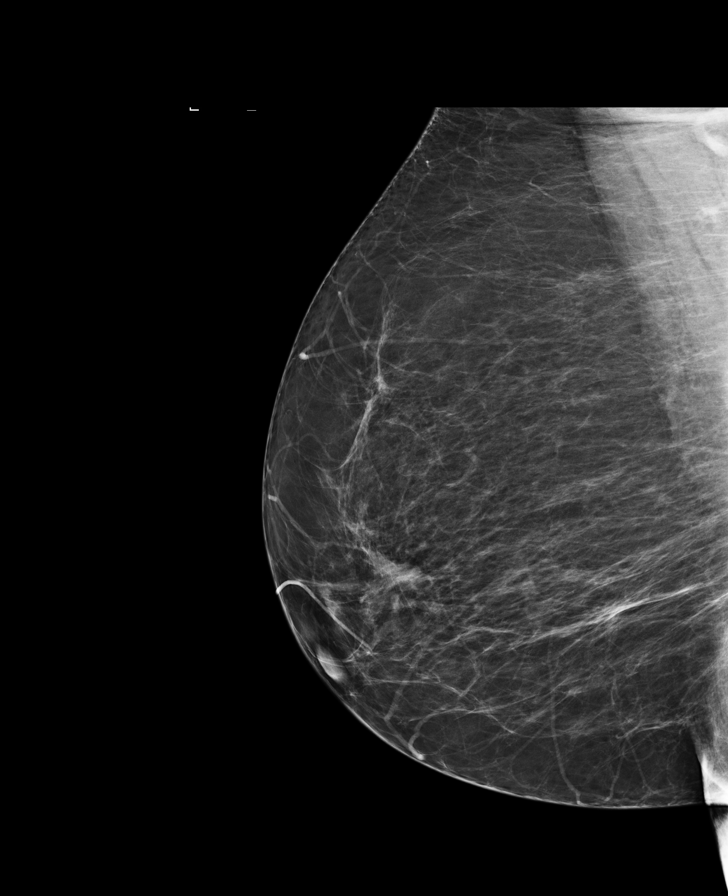

[8 of 29 positions shown; findings below may reference images not displayed]

ACR Breast Density Category c: The breast tissue is heterogeneously
dense, which may obscure small masses.
FINDINGS: There are no findings suspicious for malignancy. Images were
processed with CAD.
IMPRESSION: No mammographic evidence of malignancy. A result letter of this
screening mammogram will be mailed directly to the patient.

RECOMMENDATION:
Screening mammogram in one year. (Code:[TA])

BI-RADS CATEGORY  1: Negative.

## 2016-12-09 ENCOUNTER — Other Ambulatory Visit: Payer: Self-pay | Admitting: Physician Assistant

## 2016-12-09 DIAGNOSIS — I10 Essential (primary) hypertension: Secondary | ICD-10-CM

## 2016-12-11 NOTE — Telephone Encounter (Signed)
Refill appropriate 

## 2016-12-13 ENCOUNTER — Other Ambulatory Visit: Payer: Self-pay

## 2016-12-13 MED ORDER — CALCIUM 500 MG PO CHEW: 2.0000 | CHEWABLE_TABLET | Freq: Every day | ORAL | 0 refills | Status: AC

## 2016-12-14 ENCOUNTER — Telehealth: Payer: Self-pay

## 2016-12-14 NOTE — Telephone Encounter (Signed)
The amlodipine is supposed to be 5 mg daily.

## 2016-12-14 NOTE — Telephone Encounter (Signed)
LVM for patient with this info

## 2016-12-14 NOTE — Telephone Encounter (Signed)
Patiet states she was told to go back  On amlodipine 10 mg. I explained to patient is showed the b/p med was decreased from 10mg  to 5mg  Pls confirm

## 2017-01-02 ENCOUNTER — Other Ambulatory Visit: Payer: Self-pay | Admitting: Physician Assistant

## 2017-01-02 DIAGNOSIS — K219 Gastro-esophageal reflux disease without esophagitis: Secondary | ICD-10-CM

## 2017-01-09 ENCOUNTER — Other Ambulatory Visit: Payer: Self-pay | Admitting: Physician Assistant

## 2017-01-09 DIAGNOSIS — E119 Type 2 diabetes mellitus without complications: Secondary | ICD-10-CM

## 2017-01-20 ENCOUNTER — Other Ambulatory Visit: Payer: Self-pay | Admitting: Physician Assistant

## 2017-01-22 NOTE — Telephone Encounter (Signed)
Refill appropriate 

## 2017-02-05 ENCOUNTER — Ambulatory Visit (INDEPENDENT_AMBULATORY_CARE_PROVIDER_SITE_OTHER): Payer: Medicare HMO | Admitting: Physician Assistant

## 2017-02-05 ENCOUNTER — Encounter: Payer: Self-pay | Admitting: Physician Assistant

## 2017-02-05 VITALS — BP 148/86 | HR 68 | Temp 97.9°F | Resp 18 | Wt 210.0 lb

## 2017-02-05 DIAGNOSIS — I10 Essential (primary) hypertension: Secondary | ICD-10-CM

## 2017-02-05 DIAGNOSIS — E559 Vitamin D deficiency, unspecified: Secondary | ICD-10-CM | POA: Diagnosis not present

## 2017-02-05 DIAGNOSIS — E119 Type 2 diabetes mellitus without complications: Secondary | ICD-10-CM | POA: Diagnosis not present

## 2017-02-05 DIAGNOSIS — M8580 Other specified disorders of bone density and structure, unspecified site: Secondary | ICD-10-CM | POA: Diagnosis not present

## 2017-02-05 DIAGNOSIS — Z1159 Encounter for screening for other viral diseases: Secondary | ICD-10-CM | POA: Diagnosis not present

## 2017-02-05 DIAGNOSIS — N183 Chronic kidney disease, stage 3 unspecified: Secondary | ICD-10-CM

## 2017-02-05 DIAGNOSIS — Z23 Encounter for immunization: Secondary | ICD-10-CM

## 2017-02-05 DIAGNOSIS — N1831 Chronic kidney disease, stage 3a: Secondary | ICD-10-CM | POA: Insufficient documentation

## 2017-02-05 DIAGNOSIS — K219 Gastro-esophageal reflux disease without esophagitis: Secondary | ICD-10-CM

## 2017-02-05 DIAGNOSIS — N3281 Overactive bladder: Secondary | ICD-10-CM | POA: Diagnosis not present

## 2017-02-05 DIAGNOSIS — E785 Hyperlipidemia, unspecified: Secondary | ICD-10-CM

## 2017-02-05 DIAGNOSIS — N182 Chronic kidney disease, stage 2 (mild): Secondary | ICD-10-CM | POA: Insufficient documentation

## 2017-02-05 MED ORDER — VITAMIN D 50 MCG (2000 UT) PO TABS: 2000.0000 [IU] | ORAL_TABLET | Freq: Every day | ORAL | 2 refills | Status: DC

## 2017-02-05 MED ORDER — OMEPRAZOLE 20 MG PO CPDR: 20.0000 mg | DELAYED_RELEASE_CAPSULE | Freq: Every day | ORAL | 2 refills | Status: DC

## 2017-02-05 MED ORDER — AMLODIPINE BESYLATE 5 MG PO TABS: 5.0000 mg | ORAL_TABLET | Freq: Every day | ORAL | 3 refills | Status: DC

## 2017-02-05 MED ORDER — LISINOPRIL 10 MG PO TABS: 10.0000 mg | ORAL_TABLET | Freq: Every day | ORAL | 1 refills | Status: DC

## 2017-02-05 MED ORDER — MIRABEGRON ER 50 MG PO TB24: 50.0000 mg | ORAL_TABLET | Freq: Every day | ORAL | 2 refills | Status: DC

## 2017-02-05 MED ORDER — SIMVASTATIN 40 MG PO TABS
40.0000 mg | ORAL_TABLET | Freq: Every day | ORAL | 2 refills | Status: DC
Start: 2017-02-05 — End: 2018-02-20

## 2017-02-05 NOTE — Progress Notes (Signed)
Patient ID: HADIYAH MARICLE MRN: [id number], DOB: [birth year], 71 y.o. Date of Encounter: 02/05/2017,   Chief Complaint: Routine follow-up office visit and lab work.  HPI: 71 y.o. y/o female  here for routine follow-up office visit and lab work.  She was seen on 06/11/2014 as a "new patient"  to reestablish care here. Her last office visit here prior to that was 12/2010.  She said that since she was coming here she has gone to the [facility]. Said that they had been prescribing her 2 blood pressure medications. Said that she had not been diagnosed with any other new medical problems since her visit here.  At that visit 06/11/2014 we did complete physical exam. Did full panel a screening lab work. Updated preventative care.  Labs revealed hyperlipidemia with LDL 169. Patient then started simvastatin 40 mg. Had repeat lipid panel 07/24/14 which showed LDL 106 and LFTs normal.  Labs also revealed A1c 6.7. Planned that I would review carbohydrate handout with her follow-up visit----has been given and reviewed in detail at visit 12/10/2014. Pt seems very interested/motivated in making changes.  Labs also revealed vitamin D deficiency at 70. Recommended she start 2000 units daily.   She continues to do her exercise. She exercises 4 days a week. Goes to a  "seniors exercise class"--- says that this is mostly stretching type exercises. Says that she does go to the facility early and walks the track for 15 minutes prior to the class. No lower extremity claudication symptoms with this and no angina symptoms with this exertion.  At [place] 02/24/2015 she said that she "has cut back on whole lot".  Said that she really likes bread and has really had to cut that back and then as well and potatoes and rice too". However at that visit her weight was only down 1 pound.  12/16/2015: She is still following her low carbohydrate diet. Continues decreased intake of bread potatoes and rice as well  as other carbs. She continues to do her same exercise. Goes to "Seniors Exercise Class" 4 days a week. She has glaucoma so she goes to the eye doctor on a routine basis. She states that her last visit there she did inform them of her diabetes and they said they saw no diabetic changes. Today also discussed with her diabetic foot care and she is aware and educated of this. Today's visit she does state that she has been having acid reflux. Says that she notices it mostly at night when she lies down. Says even when she stops eating 3 hours prior to going to bed and only takes her medicine with a small sip of water prior to going to bed she still feels this reflux at that time. Sometimes during the day. Using Tums and Zantac recently.  AT [place] 12/16/2015:  Added Lisinopril 10mg  QD (ACE Inh) Discontinued HCTZ 12.5 mg. Decreased Norvasc from 10 mg to 5 mg.----- to avoid developing hypotension----she needed ACE inhibitor for renal protection. Also added aspirin 81 mg daily. Also added omeprazole 20 mg daily for GERD. Planned follow-up office visit 2 weeks to recheck blood pressure and BMET after medication changes.  12/30/2015: She did make all the medication changes as listed above.  Says that she has had no adverse effects and has been feeling just fine. Absolutely no lower extremity edema.  She is taking the aspirin daily.  She is taking the omeprazole. She says that that has made her feel so much better  and she is so appreciative for that medication.  Did go get the Zostavax as well.  04/06/2016: She is taking blood pressure medications as directed. No lightheadedness or other adverse effects. She is taking simvastatin as directed. No myalgias or other adverse effects. She is taking vitamin D supplement.--2,000 units daily. Taking Calcium supplement also. She states that she is taking the omeprazole almost every day. She tries to skip it and not have to use it all the time but usually has heartburn  symptoms that she does not send a taking it most every day. She says that she is trying to be careful with her diet and her bread and carbohydrates. However noted that weight today is up to 208 pounds. Told her to be careful especially now with the holidays. She is still going to her seniors exercise class 4 days a week.   07/20/2016: Today she reports that she has been having urinary frequency and nocturia and urgency for several months. Says that she is having to go to the bathroom multiple times at night. She occasionally leaks a little bit of urine if she doesn't get to the bathroom fast enough. Urge incontinence. No stress incontinence. Does not report any urine leakage with coughing sneezing and laughing. She is taking blood pressure medications as directed. No lightheadedness or other adverse effects. She is taking simvastatin as directed. No myalgias or other adverse effects. She is taking vitamin D supplement.--2,000 units daily. Taking Calcium supplement also. She states that she is taking the omeprazole almost every day. She tries to skip it and not have to use it all the time but usually has heartburn symptoms that she does not send a taking it most every day. She says that she is trying to be careful with her diet and her bread and carbohydrates.  She is still going to her seniors exercise class 4 days a week. At [place] 03/2016 noted that weight was up to 208 pounds. Today weight stable at 208.   11/02/2016: At last office visit added Myrbetriq for her urinary symptoms. Today she reports that she is taking this daily and that it is working very well. It is completely controlling those symptoms and is causing no adverse effects. She is taking blood pressure medications as directed. No lightheadedness or other adverse effects. She is taking simvastatin as directed. No myalgias or other adverse effects. She is taking vitamin D supplement.--2,000 units daily. Taking Calcium supplement also. She  states that she is taking the omeprazole almost every day. She tries to skip it and not have to use it all the time but usually has heartburn symptoms that she does not send a taking it most every day.  She is still going to her seniors exercise class 4 days a week.  At prior visits she said that she was "trying to be careful with her diet and her bread and carbohydrates" At her OV 03/2016 discussed with her that her weight was up to 208. At visit 07/20/16 discussed the weight was stable at 208. At visit 11/02/16 reviewed with her that weight is up to 211.8. She states that she does continue her senior exercise classes 4 days a week.  However admits that she is not doing a very good job with her diet. She has been educated and knows what she is supposed to do and what foods she needs to limit,  but is having a hard time putting this into action. She has no concerns to address today.  02/05/2017: Today she reports that she has been feeling good. She has no specific concerns to address today. She reports that she continues to go to her seniors exercise class 4 days a week. She continues to take Myrbetriq for her urinary symptoms. This continues to work well. It is controlling her symptoms and is causing no adverse effects. She is taking blood pressure medications as directed. No lightheadedness or other adverse effects. She is taking simvastatin as directed. No myalgias or other adverse effects. She is taking vitamin D supplement.--2,000 units daily. Taking Calcium supplement also. She states that she is taking the omeprazole almost every day. She tries to skip it and not have to use it all the time but usually has heartburn symptoms that she does not send a taking it most every day.     Review of Systems: Consitutional: No fever, chills, fatigue, night sweats, lymphadenopathy. No significant/unexplained weight changes. Eyes: No visual changes, eye redness, or discharge. ENT/Mouth: No ear pain,  sore throat, nasal drainage, or sinus pain. Cardiovascular: No chest pressure,heaviness, tightness or squeezing, even with exertion. No increased shortness of breath or dyspnea on exertion.No palpitations, edema, orthopnea, PND. Respiratory: No cough, hemoptysis, SOB, or wheezing. Gastrointestinal: No anorexia, dysphagia, reflux, pain, nausea, vomiting, hematemesis, diarrhea, constipation, BRBPR, or melena. Breast: No mass, nodules, bulging, or retraction. No skin changes or inflammation. No nipple discharge. No lymphadenopathy. Genitourinary: No dysuria, hematuria, incontinence, vaginal discharge, pruritis, burning, abnormal bleeding, or pain. Musculoskeletal: No decreased ROM, No joint pain or swelling. No significant pain in neck, back, or extremities. Skin: No rash, pruritis, or concerning lesions. Neurological: No headache, dizziness, syncope, seizures, tremors, memory loss, coordination problems, or paresthesias. Psychological: No anxiety, depression, hallucinations, SI/HI. Endocrine: No polydipsia, polyphagia, polyuria, or known diabetes.No increased fatigue. No palpitations/rapid heart rate. No significant/unexplained weight change. All other systems were reviewed and are otherwise negative.  Past Medical History:  Diagnosis Date  . Glaucoma   . Hyperlipidemia   . Hypertension      Past Surgical History:  Procedure Laterality Date  . BREAST BIOPSY Right 09/29/2015  . BREAST EXCISIONAL BIOPSY Right 11/04/2015   papilloma  . BREAST LUMPECTOMY WITH RADIOACTIVE SEED LOCALIZATION Right 11/04/2015   Procedure: BREAST LUMPECTOMY WITH RADIOACTIVE SEED LOCALIZATION;  Surgeon: Autumn Messing III, MD;  Location: [place];  Service: General;  Laterality: Right;  . LIPOMA EXCISION      Home Meds:  Outpatient Medications Prior to Visit  Medication Sig Dispense Refill  . amLODipine (NORVASC) 5 MG tablet TAKE 1 TABLET BY MOUTH EVERY DAY 90 tablet 3  . ASPIRIN ADULT LOW STRENGTH 81  MG EC tablet TAKE 1 TABLET BY MOUTH EVERY DAY 30 tablet 11  . Calcium 500 MG CHEW Chew 2 tablets (1,000 mg total) by mouth daily. 30 tablet 0  . Cholecalciferol (VITAMIN D) 2000 UNITS tablet Take 2,000 Units by mouth daily.    Marland Kitchen lisinopril (PRINIVIL,ZESTRIL) 10 MG tablet TAKE 1 TABLET (10 MG TOTAL) BY MOUTH DAILY. 90 tablet 1  . mirabegron ER (MYRBETRIQ) 50 MG TB24 tablet Take 1 tablet (50 mg total) by mouth daily. 30 tablet 11  . Multiple Vitamin (MULTIVITAMIN) tablet Take 1 tablet by mouth daily.    Marland Kitchen omeprazole (PRILOSEC) 20 MG capsule TAKE 1 CAPSULE (20 MG TOTAL) BY MOUTH DAILY. 90 capsule 2  . simvastatin (ZOCOR) 40 MG tablet TAKE 1 TABLET BY MOUTH AT BEDTIME 90 tablet 0   No facility-administered medications prior to visit.  Allergies:  Allergies  Allergen Reactions  . Latex   . Lipitor [Atorvastatin] Other (See Comments)    Made mouth very dry and lips discolored    Social History   Social History  . Marital status: Married    Spouse name: N/A  . Number of children: N/A  . Years of education: N/A   Occupational History  . Not on file.   Social History Main Topics  . Smoking status: Former Smoker    Quit date: 05/01/1990  . Smokeless tobacco: Never Used  . Alcohol use No  . Drug use: No  . Sexual activity: Not Currently   Other Topics Concern  . Not on file   Social History Narrative   Entered 06/2014:    Married x 48 years.   2 Children--2 sons   She exercises 4 days a week. Goes to a  "seniors exercise class"--- says that this is mostly stretching type exercises.  Says that she does go to the facility early and walks the track for 15 minutes prior to the class. No lower extremity claudication symptoms with this and no angina symptoms with this exertion.  Family History  Problem Relation Age of Onset  . Diabetes Sister   . Diabetes Brother   . Diabetes Sister   . Diabetes Sister   . Diabetes Sister   . Diabetes Sister     Physical Exam: There were  no vitals taken for this visit., There is no height or weight on file to calculate BMI. General: Obese AA Female. Appears in no acute distress. Neck: Supple. Trachea midline. No thyromegaly. Full ROM. No lymphadenopathy.No Carotid Bruits. Left Neck---protrusion--soft, mobile---there is scar over this---she had lipoma removed there---says that she also had lipoma on her back removed and it re-occurred. Lungs: Clear to auscultation bilaterally without wheezes, rales, or rhonchi. Breathing is of normal effort and unlabored. Cardiovascular: RRR with S1 S2. No murmurs, rubs, or gallops.   Abdomen: Soft, non-tender, non-distended with normoactive bowel sounds. No hepatosplenomegaly or masses. No rebound/guarding. No CVA tenderness. No hernias.  Musculoskeletal: Full range of motion and 5/5 strength throughout.  Extremities without  edema. Skin: Warm and moist without erythema, ecchymosis, wounds, or rash. Diabetic Foot Exam: She does have a callus on the plantar surface of her right foot at the "ball of her foot " Remainder of inspection is normal. Sensation intact. Dorsalis pedis and posterior tibial pulses 2+ throughout. Neuro: A+Ox3. CN II-XII grossly intact. Moves all extremities spontaneously. Full sensation throughout. Normal gait.  Psych:  Responds to questions appropriately with a normal affect.   Assessment/Plan:  71 y.o. y/o female here for    Diabetes mellitus without complication When she was seen here as a patient in the past she had hyperglycemia and newly diagnosed diabetes.  When she came back here as to reestablish care with [place] 06/11/14 A1c was 6.7. At office visit 12/10/14 carbohydrate handout was reviewed with her in detail. In addition to the carbohydrates that she needs to limit, also discussed foods that she can eat. Repeat A1c 12/10/2014.  02/24/15---I reviewed that at her last visit I gave and reviewed the carbohydrate diet handout. Today she says that she "has cut back on whole  lot". Asked her if there were any specific foods that she realizes she was consuming too much of that she has really made a big change in.  She says that she really likes bread and has really had to cut that back and then as well and  potatoes and rice too". However I did review her weight--- at the visit 12/10/14 weight was 203.  02/24/15 ---202. Would've expected more weight loss than this if she had made significant diet changes and really hadn't eaten that much of these foods prior to the diet changes 04/06/2016--weight 208 lb 06/2016------208 11/02/2016---211.8 02/05/2017--210  11/02/2016:She is on Statin. 11/02/2016:She is now on aspirin 81 mg daily. 11/02/2016:She is now on ACE inhibitor lisinopril 10 mg daily.  11/02/2016:Microalbumin checked at visit 12/16/15---normal  At visit 12/16/15 discussed the fact that she already has routine eye exams as she has glaucoma. Says her eye doctor is aware that she has diabetes and they told her she had no signs of diabetic retinopathy.  At visit 12/16/15 discussed proper foot care. She is educated about this and aware to avoid any type of wound to the foot.  02/05/2017: Told her the next time she goes for eye exam to have them to send a copy of their note to [place].  ---------------Also told her to inform them that she does have diabetes even know it is "diet controlled ".  Hyperlipidemia When she was seen here as a patient in the past she had hyperlipidemia.  When she was seen here 06/11/2014 she was not on any medication for this.  06/11/2014 lipid panel showed LDL 169. She then started simvastatin 40 mg. Follow-up lipid panel 07/24/14 showed LDL 106. Continued simvastatin 40 mg. Rechecked  FLP and LFT  12/10/14.----- labs were stable. She was to continue current medication and follow low carbohydrate handout 11/2015---FLP at goal 04/06/2016--- reviewed that lipid panel has been at goal. LDL was 72 and then at 96 at recheck. LFTis normal. Continue current dose of  statin. 07/20/2016: She is fasting. Recheck FLP/LFT 11/02/2016: FLP LFT were good 3/18. Continue current dose statin. 02/05/17--- she is on statin--- she is fasting. Recheck FLP LFT to monitor.  HYPERTENSION, BENIGN SYSTEMIC: 11/02/2016:   Blood pressure is at goal and controlled today. She is on ACE Inh for renal protection, given her diabetes. BMET normal/stable at last check  02/05/17--- blood pressure is slightly high today. However it was excellent at her last visit. 10/2016 BP was 126/68. Therefore at this time will just continue current medications but will monitor. BP --------------elevated again at next visit then we'll adjust medications.   Vitamin D deficiency - DG Bone Density; Future 02/05/17: She had follow-up DEXA scan on 12/07/16. T score -1.3. Osteopenia. Continue calcium, vitamin D, weightbearing exercise.  Osteopenia, unspecified location - DG Bone Density; Future 02/05/17: She had follow-up DEXA scan on 12/07/16. T score -1.3. Osteopenia. Continue calcium, vitamin D, weightbearing exercise.   Postmenopausal - DG Bone Density; Future 02/05/17: She had follow-up DEXA scan on 12/07/16. T score -1.3. Osteopenia. Continue calcium, vitamin D, weightbearing exercise.   Estrogen deficiency - DG Bone Density; Future 02/05/17: She had follow-up DEXA scan on 12/07/16. T score -1.3. Osteopenia. Continue calcium, vitamin D, weightbearing exercise.   OAB (overactive bladder) 07/20/2016: At visit today she reports symptoms consistent with overactive bladder and urge incontinence. Will start Myrbetriq. She is to call me if this causes adverse effects or does not control her symptoms. - mirabegron ER (MYRBETRIQ) 50 MG TB24 tablet; Take 1 tablet (50 mg total) by mouth daily.  Dispense: 30 tablet; Refill: 11 02/05/2017: Symptoms are controlled with current medication. Causing no adverse effects. Continue Myrbetriq  Vitamin D deficiency - Vit D  25 hydroxy (rtn osteoporosis monitoring) 06/11/14 labs  vitamin D was 22. At that  time she did start vitamin D 2000 IUs daily. Verified that she is taking this at her visit 12/10/14. Recheck vitamin D level at that visit. 12/16/15 vitamin D level ---43. Continue current dose. 04/06/16--- she does continue taking vitamin D 2000 units daily. 02/05/2017:: Reviewed that last vitamin D level was 11/2015 and was good at 43 and level prior to that was 38. Has been > 1 year since checked level--recheck now.   Glaucoma At [facility] 06/11/2014:She reports that she has seen an eye doctor and has been told she has glaucoma. At [place] 12/10/2014--reviewed that her last eye exam was July 2015. And I discussed that she needs to schedule annual follow-up eye exam she says that she usually goes every 2 years. Then asked about the glaucoma and she says that she had glaucoma about 10 years ago but she had treatment with laser surgery to correct that. At [place] 12/16/15 she reports that her eye doctor is aware of her diabetes and that at her last eye exam a told her no signs of diabetic change. She does continue to follow up with her eye doctor routinely. 02/05/2017:: Cont routine eye exams  GERD At office visit 12/16/2015 added omeprazole 20 mg daily. At that visit I also discussed stopping eating and drinking couple hours prior to going to sleep. He states that she already had quit doing all eating and drinking other than taking her nighttime pill with one sip of water and still has reflux when she lies down despite these changes. Also has some reflux symptoms during the day. 04/06/2016--- she states that she tries to go without the omeprazole some but ends up needing it most every day. 02/05/2017::-- she states that she tries to go without the omeprazole some but ends up needing it most every day.  THE FOLLOWING IS COPIED FROM HER CPE NOTE 06/11/2014:  Visit for preventive health examination  A. Screening Labs: - CBC with Differential/Platelet - COMPLETE METABOLIC PANEL WITH GFR - Lipid  panel - Hemoglobin A1c - TSH - Vit D  25 hydroxy (rtn osteoporosis monitoring) - MM Digital Screening; Future  B. Pap: She reports that she did see gynecology in the past but that she probably last saw them in the 1990s. Is that back then her Pap smears were all normal. She defers further pelvic exams or Pap smears.  C. Screening Mammogram: 06/11/2014: "She reports that she has had no mammogram since the 1990s. She is agreeable for me to schedule follow-up mammogram." She had mammogram 07/08/14 and further evaluation was needed so they did that 07/14/2014. Negative. ----ADDENDUM ADDED 07/20/2016----She had Mammogram, Biopsy 10/2015--------  D. DEXA/BMD:  06/11/2014: She reports that she has not had a bone density test recently and she is agreeable for me to schedule follow-up bone density test. She had bone density scan 07/08/14. We have already called her regarding those results and gave her instructions. This showed osteopenia. We recommended her continue her weightbearing exercise and also take calcium and vitamin D. Today 12/10/14 she states that she is taking 1500 mg of calcium a day and 2000 units vitamin D daily.  E. Colorectal Cancer Screening: She reports that she had a colonoscopy around 2007 that was normal and was performed by Dr. Collene Mares ---------------------AT 02/24/15 OV----I HAD HER SIGN RELEASE TO GET RECORD FROM DR Collene Mares-----------------------  F. Immunizations:  Influenza: ----Influenza vaccine given here 06/11/14 Tetanus: ----T dap given here 06/11/14 Pneumococcal:---- Prevnar 13 given here 06/11/14   ------Pneumovax 23--Given here 12/10/2014 Zostavax:  She  did go get the Zostavax at the pharmacy. Sometime between the 2016 and 2017 at [place].   At [place] 02/05/17 reviewed with her that her last A1c was excellent. Discussed that we could wait 6 months between visits if she wants. She states that she wants to come every 3 months and keep monitor this closely. Says that "10 people are diabetic  and my family--- brothers, sisters---" She is to schedule follow-up office visit 3 months Follow-up sooner if needed.  Signed, [address] [place], [place], [facility] 02/05/2017 7:58 AM

## 2017-02-06 ENCOUNTER — Encounter: Payer: Self-pay | Admitting: *Deleted

## 2017-02-06 LAB — MICROALBUMIN, URINE: MICROALB UR: 0.5 mg/dL

## 2017-02-06 LAB — COMPLETE METABOLIC PANEL WITH GFR
AG RATIO: 1.4 (calc) (ref 1.0–2.5)
ALT: 13 U/L (ref 6–29)
AST: 18 U/L (ref 10–35)
Albumin: 4 g/dL (ref 3.6–5.1)
Alkaline phosphatase (APISO): 71 U/L (ref 33–130)
BILIRUBIN TOTAL: 0.2 mg/dL (ref 0.2–1.2)
BUN/Creatinine Ratio: 18 (calc) (ref 6–22)
BUN: 18 mg/dL (ref 7–25)
CHLORIDE: 108 mmol/L (ref 98–110)
CO2: 25 mmol/L (ref 20–32)
Calcium: 9.3 mg/dL (ref 8.6–10.4)
Creat: 0.99 mg/dL — ABNORMAL HIGH (ref 0.60–0.93)
GFR, EST AFRICAN AMERICAN: 67 mL/min/{1.73_m2} (ref >=60)
GFR, Est Non African American: 58 mL/min/{1.73_m2} — ABNORMAL LOW (ref >=60)
Globulin: 2.8 g/dL (calc) (ref 1.9–3.7)
Glucose, Bld: 146 mg/dL — ABNORMAL HIGH (ref 65–99)
POTASSIUM: 4.2 mmol/L (ref 3.5–5.3)
SODIUM: 141 mmol/L (ref 135–146)
TOTAL PROTEIN: 6.8 g/dL (ref 6.1–8.1)

## 2017-02-06 LAB — LIPID PANEL
Cholesterol: 155 mg/dL (ref <200)
HDL: 37 mg/dL — ABNORMAL LOW (ref >50)
LDL Cholesterol (Calc): 93 mg/dL (calc)
Non-HDL Cholesterol (Calc): 118 mg/dL (calc) (ref <130)
TRIGLYCERIDES: 145 mg/dL (ref <150)
Total CHOL/HDL Ratio: 4.2 (calc) (ref <5.0)

## 2017-02-06 LAB — HEMOGLOBIN A1C
EAG (MMOL/L): 7.6 (calc)
HEMOGLOBIN A1C: 6.4 %{Hb} — AB (ref <5.7)
MEAN PLASMA GLUCOSE: 137 (calc)

## 2017-02-06 LAB — HEPATITIS C ANTIBODY
Hepatitis C Ab: NONREACTIVE
SIGNAL TO CUT-OFF: 0.01 (ref <1.00)

## 2017-02-06 LAB — VITAMIN D 25 HYDROXY (VIT D DEFICIENCY, FRACTURES): VIT D 25 HYDROXY: 34 ng/mL (ref 30–100)

## 2017-03-04 ENCOUNTER — Other Ambulatory Visit: Payer: Self-pay | Admitting: Physician Assistant

## 2017-03-04 DIAGNOSIS — E119 Type 2 diabetes mellitus without complications: Secondary | ICD-10-CM

## 2017-05-09 ENCOUNTER — Other Ambulatory Visit: Payer: Self-pay

## 2017-05-09 ENCOUNTER — Ambulatory Visit: Payer: Medicare HMO | Admitting: Physician Assistant

## 2017-05-09 ENCOUNTER — Encounter: Payer: Self-pay | Admitting: Physician Assistant

## 2017-05-09 VITALS — BP 124/76 | HR 73 | Temp 97.7°F | Resp 16 | Ht 65.0 in | Wt 211.6 lb

## 2017-05-09 DIAGNOSIS — I10 Essential (primary) hypertension: Secondary | ICD-10-CM | POA: Diagnosis not present

## 2017-05-09 DIAGNOSIS — E559 Vitamin D deficiency, unspecified: Secondary | ICD-10-CM

## 2017-05-09 DIAGNOSIS — N183 Chronic kidney disease, stage 3 unspecified: Secondary | ICD-10-CM

## 2017-05-09 DIAGNOSIS — E785 Hyperlipidemia, unspecified: Secondary | ICD-10-CM

## 2017-05-09 DIAGNOSIS — K219 Gastro-esophageal reflux disease without esophagitis: Secondary | ICD-10-CM | POA: Diagnosis not present

## 2017-05-09 DIAGNOSIS — E119 Type 2 diabetes mellitus without complications: Secondary | ICD-10-CM

## 2017-05-09 DIAGNOSIS — M8580 Other specified disorders of bone density and structure, unspecified site: Secondary | ICD-10-CM

## 2017-05-09 NOTE — Progress Notes (Addendum)
Patient ID: Seanna Sisler MRN: [id number], DOB: 03-25-1946, 72 y.o.  Date of Encounter: 05/09/2017,   Chief Complaint: Routine follow-up office visit and lab work.  HPI: 72 y.o.  y/o female  here for routine follow-up office visit and lab work.  She was seen on 06/11/2014 as a "new patient"  to reestablish care here. Her last office visit here prior to that was 12/2010.  She said that since she was coming here she has gone to the [facility]. Said that they had been prescribing her 2 blood pressure medications. Said that she had not been diagnosed with any other new medical problems since her visit here.  At that visit 06/11/2014 we did complete physical exam. Did full panel a screening lab work. Updated preventative care.  Labs revealed hyperlipidemia with LDL 169. Patient then started simvastatin 40 mg. Had repeat lipid panel 07/24/14 which showed LDL 106 and LFTs normal.  Labs also revealed A1c 6.7. Planned that I would review carbohydrate handout with her follow-up visit----has been given and reviewed in detail at visit 12/10/2014. Pt seems very interested/motivated in making changes.  Labs also revealed vitamin D deficiency at 68. Recommended she start 2000 units daily.   She continues to do her exercise. She exercises 4 days a week. Goes to a  "seniors exercise class"--- says that this is mostly stretching type exercises. Says that she does go to the facility early and walks the track for 15 minutes prior to the class. No lower extremity claudication symptoms with this and no angina symptoms with this exertion.  At [place] 02/24/2015 she said that she "has cut back on whole lot".  Said that she really likes bread and has really had to cut that back and then as well and potatoes and rice too". However at that visit her weight was only down 1 pound.  12/16/2015: She is still following her low carbohydrate diet. Continues decreased intake of bread potatoes and rice as well as  other carbs. She continues to do her same exercise. Goes to "Seniors Exercise Class" 4 days a week. She has glaucoma so she goes to the eye doctor on a routine basis. She states that her last visit there she did inform them of her diabetes and they said they saw no diabetic changes. Today also discussed with her diabetic foot care and she is aware and educated of this. Today's visit she does state that she has been having acid reflux. Says that she notices it mostly at night when she lies down. Says even when she stops eating 3 hours prior to going to bed and only takes her medicine with a small sip of water prior to going to bed she still feels this reflux at that time. Sometimes during the day. Using Tums and Zantac recently.  AT [place] 12/16/2015:  Added Lisinopril 10mg  QD (ACE Inh) Discontinued HCTZ 12.5 mg. Decreased Norvasc from 10 mg to 5 mg.----- to avoid developing hypotension----she needed ACE inhibitor for renal protection. Also added aspirin 81 mg daily. Also added omeprazole 20 mg daily for GERD. Planned follow-up office visit 2 weeks to recheck blood pressure and BMET after medication changes.  12/30/2015: She did make all the medication changes as listed above.  Says that she has had no adverse effects and has been feeling just fine. Absolutely no lower extremity edema.  She is taking the aspirin daily.  She is taking the omeprazole. She says that that has made her feel so much better and  she is so appreciative for that medication.  Did go get the Zostavax as well.  04/06/2016: She is taking blood pressure medications as directed. No lightheadedness or other adverse effects. She is taking simvastatin as directed. No myalgias or other adverse effects. She is taking vitamin D supplement.--2,000 units daily. Taking Calcium supplement also. She states that she is taking the omeprazole almost every day. She tries to skip it and not have to use it all the time but usually has heartburn  symptoms that she does not send a taking it most every day. She says that she is trying to be careful with her diet and her bread and carbohydrates. However noted that weight today is up to 208 pounds. Told her to be careful especially now with the holidays. She is still going to her seniors exercise class 4 days a week.   07/20/2016: Today she reports that she has been having urinary frequency and nocturia and urgency for several months. Says that she is having to go to the bathroom multiple times at night. She occasionally leaks a little bit of urine if she doesn't get to the bathroom fast enough. Urge incontinence. No stress incontinence. Does not report any urine leakage with coughing sneezing and laughing. She is taking blood pressure medications as directed. No lightheadedness or other adverse effects. She is taking simvastatin as directed. No myalgias or other adverse effects. She is taking vitamin D supplement.--2,000 units daily. Taking Calcium supplement also. She states that she is taking the omeprazole almost every day. She tries to skip it and not have to use it all the time but usually has heartburn symptoms that she does not send a taking it most every day. She says that she is trying to be careful with her diet and her bread and carbohydrates.  She is still going to her seniors exercise class 4 days a week. At [place] 03/2016 noted that weight was up to 208 pounds. Today weight stable at 208.   11/02/2016: At last office visit added Myrbetriq for her urinary symptoms. Today she reports that she is taking this daily and that it is working very well. It is completely controlling those symptoms and is causing no adverse effects. She is taking blood pressure medications as directed. No lightheadedness or other adverse effects. She is taking simvastatin as directed. No myalgias or other adverse effects. She is taking vitamin D supplement.--2,000 units daily. Taking Calcium supplement also. She  states that she is taking the omeprazole almost every day. She tries to skip it and not have to use it all the time but usually has heartburn symptoms that she does not send a taking it most every day.  She is still going to her seniors exercise class 4 days a week.  At prior visits she said that she was "trying to be careful with her diet and her bread and carbohydrates" At her [place] 03/2016 discussed with her that her weight was up to 208. At visit 07/20/16 discussed the weight was stable at 208. At visit 11/02/16 reviewed with her that weight is up to 211.8. She states that she does continue her senior exercise classes 4 days a week.  However admits that she is not doing a very good job with her diet. She has been educated and knows what she is supposed to do and what foods she needs to limit,  but is having a hard time putting this into action. She has no concerns to address today.  02/05/2017: Today she reports that she has been feeling good. She has no specific concerns to address today. She reports that she continues to go to her seniors exercise class 4 days a week. She continues to take Myrbetriq for her urinary symptoms. This continues to work well. It is controlling her symptoms and is causing no adverse effects. She is taking blood pressure medications as directed. No lightheadedness or other adverse effects. She is taking simvastatin as directed. No myalgias or other adverse effects. She is taking vitamin D supplement.--2,000 units daily. Taking Calcium supplement also. She states that she is taking the omeprazole almost every day. She tries to skip it and not have to use it all the time but usually has heartburn symptoms that she does not send a taking it most every day.   05/09/2017: Today I reviewed with her that at her last visit we did full panel of labs and everything looked good.  On her current medications, everything is well controlled.  Reviewed that her blood pressure reading  today is good.   Asked if she thought that her diet has gotten way out of range with the holidays or if she kept things pretty well maintained.   She says that she did not splurge too much at the holidays-- partly because her 45 year old mother-in-law had a fall-- and was in the hospital some and so she was busy with that part of the time.   She has no specific concerns today.  Everything has been stable from her standpoint--  regarding symptoms. She reports that she continues to go to her seniors exercise class 4 days a week. She continues to take Myrbetriq for her urinary symptoms. This continues to work well. It is controlling her symptoms and is causing no adverse effects. She is taking blood pressure medications as directed. No lightheadedness or other adverse effects. She is taking simvastatin as directed. No myalgias or other adverse effects. She is taking vitamin D supplement.--2,000 units daily. Taking Calcium supplement also. She states that she is taking the omeprazole almost every day. She tries to skip it and not have to use it all the time but usually has heartburn symptoms that she does not send a taking it most every day.     Review of Systems: Consitutional: No fever, chills, fatigue, night sweats, lymphadenopathy. No significant/unexplained weight changes. Eyes: No visual changes, eye redness, or discharge. ENT/Mouth: No ear pain, sore throat, nasal drainage, or sinus pain. Cardiovascular: No chest pressure,heaviness, tightness or squeezing, even with exertion. No increased shortness of breath or dyspnea on exertion.No palpitations, edema, orthopnea, PND. Respiratory: No cough, hemoptysis, SOB, or wheezing. Gastrointestinal: No anorexia, dysphagia, reflux, pain, nausea, vomiting, hematemesis, diarrhea, constipation, BRBPR, or melena. Breast: No mass, nodules, bulging, or retraction. No skin changes or inflammation. No nipple discharge. No lymphadenopathy. Genitourinary: No  dysuria, hematuria, incontinence, vaginal discharge, pruritis, burning, abnormal bleeding, or pain. Musculoskeletal: No decreased ROM, No joint pain or swelling. No significant pain in neck, back, or extremities. Skin: No rash, pruritis, or concerning lesions. Neurological: No headache, dizziness, syncope, seizures, tremors, memory loss, coordination problems, or paresthesias. Psychological: No anxiety, depression, hallucinations, SI/HI. Endocrine: No polydipsia, polyphagia, polyuria, or known diabetes.No increased fatigue. No palpitations/rapid heart rate. No significant/unexplained weight change. All other systems were reviewed and are otherwise negative.  Past Medical History:  Diagnosis Date  . Glaucoma   . Hyperlipidemia   . Hypertension      Past Surgical History:  Procedure Laterality Date  .  BREAST BIOPSY Right 09/29/2015  . BREAST EXCISIONAL BIOPSY Right 11/04/2015   papilloma  . BREAST LUMPECTOMY WITH RADIOACTIVE SEED LOCALIZATION Right 11/04/2015   Procedure: BREAST LUMPECTOMY WITH RADIOACTIVE SEED LOCALIZATION;  Surgeon: Autumn Messing III, MD;  Location: [facility];  Service: General;  Laterality: Right;  . LIPOMA EXCISION      Home Meds:  Outpatient Medications Prior to Visit  Medication Sig Dispense Refill  . amLODipine (NORVASC) 5 MG tablet Take 1 tablet (5 mg total) by mouth daily. 90 tablet 3  . ASPIRIN ADULT LOW STRENGTH 81 MG EC tablet TAKE 1 TABLET BY MOUTH EVERY DAY 30 tablet 11  . Calcium 500 MG CHEW Chew 2 tablets (1,000 mg total) by mouth daily. 30 tablet 0  . Cholecalciferol (VITAMIN D) 2000 units tablet Take 1 tablet (2,000 Units total) by mouth daily. 90 tablet 2  . lisinopril (PRINIVIL,ZESTRIL) 10 MG tablet Take 1 tablet (10 mg total) by mouth daily. 90 tablet 1  . mirabegron ER (MYRBETRIQ) 50 MG TB24 tablet Take 1 tablet (50 mg total) by mouth daily. 90 tablet 2  . Multiple Vitamin (MULTIVITAMIN) tablet Take 1 tablet by mouth daily.    Marland Kitchen  omeprazole (PRILOSEC) 20 MG capsule Take 1 capsule (20 mg total) by mouth daily. 90 capsule 2  . simvastatin (ZOCOR) 40 MG tablet Take 1 tablet (40 mg total) by mouth at bedtime. 90 tablet 2  . ASPIRIN ADULT LOW STRENGTH 81 MG EC tablet TAKE 1 TABLET BY MOUTH EVERY DAY 30 tablet 11   No facility-administered medications prior to visit.     Allergies:  Allergies  Allergen Reactions  . Latex   . Lipitor [Atorvastatin] Other (See Comments)    Made mouth very dry and lips discolored    Social History   Socioeconomic History  . Marital status: Married    Spouse name: Not on file  . Number of children: Not on file  . Years of education: Not on file  . Highest education level: Not on file  Social Needs  . Financial resource strain: Not on file  . Food insecurity - worry: Not on file  . Food insecurity - inability: Not on file  . Transportation needs - medical: Not on file  . Transportation needs - non-medical: Not on file  Occupational History  . Not on file  Tobacco Use  . Smoking status: Former Smoker    Last attempt to quit: 05/01/1990    Years since quitting: 27.0  . Smokeless tobacco: Never Used  Substance and Sexual Activity  . Alcohol use: No  . Drug use: No  . Sexual activity: Not Currently  Other Topics Concern  . Not on file  Social History Narrative   Entered 06/2014:    Married x 48 years.   2 Children--2 sons   She exercises 4 days a week. Goes to a  "seniors exercise class"--- says that this is mostly stretching type exercises.  Says that she does go to the facility early and walks the track for 15 minutes prior to the class. No lower extremity claudication symptoms with this and no angina symptoms with this exertion.  Family History  Problem Relation Age of Onset  . Diabetes Sister   . Diabetes Brother   . Diabetes Sister   . Diabetes Sister   . Diabetes Sister   . Diabetes Sister     Physical Exam: Blood pressure 124/76, pulse 73, temperature 97.7 F  (36.5 C), temperature source Oral,  resp. rate 16, height 5\' 5"  (1.651 m), weight 96 kg (211 lb 9.6 oz), SpO2 98 %., Body mass index is 35.21 kg/m. General: Obese AA Female. Appears in no acute distress. Neck: Supple. Trachea midline. No thyromegaly. Full ROM. No lymphadenopathy.No Carotid Bruits. Left Neck---protrusion--soft, mobile---there is scar over this---she had lipoma removed there---says that she also had lipoma on her back removed and it re-occurred. Lungs: Clear to auscultation bilaterally without wheezes, rales, or rhonchi. Breathing is of normal effort and unlabored. Cardiovascular: RRR with S1 S2. No murmurs, rubs, or gallops.   Abdomen: Soft, non-tender, non-distended with normoactive bowel sounds. No hepatosplenomegaly or masses. No rebound/guarding. No CVA tenderness. No hernias.  Musculoskeletal: Full range of motion and 5/5 strength throughout.  Extremities without  edema. Skin: Warm and moist without erythema, ecchymosis, wounds, or rash. Diabetic Foot Exam: She does have a callus on the plantar surface of her right foot at the "ball of her foot " Remainder of inspection is normal. Sensation intact. Dorsalis pedis and posterior tibial pulses 2+ throughout. Neuro: A+Ox3. CN II-XII grossly intact. Moves all extremities spontaneously. Full sensation throughout. Normal gait.  Psych:  Responds to questions appropriately with a normal affect.   Assessment/Plan:  72 y.o. y/o female here for    Diabetes mellitus without complication  At visit 6/96/78 discussed the fact that she already has routine eye exams as she has glaucoma. Says her eye doctor is aware that she has diabetes and they told her she had no signs of diabetic retinopathy. At [place] 05/09/2017--- discussed eye exams.  She states that she goes routinely every 1-2 years. At visit 12/16/15 discussed proper foot care. She is educated about this and aware to avoid any type of wound to the foot.  02/05/2017: Told her the next time  she goes for eye exam to have them to send a copy of their note to [place].  ---------------Also told her to inform them that she does have diabetes even though it is "diet controlled ".  05/09/2017: Check A1C  Hyperlipidemia When she was seen here as a patient in the past she had hyperlipidemia.  When she was seen here 06/11/2014 she was not on any medication for this.  06/11/2014 lipid panel showed LDL 169. She then started simvastatin 40 mg. Follow-up lipid panel 07/24/14 showed LDL 106. Continued simvastatin 40 mg. Rechecked  FLP and LFT  12/10/14.----- labs were stable. She was to continue current medication and follow low carbohydrate handout 11/2015---FLP at goal 04/06/2016--- reviewed that lipid panel has been at goal. LDL was 72 and then at 96 at recheck. LFTis normal. Continue current dose of statin. 07/20/2016: She is fasting. Recheck FLP/LFT 11/02/2016: FLP LFT were good 3/18. Continue current dose statin. 02/05/17--- she is on statin--- she is fasting. Recheck FLP LFT to monitor. 05/09/2017: She is on statin.  She had FLP 02/05/17 with LDL 93.  LFTs normal.  Continue current medication.  HYPERTENSION, BENIGN SYSTEMIC: 11/02/2016:   Blood pressure is at goal and controlled today. She is on ACE Inh for renal protection, given her diabetes. BMET normal/stable at last check  02/05/17--- blood pressure is slightly high today. However it was excellent at her last visit. 10/2016 BP was 126/68. Therefore at this time will just continue current medications but will monitor. BP --------------elevated again at next visit then we'll adjust medications. 05/09/2017------ blood pressure is well controlled at goal.  Continue current medication.  Vitamin D deficiency - DG Bone Density; Future 05/09/2017: She had follow-up DEXA  scan on 12/07/16. T score -1.3. Osteopenia. Continue calcium, vitamin D, weightbearing exercise.  Osteopenia, unspecified location - DG Bone Density; Future 05/09/2017: She had follow-up DEXA scan on  12/07/16. T score -1.3. Osteopenia. Continue calcium, vitamin D, weightbearing exercise.   Postmenopausal - DG Bone Density; Future 05/09/2017: She had follow-up DEXA scan on 12/07/16. T score -1.3. Osteopenia. Continue calcium, vitamin D, weightbearing exercise.   Estrogen deficiency - DG Bone Density; Future 05/09/2017: She had follow-up DEXA scan on 12/07/16. T score -1.3. Osteopenia. Continue calcium, vitamin D, weightbearing exercise.   OAB (overactive bladder) 07/20/2016: At visit today she reports symptoms consistent with overactive bladder and urge incontinence. Will start Myrbetriq. She is to call me if this causes adverse effects or does not control her symptoms. - mirabegron ER (MYRBETRIQ) 50 MG TB24 tablet; Take 1 tablet (50 mg total) by mouth daily.  Dispense: 30 tablet; Refill: 11 05/09/2017: Symptoms are controlled with current medication. Causing no adverse effects. Continue Myrbetriq  Vitamin D deficiency - Vit D  25 hydroxy (rtn osteoporosis monitoring) 06/11/14 labs vitamin D was 22. At that time she did start vitamin D 2000 IUs daily. Verified that she is taking this at her visit 12/10/14. Recheck vitamin D level at that visit. 12/16/15 vitamin D level ---43. Continue current dose. 04/06/16--- she does continue taking vitamin D 2000 units daily. 05/09/2017: Reviewed that last vitamin D level was 11/2015 and was good at 43 and level prior to that was 38. Has been > 1 year since checked level--recheck now.   Glaucoma At [facility] 06/11/2014:She reports that she has seen an eye doctor and has been told she has glaucoma. At [place] 12/10/2014--reviewed that her last eye exam was July 2015. And I discussed that she needs to schedule annual follow-up eye exam she says that she usually goes every 2 years. Then asked about the glaucoma and she says that she had glaucoma about 10 years ago but she had treatment with laser surgery to correct that. At [place] 12/16/15 she reports that her eye doctor is aware of her  diabetes and that at her last eye exam a told her no signs of diabetic change. She does continue to follow up with her eye doctor routinely. 05/09/2017: Cont routine eye exams  GERD At office visit 12/16/2015 added omeprazole 20 mg daily. At that visit I also discussed stopping eating and drinking couple hours prior to going to sleep. He states that she already had quit doing all eating and drinking other than taking her nighttime pill with one sip of water and still has reflux when she lies down despite these changes. Also has some reflux symptoms during the day. 04/06/2016--- she states that she tries to go without the omeprazole some but ends up needing it most every day. 05/09/2017:-- she states that she tries to go without the omeprazole some but ends up needing it most every day.  THE FOLLOWING IS COPIED FROM HER CPE NOTE 06/11/2014:  Visit for preventive health examination  A. Screening Labs: - CBC with Differential/Platelet - COMPLETE METABOLIC PANEL WITH GFR - Lipid panel - Hemoglobin A1c - TSH - Vit D  25 hydroxy (rtn osteoporosis monitoring) - MM Digital Screening; Future  B. Pap: She reports that she did see gynecology in the past but that she probably last saw them in the 1990s. Is that back then her Pap smears were all normal. She defers further pelvic exams or Pap smears.  C. Screening Mammogram: 06/11/2014: "She reports that she  has had no mammogram since the 1990s. She is agreeable for me to schedule follow-up mammogram." She had mammogram 07/08/14 and further evaluation was needed so they did that 07/14/2014. Negative. ----ADDENDUM ADDED 07/20/2016----She had Mammogram, Biopsy 10/2015--------  D. DEXA/BMD:  06/11/2014: She reports that she has not had a bone density test recently and she is agreeable for me to schedule follow-up bone density test. She had bone density scan 07/08/14. We have already called her regarding those results and gave her instructions. This showed  osteopenia. We recommended her continue her weightbearing exercise and also take calcium and vitamin D. Today 12/10/14 she states that she is taking 1500 mg of calcium a day and 2000 units vitamin D daily.  E. Colorectal Cancer Screening: She reports that she had a colonoscopy around 2007 that was normal and was performed by Dr. Collene Mares ---------------------AT 02/24/15 OV----I HAD HER SIGN RELEASE TO GET RECORD FROM DR [facility]----------------------- ----------- 05/09/2017--------- I reviewed that I never got record from Dr. Collene Mares.  At today's visit told patient to call Dr. Lorie Apley office and find out date of last colonoscopy and when next colonoscopy is due.  Also I wrote this on her AVS as a reminder.  She voices understanding and agrees to follow-up with this. ADDENDUM ADDED 05/14/2017: RECEIVED COLONOSCOPY REPORT FROM DR MANN--PROCEDURE PERFORMED 05/18/2006-----REPEAT COLONOSCOPY 5 YEARS AS PT HAS FAMILY HISTORY IN FIRST DEGREE RELATIVE --WILL HAVE STAFF INFORM PT AND FOR PT TO SCHEDULE F/U WITH DR Collene Mares   F. Immunizations:  Influenza: ----Influenza vaccine given here 06/11/14 Tetanus: ----T dap given here 06/11/14 Pneumococcal:---- Prevnar 13 given here 06/11/14   ------Pneumovax 23--Given here 12/10/2014 Zostavax:  She did go get the Zostavax at the pharmacy. Sometime between the 2016 and 2017 at [facility].   At [place] 02/05/17 reviewed with her that her last A1c was excellent. Discussed that we could wait 6 months between visits if she wants. She states that she wants to come every 3 months and keep monitor this closely. Says that "10 people are diabetic and my family--- brothers, sisters---" She is to schedule follow-up office visit 3 months Follow-up sooner if needed.  Signed, [address] [place], [place], [facility] 05/09/2017 9:15 AM

## 2017-05-10 ENCOUNTER — Other Ambulatory Visit: Payer: Self-pay

## 2017-05-10 LAB — HEMOGLOBIN A1C
EAG (MMOL/L): 7.7 (calc)
Hgb A1c MFr Bld: 6.5 % of total Hgb — ABNORMAL HIGH (ref <5.7)
Mean Plasma Glucose: 140 (calc)

## 2017-05-10 MED ORDER — METFORMIN HCL 500 MG PO TABS: 500.0000 mg | ORAL_TABLET | Freq: Every day | ORAL | 5 refills | Status: DC

## 2017-05-29 ENCOUNTER — Telehealth: Payer: Self-pay

## 2017-05-29 NOTE — Telephone Encounter (Signed)
Call placed to patient to inform her she was overdue for a colonoscopy and needed to contact Dr. Lorie Apley office to schedule an appointment for a colonoscopy. Patient states she has an appointment scheduled for August 2019 patient did not have the actual date available.

## 2017-08-08 ENCOUNTER — Ambulatory Visit: Payer: Medicare HMO | Admitting: Physician Assistant

## 2017-08-20 ENCOUNTER — Other Ambulatory Visit: Payer: Self-pay

## 2017-08-20 ENCOUNTER — Ambulatory Visit (INDEPENDENT_AMBULATORY_CARE_PROVIDER_SITE_OTHER): Payer: Medicare HMO | Admitting: Physician Assistant

## 2017-08-20 ENCOUNTER — Encounter: Payer: Self-pay | Admitting: Physician Assistant

## 2017-08-20 VITALS — BP 126/74 | HR 76 | Temp 97.6°F | Resp 16 | Ht 65.0 in | Wt 209.2 lb

## 2017-08-20 DIAGNOSIS — E559 Vitamin D deficiency, unspecified: Secondary | ICD-10-CM

## 2017-08-20 DIAGNOSIS — N183 Chronic kidney disease, stage 3 unspecified: Secondary | ICD-10-CM

## 2017-08-20 DIAGNOSIS — Z780 Asymptomatic menopausal state: Secondary | ICD-10-CM

## 2017-08-20 DIAGNOSIS — R739 Hyperglycemia, unspecified: Secondary | ICD-10-CM

## 2017-08-20 DIAGNOSIS — I10 Essential (primary) hypertension: Secondary | ICD-10-CM

## 2017-08-20 DIAGNOSIS — E119 Type 2 diabetes mellitus without complications: Secondary | ICD-10-CM | POA: Diagnosis not present

## 2017-08-20 DIAGNOSIS — M8580 Other specified disorders of bone density and structure, unspecified site: Secondary | ICD-10-CM

## 2017-08-20 DIAGNOSIS — K219 Gastro-esophageal reflux disease without esophagitis: Secondary | ICD-10-CM

## 2017-08-20 DIAGNOSIS — E2839 Other primary ovarian failure: Secondary | ICD-10-CM

## 2017-08-20 DIAGNOSIS — E785 Hyperlipidemia, unspecified: Secondary | ICD-10-CM

## 2017-08-20 NOTE — Progress Notes (Signed)
Patient ID: Heather Gross MRN: [id number], DOB: 07/04/1945, 72 y.o. Date of Encounter: 08/20/2017,   Chief Complaint: Routine follow-up office visit and lab work.  HPI: 72 y.o. y/o female  here for routine follow-up office visit and lab work.  She was seen on 06/11/2014 as a "new patient"  to reestablish care here. Her last office visit here prior to that was 12/2010.  She said that since she was coming here she has gone to the [facility]. Said that they had been prescribing her 2 blood pressure medications. Said that she had not been diagnosed with any other new medical problems since her visit here.  At that visit 06/11/2014 we did complete physical exam. Did full panel a screening lab work. Updated preventative care.  Labs revealed hyperlipidemia with LDL 169. Patient then started simvastatin 40 mg. Had repeat lipid panel 07/24/14 which showed LDL 106 and LFTs normal.  Labs also revealed A1c 6.7. Planned that I would review carbohydrate handout with her follow-up visit----has been given and reviewed in detail at visit 12/10/2014. Pt seems very interested/motivated in making changes.  Labs also revealed vitamin D deficiency at 19. Recommended she start 2000 units daily.   She continues to do her exercise. She exercises 4 days a week. Goes to a  "seniors exercise class"--- says that this is mostly stretching type exercises. Says that she does go to the facility early and walks the track for 15 minutes prior to the class. No lower extremity claudication symptoms with this and no angina symptoms with this exertion.  At [place] 02/24/2015 she said that she "has cut back on whole lot".  Said that she really likes bread and has really had to cut that back and then as well and potatoes and rice too". However at that visit her weight was only down 1 pound.  12/16/2015: She is still following her low carbohydrate diet. Continues decreased intake of bread potatoes and rice as well as  other carbs. She continues to do her same exercise. Goes to "Seniors Exercise Class" 4 days a week. She has glaucoma so she goes to the eye doctor on a routine basis. She states that her last visit there she did inform them of her diabetes and they said they saw no diabetic changes. Today also discussed with her diabetic foot care and she is aware and educated of this. Today's visit she does state that she has been having acid reflux. Says that she notices it mostly at night when she lies down. Says even when she stops eating 3 hours prior to going to bed and only takes her medicine with a small sip of water prior to going to bed she still feels this reflux at that time. Sometimes during the day. Using Tums and Zantac recently.  AT [place] 12/16/2015:  Added Lisinopril 10mg  QD (ACE Inh) Discontinued HCTZ 12.5 mg. Decreased Norvasc from 10 mg to 5 mg.----- to avoid developing hypotension----she needed ACE inhibitor for renal protection. Also added aspirin 81 mg daily. Also added omeprazole 20 mg daily for GERD. Planned follow-up office visit 2 weeks to recheck blood pressure and BMET after medication changes.  12/30/2015: She did make all the medication changes as listed above.  Says that she has had no adverse effects and has been feeling just fine. Absolutely no lower extremity edema.  She is taking the aspirin daily.  She is taking the omeprazole. She says that that has made her feel so much better and  she is so appreciative for that medication.  Did go get the Zostavax as well.  04/06/2016: She is taking blood pressure medications as directed. No lightheadedness or other adverse effects. She is taking simvastatin as directed. No myalgias or other adverse effects. She is taking vitamin D supplement.--2,000 units daily. Taking Calcium supplement also. She states that she is taking the omeprazole almost every day. She tries to skip it and not have to use it all the time but usually has heartburn  symptoms that she does not send a taking it most every day. She says that she is trying to be careful with her diet and her bread and carbohydrates. However noted that weight today is up to 208 pounds. Told her to be careful especially now with the holidays. She is still going to her seniors exercise class 4 days a week.   07/20/2016: Today she reports that she has been having urinary frequency and nocturia and urgency for several months. Says that she is having to go to the bathroom multiple times at night. She occasionally leaks a little bit of urine if she doesn't get to the bathroom fast enough. Urge incontinence. No stress incontinence. Does not report any urine leakage with coughing sneezing and laughing. She is taking blood pressure medications as directed. No lightheadedness or other adverse effects. She is taking simvastatin as directed. No myalgias or other adverse effects. She is taking vitamin D supplement.--2,000 units daily. Taking Calcium supplement also. She states that she is taking the omeprazole almost every day. She tries to skip it and not have to use it all the time but usually has heartburn symptoms that she does not send a taking it most every day. She says that she is trying to be careful with her diet and her bread and carbohydrates.  She is still going to her seniors exercise class 4 days a week. At [place] 03/2016 noted that weight was up to 208 pounds. Today weight stable at 208.   11/02/2016: At last office visit added Myrbetriq for her urinary symptoms. Today she reports that she is taking this daily and that it is working very well. It is completely controlling those symptoms and is causing no adverse effects. She is taking blood pressure medications as directed. No lightheadedness or other adverse effects. She is taking simvastatin as directed. No myalgias or other adverse effects. She is taking vitamin D supplement.--2,000 units daily. Taking Calcium supplement also. She  states that she is taking the omeprazole almost every day. She tries to skip it and not have to use it all the time but usually has heartburn symptoms that she does not send a taking it most every day.  She is still going to her seniors exercise class 4 days a week.  At prior visits she said that she was "trying to be careful with her diet and her bread and carbohydrates" At her OV 03/2016 discussed with her that her weight was up to 208. At visit 07/20/16 discussed the weight was stable at 208. At visit 11/02/16 reviewed with her that weight is up to 211.8. She states that she does continue her senior exercise classes 4 days a week.  However admits that she is not doing a very good job with her diet. She has been educated and knows what she is supposed to do and what foods she needs to limit,  but is having a hard time putting this into action. She has no concerns to address today.  02/05/2017: Today she reports that she has been feeling good. She has no specific concerns to address today. She reports that she continues to go to her seniors exercise class 4 days a week. She continues to take Myrbetriq for her urinary symptoms. This continues to work well. It is controlling her symptoms and is causing no adverse effects. She is taking blood pressure medications as directed. No lightheadedness or other adverse effects. She is taking simvastatin as directed. No myalgias or other adverse effects. She is taking vitamin D supplement.--2,000 units daily. Taking Calcium supplement also. She states that she is taking the omeprazole almost every day. She tries to skip it and not have to use it all the time but usually has heartburn symptoms that she does not send a taking it most every day.   05/09/2017: Today I reviewed with her that at her last visit we did full panel of labs and everything looked good.  On her current medications, everything is well controlled.  Reviewed that her blood pressure reading  today is good.   Asked if she thought that her diet has gotten way out of range with the holidays or if she kept things pretty well maintained.   She says that she did not splurge too much at the holidays-- partly because her 71 year old mother-in-law had a fall-- and was in the hospital some and so she was busy with that part of the time.   She has no specific concerns today.  Everything has been stable from her standpoint--  regarding symptoms. She reports that she continues to go to her seniors exercise class 4 days a week. She continues to take Myrbetriq for her urinary symptoms. This continues to work well. It is controlling her symptoms and is causing no adverse effects. She is taking blood pressure medications as directed. No lightheadedness or other adverse effects. She is taking simvastatin as directed. No myalgias or other adverse effects. She is taking vitamin D supplement.--2,000 units daily. Taking Calcium supplement also. She states that she is taking the omeprazole almost every day. She tries to skip it and not have to use it all the time but usually has heartburn symptoms that she does not send a taking it most every day.    08/20/2017: Today I reviewed results from last labs. 05/09/2017--A1C was 6.5. Added Metformin 500mg  1 po QD. today she reports that she did add this and has been taking it daily.  It is causing no adverse effects. She reports that she has been feeling good.  Has no specific concerns to address. Reviewed that her blood pressure is excellent at 126/74 today. She continues to do seniors exercise class IV days a week. She continues to take Myrbetriq for her urinary symptoms. This continues to work well. It is controlling her symptoms and is causing no adverse effects. She is taking blood pressure medications as directed. No lightheadedness or other adverse effects. She is taking simvastatin as directed. No myalgias or other adverse effects. She is taking vitamin D  supplement.--2,000 units daily. Taking Calcium supplement also. She states that she is taking the omeprazole almost every day. She tries to skip it and not have to use it all the time but usually has heartburn symptoms that she does not send a taking it most every day.    Review of Systems: Consitutional: No fever, chills, fatigue, night sweats, lymphadenopathy. No significant/unexplained weight changes. Eyes: No visual changes, eye redness, or discharge. ENT/Mouth: No ear pain, sore throat, nasal  drainage, or sinus pain. Cardiovascular: No chest pressure,heaviness, tightness or squeezing, even with exertion. No increased shortness of breath or dyspnea on exertion.No palpitations, edema, orthopnea, PND. Respiratory: No cough, hemoptysis, SOB, or wheezing. Gastrointestinal: No anorexia, dysphagia, reflux, pain, nausea, vomiting, hematemesis, diarrhea, constipation, BRBPR, or melena. Breast: No mass, nodules, bulging, or retraction. No skin changes or inflammation. No nipple discharge. No lymphadenopathy. Genitourinary: No dysuria, hematuria, incontinence, vaginal discharge, pruritis, burning, abnormal bleeding, or pain. Musculoskeletal: No decreased ROM, No joint pain or swelling. No significant pain in neck, back, or extremities. Skin: No rash, pruritis, or concerning lesions. Neurological: No headache, dizziness, syncope, seizures, tremors, memory loss, coordination problems, or paresthesias. Psychological: No anxiety, depression, hallucinations, SI/HI. Endocrine: No polydipsia, polyphagia, polyuria, or known diabetes.No increased fatigue. No palpitations/rapid heart rate. No significant/unexplained weight change. All other systems were reviewed and are otherwise negative.  Past Medical History:  Diagnosis Date  . Glaucoma   . Hyperlipidemia   . Hypertension      Past Surgical History:  Procedure Laterality Date  . BREAST BIOPSY Right 09/29/2015  . BREAST EXCISIONAL BIOPSY Right  11/04/2015   papilloma  . BREAST LUMPECTOMY WITH RADIOACTIVE SEED LOCALIZATION Right 11/04/2015   Procedure: BREAST LUMPECTOMY WITH RADIOACTIVE SEED LOCALIZATION;  Surgeon: Autumn Messing III, MD;  Location: [place];  Service: General;  Laterality: Right;  . LIPOMA EXCISION      Home Meds:  Outpatient Medications Prior to Visit  Medication Sig Dispense Refill  . amLODipine (NORVASC) 5 MG tablet Take 1 tablet (5 mg total) by mouth daily. 90 tablet 3  . ASPIRIN ADULT LOW STRENGTH 81 MG EC tablet TAKE 1 TABLET BY MOUTH EVERY DAY 30 tablet 11  . Calcium 500 MG CHEW Chew 2 tablets (1,000 mg total) by mouth daily. 30 tablet 0  . Cholecalciferol (VITAMIN D) 2000 units tablet Take 1 tablet (2,000 Units total) by mouth daily. 90 tablet 2  . lisinopril (PRINIVIL,ZESTRIL) 10 MG tablet Take 1 tablet (10 mg total) by mouth daily. 90 tablet 1  . metFORMIN (GLUCOPHAGE) 500 MG tablet Take 1 tablet (500 mg total) by mouth daily. 30 tablet 5  . Multiple Vitamin (MULTIVITAMIN) tablet Take 1 tablet by mouth daily.    Marland Kitchen omeprazole (PRILOSEC) 20 MG capsule Take 1 capsule (20 mg total) by mouth daily. 90 capsule 2  . simvastatin (ZOCOR) 40 MG tablet Take 1 tablet (40 mg total) by mouth at bedtime. 90 tablet 2  . mirabegron ER (MYRBETRIQ) 50 MG TB24 tablet Take 1 tablet (50 mg total) by mouth daily. 90 tablet 2   No facility-administered medications prior to visit.     Allergies:  Allergies  Allergen Reactions  . Latex   . Lipitor [Atorvastatin] Other (See Comments)    Made mouth very dry and lips discolored    Social History   Socioeconomic History  . Marital status: Married    Spouse name: Not on file  . Number of children: Not on file  . Years of education: Not on file  . Highest education level: Not on file  Occupational History  . Not on file  Social Needs  . Financial resource strain: Not on file  . Food insecurity:    Worry: Not on file    Inability: Not on file  .  Transportation needs:    Medical: Not on file    Non-medical: Not on file  Tobacco Use  . Smoking status: Former Smoker    Last attempt to quit:  05/01/1990    Years since quitting: 27.3  . Smokeless tobacco: Never Used  Substance and Sexual Activity  . Alcohol use: No  . Drug use: No  . Sexual activity: Not Currently  Lifestyle  . Physical activity:    Days per week: Not on file    Minutes per session: Not on file  . Stress: Not on file  Relationships  . Social connections:    Talks on phone: Not on file    Gets together: Not on file    Attends religious service: Not on file    Active member of club or organization: Not on file    Attends meetings of clubs or organizations: Not on file    Relationship status: Not on file  . Intimate partner violence:    Fear of current or ex partner: Not on file    Emotionally abused: Not on file    Physically abused: Not on file    Forced sexual activity: Not on file  Other Topics Concern  . Not on file  Social History Narrative   Entered 06/2014:    Married x 48 years.   2 Children--2 sons   She exercises 4 days a week. Goes to a  "seniors exercise class"--- says that this is mostly stretching type exercises.  Says that she does go to the facility early and walks the track for 15 minutes prior to the class. No lower extremity claudication symptoms with this and no angina symptoms with this exertion.  Family History  Problem Relation Age of Onset  . Diabetes Sister   . Diabetes Brother   . Diabetes Sister   . Diabetes Sister   . Diabetes Sister   . Diabetes Sister     Physical Exam: Blood pressure 126/74, pulse 76, temperature 97.6 F (36.4 C), temperature source Oral, resp. rate 16, height 5\' 5"  (1.651 m), weight 94.9 kg (209 lb 3.2 oz), SpO2 97 %., Body mass index is 34.81 kg/m. General: Obese AA Female. Appears in no acute distress. Neck: Supple. Trachea midline. No thyromegaly. Full ROM. No lymphadenopathy.No Carotid Bruits.  Left Neck---protrusion--soft, mobile---there is scar over this---she had lipoma removed there---says that she also had lipoma on her back removed and it re-occurred. Lungs: Clear to auscultation bilaterally without wheezes, rales, or rhonchi. Breathing is of normal effort and unlabored. Cardiovascular: RRR with S1 S2. No murmurs, rubs, or gallops.   Abdomen: Soft, non-tender, non-distended with normoactive bowel sounds. No hepatosplenomegaly or masses. No rebound/guarding. No CVA tenderness. No hernias.  Musculoskeletal: Full range of motion and 5/5 strength throughout.  Extremities without  edema. Skin: Warm and moist without erythema, ecchymosis, wounds, or rash. Diabetic Foot Exam: She does have a callus on the plantar surface of her right foot at the "ball of her foot " Remainder of inspection is normal. Sensation intact. Dorsalis pedis and posterior tibial pulses 2+ throughout. Neuro: A+Ox3. CN II-XII grossly intact. Moves all extremities spontaneously. Full sensation throughout. Normal gait.  Psych:  Responds to questions appropriately with a normal affect.   Assessment/Plan:  72 y.o. y/o female here for    Diabetes mellitus without complication  At visit 2/59/56 discussed the fact that she already has routine eye exams as she has glaucoma. Says her eye doctor is aware that she has diabetes and they told her she had no signs of diabetic retinopathy. At [place] 05/09/2017--- discussed eye exams.  She states that she goes routinely every 1-2 years. At visit 12/16/15 discussed proper foot care.  She is educated about this and aware to avoid any type of wound to the foot.  02/05/2017: Told her the next time she goes for eye exam to have them to send a copy of their note to [place].  ---------------Also told her to inform them that she does have diabetes even though it is "diet controlled ".  05/09/2017: Check A1C -----------Added Metformin 500mg  QD when got that result.  08/20/2017: She did add the  metformin 500 mg daily in January 2019.  She is taking this daily with no adverse effects.  Recheck A1c.  Hyperlipidemia When she was seen here as a patient in the past she had hyperlipidemia.  When she was seen here 06/11/2014 she was not on any medication for this.  06/11/2014 lipid panel showed LDL 169. She then started simvastatin 40 mg. Follow-up lipid panel 07/24/14 showed LDL 106. Continued simvastatin 40 mg. Rechecked  FLP and LFT  12/10/14.----- labs were stable. She was to continue current medication and follow low carbohydrate handout 11/2015---FLP at goal 04/06/2016--- reviewed that lipid panel has been at goal. LDL was 72 and then at 96 at recheck. LFTis normal. Continue current dose of statin. 07/20/2016: She is fasting. Recheck FLP/LFT 11/02/2016: FLP LFT were good 3/18. Continue current dose statin. 02/05/17--- she is on statin--- she is fasting. Recheck FLP LFT to monitor. 05/09/2017: She is on statin.  She had FLP 02/05/17 with LDL 93.  LFTs normal.  Continue current medication. 08/20/2017: She is on statin. She is fasting. Recheck FLP/LFT  HYPERTENSION, BENIGN SYSTEMIC: 11/02/2016:   Blood pressure is at goal and controlled today. She is on ACE Inh for renal protection, given her diabetes. BMET normal/stable at last check  02/05/17--- blood pressure is slightly high today. However it was excellent at her last visit. 10/2016 BP was 126/68. Therefore at this time will just continue current medications but will monitor. BP --------------elevated again at next visit then we'll adjust medications. 08/20/2017------ blood pressure is well controlled at goal.  Continue current medication.  Vitamin D deficiency - DG Bone Density; Future 08/20/2017: She had follow-up DEXA scan on 12/07/16. T score -1.3. Osteopenia. Continue calcium, vitamin D, weightbearing exercise.  Osteopenia, unspecified location - DG Bone Density; Future 08/20/2017: She had follow-up DEXA scan on 12/07/16. T score -1.3. Osteopenia.  Continue calcium, vitamin D, weightbearing exercise.   Postmenopausal - DG Bone Density; Future 08/20/2017: She had follow-up DEXA scan on 12/07/16. T score -1.3. Osteopenia. Continue calcium, vitamin D, weightbearing exercise.   Estrogen deficiency - DG Bone Density; Future 08/20/2017: She had follow-up DEXA scan on 12/07/16. T score -1.3. Osteopenia. Continue calcium, vitamin D, weightbearing exercise.   OAB (overactive bladder) 07/20/2016: At visit today she reports symptoms consistent with overactive bladder and urge incontinence. Will start Myrbetriq. She is to call me if this causes adverse effects or does not control her symptoms. - mirabegron ER (MYRBETRIQ) 50 MG TB24 tablet; Take 1 tablet (50 mg total) by mouth daily.  Dispense: 30 tablet; Refill: 11 08/20/2017: Symptoms are controlled with current medication. Causing no adverse effects. Continue Myrbetriq  Vitamin D deficiency - Vit D  25 hydroxy (rtn osteoporosis monitoring) 06/11/14 labs vitamin D was 22. At that time she did start vitamin D 2000 IUs daily. Verified that she is taking this at her visit 12/10/14. Recheck vitamin D level at that visit. 12/16/15 vitamin D level ---43. Continue current dose. 04/06/16--- she does continue taking vitamin D 2000 units daily. 08/20/2017: Reviewed that last vitamin D level  was 11/2015 and was good at 43 and level prior to that was 38. Has been > 1 year since checked level--recheck now.   Glaucoma At OV 06/11/2014:She reports that she has seen an eye doctor and has been told she has glaucoma. At [place] 12/10/2014--reviewed that her last eye exam was July 2015. And I discussed that she needs to schedule annual follow-up eye exam she says that she usually goes every 2 years. Then asked about the glaucoma and she says that she had glaucoma about 10 years ago but she had treatment with laser surgery to correct that. At [place] 12/16/15 she reports that her eye doctor is aware of her diabetes and that at her last eye  exam a told her no signs of diabetic change. She does continue to follow up with her eye doctor routinely. 08/20/2017: Cont routine eye exams  GERD At office visit 12/16/2015 added omeprazole 20 mg daily. At that visit I also discussed stopping eating and drinking couple hours prior to going to sleep. He states that she already had quit doing all eating and drinking other than taking her nighttime pill with one sip of water and still has reflux when she lies down despite these changes. Also has some reflux symptoms during the day. 04/06/2016--- she states that she tries to go without the omeprazole some but ends up needing it most every day. 08/20/2017:-- she states that she tries to go without the omeprazole some but ends up needing it most every day.  THE FOLLOWING IS COPIED FROM HER CPE NOTE 06/11/2014:  Visit for preventive health examination  A. Screening Labs: - CBC with Differential/Platelet - COMPLETE METABOLIC PANEL WITH GFR - Lipid panel - Hemoglobin A1c - TSH - Vit D  25 hydroxy (rtn osteoporosis monitoring) - MM Digital Screening; Future  B. Pap: She reports that she did see gynecology in the past but that she probably last saw them in the 1990s. Is that back then her Pap smears were all normal. She defers further pelvic exams or Pap smears.  C. Screening Mammogram: 06/11/2014: "She reports that she has had no mammogram since the 1990s. She is agreeable for me to schedule follow-up mammogram." She had mammogram 07/08/14 and further evaluation was needed so they did that 07/14/2014. Negative. ----ADDENDUM ADDED 07/20/2016----She had Mammogram, Biopsy 10/2015--------  D. DEXA/BMD:  06/11/2014: She reports that she has not had a bone density test recently and she is agreeable for me to schedule follow-up bone density test. She had bone density scan 07/08/14. We have already called her regarding those results and gave her instructions. This showed osteopenia. We recommended her continue  her weightbearing exercise and also take calcium and vitamin D. Today 12/10/14 she states that she is taking 1500 mg of calcium a day and 2000 units vitamin D daily.  E. Colorectal Cancer Screening: She reports that she had a colonoscopy around 2007 that was normal and was performed by Dr. Collene Mares ---------------------AT 02/24/15 OV----I HAD HER SIGN RELEASE TO GET RECORD FROM DR [facility]----------------------- ----------- 05/09/2017--------- I reviewed that I never got record from Dr. Collene Mares.  At today's visit told patient to call Dr. Lorie Apley office and find out date of last colonoscopy and when next colonoscopy is due.  Also I wrote this on her AVS as a reminder.  She voices understanding and agrees to follow-up with this. ADDENDUM ADDED 05/14/2017: RECEIVED COLONOSCOPY REPORT FROM DR MANN--PROCEDURE PERFORMED 05/18/2006-----REPEAT COLONOSCOPY 5 YEARS AS PT HAS FAMILY HISTORY IN FIRST DEGREE RELATIVE --WILL  HAVE STAFF INFORM PT AND FOR PT TO SCHEDULE F/U WITH DR Collene Mares   F. Immunizations:  Influenza: ----Influenza vaccine given here 06/11/14 Tetanus: ----T dap given here 06/11/14 Pneumococcal:---- Prevnar 13 given here 06/11/14   ------Pneumovax 23--Given here 12/10/2014 Zostavax:  She did go get the Zostavax at the pharmacy. Sometime between the 2016 and 2017 at [place].   At [place] [facility] 02/05/17 reviewed with her that her last A1c was excellent. Discussed that we could wait 6 months between visits if she wants. She states that she wants to come every 3 months and keep monitor this closely. Says that "10 people are diabetic and my family--- brothers, sisters---" She is to schedule follow-up office visit 3 months Follow-up sooner if needed.  Marin Olp [place], [place], [facility] 08/20/2017 8:27 AM

## 2017-08-21 LAB — LIPID PANEL
CHOL/HDL RATIO: 3.9 (calc) (ref <5.0)
Cholesterol: 155 mg/dL (ref <200)
HDL: 40 mg/dL — ABNORMAL LOW (ref >50)
LDL Cholesterol (Calc): 92 mg/dL (calc)
NON-HDL CHOLESTEROL (CALC): 115 mg/dL (ref <130)
Triglycerides: 136 mg/dL (ref <150)

## 2017-08-21 LAB — COMPLETE METABOLIC PANEL WITH GFR
AG Ratio: 1.6 (calc) (ref 1.0–2.5)
ALT: 11 U/L (ref 6–29)
AST: 15 U/L (ref 10–35)
Albumin: 4.1 g/dL (ref 3.6–5.1)
Alkaline phosphatase (APISO): 63 U/L (ref 33–130)
BUN / CREAT RATIO: 15 (calc) (ref 6–22)
BUN: 16 mg/dL (ref 7–25)
CO2: 27 mmol/L (ref 20–32)
CREATININE: 1.1 mg/dL — AB (ref 0.60–0.93)
Calcium: 9.3 mg/dL (ref 8.6–10.4)
Chloride: 107 mmol/L (ref 98–110)
GFR, EST NON AFRICAN AMERICAN: 50 mL/min/{1.73_m2} — AB (ref >=60)
GFR, Est African American: 58 mL/min/{1.73_m2} — ABNORMAL LOW (ref >=60)
GLOBULIN: 2.6 g/dL (ref 1.9–3.7)
Glucose, Bld: 124 mg/dL — ABNORMAL HIGH (ref 65–99)
Potassium: 4.5 mmol/L (ref 3.5–5.3)
SODIUM: 140 mmol/L (ref 135–146)
Total Bilirubin: 0.3 mg/dL (ref 0.2–1.2)
Total Protein: 6.7 g/dL (ref 6.1–8.1)

## 2017-08-21 LAB — HEMOGLOBIN A1C
EAG (MMOL/L): 7.7 (calc)
HEMOGLOBIN A1C: 6.5 %{Hb} — AB (ref <5.7)
MEAN PLASMA GLUCOSE: 140 (calc)

## 2017-11-12 ENCOUNTER — Other Ambulatory Visit: Payer: Self-pay | Admitting: Physician Assistant

## 2017-11-12 DIAGNOSIS — Z1231 Encounter for screening mammogram for malignant neoplasm of breast: Secondary | ICD-10-CM

## 2017-11-16 DIAGNOSIS — H524 Presbyopia: Secondary | ICD-10-CM | POA: Diagnosis not present

## 2017-11-26 ENCOUNTER — Ambulatory Visit (INDEPENDENT_AMBULATORY_CARE_PROVIDER_SITE_OTHER): Payer: Medicare HMO | Admitting: Physician Assistant

## 2017-11-26 ENCOUNTER — Encounter: Payer: Self-pay | Admitting: Physician Assistant

## 2017-11-26 ENCOUNTER — Other Ambulatory Visit: Payer: Self-pay

## 2017-11-26 VITALS — BP 132/70 | HR 65 | Temp 97.7°F | Resp 16 | Ht 65.0 in | Wt 211.8 lb

## 2017-11-26 DIAGNOSIS — E119 Type 2 diabetes mellitus without complications: Secondary | ICD-10-CM

## 2017-11-26 DIAGNOSIS — E785 Hyperlipidemia, unspecified: Secondary | ICD-10-CM | POA: Diagnosis not present

## 2017-11-26 DIAGNOSIS — M8580 Other specified disorders of bone density and structure, unspecified site: Secondary | ICD-10-CM

## 2017-11-26 DIAGNOSIS — I10 Essential (primary) hypertension: Secondary | ICD-10-CM | POA: Diagnosis not present

## 2017-11-26 DIAGNOSIS — E559 Vitamin D deficiency, unspecified: Secondary | ICD-10-CM | POA: Diagnosis not present

## 2017-11-26 DIAGNOSIS — K219 Gastro-esophageal reflux disease without esophagitis: Secondary | ICD-10-CM | POA: Diagnosis not present

## 2017-11-26 MED ORDER — SULFAMETHOXAZOLE-TRIMETHOPRIM 800-160 MG PO TABS: 1.0000 | ORAL_TABLET | Freq: Two times a day (BID) | ORAL | 0 refills | Status: DC

## 2017-11-26 NOTE — Progress Notes (Addendum)
Patient ID: Quaneisha Hanisch MRN: [id number], DOB: 1945/05/13, 72 y.o. Date of Encounter: 11/26/2017,   Chief Complaint: Routine follow-up office visit and lab work.  HPI: 72 y.o. y/o female  here for routine follow-up office visit and lab work.  She was seen on 06/11/2014 as a "new patient"  to reestablish care here. Her last office visit here prior to that was 12/2010.  She said that since she was coming here she has gone to the [facility]. Said that they had been prescribing her 2 blood pressure medications. Said that she had not been diagnosed with any other new medical problems since her visit here.  At that visit 06/11/2014 we did complete physical exam. Did full panel a screening lab work. Updated preventative care.  Labs revealed hyperlipidemia with LDL 169. Patient then started simvastatin 40 mg. Had repeat lipid panel 07/24/14 which showed LDL 106 and LFTs normal.  Labs also revealed A1c 6.7. Planned that I would review carbohydrate handout with her follow-up visit----has been given and reviewed in detail at visit 12/10/2014. Pt seems very interested/motivated in making changes.  Labs also revealed vitamin D deficiency at 54. Recommended she start 2000 units daily.   She continues to do her exercise. She exercises 4 days a week. Goes to a  "seniors exercise class"--- says that this is mostly stretching type exercises. Says that she does go to the facility early and walks the track for 15 minutes prior to the class. No lower extremity claudication symptoms with this and no angina symptoms with this exertion.  At [place] 02/24/2015 she said that she "has cut back on whole lot".  Said that she really likes bread and has really had to cut that back and then as well and potatoes and rice too". However at that visit her weight was only down 1 pound.  12/16/2015: She is still following her low carbohydrate diet. Continues decreased intake of bread potatoes and rice as well as  other carbs. She continues to do her same exercise. Goes to "Seniors Exercise Class" 4 days a week. She has glaucoma so she goes to the eye doctor on a routine basis. She states that her last visit there she did inform them of her diabetes and they said they saw no diabetic changes. Today also discussed with her diabetic foot care and she is aware and educated of this. Today's visit she does state that she has been having acid reflux. Says that she notices it mostly at night when she lies down. Says even when she stops eating 3 hours prior to going to bed and only takes her medicine with a small sip of water prior to going to bed she still feels this reflux at that time. Sometimes during the day. Using Tums and Zantac recently.  AT [place] 12/16/2015:  Added Lisinopril 10mg  QD (ACE Inh) Discontinued HCTZ 12.5 mg. Decreased Norvasc from 10 mg to 5 mg.----- to avoid developing hypotension----she needed ACE inhibitor for renal protection. Also added aspirin 81 mg daily. Also added omeprazole 20 mg daily for GERD. Planned follow-up office visit 2 weeks to recheck blood pressure and BMET after medication changes.  12/30/2015: She did make all the medication changes as listed above.  Says that she has had no adverse effects and has been feeling just fine. Absolutely no lower extremity edema.  She is taking the aspirin daily.  She is taking the omeprazole. She says that that has made her feel so much better and  she is so appreciative for that medication.  Did go get the Zostavax as well.  04/06/2016: She is taking blood pressure medications as directed. No lightheadedness or other adverse effects. She is taking simvastatin as directed. No myalgias or other adverse effects. She is taking vitamin D supplement.--2,000 units daily. Taking Calcium supplement also. She states that she is taking the omeprazole almost every day. She tries to skip it and not have to use it all the time but usually has heartburn  symptoms that she does not send a taking it most every day. She says that she is trying to be careful with her diet and her bread and carbohydrates. However noted that weight today is up to 208 pounds. Told her to be careful especially now with the holidays. She is still going to her seniors exercise class 4 days a week.   07/20/2016: Today she reports that she has been having urinary frequency and nocturia and urgency for several months. Says that she is having to go to the bathroom multiple times at night. She occasionally leaks a little bit of urine if she doesn't get to the bathroom fast enough. Urge incontinence. No stress incontinence. Does not report any urine leakage with coughing sneezing and laughing. She is taking blood pressure medications as directed. No lightheadedness or other adverse effects. She is taking simvastatin as directed. No myalgias or other adverse effects. She is taking vitamin D supplement.--2,000 units daily. Taking Calcium supplement also. She states that she is taking the omeprazole almost every day. She tries to skip it and not have to use it all the time but usually has heartburn symptoms that she does not send a taking it most every day. She says that she is trying to be careful with her diet and her bread and carbohydrates.  She is still going to her seniors exercise class 4 days a week. At [place] 03/2016 noted that weight was up to 208 pounds. Today weight stable at 208.   11/02/2016: At last office visit added Myrbetriq for her urinary symptoms. Today she reports that she is taking this daily and that it is working very well. It is completely controlling those symptoms and is causing no adverse effects. She is taking blood pressure medications as directed. No lightheadedness or other adverse effects. She is taking simvastatin as directed. No myalgias or other adverse effects. She is taking vitamin D supplement.--2,000 units daily. Taking Calcium supplement also. She  states that she is taking the omeprazole almost every day. She tries to skip it and not have to use it all the time but usually has heartburn symptoms that she does not send a taking it most every day.  She is still going to her seniors exercise class 4 days a week.  At prior visits she said that she was "trying to be careful with her diet and her bread and carbohydrates" At her [facility] 03/2016 discussed with her that her weight was up to 208. At visit 07/20/16 discussed the weight was stable at 208. At visit 11/02/16 reviewed with her that weight is up to 211.8. She states that she does continue her senior exercise classes 4 days a week.  However admits that she is not doing a very good job with her diet. She has been educated and knows what she is supposed to do and what foods she needs to limit,  but is having a hard time putting this into action. She has no concerns to address today.  02/05/2017: Today she reports that she has been feeling good. She has no specific concerns to address today. She reports that she continues to go to her seniors exercise class 4 days a week. She continues to take Myrbetriq for her urinary symptoms. This continues to work well. It is controlling her symptoms and is causing no adverse effects. She is taking blood pressure medications as directed. No lightheadedness or other adverse effects. She is taking simvastatin as directed. No myalgias or other adverse effects. She is taking vitamin D supplement.--2,000 units daily. Taking Calcium supplement also. She states that she is taking the omeprazole almost every day. She tries to skip it and not have to use it all the time but usually has heartburn symptoms that she does not send a taking it most every day.   05/09/2017: Today I reviewed with her that at her last visit we did full panel of labs and everything looked good.  On her current medications, everything is well controlled.  Reviewed that her blood pressure reading  today is good.   Asked if she thought that her diet has gotten way out of range with the holidays or if she kept things pretty well maintained.   She says that she did not splurge too much at the holidays-- partly because her 13 year old mother-in-law had a fall-- and was in the hospital some and so she was busy with that part of the time.   She has no specific concerns today.  Everything has been stable from her standpoint--  regarding symptoms. She reports that she continues to go to her seniors exercise class 4 days a week. She continues to take Myrbetriq for her urinary symptoms. This continues to work well. It is controlling her symptoms and is causing no adverse effects. She is taking blood pressure medications as directed. No lightheadedness or other adverse effects. She is taking simvastatin as directed. No myalgias or other adverse effects. She is taking vitamin D supplement.--2,000 units daily. Taking Calcium supplement also. She states that she is taking the omeprazole almost every day. She tries to skip it and not have to use it all the time but usually has heartburn symptoms that she does not send a taking it most every day.    08/20/2017: Today I reviewed results from last labs. 05/09/2017--A1C was 6.5. Added Metformin 500mg  1 po QD. today she reports that she did add this and has been taking it daily.  It is causing no adverse effects. She reports that she has been feeling good.  Has no specific concerns to address. Reviewed that her blood pressure is excellent at 126/74 today. She continues to do seniors exercise class 4 days a week. She continues to take Myrbetriq for her urinary symptoms. This continues to work well. It is controlling her symptoms and is causing no adverse effects. She is taking blood pressure medications as directed. No lightheadedness or other adverse effects. She is taking simvastatin as directed. No myalgias or other adverse effects. She is taking vitamin D  supplement.--2,000 units daily. Taking Calcium supplement also. She states that she is taking the omeprazole almost every day. She tries to skip it and not have to use it all the time but usually has heartburn symptoms that she does not send a taking it most every day.   11/26/2017: Reviewed that at last visit 08/20/2017--- A1c was 6.5.  Continued the metformin 500 mg once daily.   Today she reports that she continues to take this Metformin as  directed.  Continues to follow low carbohydrate diet.  Continues to go to her seniors exercise class 4 days a week.  Says that these are mostly stretching type exercises.  Says that she continues to go and walk on the track for 15 minutes prior to the class. Continues to take blood pressure medications as directed.  Having no lightheadedness or other adverse effects. Continues to take simvastatin daily.  This is causing no myalgias or other adverse effects. Continues to take the vitamin D supplement at 2000 units daily.  Also taking the calcium supplement. Continues to take the Myrbetriq daily.  This is controlling her symptoms and is causing no adverse effects. Continues to need the omeprazole almost every day.  Tries not use it every single day but frequently does need it in order to control heartburn symptoms. Discussed need for follow-up with Dr. Collene Mares for follow-up colonoscopy.  She states that she has appointment scheduled to follow-up with her on August 6. She reports that she just went to "eye doctor last week ". At end of visit she reports that she does have one little bump that she wants me to look at.  Is embarrassed but wants me to check.  Has a "boil "on her left buttock.  Has been using some over-the-counter treatment called boil ease.   Review of Systems: Consitutional: No fever, chills, fatigue, night sweats, lymphadenopathy. No significant/unexplained weight changes. Eyes: No visual changes, eye redness, or discharge. ENT/Mouth: No ear pain,  sore throat, nasal drainage, or sinus pain. Cardiovascular: No chest pressure,heaviness, tightness or squeezing, even with exertion. No increased shortness of breath or dyspnea on exertion.No palpitations, edema, orthopnea, PND. Respiratory: No cough, hemoptysis, SOB, or wheezing. Gastrointestinal: No anorexia, dysphagia, reflux, pain, nausea, vomiting, hematemesis, diarrhea, constipation, BRBPR, or melena. Breast: No mass, nodules, bulging, or retraction. No skin changes or inflammation. No nipple discharge. No lymphadenopathy. Genitourinary: No dysuria, hematuria, incontinence, vaginal discharge, pruritis, burning, abnormal bleeding, or pain. Musculoskeletal: No decreased ROM, No joint pain or swelling. No significant pain in neck, back, or extremities. Skin: No rash, pruritis, or concerning lesions. Neurological: No headache, dizziness, syncope, seizures, tremors, memory loss, coordination problems, or paresthesias. Psychological: No anxiety, depression, hallucinations, SI/HI. Endocrine: No polydipsia, polyphagia, polyuria, or known diabetes.No increased fatigue. No palpitations/rapid heart rate. No significant/unexplained weight change. All other systems were reviewed and are otherwise negative.  Past Medical History:  Diagnosis Date  . Glaucoma   . Hyperlipidemia   . Hypertension      Past Surgical History:  Procedure Laterality Date  . BREAST BIOPSY Right 09/29/2015  . BREAST EXCISIONAL BIOPSY Right 11/04/2015   papilloma  . BREAST LUMPECTOMY WITH RADIOACTIVE SEED LOCALIZATION Right 11/04/2015   Procedure: BREAST LUMPECTOMY WITH RADIOACTIVE SEED LOCALIZATION;  Surgeon: Autumn Messing III, MD;  Location: [place];  Service: General;  Laterality: Right;  . LIPOMA EXCISION      Home Meds:  Outpatient Medications Prior to Visit  Medication Sig Dispense Refill  . amLODipine (NORVASC) 5 MG tablet Take 1 tablet (5 mg total) by mouth daily. 90 tablet 3  . ASPIRIN ADULT LOW  STRENGTH 81 MG EC tablet TAKE 1 TABLET BY MOUTH EVERY DAY 30 tablet 11  . Calcium 500 MG CHEW Chew 2 tablets (1,000 mg total) by mouth daily. 30 tablet 0  . Cholecalciferol (VITAMIN D) 2000 units tablet Take 1 tablet (2,000 Units total) by mouth daily. 90 tablet 2  . lisinopril (PRINIVIL,ZESTRIL) 10 MG tablet Take 1 tablet (10  mg total) by mouth daily. 90 tablet 1  . metFORMIN (GLUCOPHAGE) 500 MG tablet Take 1 tablet (500 mg total) by mouth daily. 30 tablet 5  . Multiple Vitamin (MULTIVITAMIN) tablet Take 1 tablet by mouth daily.    Marland Kitchen omeprazole (PRILOSEC) 20 MG capsule Take 1 capsule (20 mg total) by mouth daily. 90 capsule 2  . simvastatin (ZOCOR) 40 MG tablet Take 1 tablet (40 mg total) by mouth at bedtime. 90 tablet 2   No facility-administered medications prior to visit.     Allergies:  Allergies  Allergen Reactions  . Latex   . Lipitor [Atorvastatin] Other (See Comments)    Made mouth very dry and lips discolored    Social History   Socioeconomic History  . Marital status: Married    Spouse name: Not on file  . Number of children: Not on file  . Years of education: Not on file  . Highest education level: Not on file  Occupational History  . Not on file  Social Needs  . Financial resource strain: Not on file  . Food insecurity:    Worry: Not on file    Inability: Not on file  . Transportation needs:    Medical: Not on file    Non-medical: Not on file  Tobacco Use  . Smoking status: Former Smoker    Last attempt to quit: 05/01/1990    Years since quitting: 27.5  . Smokeless tobacco: Never Used  Substance and Sexual Activity  . Alcohol use: No  . Drug use: No  . Sexual activity: Not Currently  Lifestyle  . Physical activity:    Days per week: Not on file    Minutes per session: Not on file  . Stress: Not on file  Relationships  . Social connections:    Talks on phone: Not on file    Gets together: Not on file    Attends religious service: Not on file     Active member of club or organization: Not on file    Attends meetings of clubs or organizations: Not on file    Relationship status: Not on file  . Intimate partner violence:    Fear of current or ex partner: Not on file    Emotionally abused: Not on file    Physically abused: Not on file    Forced sexual activity: Not on file  Other Topics Concern  . Not on file  Social History Narrative   Entered 06/2014:    Married x 48 years.   2 Children--2 sons   She exercises 4 days a week. Goes to a  "seniors exercise class"--- says that this is mostly stretching type exercises.  Says that she does go to the facility early and walks the track for 15 minutes prior to the class. No lower extremity claudication symptoms with this and no angina symptoms with this exertion.  Family History  Problem Relation Age of Onset  . Diabetes Sister   . Diabetes Brother   . Diabetes Sister   . Diabetes Sister   . Diabetes Sister   . Diabetes Sister      Physical Exam: Blood pressure 132/70, pulse 65, temperature 97.7 F (36.5 C), temperature source Oral, resp. rate 16, height 5\' 5"  (1.651 m), weight 96.1 kg (211 lb 12.8 oz), SpO2 96 %., There is no height or weight on file to calculate BMI. General: Appears in no acute distress. Neck: Supple. No thyromegaly. No lymphadenopathy. No carotid bruits. Lungs: Clear bilaterally  to auscultation without wheezes, rales, or rhonchi. Breathing is unlabored. Heart: RRR with S1 S2. No murmurs, rubs, or gallops. Abdomen: Soft, non-tender, non-distended with normoactive bowel sounds. No hepatomegaly. No rebound/guarding. No obvious abdominal masses. Musculoskeletal:  Strength and tone normal for age. Extremities/Skin: Warm and dry.  No LE edema.  Left Buttock---toward "crease" on left buttock----but not deep in crease--- small papule present. Approx 25mm diameter area of firmness. Non fluctuant. Very small.  Not amenable to I & D, Neuro: Alert and oriented X 3. Moves  all extremities spontaneously. Gait is normal. CNII-XII grossly in tact. Psych:  Responds to questions appropriately with a normal affect.      Diabetic Foot Exam: She does have a callus on the plantar surface of her right foot at the "ball of her foot " Remainder of inspection is normal. Sensation intact. Dorsalis pedis and posterior tibial pulses 2+ throughout.   Assessment/Plan:  72 y.o. y/o female here for  Small Abscess-- Left Buttock-- 11/26/2017: Add Abx. If worsens or does not resolve then return for follow-up.   Diabetes mellitus without complication  At visit 1/44/81 discussed the fact that she already has routine eye exams as she has glaucoma. Says her eye doctor is aware that she has diabetes and they told her she had no signs of diabetic retinopathy. At [place] 05/09/2017--- discussed eye exams.  She states that she goes routinely every 1-2 years. At visit 12/16/15 discussed proper foot care. She is educated about this and aware to avoid any type of wound to the foot.  02/05/2017: Told her the next time she goes for eye exam to have them to send a copy of their note to [place].  ---------------Also told her to inform them that she does have diabetes even though it is "diet controlled ".  05/09/2017: Check A1C -----------Added Metformin 500mg  QD when got that result.  08/20/2017: She did add the metformin 500 mg daily in January 2019.  She is taking this daily with no adverse effects.  Recheck A1c. 11/26/2017: She is taking metformin 500 mg once daily.  She is following low carbohydrate diet the best she can.  She is going to seniors exercise class.  She is aware of proper foot care.  She goes to routine eye exam.  Recheck A1c today.  Last microalbumin 01/2017.  She is on aspirin, ACE inhibitor, statin.  Hyperlipidemia When she was seen here as a patient in the past she had hyperlipidemia.  When she was seen here 06/11/2014 she was not on any medication for this.  06/11/2014 lipid panel showed  LDL 169. She then started simvastatin 40 mg. Follow-up lipid panel 07/24/14 showed LDL 106. Continued simvastatin 40 mg. Rechecked  FLP and LFT  12/10/14.----- labs were stable. She was to continue current medication and follow low carbohydrate handout 11/2015---FLP at goal 04/06/2016--- reviewed that lipid panel has been at goal. LDL was 72 and then at 96 at recheck. LFTis normal. Continue current dose of statin. 07/20/2016: She is fasting. Recheck FLP/LFT 11/02/2016: FLP LFT were good 3/18. Continue current dose statin. 02/05/17--- she is on statin--- she is fasting. Recheck FLP LFT to monitor. 05/09/2017: She is on statin.  She had FLP 02/05/17 with LDL 93.  LFTs normal.  Continue current medication. 08/20/2017: She is on statin. She is fasting. Recheck FLP/LFT 11/26/2017: She is on statin.  She had FLP LFT 4/19.  Continue current statin the same.  HYPERTENSION, BENIGN SYSTEMIC: 11/02/2016:   Blood pressure is at goal and  controlled today. She is on ACE Inh for renal protection, given her diabetes. BMET normal/stable at last check  02/05/17--- blood pressure is slightly high today. However it was excellent at her last visit. 10/2016 BP was 126/68. Therefore at this time will just continue current medications but will monitor. BP --------------elevated again at next visit then we'll adjust medications. 11/26/2017------ blood pressure is well controlled at goal.  She is on ACE inhibitor.  Lab was checked 4/19 and was good.  Continue current medication.  Vitamin D deficiency - DG Bone Density; Future 08/20/2017: She had follow-up DEXA scan on 12/07/16. T score -1.3. Osteopenia. Continue calcium, vitamin D, weightbearing exercise. 11/26/2017: We recheck vitamin D level 10/18 and it was good at 34.  Will wait and check this annually.  Osteopenia, unspecified location 11/26/2017: She had follow-up DEXA scan on 12/07/16. T score -1.3. Osteopenia. Continue calcium, vitamin D, weightbearing  exercise.   Postmenopausal 11/26/2017: She had follow-up DEXA scan on 12/07/16. T score -1.3. Osteopenia. Continue calcium, vitamin D, weightbearing exercise.   Estrogen deficiency - DG Bone Density; Future 11/26/2017: She had follow-up DEXA scan on 12/07/16. T score -1.3. Osteopenia. Continue calcium, vitamin D, weightbearing exercise.   OAB (overactive bladder) 07/20/2016: At visit today she reports symptoms consistent with overactive bladder and urge incontinence. Will start Myrbetriq. She is to call me if this causes adverse effects or does not control her symptoms. - mirabegron ER (MYRBETRIQ) 50 MG TB24 tablet; Take 1 tablet (50 mg total) by mouth daily.  Dispense: 30 tablet; Refill: 11 11/26/2017: Symptoms are controlled with current medication. Causing no adverse effects. Continue Myrbetriq  Vitamin D deficiency - Vit D  25 hydroxy (rtn osteoporosis monitoring) 06/11/14 labs vitamin D was 22. At that time she did start vitamin D 2000 IUs daily. Verified that she is taking this at her visit 12/10/14. Recheck vitamin D level at that visit. 12/16/15 vitamin D level ---43. Continue current dose. 04/06/16--- she does continue taking vitamin D 2000 units daily. 11/26/2017: Reviewed that last vitamin D level was 11/2015 and was good at 43 and level prior to that was 38. Has been > 1 year since checked level--recheck now.   Glaucoma At [facility] 06/11/2014:She reports that she has seen an eye doctor and has been told she has glaucoma. At [place] 12/10/2014--reviewed that her last eye exam was July 2015. And I discussed that she needs to schedule annual follow-up eye exam she says that she usually goes every 2 years. Then asked about the glaucoma and she says that she had glaucoma about 10 years ago but she had treatment with laser surgery to correct that. At [place] 12/16/15 she reports that her eye doctor is aware of her diabetes and that at her last eye exam a told her no signs of diabetic change. She does continue to  follow up with her eye doctor routinely. 11/26/2017: Cont routine eye exams  GERD At office visit 12/16/2015 added omeprazole 20 mg daily. At that visit I also discussed stopping eating and drinking couple hours prior to going to sleep. He states that she already had quit doing all eating and drinking other than taking her nighttime pill with one sip of water and still has reflux when she lies down despite these changes. Also has some reflux symptoms during the day. 04/06/2016--- she states that she tries to go without the omeprazole some but ends up needing it most every day. 11/26/2017:-- she states that she tries to go without the omeprazole some  but ends up needing it most every day.  THE FOLLOWING IS COPIED FROM HER CPE NOTE 06/11/2014:  Visit for preventive health examination  A. Screening Labs: - CBC with Differential/Platelet - COMPLETE METABOLIC PANEL WITH GFR - Lipid panel - Hemoglobin A1c - TSH - Vit D  25 hydroxy (rtn osteoporosis monitoring) - MM Digital Screening; Future  B. Pap: She reports that she did see gynecology in the past but that she probably last saw them in the 1990s. Is that back then her Pap smears were all normal. She defers further pelvic exams or Pap smears.  C. Screening Mammogram: 06/11/2014: "She reports that she has had no mammogram since the 1990s. She is agreeable for me to schedule follow-up mammogram." She had mammogram 07/08/14 and further evaluation was needed so they did that 07/14/2014. Negative. ----ADDENDUM ADDED 07/20/2016----She had Mammogram, Biopsy 10/2015--------  D. DEXA/BMD:  06/11/2014: She reports that she has not had a bone density test recently and she is agreeable for me to schedule follow-up bone density test. She had bone density scan 07/08/14. We have already called her regarding those results and gave her instructions. This showed osteopenia. We recommended her continue her weightbearing exercise and also take calcium and vitamin D.  Today 12/10/14 she states that she is taking 1500 mg of calcium a day and 2000 units vitamin D daily.  E. Colorectal Cancer Screening: She reports that she had a colonoscopy around 2007 that was normal and was performed by Dr. Collene Mares ---------------------AT 02/24/15 OV----I HAD HER SIGN RELEASE TO GET RECORD FROM DR [facility]----------------------- ----------- 05/09/2017--------- I reviewed that I never got record from Dr. Collene Mares.  At today's visit told patient to call Dr. Lorie Apley office and find out date of last colonoscopy and when next colonoscopy is due.  Also I wrote this on her AVS as a reminder.  She voices understanding and agrees to follow-up with this. ADDENDUM ADDED 05/14/2017: RECEIVED COLONOSCOPY REPORT FROM DR MANN--PROCEDURE PERFORMED 05/18/2006-----REPEAT COLONOSCOPY 5 YEARS AS PT HAS FAMILY HISTORY IN FIRST DEGREE RELATIVE --WILL HAVE STAFF INFORM PT AND FOR PT TO SCHEDULE F/U WITH DR [facility]  11/26/2017: She reports that she does have an appointment scheduled with Dr. Collene Mares August 6.  F. Immunizations:  Influenza: ----Influenza vaccine given here 06/11/14 Tetanus: ----T dap given here 06/11/14 Pneumococcal:---- Prevnar 13 given here 06/11/14   ------Pneumovax 23--Given here 12/10/2014 Zostavax:  She did go get the Zostavax at the pharmacy. Sometime between the 2016 and 2017 at [place].   At [place] 02/05/17 reviewed with her that her last A1c was excellent. Discussed that we could wait 6 months between visits if she wants. She states that she wants to come every 3 months and keep monitor this closely. Says that "10 people are diabetic and my family--- brothers, sisters---" She is to schedule follow-up office visit 3 months Follow-up sooner if needed.  Signed, [address] [place], [place], [facility] 11/26/2017 7:52 AM

## 2017-11-26 NOTE — Addendum Note (Signed)
Addended by: Dena Billet B on: 11/26/2017 08:24 AM   Modules accepted: Orders

## 2017-11-27 LAB — HEMOGLOBIN A1C
HEMOGLOBIN A1C: 6.6 %{Hb} — AB (ref <5.7)
Mean Plasma Glucose: 143 (calc)
eAG (mmol/L): 7.9 (calc)

## 2017-12-04 DIAGNOSIS — Z800 Family history of malignant neoplasm of digestive organs: Secondary | ICD-10-CM | POA: Diagnosis not present

## 2017-12-04 DIAGNOSIS — Z1211 Encounter for screening for malignant neoplasm of colon: Secondary | ICD-10-CM | POA: Diagnosis not present

## 2017-12-04 DIAGNOSIS — K219 Gastro-esophageal reflux disease without esophagitis: Secondary | ICD-10-CM | POA: Diagnosis not present

## 2017-12-06 ENCOUNTER — Other Ambulatory Visit: Payer: Self-pay | Admitting: Physician Assistant

## 2017-12-10 ENCOUNTER — Ambulatory Visit
Admission: RE | Admit: 2017-12-10 | Discharge: 2017-12-10 | Disposition: A | Discharge status: Home / Self Care | Payer: Medicare (Managed Care) | Source: Ambulatory Visit | Attending: Physician Assistant | Admitting: Physician Assistant

## 2017-12-10 DIAGNOSIS — Z1231 Encounter for screening mammogram for malignant neoplasm of breast: Secondary | ICD-10-CM

## 2017-12-10 LAB — HM MAMMOGRAPHY

## 2017-12-10 IMAGING — MG DIGITAL SCREENING BILATERAL MAMMOGRAM WITH TOMO AND CAD
8 series · 8 of 24 positions shown · non-contrast
Comparison: Previous exam(s).

CLINICAL DATA: Screening.

EXAM:
DIGITAL SCREENING BILATERAL MAMMOGRAM WITH TOMO AND CAD

[L CC synth-2D]
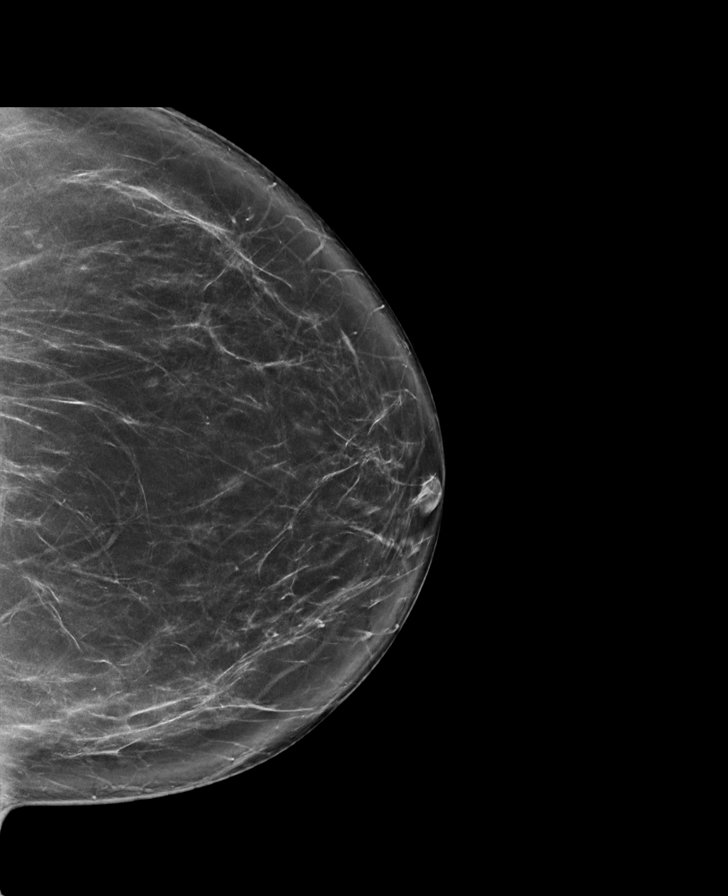

[R CC synth-2D]
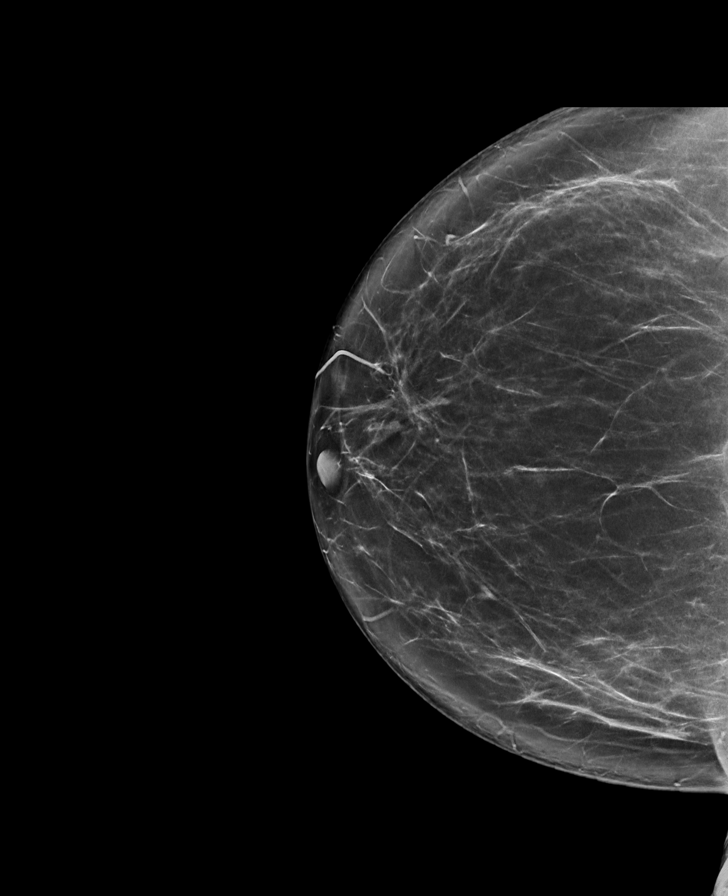

[L MLO synth-2D]
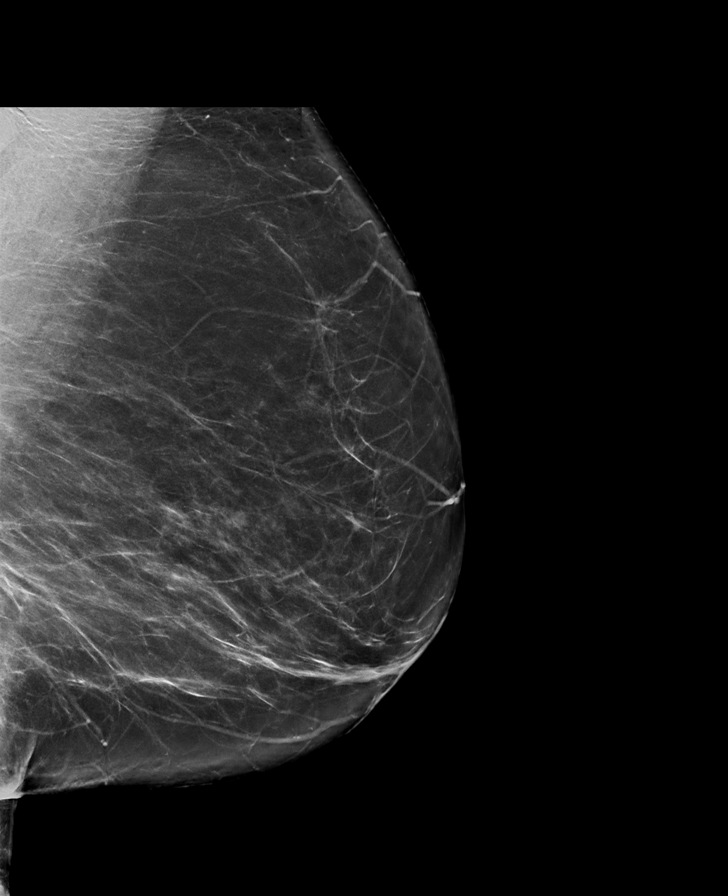

[R MLO synth-2D]
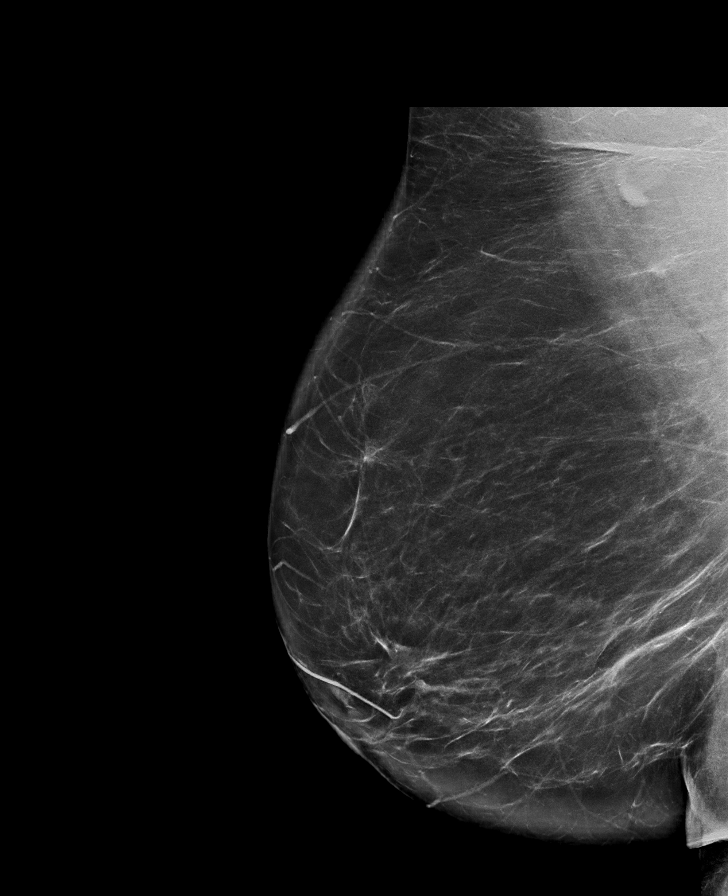

[R CC tomo · tomo slice 46/91.0]
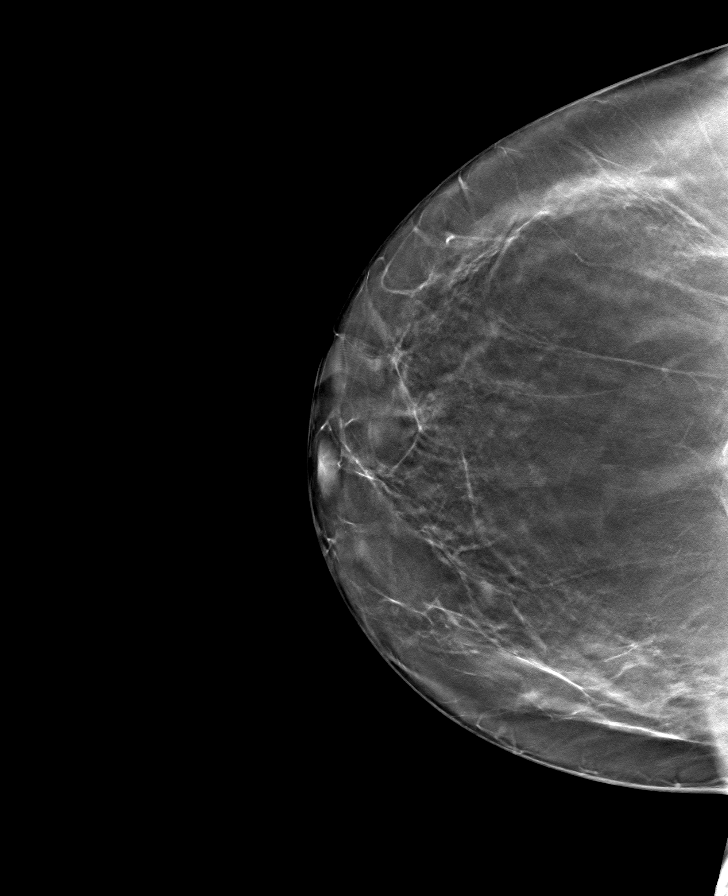

[L CC tomo · tomo slice 47/92.0]
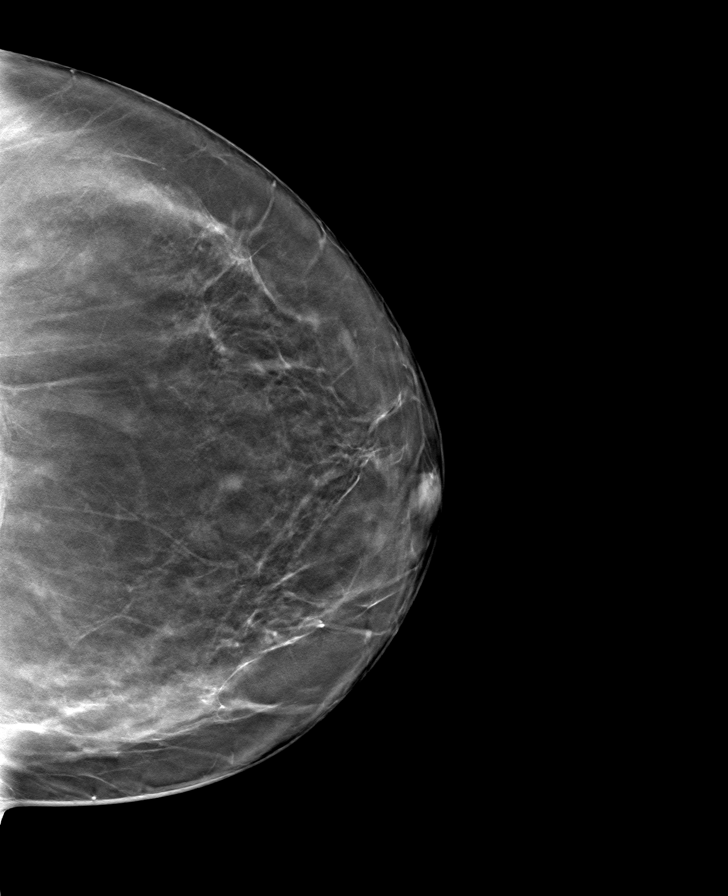

[R MLO tomo · tomo slice 50/99.0]
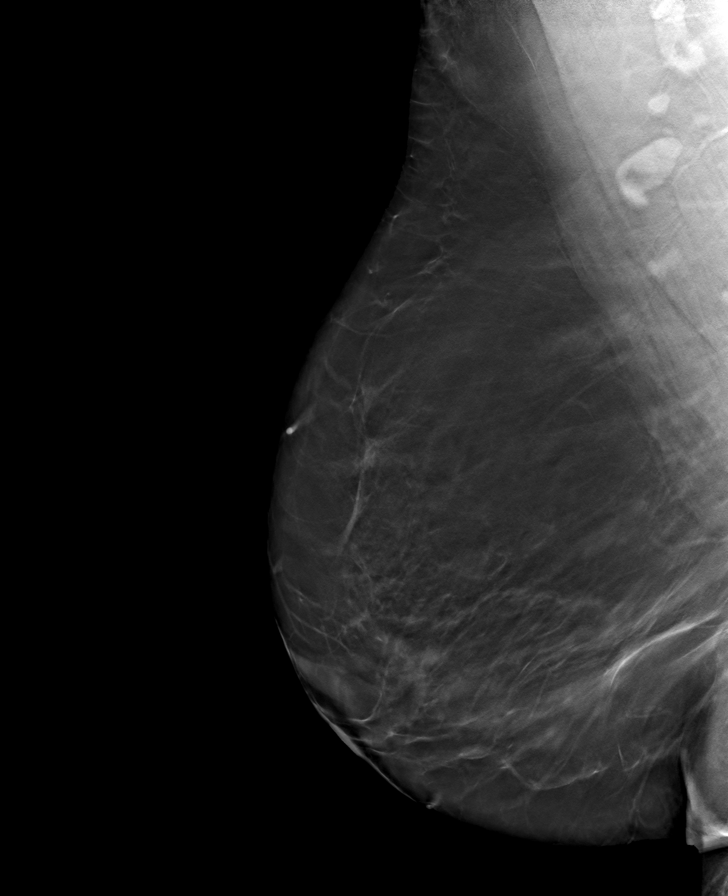

[L MLO tomo · tomo slice 48/95.0]
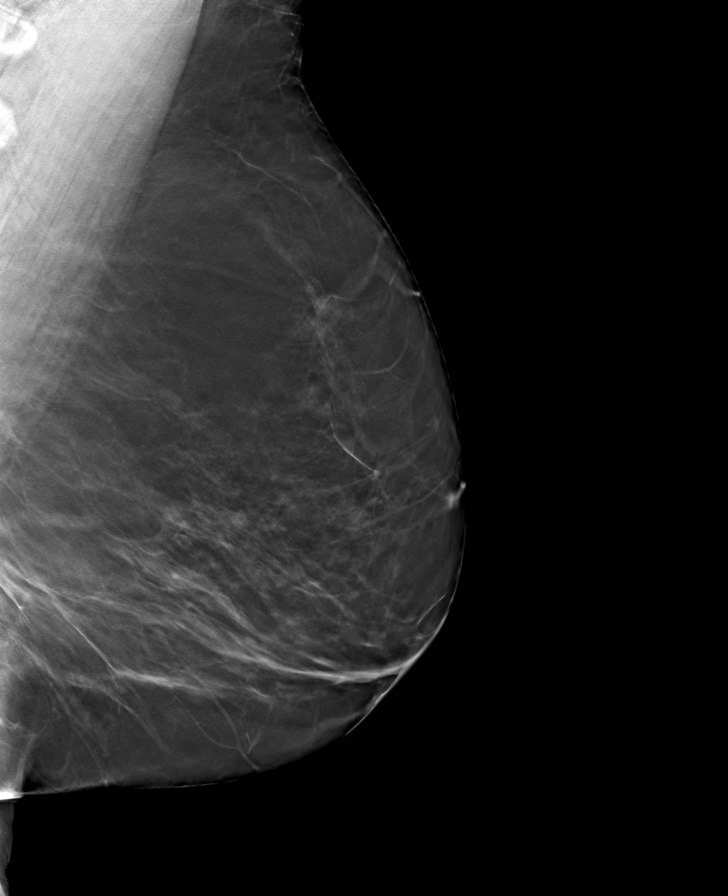

[8 of 24 positions shown; findings below may reference images not displayed]

ACR Breast Density Category b: There are scattered areas of
fibroglandular density.
FINDINGS: There are no findings suspicious for malignancy. Images were
processed with CAD.
IMPRESSION: No mammographic evidence of malignancy. A result letter of this
screening mammogram will be mailed directly to the patient.

RECOMMENDATION:
Screening mammogram in one year. (Code:[TQ])

BI-RADS CATEGORY  1: Negative.

## 2017-12-16 ENCOUNTER — Other Ambulatory Visit: Payer: Self-pay | Admitting: Physician Assistant

## 2018-01-11 DIAGNOSIS — Z1211 Encounter for screening for malignant neoplasm of colon: Secondary | ICD-10-CM | POA: Diagnosis not present

## 2018-01-11 DIAGNOSIS — Z800 Family history of malignant neoplasm of digestive organs: Secondary | ICD-10-CM | POA: Diagnosis not present

## 2018-01-11 DIAGNOSIS — K5730 Diverticulosis of large intestine without perforation or abscess without bleeding: Secondary | ICD-10-CM | POA: Diagnosis not present

## 2018-01-11 LAB — HM COLONOSCOPY

## 2018-02-15 ENCOUNTER — Other Ambulatory Visit: Payer: Self-pay | Admitting: Physician Assistant

## 2018-02-15 DIAGNOSIS — I10 Essential (primary) hypertension: Secondary | ICD-10-CM

## 2018-02-20 ENCOUNTER — Other Ambulatory Visit: Payer: Self-pay | Admitting: Physician Assistant

## 2018-02-21 ENCOUNTER — Other Ambulatory Visit: Payer: Self-pay | Admitting: Physician Assistant

## 2018-02-21 DIAGNOSIS — K219 Gastro-esophageal reflux disease without esophagitis: Secondary | ICD-10-CM

## 2018-02-27 ENCOUNTER — Ambulatory Visit: Payer: Medicare HMO | Admitting: Physician Assistant

## 2018-03-01 ENCOUNTER — Other Ambulatory Visit: Payer: Self-pay

## 2018-03-01 ENCOUNTER — Ambulatory Visit (INDEPENDENT_AMBULATORY_CARE_PROVIDER_SITE_OTHER): Payer: Medicare HMO | Admitting: Family Medicine

## 2018-03-01 ENCOUNTER — Encounter: Payer: Self-pay | Admitting: Family Medicine

## 2018-03-01 ENCOUNTER — Telehealth: Payer: Self-pay | Admitting: *Deleted

## 2018-03-01 VITALS — BP 134/68 | HR 68 | Temp 98.7°F | Resp 16 | Ht 65.0 in | Wt 203.0 lb

## 2018-03-01 DIAGNOSIS — N183 Chronic kidney disease, stage 3 unspecified: Secondary | ICD-10-CM

## 2018-03-01 DIAGNOSIS — L0291 Cutaneous abscess, unspecified: Secondary | ICD-10-CM

## 2018-03-01 DIAGNOSIS — Z23 Encounter for immunization: Secondary | ICD-10-CM

## 2018-03-01 DIAGNOSIS — I10 Essential (primary) hypertension: Secondary | ICD-10-CM | POA: Diagnosis not present

## 2018-03-01 DIAGNOSIS — K219 Gastro-esophageal reflux disease without esophagitis: Secondary | ICD-10-CM | POA: Diagnosis not present

## 2018-03-01 DIAGNOSIS — E785 Hyperlipidemia, unspecified: Secondary | ICD-10-CM

## 2018-03-01 DIAGNOSIS — E119 Type 2 diabetes mellitus without complications: Secondary | ICD-10-CM

## 2018-03-01 MED ORDER — DOXYCYCLINE HYCLATE 100 MG PO TABS: 100.0000 mg | ORAL_TABLET | Freq: Two times a day (BID) | ORAL | 0 refills | Status: AC

## 2018-03-01 NOTE — Telephone Encounter (Signed)
Received call from patient.   Reports that she thought ABTx was to be prescribed for boil or abscess.   Provider made aware and NO given.   Prescription sent to pharmacy.

## 2018-03-01 NOTE — Progress Notes (Signed)
Patient ID: Heather Gross, female    DOB: Sep 03, 1945, 72 y.o.   MRN: [id number]  PCP: Delsa Grana, PA-C  Chief Complaint  Patient presents with  . Follow-up    is fasting    Subjective:   Heather Gross is a 72 y.o. female, presents to clinic with CC of HTN DM and HLD f/up.  HTN, BP well controlled with two meds, Norvasc 5mg  and lisinopril 10 mg daily, gets rechecked by PCP 4x a year, here for the recheck, will be switching to my pt panel, patient reports no side effects with medication, her BP typically runs around 130/70's, no lightheadedness, CP, SOB  HLD, patient is on simvastatin 40 mg daily, she denies any side effects, denies myalgias. Last FLP done 6 months ago, total cholesterol and LDL at goal but HDL low.  Patient is fasting today for repeated labs  Has chronic kidney disease stage III last serum creatinine 1.10 and GFR 58, on ACEI.  She denies any swelling, denies change to UO, no decreased UO.   DM - on metformin 500 mg once daily she reports excellent compliance, she tries to be very healthy and is focus on low-carb diet, she continues to exercise at least 4 days a week seniors exercise classes and walking on a track.  She continues to take omeprazole for GERD and indigestion, takes most days, no worsening  She complains of a recurrent painful bump on her right buttocks.  It did improve in the past with antibiotics, pain is moderate, area is becoming enlarged and tender to touch, no spontaneous drainage, she cannot visualize the area is not sure if there is any redness.  Patient also has osteopenia and vitamin D deficiency, she continues to supplement calcium and vitamin D   Patient Active Problem List   Diagnosis Date Noted  . CKD (chronic kidney disease) stage 3, GFR 30-59 ml/min (HCC) 02/05/2017  . Osteopenia 11/02/2016  . Postmenopausal 11/02/2016  . Estrogen deficiency 11/02/2016  . Vitamin D deficiency 12/10/2014  . Hyperlipidemia 06/11/2014  . Diabetes  mellitus without complication ([place])   . Glaucoma   . THROMBOCYTOPENIA 06/28/2006  . HYPERTENSION, BENIGN SYSTEMIC 06/28/2006  . GASTROESOPHAGEAL REFLUX, NO ESOPHAGITIS 06/28/2006  . ECZEMA, ATOPIC DERMATITIS 06/28/2006    Current Meds  Medication Sig  . amLODipine (NORVASC) 5 MG tablet Take 1 tablet (5 mg total) by mouth daily.  . ASPIRIN ADULT LOW STRENGTH 81 MG EC tablet TAKE 1 TABLET BY MOUTH EVERY DAY  . Calcium 500 MG CHEW Chew 2 tablets (1,000 mg total) by mouth daily.  . [employer] D3 2000 units CAPS TAKE 1 CAPSULE BY MOUTH EVERY DAY  . lisinopril (PRINIVIL,ZESTRIL) 10 MG tablet TAKE 1 TABLET BY MOUTH EVERY DAY  . metFORMIN (GLUCOPHAGE) 500 MG tablet TAKE 1 TABLET BY MOUTH EVERY DAY  . Multiple Vitamin (MULTIVITAMIN) tablet Take 1 tablet by mouth daily.  Marland Kitchen omeprazole (PRILOSEC) 20 MG capsule TAKE 1 CAPSULE BY MOUTH EVERY DAY  . simvastatin (ZOCOR) 40 MG tablet TAKE 1 TABLET BY MOUTH EVERYDAY AT BEDTIME     Review of Systems  Constitutional: Negative.   HENT: Negative.   Eyes: Negative.   Respiratory: Negative.   Cardiovascular: Negative.   Gastrointestinal: Negative.   Endocrine: Negative.   Genitourinary: Negative.   Musculoskeletal: Negative.   Skin: Negative.   Allergic/Immunologic: Negative.   Neurological: Negative.   Hematological: Negative.   Psychiatric/Behavioral: Negative.   All other systems reviewed and are negative.  Objective:    Vitals:   03/01/18 1011  BP: 134/68  Pulse: 68  Resp: 16  Temp: 98.7 F (37.1 C)  TempSrc: Oral  SpO2: 98%  Weight: 203 lb (92.1 kg)  Height: 5\' 5"  (1.651 m)      Physical Exam  Constitutional: She is oriented to person, place, and time. She appears well-developed and well-nourished.  Non-toxic appearance. No distress.  Well-appearing elderly female but appears stated age, no acute distress  HENT:  Head: Normocephalic and atraumatic.  Right Ear: External ear normal.  Left Ear: External ear normal.  Nose: Nose  normal.  Mouth/Throat: Uvula is midline, oropharynx is clear and moist and mucous membranes are normal. No oropharyngeal exudate.  Eyes: Pupils are equal, round, and reactive to light. Conjunctivae, EOM and lids are normal. No scleral icterus.  Neck: Normal range of motion and phonation normal. Neck supple. No tracheal deviation present. No thyromegaly present.  Cardiovascular: Normal rate, regular rhythm, normal heart sounds, intact distal pulses and normal pulses. Exam reveals no gallop and no friction rub.  No murmur heard. Pulses:      Radial pulses are 2+ on the right side, and 2+ on the left side.       Posterior tibial pulses are 2+ on the right side, and 2+ on the left side.  Pulmonary/Chest: Effort normal and breath sounds normal. No stridor. No respiratory distress. She has no wheezes. She has no rhonchi. She has no rales. She exhibits no tenderness.  Abdominal: Soft. Normal appearance and bowel sounds are normal. She exhibits no distension and no mass. There is no tenderness. There is no rebound and no guarding.  Musculoskeletal: Normal range of motion. She exhibits no edema or deformity.  Lymphadenopathy:    She has no cervical adenopathy.  Neurological: She is alert and oriented to person, place, and time. She exhibits normal muscle tone. Coordination and gait normal.  Skin: Skin is warm, dry and intact. Capillary refill takes less than 2 seconds. No rash noted. She is not diaphoretic. No pallor.  2.5 x 1.5 cm area of induration and tenderness to right buttock, no fluctuance, no drainage  Psychiatric: She has a normal mood and affect. Her speech is normal and behavior is normal.  Nursing note and vitals reviewed.         Assessment & Plan:   72 year old African-American female presents for routine follow-up and recheck of multiple chronic conditions  Problem List Items Addressed This Visit      Cardiovascular and Mediastinum   HYPERTENSION, BENIGN SYSTEMIC - Primary     Well-controlled on lisinopril 10 mg and Norvasc 5 mg daily, no side effects Recheck BMP Continue meds       Relevant Orders   COMPLETE METABOLIC PANEL WITH GFR (Completed)     Digestive   GASTROESOPHAGEAL REFLUX, NO ESOPHAGITIS    Well-controlled, continue omeprazole as needed        Endocrine   Diabetes mellitus without complication ([place])    Patient does not check her sugars often, will recheck A1c and fasting BMT, has been well controlled in the past with only 500 mg metformin daily, no SE      Relevant Orders   CBC with Differential/Platelet (Completed)   COMPLETE METABOLIC PANEL WITH GFR (Completed)   Lipid panel (Completed)   Hemoglobin A1c (Completed)     Genitourinary   CKD (chronic kidney disease) stage 3, GFR 30-59 ml/min (HCC)   Relevant Orders   COMPLETE METABOLIC PANEL WITH GFR (  Completed)     Other   Hyperlipidemia    Tolerating statin, no side effects, recheck fasting lipid panel today, continue current medications, continue diet and exercise      Relevant Orders   Lipid panel (Completed)   Hemoglobin A1c (Completed)    Other Visit Diagnoses    Need for immunization against influenza       Relevant Orders   Flu Vaccine QUAD 36+ mos IM (Completed)   Abscess       Indurated left buttocks, recurrent in same location, try antibiotics, warm soaks, recheck in the next week, may need I&D if continues or worsening     Rx for doxy sent in to cover possible MRSA     Delsa Grana, PA-C 03/01/18 10:40 AM

## 2018-03-02 LAB — CBC WITH DIFFERENTIAL/PLATELET
BASOS PCT: 1 %
Basophils Absolute: 71 cells/uL (ref 0–200)
Eosinophils Absolute: 213 cells/uL (ref 15–500)
Eosinophils Relative: 3 %
HEMATOCRIT: 40.2 % (ref 35.0–45.0)
HEMOGLOBIN: 13.4 g/dL (ref 11.7–15.5)
LYMPHS ABS: 2336 {cells}/uL (ref 850–3900)
MCH: 30.8 pg (ref 27.0–33.0)
MCHC: 33.3 g/dL (ref 32.0–36.0)
MCV: 92.4 fL (ref 80.0–100.0)
MPV: 12.1 fL (ref 7.5–12.5)
Monocytes Relative: 6.6 %
NEUTROS ABS: 4012 {cells}/uL (ref 1500–7800)
Neutrophils Relative %: 56.5 %
Platelets: 188 10*3/uL (ref 140–400)
RBC: 4.35 10*6/uL (ref 3.80–5.10)
RDW: 12.9 % (ref 11.0–15.0)
Total Lymphocyte: 32.9 %
WBC: 7.1 10*3/uL (ref 3.8–10.8)
WBCMIX: 469 {cells}/uL (ref 200–950)

## 2018-03-02 LAB — COMPLETE METABOLIC PANEL WITH GFR
AG Ratio: 1.8 (calc) (ref 1.0–2.5)
ALBUMIN MSPROF: 4.4 g/dL (ref 3.6–5.1)
ALKALINE PHOSPHATASE (APISO): 65 U/L (ref 33–130)
ALT: 12 U/L (ref 6–29)
AST: 19 U/L (ref 10–35)
BUN/Creatinine Ratio: 13 (calc) (ref 6–22)
BUN: 14 mg/dL (ref 7–25)
CALCIUM: 9.7 mg/dL (ref 8.6–10.4)
CO2: 26 mmol/L (ref 20–32)
CREATININE: 1.1 mg/dL — AB (ref 0.60–0.93)
Chloride: 106 mmol/L (ref 98–110)
GFR, Est African American: 58 mL/min/{1.73_m2} — ABNORMAL LOW (ref >=60)
GFR, Est Non African American: 50 mL/min/{1.73_m2} — ABNORMAL LOW (ref >=60)
GLOBULIN: 2.5 g/dL (ref 1.9–3.7)
Glucose, Bld: 124 mg/dL — ABNORMAL HIGH (ref 65–99)
Potassium: 4.4 mmol/L (ref 3.5–5.3)
SODIUM: 141 mmol/L (ref 135–146)
TOTAL PROTEIN: 6.9 g/dL (ref 6.1–8.1)
Total Bilirubin: 0.4 mg/dL (ref 0.2–1.2)

## 2018-03-02 LAB — LIPID PANEL
CHOL/HDL RATIO: 3.6 (calc) (ref <5.0)
CHOLESTEROL: 159 mg/dL (ref <200)
HDL: 44 mg/dL — AB (ref >50)
LDL Cholesterol (Calc): 94 mg/dL (calc)
Non-HDL Cholesterol (Calc): 115 mg/dL (calc) (ref <130)
Triglycerides: 110 mg/dL (ref <150)

## 2018-03-02 LAB — HEMOGLOBIN A1C
Hgb A1c MFr Bld: 6.5 % of total Hgb — ABNORMAL HIGH (ref <5.7)
Mean Plasma Glucose: 140 (calc)
eAG (mmol/L): 7.7 (calc)

## 2018-03-04 ENCOUNTER — Encounter: Payer: Self-pay | Admitting: *Deleted

## 2018-03-04 NOTE — Assessment & Plan Note (Signed)
Tolerating statin, no side effects, recheck fasting lipid panel today, continue current medications, continue diet and exercise

## 2018-03-04 NOTE — Assessment & Plan Note (Signed)
Patient does not check her sugars often, will recheck A1c and fasting BMT, has been well controlled in the past with only 500 mg metformin daily, no SE

## 2018-03-04 NOTE — Assessment & Plan Note (Signed)
Well-controlled, continue omeprazole as needed

## 2018-03-04 NOTE — Assessment & Plan Note (Signed)
Well-controlled on lisinopril 10 mg and Norvasc 5 mg daily, no side effects Recheck BMP Continue meds

## 2018-03-05 ENCOUNTER — Other Ambulatory Visit: Payer: Self-pay | Admitting: Physician Assistant

## 2018-03-05 DIAGNOSIS — I10 Essential (primary) hypertension: Secondary | ICD-10-CM

## 2018-03-08 ENCOUNTER — Telehealth: Payer: Self-pay | Admitting: Family Medicine

## 2018-03-08 MED ORDER — DOXYCYCLINE HYCLATE 100 MG PO TABS: 100.0000 mg | ORAL_TABLET | Freq: Two times a day (BID) | ORAL | 0 refills | Status: DC

## 2018-03-08 NOTE — Telephone Encounter (Signed)
[phone] Patient is calling to let  You know that even after taking antibiotics, she still has a boil  Please call back and advise

## 2018-03-08 NOTE — Telephone Encounter (Signed)
Ok, doxy 100 mg PO BID x 5 days, disp 10 Would not to any more antibiotics, first line treatment is to I&D and if it keep reoccurring or never resolving, that is likely what needs to be done.

## 2018-03-08 NOTE — Telephone Encounter (Signed)
Call placed to patient and patient made aware.   Prescription sent to pharmacy.   Appointment scheduled.  

## 2018-03-08 NOTE — Telephone Encounter (Signed)
Call placed to patient.   Reports that area continues to L buttocks. Has completed all Doxycycline and has been using warm compresses and sitz baths. States that area has not ruptured, but has decreased in size. Remains firm and tender to touch.   Ok to extend ABTx until patient can be seen next week?

## 2018-03-12 ENCOUNTER — Ambulatory Visit (INDEPENDENT_AMBULATORY_CARE_PROVIDER_SITE_OTHER): Payer: Medicare HMO | Admitting: Family Medicine

## 2018-03-12 ENCOUNTER — Encounter: Payer: Self-pay | Admitting: Family Medicine

## 2018-03-12 ENCOUNTER — Other Ambulatory Visit: Payer: Self-pay

## 2018-03-12 VITALS — BP 132/68 | HR 82 | Temp 98.0°F | Resp 16 | Ht 65.0 in | Wt 208.0 lb

## 2018-03-12 DIAGNOSIS — L0291 Cutaneous abscess, unspecified: Secondary | ICD-10-CM

## 2018-03-12 NOTE — Progress Notes (Signed)
Patient ID: Heather Gross, female    DOB: [birth year], 72 y.o.   MRN: [id number]  PCP: Delsa Grana, PA-C   Chief Complaint  Patient presents with  . Abscess    was given extended ABTx for abscess- states that area has improved, but is not resolved    Subjective:   Heather Gross is a 72 y.o. female, presents to clinic with CC of right gluteal swelling and pain that has improved with warm soaks and antibiotics.  She has gotten some drainage out of it.  It is getting smaller, softer and currently has not pain to the area.  She denies any SE of antibiotics, no diarrhea, stomach upset, yeast infection.     Patient Active Problem List   Diagnosis Date Noted  . CKD (chronic kidney disease) stage 3, GFR 30-59 ml/min (HCC) 02/05/2017  . Osteopenia 11/02/2016  . Postmenopausal 11/02/2016  . Estrogen deficiency 11/02/2016  . Vitamin D deficiency 12/10/2014  . Hyperlipidemia 06/11/2014  . Diabetes mellitus without complication ([place])   . Glaucoma   . THROMBOCYTOPENIA 06/28/2006  . HYPERTENSION, BENIGN SYSTEMIC 06/28/2006  . GASTROESOPHAGEAL REFLUX, NO ESOPHAGITIS 06/28/2006  . ECZEMA, ATOPIC DERMATITIS 06/28/2006     Prior to Admission medications   Medication Sig Start Date End Date Taking? Authorizing Provider  amLODipine (NORVASC) 5 MG tablet TAKE 1 TABLET BY MOUTH EVERY DAY 03/05/18  Yes Delsa Grana, PA-C  ASPIRIN ADULT LOW STRENGTH 81 MG EC tablet TAKE 1 TABLET BY MOUTH EVERY DAY 01/09/17  Yes Orlena Sheldon, PA-C  Calcium 500 MG CHEW Chew 2 tablets (1,000 mg total) by mouth daily. 12/13/16  Yes Dena Billet B, PA-C  [facility]S D3 2000 units CAPS TAKE 1 CAPSULE BY MOUTH EVERY DAY 12/17/17  Yes Orlena Sheldon, PA-C  doxycycline (VIBRA-TABS) 100 MG tablet Take 1 tablet (100 mg total) by mouth 2 (two) times daily. 03/08/18  Yes Delsa Grana, PA-C  lisinopril (PRINIVIL,ZESTRIL) 10 MG tablet TAKE 1 TABLET BY MOUTH EVERY DAY 02/15/18  Yes Dena Billet B, PA-C  metFORMIN (GLUCOPHAGE) 500 MG tablet  TAKE 1 TABLET BY MOUTH EVERY DAY 12/06/17  Yes Dena Billet B, PA-C  Multiple Vitamin (MULTIVITAMIN) tablet Take 1 tablet by mouth daily.   Yes [provider]  omeprazole (PRILOSEC) 20 MG capsule TAKE 1 CAPSULE BY MOUTH EVERY DAY 02/21/18  Yes Susy Frizzle, MD  simvastatin (ZOCOR) 40 MG tablet TAKE 1 TABLET BY MOUTH EVERYDAY AT BEDTIME 02/20/18  Yes [place], Modena Nunnery, MD     Allergies  Allergen Reactions  . Latex   . Lipitor [Atorvastatin] Other (See Comments)    Made mouth very dry and lips discolored     Family History  Problem Relation Age of Onset  . Diabetes Sister   . Diabetes Brother   . Diabetes Sister   . Diabetes Sister   . Diabetes Sister   . Diabetes Sister      Social History   Socioeconomic History  . Marital status: Married    Spouse name: Not on file  . Number of children: Not on file  . Years of education: Not on file  . Highest education level: Not on file  Occupational History  . Not on file  Social Needs  . Financial resource strain: Not on file  . Food insecurity:    Worry: Not on file    Inability: Not on file  . Transportation needs:    Medical: Not on file    Non-medical: Not  on file  Tobacco Use  . Smoking status: Former Smoker    Last attempt to quit: 05/01/1990    Years since quitting: 27.8  . Smokeless tobacco: Never Used  Substance and Sexual Activity  . Alcohol use: No  . Drug use: No  . Sexual activity: Not Currently  Lifestyle  . Physical activity:    Days per week: Not on file    Minutes per session: Not on file  . Stress: Not on file  Relationships  . Social connections:    Talks on phone: Not on file    Gets together: Not on file    Attends religious service: Not on file    Active member of club or organization: Not on file    Attends meetings of clubs or organizations: Not on file    Relationship status: Not on file  . Intimate partner violence:    Fear of current or ex partner: Not on file     Emotionally abused: Not on file    Physically abused: Not on file    Forced sexual activity: Not on file  Other Topics Concern  . Not on file  Social History Narrative   Entered 06/2014:    Married x 48 years.   2 Children--2 sons     Review of Systems  Constitutional: Negative.   HENT: Negative.   Eyes: Negative.   Respiratory: Negative.   Cardiovascular: Negative.   Gastrointestinal: Negative.   Endocrine: Negative.   Genitourinary: Negative.   Musculoskeletal: Negative.   Skin: Negative.   Allergic/Immunologic: Negative.   Neurological: Negative.   Hematological: Negative.   Psychiatric/Behavioral: Negative.   All other systems reviewed and are negative.      Objective:    Vitals:   03/12/18 0849  BP: 132/68  Pulse: 82  Resp: 16  Temp: 98 F (36.7 C)  TempSrc: Oral  SpO2: 98%  Weight: 208 lb (94.3 kg)  Height: 5\' 5"  (1.651 m)      Physical Exam  Constitutional: She appears well-developed.  HENT:  Head: Normocephalic and atraumatic.  Nose: Nose normal.  Eyes: Conjunctivae are normal. Right eye exhibits no discharge. Left eye exhibits no discharge.  Neck: No tracheal deviation present.  Cardiovascular: Normal rate and regular rhythm.  Pulmonary/Chest: Effort normal. No stridor. No respiratory distress.  Musculoskeletal: Normal range of motion.  Neurological: She is alert. She exhibits normal muscle tone. Coordination normal.  Skin: Skin is warm and dry. No rash noted. No erythema.  Low right gluteal cleft 1.5 x 1 cm oval area of skin mildly enlarged and soft  Psychiatric: She has a normal mood and affect. Her behavior is normal.  Nursing note and vitals reviewed.         Assessment & Plan:      ICD-10-CM   1. Abscess L02.91    improving, no induration, no erythema, no tenderness - continue warm soaks, use dial/hibiclens, return if worsening    Appears much better, but not completely resolved.  Do not feel like I&D is warranted right now.   Encouraged her to return if worsening and with inflammation would need I&D.  Explained the risks and benefit.   Delsa Grana, PA-C 03/12/18 8:55 AM

## 2018-03-15 ENCOUNTER — Other Ambulatory Visit: Payer: Self-pay | Admitting: Physician Assistant

## 2018-03-15 DIAGNOSIS — E119 Type 2 diabetes mellitus without complications: Secondary | ICD-10-CM

## 2018-07-02 ENCOUNTER — Ambulatory Visit: Payer: Medicare HMO | Admitting: Family Medicine

## 2018-07-22 ENCOUNTER — Ambulatory Visit: Payer: Medicare HMO | Admitting: Family Medicine

## 2018-07-23 ENCOUNTER — Ambulatory Visit: Payer: Medicare HMO | Admitting: Family Medicine

## 2018-09-18 ENCOUNTER — Ambulatory Visit: Payer: Medicare HMO | Admitting: Family Medicine

## 2018-09-19 ENCOUNTER — Other Ambulatory Visit: Payer: Self-pay

## 2018-09-19 ENCOUNTER — Encounter: Payer: Self-pay | Admitting: Family Medicine

## 2018-09-19 ENCOUNTER — Ambulatory Visit (INDEPENDENT_AMBULATORY_CARE_PROVIDER_SITE_OTHER): Payer: Medicare HMO | Admitting: Family Medicine

## 2018-09-19 VITALS — BP 136/72 | HR 82 | Temp 98.4°F | Resp 18 | Ht 65.0 in | Wt 215.0 lb

## 2018-09-19 DIAGNOSIS — K1370 Unspecified lesions of oral mucosa: Secondary | ICD-10-CM

## 2018-09-19 DIAGNOSIS — N183 Chronic kidney disease, stage 3 unspecified: Secondary | ICD-10-CM

## 2018-09-19 DIAGNOSIS — I10 Essential (primary) hypertension: Secondary | ICD-10-CM

## 2018-09-19 DIAGNOSIS — E119 Type 2 diabetes mellitus without complications: Secondary | ICD-10-CM | POA: Diagnosis not present

## 2018-09-19 DIAGNOSIS — K219 Gastro-esophageal reflux disease without esophagitis: Secondary | ICD-10-CM | POA: Diagnosis not present

## 2018-09-19 DIAGNOSIS — E785 Hyperlipidemia, unspecified: Secondary | ICD-10-CM

## 2018-09-19 DIAGNOSIS — L819 Disorder of pigmentation, unspecified: Secondary | ICD-10-CM | POA: Diagnosis not present

## 2018-09-19 DIAGNOSIS — E1122 Type 2 diabetes mellitus with diabetic chronic kidney disease: Secondary | ICD-10-CM | POA: Diagnosis not present

## 2018-09-19 MED ORDER — KETOCONAZOLE 2 % EX SHAM: 1.0000 "application " | MEDICATED_SHAMPOO | Freq: Two times a day (BID) | CUTANEOUS | 1 refills | Status: DC | PRN

## 2018-09-19 NOTE — Assessment & Plan Note (Signed)
Blood pressure today 136/72, near goal, no side effects of medication, notes continued excellent compliance, recheck renal function

## 2018-09-19 NOTE — Patient Instructions (Signed)
Get an over the counter mouth wash for mouth sores or tenderness - something like Biotene - should help with pain and sensitivity. Avoid salty or acidic foods for a week or so.  Call us if not getting better over the next 1-2 weeks, we could call you in an additional prescription mouth wash to help if not improving on its own (Magic mouthwash)  Return for your Medicare Well visit when due this summer!  Health Maintenance, Female Adopting a healthy lifestyle and getting preventive care can go a long way to promote health and wellness. Talk with your health care provider about what schedule of regular examinations is right for you. This is a good chance for you to check in with your provider about disease prevention and staying healthy. In between checkups, there are plenty of things you can do on your own. Experts have done a lot of research about which lifestyle changes and preventive measures are most likely to keep you healthy. Ask your health care provider for more information. Weight and diet Eat a healthy diet  Be sure to include plenty of vegetables, fruits, low-fat dairy products, and lean protein.  Do not eat a lot of foods high in solid fats, added sugars, or salt.  Get regular exercise. This is one of the most important things you can do for your health. ? Most adults should exercise for at least 150 minutes each week. The exercise should increase your heart rate and make you sweat (moderate-intensity exercise). ? Most adults should also do strengthening exercises at least twice a week. This is in addition to the moderate-intensity exercise. Maintain a healthy weight  Body mass index (BMI) is a measurement that can be used to identify possible weight problems. It estimates body fat based on height and weight. Your health care provider can help determine your BMI and help you achieve or maintain a healthy weight.  For females 20 years of age and older: ? A BMI below 18.5 is considered  underweight. ? A BMI of 18.5 to 24.9 is normal. ? A BMI of 25 to 29.9 is considered overweight. ? A BMI of 30 and above is considered obese. Watch levels of cholesterol and blood lipids  You should start having your blood tested for lipids and cholesterol at 72 years of age, then have this test every 5 years.  You may need to have your cholesterol levels checked more often if: ? Your lipid or cholesterol levels are high. ? You are older than 72 years of age. ? You are at high risk for heart disease. Cancer screening Lung Cancer  Lung cancer screening is recommended for adults 14-65 years old who are at high risk for lung cancer because of a history of smoking.  A yearly low-dose CT scan of the lungs is recommended for people who: ? Currently smoke. ? Have quit within the past 15 years. ? Have at least a 30-pack-year history of smoking. A pack year is smoking an average of one pack of cigarettes a day for 1 year.  Yearly screening should continue until it has been 15 years since you quit.  Yearly screening should stop if you develop a health problem that would prevent you from having lung cancer treatment. Breast Cancer  Practice breast self-awareness. This means understanding how your breasts normally appear and feel.  It also means doing regular breast self-exams. Let your health care provider know about any changes, no matter how small.  If you are in your  your 20s or 30s, you should have a clinical breast exam (CBE) by a health care provider every 1-3 years as part of a regular health exam.  If you are 40 or older, have a CBE every year. Also consider having a breast X-ray (mammogram) every year.  If you have a family history of breast cancer, talk to your health care provider about genetic screening.  If you are at high risk for breast cancer, talk to your health care provider about having an MRI and a mammogram every year.  Breast cancer gene (BRCA) assessment is recommended  for women who have family members with BRCA-related cancers. BRCA-related cancers include: ? Breast. ? Ovarian. ? Tubal. ? Peritoneal cancers.  Results of the assessment will determine the need for genetic counseling and BRCA1 and BRCA2 testing. Cervical Cancer Your health care provider may recommend that you be screened regularly for cancer of the pelvic organs (ovaries, uterus, and vagina). This screening involves a pelvic examination, including checking for microscopic changes to the surface of your cervix (Pap test). You may be encouraged to have this screening done every 3 years, beginning at age 21.  For women ages 30-65, health care providers may recommend pelvic exams and Pap testing every 3 years, or they may recommend the Pap and pelvic exam, combined with testing for human papilloma virus (HPV), every 5 years. Some types of HPV increase your risk of cervical cancer. Testing for HPV may also be done on women of any age with unclear Pap test results.  Other health care providers may not recommend any screening for nonpregnant women who are considered low risk for pelvic cancer and who do not have symptoms. Ask your health care provider if a screening pelvic exam is right for you.  If you have had past treatment for cervical cancer or a condition that could lead to cancer, you need Pap tests and screening for cancer for at least 20 years after your treatment. If Pap tests have been discontinued, your risk factors (such as having a new sexual partner) need to be reassessed to determine if screening should resume. Some women have medical problems that increase the chance of getting cervical cancer. In these cases, your health care provider may recommend more frequent screening and Pap tests. Colorectal Cancer  This type of cancer can be detected and often prevented.  Routine colorectal cancer screening usually begins at 72 years of age and continues through 72 years of age.  Your health  care provider may recommend screening at an earlier age if you have risk factors for colon cancer.  Your health care provider may also recommend using home test kits to check for hidden blood in the stool.  A small camera at the end of a tube can be used to examine your colon directly (sigmoidoscopy or colonoscopy). This is done to check for the earliest forms of colorectal cancer.  Routine screening usually begins at age 50.  Direct examination of the colon should be repeated every 5-10 years through 72 years of age. However, you may need to be screened more often if early forms of precancerous polyps or small growths are found. Skin Cancer  Check your skin from head to toe regularly.  Tell your health care provider about any new moles or changes in moles, especially if there is a change in a mole's shape or color.  Also tell your health care provider if you have a mole that is larger than the size of a pencil   Always use sunscreen. Apply sunscreen liberally and repeatedly throughout the day.  Protect yourself by wearing long sleeves, pants, a wide-brimmed hat, and sunglasses whenever you are outside. Heart disease, diabetes, and high blood pressure  High blood pressure causes heart disease and increases the risk of stroke. High blood pressure is more likely to develop in: ? People who have blood pressure in the high end of the normal range (130-139/85-89 mm Hg). ? People who are overweight or obese. ? People who are African American.  If you are 43-31 years of age, have your blood pressure checked every 3-5 years. If you are 1 years of age or older, have your blood pressure checked every year. You should have your blood pressure measured twice-once when you are at a hospital or clinic, and once when you are not at a hospital or clinic. Record the average of the two measurements. To check your blood pressure when you are not at a hospital or clinic, you can use: ? An automated  blood pressure machine at a pharmacy. ? A home blood pressure monitor.  If you are between 15 years and 84 years old, ask your health care provider if you should take aspirin to prevent strokes.  Have regular diabetes screenings. This involves taking a blood sample to check your fasting blood sugar level. ? If you are at a normal weight and have a low risk for diabetes, have this test once every three years after 72 years of age. ? If you are overweight and have a high risk for diabetes, consider being tested at a younger age or more often. Preventing infection Hepatitis B  If you have a higher risk for hepatitis B, you should be screened for this virus. You are considered at high risk for hepatitis B if: ? You were born in a country where hepatitis B is common. Ask your health care provider which countries are considered high risk. ? Your parents were born in a high-risk country, and you have not been immunized against hepatitis B (hepatitis B vaccine). ? You have HIV or AIDS. ? You use needles to inject street drugs. ? You live with someone who has hepatitis B. ? You have had sex with someone who has hepatitis B. ? You get hemodialysis treatment. ? You take certain medicines for conditions, including cancer, organ transplantation, and autoimmune conditions. Hepatitis C  Blood testing is recommended for: ? Everyone born from [birth year] through 1964. ? Anyone with known risk factors for hepatitis C. Sexually transmitted infections (STIs)  You should be screened for sexually transmitted infections (STIs) including gonorrhea and chlamydia if: ? You are sexually active and are younger than 72 years of age. ? You are older than 72 years of age and your health care provider tells you that you are at risk for this type of infection. ? Your sexual activity has changed since you were last screened and you are at an increased risk for chlamydia or gonorrhea. Ask your health care provider if you are at  risk.  If you do not have HIV, but are at risk, it may be recommended that you take a prescription medicine daily to prevent HIV infection. This is called pre-exposure prophylaxis (PrEP). You are considered at risk if: ? You are sexually active and do not regularly use condoms or know the HIV status of your partner(s). ? You take drugs by injection. ? You are sexually active with a partner who has HIV. Talk with your health care provider  about whether you are at high risk of being infected with HIV. If you choose to begin PrEP, you should first be tested for HIV. You should then be tested every 3 months for as long as you are taking PrEP. Pregnancy  If you are premenopausal and you may become pregnant, ask your health care provider about preconception counseling.  If you may become pregnant, take 400 to 800 micrograms (mcg) of folic acid every day.  If you want to prevent pregnancy, talk to your health care provider about birth control (contraception). Osteoporosis and menopause  Osteoporosis is a disease in which the bones lose minerals and strength with aging. This can result in serious bone fractures. Your risk for osteoporosis can be identified using a bone density scan.  If you are 81 years of age or older, or if you are at risk for osteoporosis and fractures, ask your health care provider if you should be screened.  Ask your health care provider whether you should take a calcium or vitamin D supplement to lower your risk for osteoporosis.  Menopause may have certain physical symptoms and risks.  Hormone replacement therapy may reduce some of these symptoms and risks. Talk to your health care provider about whether hormone replacement therapy is right for you. Follow these instructions at home:  Schedule regular health, dental, and eye exams.  Stay current with your immunizations.  Do not use any tobacco products including cigarettes, chewing tobacco, or electronic  cigarettes.  If you are pregnant, do not drink alcohol.  If you are breastfeeding, limit how much and how often you drink alcohol.  Limit alcohol intake to no more than 1 drink per day for nonpregnant women. One drink equals 12 ounces of beer, 5 ounces of wine, or 1 ounces of hard liquor.  Do not use street drugs.  Do not share needles.  Ask your health care provider for help if you need support or information about quitting drugs.  Tell your health care provider if you often feel depressed.  Tell your health care provider if you have ever been abused or do not feel safe at home. This information is not intended to replace advice given to you by your health care provider. Make sure you discuss any questions you have with your health care provider. Document Released: 10/31/2010 Document Revised: 09/23/2015 Document Reviewed: 01/19/2015 Elsevier Interactive Patient Education  2019 [employer].

## 2018-09-19 NOTE — Assessment & Plan Note (Signed)
Compliant with medications, no adverse side effects, repeat fasting lipid panel and CMP today

## 2018-09-19 NOTE — Assessment & Plan Note (Signed)
Taking metformin 500 mg daily, not monitoring CBG's, has history of very well-controlled mild diabetes will recheck A1c and CMP today

## 2018-09-19 NOTE — Progress Notes (Signed)
Patient ID: Heather Gross, female    DOB: 29-Oct-1945, 73 y.o.   MRN: [id number]  PCP: Delsa Grana, PA-C  Chief Complaint  Patient presents with  . Hypertension    follow up    Subjective:   Heather Gross is a 73 y.o. female, presents to clinic with CC of HTN, HLD, CKD, DM, GERD and other chronic conditions.  She has regular 3 months OV to monitor and recheck.  Recently transitioned care to me from other PA/PCP who left the practice.  HTN -patient continues to take her blood pressure medications with excellent compliance she does denies any side effects, is taking Norvasc 5mg  and lisinopril 10 mg daily.  HLD, patient is on simvastatin 40 mg daily, states she is taking as directed, she denies any side effects, denies myalgias.  She is fasting and due for labs today   DM - on metformin 500 mg once daily, continues to take daily reports excellent compliance, does not check her blood sugars very often.  Continues to have a strict diet and exercise regularly.  Patient states her acid reflux is unchanged, currently not causing any problems for her.  She believes she has some light spots around her hairline and she points to her left temple and forehead she states that she has had a condition before where she was given shampoo to use 2 times a week she previously saw a dermatologist for this.  No rash patches, alopecia or itching, no other areas on her body with anything similar.  She additionally is concerned about a sore area in her gums of her lower central incisors that she believes is from eating oranges a couple times a day she stopped eating them a few days ago is getting a little bit better but she is been unable to wear her dentures because it rubs against those gums, she does not have a history of aphthous ulcers.  Not tried anything for it but has been careful when brushing her teeth, has been avoiding certain foods and has not been wearing her lower partial dentures.   Patient  Active Problem List   Diagnosis Date Noted  . CKD (chronic kidney disease) stage 3, GFR 30-59 ml/min (HCC) 02/05/2017  . Osteopenia 11/02/2016  . Postmenopausal 11/02/2016  . Estrogen deficiency 11/02/2016  . Vitamin D deficiency 12/10/2014  . Hyperlipidemia 06/11/2014  . Diabetes mellitus without complication ([place])   . Glaucoma   . THROMBOCYTOPENIA 06/28/2006  . HYPERTENSION, BENIGN SYSTEMIC 06/28/2006  . GASTROESOPHAGEAL REFLUX, NO ESOPHAGITIS 06/28/2006  . ECZEMA, ATOPIC DERMATITIS 06/28/2006    Current Meds  Medication Sig  . amLODipine (NORVASC) 5 MG tablet TAKE 1 TABLET BY MOUTH EVERY DAY  . ASPIRIN ADULT LOW STRENGTH 81 MG EC tablet TAKE 1 TABLET BY MOUTH EVERY DAY  . Calcium 500 MG CHEW Chew 2 tablets (1,000 mg total) by mouth daily.  . [facility] D3 2000 units CAPS TAKE 1 CAPSULE BY MOUTH EVERY DAY  . doxycycline (VIBRA-TABS) 100 MG tablet Take 1 tablet (100 mg total) by mouth 2 (two) times daily.  Marland Kitchen lisinopril (PRINIVIL,ZESTRIL) 10 MG tablet TAKE 1 TABLET BY MOUTH EVERY DAY  . metFORMIN (GLUCOPHAGE) 500 MG tablet TAKE 1 TABLET BY MOUTH EVERY DAY  . Multiple Vitamin (MULTIVITAMIN) tablet Take 1 tablet by mouth daily.  Marland Kitchen omeprazole (PRILOSEC) 20 MG capsule TAKE 1 CAPSULE BY MOUTH EVERY DAY  . simvastatin (ZOCOR) 40 MG tablet TAKE 1 TABLET BY MOUTH EVERYDAY AT BEDTIME  Review of Systems  Constitutional: Negative.   HENT: Negative.   Eyes: Negative.   Respiratory: Negative.   Cardiovascular: Negative.   Gastrointestinal: Negative.   Endocrine: Negative.   Genitourinary: Negative.   Musculoskeletal: Negative.   Skin: Negative.   Allergic/Immunologic: Negative.   Neurological: Negative.   Hematological: Negative.   Psychiatric/Behavioral: Negative.   All other systems reviewed and are negative.      Objective:    Vitals:   09/19/18 0908  BP: 136/72  Pulse: 82  Resp: 18  Temp: 98.4 F (36.9 C)  SpO2: 96%  Weight: 215 lb (97.5 kg)  Height: 5\' 5"  (1.651 m)       Physical Exam Vitals signs and nursing note reviewed.  Constitutional:      General: She is not in acute distress.    Appearance: Normal appearance. She is well-developed. She is obese. She is not ill-appearing, toxic-appearing or diaphoretic.     Comments: Pleasant elderly well appearing female  HENT:     Head: Normocephalic and atraumatic.     Right Ear: External ear normal.     Left Ear: External ear normal.     Nose: Nose normal. No congestion.     Mouth/Throat:     Mouth: Mucous membranes are moist.     Dentition: Gum lesions present.     Pharynx: Oropharynx is clear. Uvula midline.     Comments: Very small gum lesion with about 1 cm x 2 to 3 mm wide white lesion on the gums below left central and lateral lower incisors, lesion has surrounding erythema but not swollen and no ulceration otherwise good dentition except for extracted teeth, good gum health throughout Eyes:     General: Lids are normal. No scleral icterus.    Conjunctiva/sclera: Conjunctivae normal.     Pupils: Pupils are equal, round, and reactive to light.     Comments: No conjunctival pallor  Neck:     Musculoskeletal: Normal range of motion and neck supple.     Trachea: Phonation normal. No tracheal deviation.  Cardiovascular:     Rate and Rhythm: Normal rate and regular rhythm.     Pulses: Normal pulses.          Radial pulses are 2+ on the right side and 2+ on the left side.       Posterior tibial pulses are 2+ on the right side and 2+ on the left side.     Heart sounds: Normal heart sounds. No murmur. No friction rub. No gallop.   Pulmonary:     Effort: Pulmonary effort is normal. No respiratory distress.     Breath sounds: Normal breath sounds. No stridor. No wheezing, rhonchi or rales.  Chest:     Chest wall: No tenderness.  Abdominal:     General: Bowel sounds are normal. There is no distension.     Palpations: Abdomen is soft.     Tenderness: There is no abdominal tenderness. There is no  guarding or rebound.  Musculoskeletal: Normal range of motion.        General: No deformity.     Right lower leg: No edema.     Left lower leg: No edema.  Lymphadenopathy:     Cervical: No cervical adenopathy.  Skin:    General: Skin is warm and dry.     Capillary Refill: Capillary refill takes less than 2 seconds.     Coloration: Skin is not pale.     Findings: No rash.  Neurological:  Mental Status: She is alert and oriented to person, place, and time.     Motor: No abnormal muscle tone.     Gait: Gait normal.  Psychiatric:        Mood and Affect: Mood normal.        Speech: Speech normal.        Behavior: Behavior normal.           Assessment & Plan:   73 year old AAF here for routine f/up on multiple chronic conditions, due for fasting labs, also has acute CC of rash/hypopigmentation to scalp and painful lesion in mouth on gums x 3d  Problem List Items Addressed This Visit      Cardiovascular and Mediastinum   HYPERTENSION, BENIGN SYSTEMIC - Primary    Blood pressure today 136/72, near goal, no side effects of medication, notes continued excellent compliance, recheck renal function      Relevant Orders   COMPLETE METABOLIC PANEL WITH GFR     Digestive   GASTROESOPHAGEAL REFLUX, NO ESOPHAGITIS    No change, well controlled, PPI PRN        Endocrine   Diabetes mellitus without complication (HCC)    Taking metformin 500 mg daily, not monitoring CBG's, has history of very well-controlled mild diabetes will recheck A1c and CMP today      Relevant Orders   COMPLETE METABOLIC PANEL WITH GFR   Lipid panel   Hemoglobin A1c   Ambulatory referral to Ophthalmology     Genitourinary   CKD (chronic kidney disease) stage 3, GFR 30-59 ml/min (HCC)   Relevant Orders   COMPLETE METABOLIC PANEL WITH GFR     Other   Hyperlipidemia    Compliant with medications, no adverse side effects, repeat fasting lipid panel and CMP today      Relevant Orders   COMPLETE  METABOLIC PANEL WITH GFR   Lipid panel    Other Visit Diagnoses    Skin hypopigmentation       Patient concerned with hypopigmentation around scalp had shampoo before patient given ketoconazole shampoo   Relevant Medications   ketoconazole (NIZORAL) 2 % shampoo   Mouth lesion       Avoid acidic or salty foods, keep partial dentures out for a few more days, use over-the-counter mouthwash follow-up if not improving      Greater than 50% of this visit was spent in direct face-to-face counseling, obtaining history and physical, discussing and educating pt on treatment plan.  Total time of this visit was 25 min.  Remainder of time involved but was not limited to reviewing chart (recent and pertinent OV notes and labs), documentation in EMR, and coordinating care and treatment plan.      Delsa Grana, PA-C 09/19/18 9:12 AM

## 2018-09-19 NOTE — Assessment & Plan Note (Signed)
No change, well controlled, PPI PRN

## 2018-09-20 ENCOUNTER — Encounter: Payer: Self-pay | Admitting: Family Medicine

## 2018-09-20 ENCOUNTER — Other Ambulatory Visit: Payer: Self-pay | Admitting: Family Medicine

## 2018-09-20 DIAGNOSIS — I10 Essential (primary) hypertension: Secondary | ICD-10-CM

## 2018-09-20 LAB — COMPLETE METABOLIC PANEL WITH GFR
AG Ratio: 1.5 (calc) (ref 1.0–2.5)
ALT: 11 U/L (ref 6–29)
AST: 17 U/L (ref 10–35)
Albumin: 4.4 g/dL (ref 3.6–5.1)
Alkaline phosphatase (APISO): 66 U/L (ref 37–153)
BUN/Creatinine Ratio: 15 (calc) (ref 6–22)
BUN: 17 mg/dL (ref 7–25)
CO2: 25 mmol/L (ref 20–32)
Calcium: 9.9 mg/dL (ref 8.6–10.4)
Chloride: 107 mmol/L (ref 98–110)
Creat: 1.13 mg/dL — ABNORMAL HIGH (ref 0.60–0.93)
GFR, Est African American: 56 mL/min/{1.73_m2} — ABNORMAL LOW (ref >=60)
GFR, Est Non African American: 49 mL/min/{1.73_m2} — ABNORMAL LOW (ref >=60)
Globulin: 2.9 g/dL (calc) (ref 1.9–3.7)
Glucose, Bld: 137 mg/dL — ABNORMAL HIGH (ref 65–99)
Potassium: 4.9 mmol/L (ref 3.5–5.3)
Sodium: 141 mmol/L (ref 135–146)
Total Bilirubin: 0.4 mg/dL (ref 0.2–1.2)
Total Protein: 7.3 g/dL (ref 6.1–8.1)

## 2018-09-20 LAB — HEMOGLOBIN A1C
Hgb A1c MFr Bld: 6.6 % of total Hgb — ABNORMAL HIGH (ref <5.7)
Mean Plasma Glucose: 143 (calc)
eAG (mmol/L): 7.9 (calc)

## 2018-09-20 LAB — LIPID PANEL
Cholesterol: 172 mg/dL (ref <200)
HDL: 39 mg/dL — ABNORMAL LOW (ref >=50)
LDL Cholesterol (Calc): 104 mg/dL (calc) — ABNORMAL HIGH
Non-HDL Cholesterol (Calc): 133 mg/dL (calc) — ABNORMAL HIGH (ref <130)
Total CHOL/HDL Ratio: 4.4 (calc) (ref <5.0)
Triglycerides: 169 mg/dL — ABNORMAL HIGH (ref <150)

## 2018-09-20 MED ORDER — METFORMIN HCL 500 MG PO TABS: 500.0000 mg | ORAL_TABLET | Freq: Every day | ORAL | 3 refills | Status: DC

## 2018-09-20 MED ORDER — SIMVASTATIN 40 MG PO TABS: ORAL_TABLET | ORAL | 3 refills | Status: DC

## 2018-09-20 MED ORDER — LISINOPRIL 10 MG PO TABS: 10.0000 mg | ORAL_TABLET | Freq: Every day | ORAL | 3 refills | Status: DC

## 2018-11-18 ENCOUNTER — Other Ambulatory Visit: Payer: Self-pay | Admitting: Family Medicine

## 2018-11-18 DIAGNOSIS — Z1231 Encounter for screening mammogram for malignant neoplasm of breast: Secondary | ICD-10-CM

## 2018-11-25 ENCOUNTER — Other Ambulatory Visit: Payer: Self-pay | Admitting: Family Medicine

## 2018-11-25 DIAGNOSIS — K219 Gastro-esophageal reflux disease without esophagitis: Secondary | ICD-10-CM

## 2018-12-24 DIAGNOSIS — H43393 Other vitreous opacities, bilateral: Secondary | ICD-10-CM | POA: Diagnosis not present

## 2018-12-25 ENCOUNTER — Other Ambulatory Visit: Payer: Self-pay | Admitting: Family Medicine

## 2018-12-25 DIAGNOSIS — K219 Gastro-esophageal reflux disease without esophagitis: Secondary | ICD-10-CM

## 2018-12-25 DIAGNOSIS — I10 Essential (primary) hypertension: Secondary | ICD-10-CM

## 2018-12-25 MED ORDER — SIMVASTATIN 40 MG PO TABS: ORAL_TABLET | ORAL | 3 refills | Status: DC

## 2018-12-25 MED ORDER — METFORMIN HCL 500 MG PO TABS: 500.0000 mg | ORAL_TABLET | Freq: Every day | ORAL | 3 refills | Status: DC

## 2018-12-25 MED ORDER — LISINOPRIL 10 MG PO TABS: 10.0000 mg | ORAL_TABLET | Freq: Every day | ORAL | 3 refills | Status: DC

## 2018-12-25 MED ORDER — OMEPRAZOLE 20 MG PO CPDR: DELAYED_RELEASE_CAPSULE | ORAL | 3 refills | Status: DC

## 2018-12-25 MED ORDER — AMLODIPINE BESYLATE 5 MG PO TABS: 5.0000 mg | ORAL_TABLET | Freq: Every day | ORAL | 3 refills | Status: DC

## 2019-01-01 ENCOUNTER — Other Ambulatory Visit: Payer: Self-pay

## 2019-01-01 ENCOUNTER — Ambulatory Visit
Admission: RE | Admit: 2019-01-01 | Discharge: 2019-01-01 | Disposition: A | Discharge status: Home / Self Care | Payer: Medicare HMO | Source: Ambulatory Visit | Attending: Family Medicine | Admitting: Family Medicine

## 2019-01-01 DIAGNOSIS — Z1231 Encounter for screening mammogram for malignant neoplasm of breast: Secondary | ICD-10-CM

## 2019-01-01 IMAGING — MG MM DIGITAL SCREENING BILAT W/ TOMO W/ CAD
6 of 12 series · 6 of 36 positions shown · non-contrast
Comparison: Previous exam(s).

CLINICAL DATA: Screening.

EXAM:
DIGITAL SCREENING BILATERAL MAMMOGRAM WITH TOMO AND CAD

[L CC synth-2D]
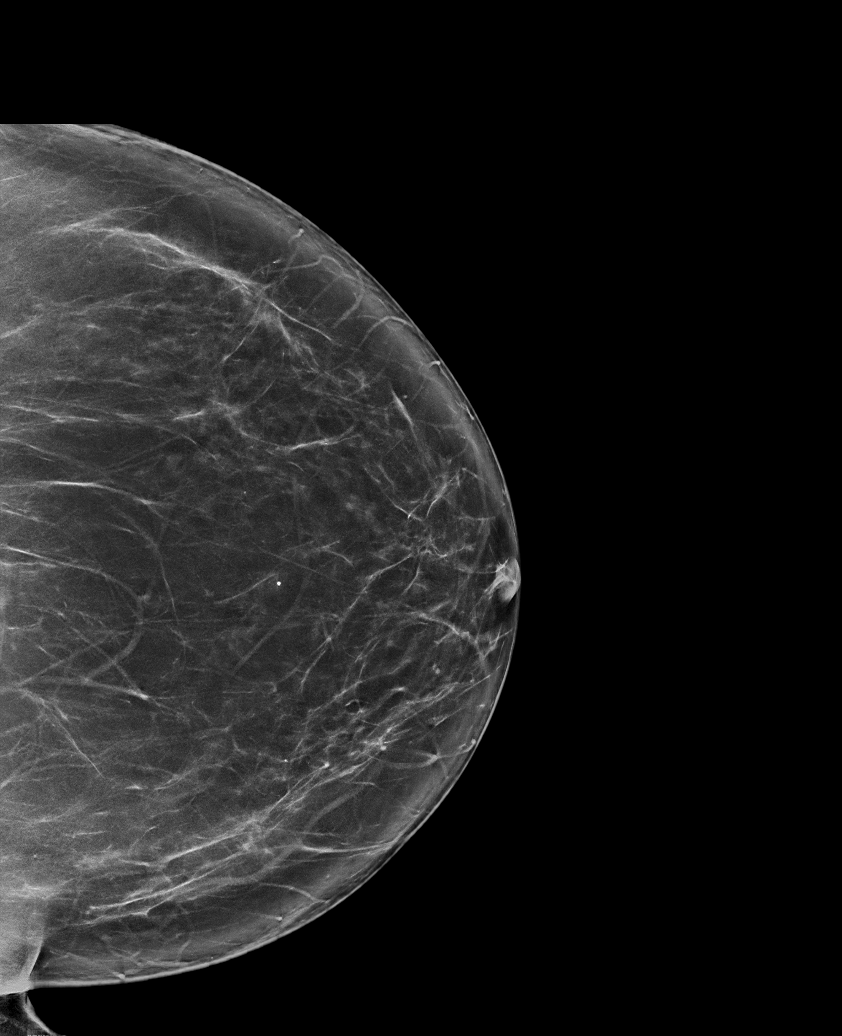

[R CC synth-2D (1 of 2)]
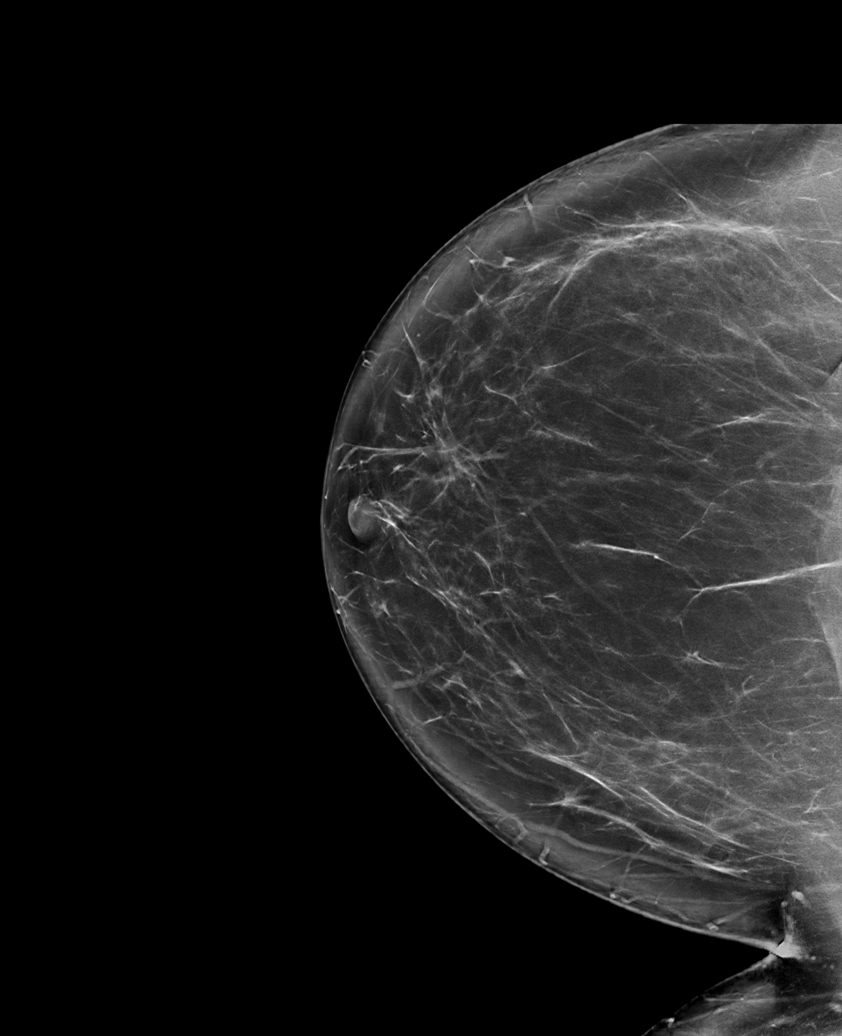

[L MLO synth-2D (1 of 2)]
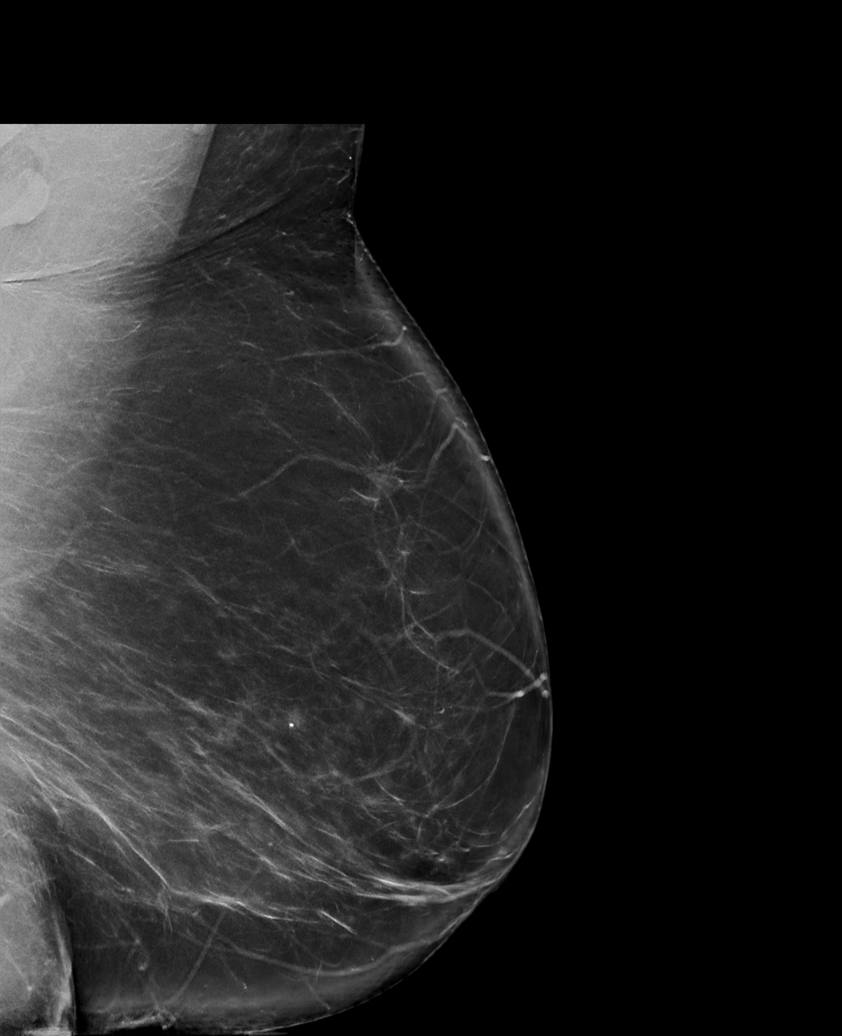

[L MLO synth-2D (2 of 2)]
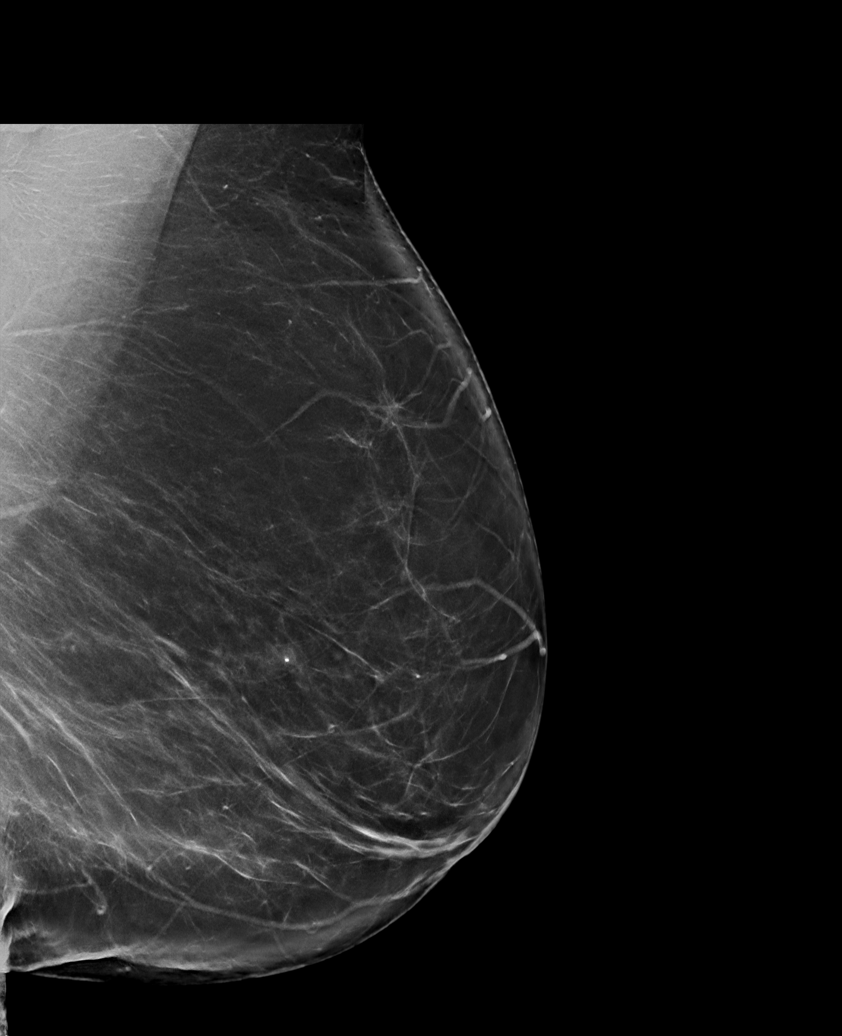

[R MLO synth-2D]
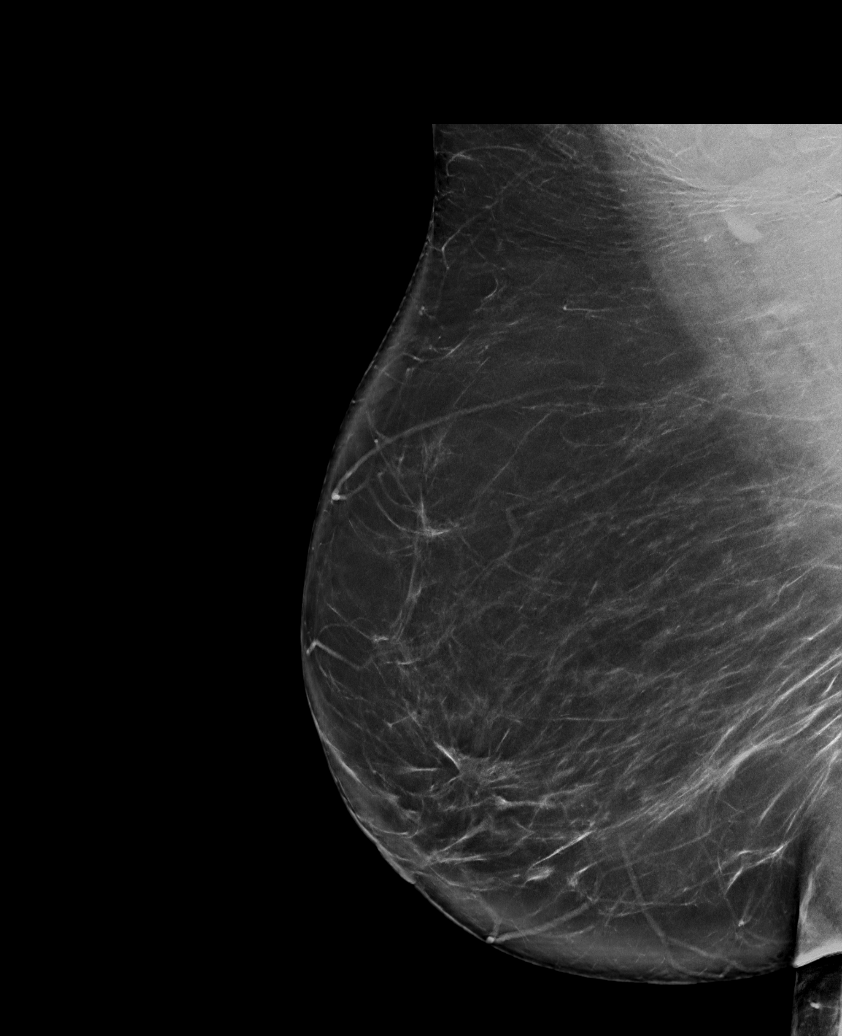

[R CC synth-2D (2 of 2)]
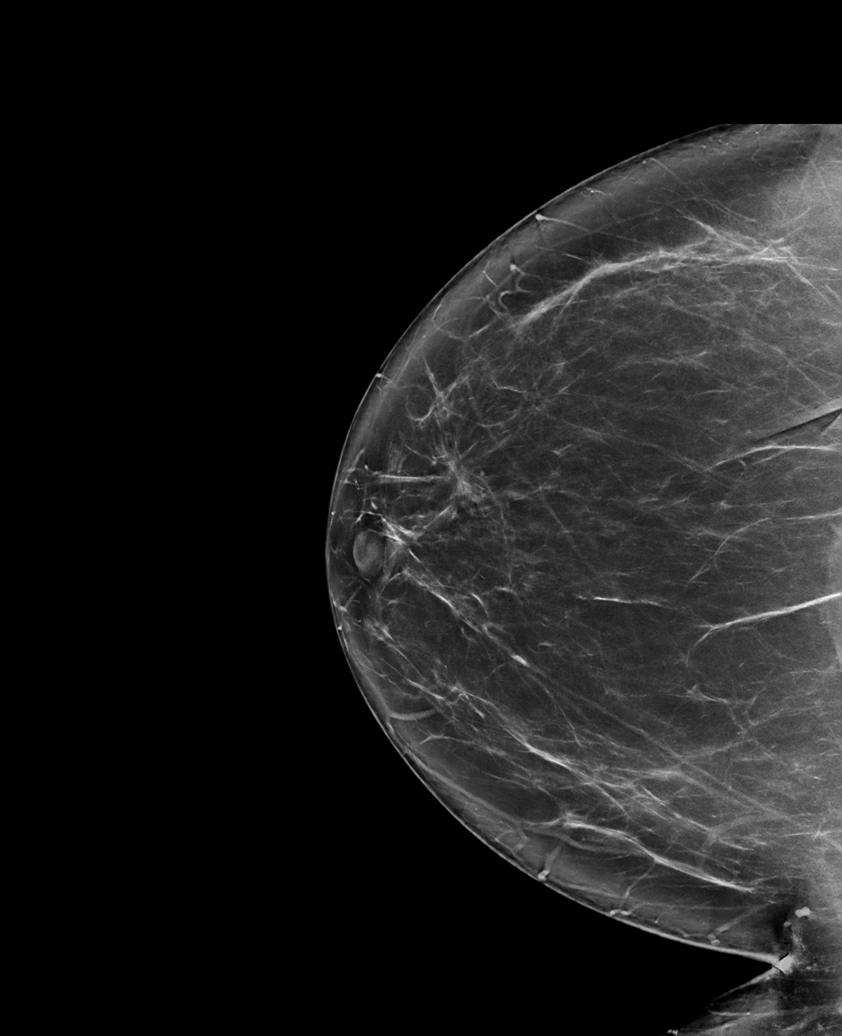

[6 of 36 positions shown; findings below may reference images not displayed]

ACR Breast Density Category b: There are scattered areas of
fibroglandular density.
FINDINGS: There are no findings suspicious for malignancy. Images were
processed with CAD.
IMPRESSION: No mammographic evidence of malignancy. A result letter of this
screening mammogram will be mailed directly to the patient.

RECOMMENDATION:
Screening mammogram in one year. (Code:[TQ])

BI-RADS CATEGORY  1: Negative.

## 2019-01-08 ENCOUNTER — Other Ambulatory Visit: Payer: Self-pay | Admitting: Family Medicine

## 2019-01-08 DIAGNOSIS — I10 Essential (primary) hypertension: Secondary | ICD-10-CM

## 2019-01-08 DIAGNOSIS — K219 Gastro-esophageal reflux disease without esophagitis: Secondary | ICD-10-CM

## 2019-01-08 MED ORDER — METFORMIN HCL 500 MG PO TABS: 500.0000 mg | ORAL_TABLET | Freq: Every day | ORAL | 3 refills | Status: DC

## 2019-01-08 MED ORDER — LISINOPRIL 10 MG PO TABS: 10.0000 mg | ORAL_TABLET | Freq: Every day | ORAL | 3 refills | Status: DC

## 2019-01-08 MED ORDER — AMLODIPINE BESYLATE 5 MG PO TABS: 5.0000 mg | ORAL_TABLET | Freq: Every day | ORAL | 3 refills | Status: DC

## 2019-01-08 MED ORDER — OMEPRAZOLE 20 MG PO CPDR: DELAYED_RELEASE_CAPSULE | ORAL | 3 refills | Status: DC

## 2019-01-08 MED ORDER — SIMVASTATIN 40 MG PO TABS: ORAL_TABLET | ORAL | 3 refills | Status: DC

## 2019-03-11 ENCOUNTER — Other Ambulatory Visit: Payer: Self-pay | Admitting: Family Medicine

## 2019-03-11 DIAGNOSIS — E119 Type 2 diabetes mellitus without complications: Secondary | ICD-10-CM

## 2019-03-17 DIAGNOSIS — H524 Presbyopia: Secondary | ICD-10-CM | POA: Diagnosis not present

## 2019-03-17 DIAGNOSIS — H52223 Regular astigmatism, bilateral: Secondary | ICD-10-CM | POA: Diagnosis not present

## 2019-05-06 ENCOUNTER — Ambulatory Visit (INDEPENDENT_AMBULATORY_CARE_PROVIDER_SITE_OTHER): Payer: Medicare HMO | Admitting: Family Medicine

## 2019-05-06 ENCOUNTER — Other Ambulatory Visit: Payer: Self-pay

## 2019-05-06 ENCOUNTER — Encounter: Payer: Self-pay | Admitting: Family Medicine

## 2019-05-06 VITALS — BP 132/76 | HR 68 | Temp 98.7°F | Resp 12 | Ht 65.0 in | Wt 217.0 lb

## 2019-05-06 DIAGNOSIS — H6121 Impacted cerumen, right ear: Secondary | ICD-10-CM | POA: Diagnosis not present

## 2019-05-06 DIAGNOSIS — E785 Hyperlipidemia, unspecified: Secondary | ICD-10-CM

## 2019-05-06 DIAGNOSIS — E119 Type 2 diabetes mellitus without complications: Secondary | ICD-10-CM | POA: Diagnosis not present

## 2019-05-06 DIAGNOSIS — I10 Essential (primary) hypertension: Secondary | ICD-10-CM

## 2019-05-06 DIAGNOSIS — Z23 Encounter for immunization: Secondary | ICD-10-CM | POA: Diagnosis not present

## 2019-05-06 DIAGNOSIS — M8580 Other specified disorders of bone density and structure, unspecified site: Secondary | ICD-10-CM | POA: Diagnosis not present

## 2019-05-06 NOTE — Assessment & Plan Note (Signed)
Continue calcium and vitamin D 

## 2019-05-06 NOTE — Progress Notes (Signed)
   Subjective:    Patient ID: Heather Gross, female    DOB: 08/04/1945, 74 y.o.   MRN: [id number]  Patient presents for R Ear Pain (x1 week- has throbbing and feels like ear is clogged- worse at night- no sinus issues) Patient to follow-up chronic medical problems and for acute visit  DM- last 6.6%, taking metformin 500mg  once a day, checks cbg Fasting a few times a week, no hypoglycemia    Patient here secondary to feeling like her right ear has been clogged up.  Sometimes hears her heartbeat in her ear but has not been sick recently no chest pain no congestion no dizziness no headache.  States that she had this problem before where she has had wax buildup  HTN- taking lisinopril and norvasc at bedtime, no history of heart attack or CAD/stroke  Hyperlipidemia- takes zocor at bedtime  GERD- takes prilosec as needed   Osteopenia- on calcium and vitamin D   GI- Dr. Collene Mares  [facility], no retinopathy, glaucoma but no current medications  Due for flu shot   Lives with husband has 2 local sons  Review Of Systems:  GEN- denies fatigue, fever, weight loss,weakness, recent illness HEENT- denies eye drainage, change in vision, nasal discharge, CVS- denies chest pain, palpitations RESP- denies SOB, cough, wheeze ABD- denies N/V, change in stools, abd pain GU- denies dysuria, hematuria, dribbling, incontinence MSK- denies joint pain, muscle aches, injury Neuro- denies headache, dizziness, syncope, seizure activity       Objective:    BP 132/76   Pulse 68   Temp 98.7 F (37.1 C) (Temporal)   Resp 12   Ht 5\' 5"  (1.651 m)   Wt 217 lb (98.4 kg)   SpO2 98%   BMI 36.11 kg/m  GEN- NAD, alert and oriented x3 HEENT- PERRL, EOMI, non injected sclera, pink conjunctiva, MMM, oropharynx clear, canal clear TM intact.  Right TM intact mild wax obscuring the canal no erythema hearing grossly intact Neck- Supple, no thyromegaly CVS- RRR, no  murmur RESP-CTAB ABD-NABS,soft,NT,ND EXT- No edema Pulses- Radial, DP- 2+        Assessment & Plan:      Problem List Items Addressed This Visit      Unprioritized   Diabetes mellitus without complication ([place])    Diabetes mellitus has been well controlled.  She does not check her blood sugars regularly.  Goal A1c less than 7% no change to current medications recheck her fasting labs today along with lipids.        Relevant Orders   Hemoglobin A1c   Lipid Panel   Microalbumin/Creatinine Ratio, Urine   HM Diabetes Foot Exam (Completed)   Hyperlipidemia   HYPERTENSION, BENIGN SYSTEMIC - Primary    Blood pressures well controlled no change in medications.      Relevant Orders   CBC with Differential   Comprehensive metabolic panel   Lipid Panel   Osteopenia    Continue calcium and vitamin D       Other Visit Diagnoses    Impacted cerumen of right ear       Status post irrigation mild wax canal no sign of infection,    Need for immunization against influenza       Relevant Orders   Flu Vaccine QUAD High Dose(Fluad) (Completed)      Note: This dictation was prepared with Dragon dictation along with smaller phrase technology. Any transcriptional errors that result from this process are unintentional.

## 2019-05-06 NOTE — Assessment & Plan Note (Signed)
Diabetes mellitus has been well controlled.  She does not check her blood sugars regularly.  Goal A1c less than 7% no change to current medications recheck her fasting labs today along with lipids.

## 2019-05-06 NOTE — Patient Instructions (Addendum)
F/U 4 months for Physical  Flu shot given You can also take claritin or zyrtec once a day for a week for ear popping

## 2019-05-06 NOTE — Assessment & Plan Note (Signed)
Blood pressures well controlled no change in medications.

## 2019-05-07 LAB — LIPID PANEL
Cholesterol: 169 mg/dL (ref <200)
HDL: 46 mg/dL — ABNORMAL LOW (ref >=50)
LDL Cholesterol (Calc): 95 mg/dL (calc)
Non-HDL Cholesterol (Calc): 123 mg/dL (calc) (ref <130)
Total CHOL/HDL Ratio: 3.7 (calc) (ref <5.0)
Triglycerides: 186 mg/dL — ABNORMAL HIGH (ref <150)

## 2019-05-07 LAB — HEMOGLOBIN A1C
Hgb A1c MFr Bld: 6.7 % of total Hgb — ABNORMAL HIGH (ref <5.7)
Mean Plasma Glucose: 146 (calc)
eAG (mmol/L): 8.1 (calc)

## 2019-05-07 LAB — CBC WITH DIFFERENTIAL/PLATELET
Absolute Monocytes: 460 cells/uL (ref 200–950)
Basophils Absolute: 51 cells/uL (ref 0–200)
Basophils Relative: 0.7 %
Eosinophils Absolute: 234 cells/uL (ref 15–500)
Eosinophils Relative: 3.2 %
HCT: 40 % (ref 35.0–45.0)
Hemoglobin: 13.3 g/dL (ref 11.7–15.5)
Lymphs Abs: 2489 cells/uL (ref 850–3900)
MCH: 30.9 pg (ref 27.0–33.0)
MCHC: 33.3 g/dL (ref 32.0–36.0)
MCV: 93 fL (ref 80.0–100.0)
MPV: 11.6 fL (ref 7.5–12.5)
Monocytes Relative: 6.3 %
Neutro Abs: 4066 cells/uL (ref 1500–7800)
Neutrophils Relative %: 55.7 %
Platelets: 199 10*3/uL (ref 140–400)
RBC: 4.3 10*6/uL (ref 3.80–5.10)
RDW: 13 % (ref 11.0–15.0)
Total Lymphocyte: 34.1 %
WBC: 7.3 10*3/uL (ref 3.8–10.8)

## 2019-05-07 LAB — COMPREHENSIVE METABOLIC PANEL
AG Ratio: 1.6 (calc) (ref 1.0–2.5)
ALT: 17 U/L (ref 6–29)
AST: 24 U/L (ref 10–35)
Albumin: 4.4 g/dL (ref 3.6–5.1)
Alkaline phosphatase (APISO): 72 U/L (ref 37–153)
BUN/Creatinine Ratio: 14 (calc) (ref 6–22)
BUN: 15 mg/dL (ref 7–25)
CO2: 24 mmol/L (ref 20–32)
Calcium: 10 mg/dL (ref 8.6–10.4)
Chloride: 106 mmol/L (ref 98–110)
Creat: 1.1 mg/dL — ABNORMAL HIGH (ref 0.60–0.93)
Globulin: 2.7 g/dL (calc) (ref 1.9–3.7)
Glucose, Bld: 143 mg/dL — ABNORMAL HIGH (ref 65–99)
Potassium: 5 mmol/L (ref 3.5–5.3)
Sodium: 141 mmol/L (ref 135–146)
Total Bilirubin: 0.5 mg/dL (ref 0.2–1.2)
Total Protein: 7.1 g/dL (ref 6.1–8.1)

## 2019-05-07 LAB — MICROALBUMIN / CREATININE URINE RATIO
Creatinine, Urine: 166 mg/dL (ref 20–275)
Microalb Creat Ratio: 5 mcg/mg creat (ref <30)
Microalb, Ur: 0.9 mg/dL

## 2019-05-08 ENCOUNTER — Encounter: Payer: Self-pay | Admitting: *Deleted

## 2019-05-22 ENCOUNTER — Ambulatory Visit: Payer: Medicare HMO

## 2019-05-23 ENCOUNTER — Ambulatory Visit: Payer: Medicare HMO | Attending: Internal Medicine

## 2019-05-23 DIAGNOSIS — Z23 Encounter for immunization: Secondary | ICD-10-CM

## 2019-05-23 NOTE — Progress Notes (Signed)
   Covid-19 Vaccination Clinic  Name:  Heather Gross    MRN: [id number] DOB: 10-27-1945  05/23/2019  [name] was observed post Covid-19 immunization for 15 minutes without incidence. She was provided with Vaccine Information Sheet and instruction to access the V-Safe system.   [name] was instructed to call 911 with any severe reactions post vaccine: Marland Kitchen Difficulty breathing  . Swelling of your face and throat  . A fast heartbeat  . A bad rash all over your body  . Dizziness and weakness    Immunizations Administered    Name Date Dose VIS Date Route   Pfizer COVID-19 Vaccine 05/23/2019  8:54 AM 0.3 mL 04/11/2019 Intramuscular   Manufacturer: [place]   Lot: [id number]   [place]: [id number]

## 2019-06-02 ENCOUNTER — Telehealth: Payer: Self-pay | Admitting: Family Medicine

## 2019-06-02 NOTE — Telephone Encounter (Signed)
Awaiting form

## 2019-06-02 NOTE — Telephone Encounter (Signed)
Patient brought in handicap placard requesting it to be filled out. I have placed in Christina's yellow folder. You can call her once it is ready for pick up.  CB# [phone]

## 2019-06-03 NOTE — Telephone Encounter (Signed)
Received form.   Completed and MD signed.   Call placed to patient and patient made aware to come to office to pick up.

## 2019-06-06 DIAGNOSIS — H6983 Other specified disorders of Eustachian tube, bilateral: Secondary | ICD-10-CM | POA: Diagnosis not present

## 2019-06-06 DIAGNOSIS — H903 Sensorineural hearing loss, bilateral: Secondary | ICD-10-CM | POA: Diagnosis not present

## 2019-06-13 ENCOUNTER — Ambulatory Visit: Payer: Medicare HMO | Attending: Internal Medicine

## 2019-06-13 DIAGNOSIS — Z23 Encounter for immunization: Secondary | ICD-10-CM | POA: Insufficient documentation

## 2019-06-13 NOTE — Progress Notes (Signed)
   Covid-19 Vaccination Clinic  Name:  Heather Gross    MRN: [id number] DOB: 03-10-1946  06/13/2019  Heather Gross was observed post Covid-19 immunization for 15 minutes without incidence. She was provided with Vaccine Information Sheet and instruction to access the V-Safe system.   Heather Gross was instructed to call 911 with any severe reactions post vaccine: Marland Kitchen Difficulty breathing  . Swelling of your face and throat  . A fast heartbeat  . A bad rash all over your body  . Dizziness and weakness    Immunizations Administered    Name Date Dose VIS Date Route   Pfizer COVID-19 Vaccine 06/13/2019  8:40 AM 0.3 mL 04/11/2019 Intramuscular   Manufacturer: [place]   Lot: [id number]   [place]: [id number]

## 2019-08-29 ENCOUNTER — Ambulatory Visit (INDEPENDENT_AMBULATORY_CARE_PROVIDER_SITE_OTHER): Payer: Medicare HMO | Admitting: Family Medicine

## 2019-08-29 ENCOUNTER — Encounter: Payer: Self-pay | Admitting: Family Medicine

## 2019-08-29 ENCOUNTER — Other Ambulatory Visit: Payer: Self-pay

## 2019-08-29 VITALS — BP 120/68 | HR 68 | Temp 98.2°F | Resp 14 | Ht 65.0 in | Wt 215.0 lb

## 2019-08-29 DIAGNOSIS — E559 Vitamin D deficiency, unspecified: Secondary | ICD-10-CM | POA: Diagnosis not present

## 2019-08-29 DIAGNOSIS — D485 Neoplasm of uncertain behavior of skin: Secondary | ICD-10-CM

## 2019-08-29 DIAGNOSIS — L820 Inflamed seborrheic keratosis: Secondary | ICD-10-CM | POA: Diagnosis not present

## 2019-08-29 DIAGNOSIS — Z0001 Encounter for general adult medical examination with abnormal findings: Secondary | ICD-10-CM | POA: Diagnosis not present

## 2019-08-29 DIAGNOSIS — N182 Chronic kidney disease, stage 2 (mild): Secondary | ICD-10-CM | POA: Diagnosis not present

## 2019-08-29 DIAGNOSIS — D489 Neoplasm of uncertain behavior, unspecified: Secondary | ICD-10-CM | POA: Diagnosis not present

## 2019-08-29 DIAGNOSIS — E119 Type 2 diabetes mellitus without complications: Secondary | ICD-10-CM

## 2019-08-29 DIAGNOSIS — E785 Hyperlipidemia, unspecified: Secondary | ICD-10-CM | POA: Diagnosis not present

## 2019-08-29 DIAGNOSIS — I10 Essential (primary) hypertension: Secondary | ICD-10-CM | POA: Diagnosis not present

## 2019-08-29 DIAGNOSIS — Z0000 Encounter for general adult medical examination without abnormal findings: Secondary | ICD-10-CM

## 2019-08-29 NOTE — Progress Notes (Signed)
Subjective:   Patient presents for Medicare Annual/Subsequent preventive examination.    Pt here for wellness visit   HTN- taking lisinopril and norvasc at bedtime, no history of heart attack or CAD/stroke  Hyperlipidemia- takes zocor at bedtime  GERD- takes prilosec as needed   Osteopenia- on calcium and vitamin D   GI- Dr. Collene Mares  [facility], no retinopathy, glaucoma but no current medications  DM- last A1C 6.7% in Jan, due for repeat, currently on metformin   CKD stage 2 due for recheck   She has noticed some puffiness over her left upper  over brow, no pain for a year or more, states her sister has the same thing,  no changes    Left upper arm has large mole present for years, concerned about the size     Review Past Medical/Family/Social: Per EMR    Risk Factors  Current exercise habits: walking three times a week  Dietary issues discussed:  Yes   Cardiac risk factors: HTN, Obesity, DM   Depression Screen  (Note: if answer to either of the following is "Yes", a more complete depression screening is indicated)  Over the past two weeks, have you felt down, depressed or hopeless? No Over the past two weeks, have you felt little interest or pleasure in doing things? No Have you lost interest or pleasure in daily life? No Do you often feel hopeless? No Do you cry easily over simple problems? No   Activities of Daily Living  In your present state of health, do you have any difficulty performing the following activities?:  Driving? No  Managing money? No  Feeding yourself? No  Getting from bed to chair? No  Climbing a flight of stairs? No  Preparing food and eating?: No  Bathing or showering? No  Getting dressed: No  Getting to the toilet? No  Using the toilet:No  Moving around from place to place: No  In the past year have you fallen or had a near fall?:No  Are you sexually active? No  Do you have more than one partner? No   Hearing  Difficulties: No  Do you often ask people to speak up or repeat themselves? No  Do you experience ringing or noises in your ears? No Do you have difficulty understanding soft or whispered voices? No  Do you feel that you have a problem with memory? No Do you often misplace items? No  Do you feel safe at home? Yes  Cognitive Testing  Alert? Yes Normal Appearance?Yes  Oriented to person? Yes Place? Yes  Time? Yes  Recall of three objects? Yes  Can perform simple calculations? Yes  Displays appropriate judgment?Yes  Can read the correct time from a watch face?Yes   List the Names of Other Physician/Practitioners you currently use:  [facility]  Dr. Melburn Hake any recent Medical Services you may have received from other than [facility] providers in the past year (date may be approximate).   Screening Tests / Date Colonoscopy    UTD                  Zostavax  UTD COVID-19 UTD PNA- UTD  Mammogram  UTD Bone Density- due in Sept with Mammogram  Influenza Vaccine  UTD Tetanus/tdap UTD  ROS: GEN- denies fatigue, fever, weight loss,weakness, recent illness HEENT- denies eye drainage, change in vision, nasal discharge, CVS- denies chest pain, palpitations RESP- denies SOB, cough, wheeze ABD- denies N/V, change in stools,  abd pain GU- denies dysuria, hematuria, dribbling, incontinence MSK- denies joint pain, muscle aches, injury Neuro- denies headache, dizziness, syncope, seizure activity   PHYSICAL: vitals reviewed  GEN- NAD, alert and oriented x3 HEENT- PERRL, EOMI, non injected sclera, pink conjunctiva, MMM, oropharynx clear, minimal swelling Left eyebrow, no cholesterol deposits  Neck- Supple, no thryomegaly CVS- RRR, no murmur RESP-CTAB ABD-NABS,soft,NT,ND Skin- left upper arm hyperpigmented raised nevus, some scaliness, NT , dime size  EXT- No edema Pulses- Radial, DP- 2+   Procedure- Shave Biopsy Mole  Procedure explained to patient questions answered benefits and  risks discussed verbal consent obtained. Antiseptic-betadine  Anesthesia-lidocaine 1% with epi Razor blade used to shave lesion in entirety  Minimal blood loss,drysol touched to lesion due to persistent oozing Patient tolerated procedure well Bandage applied with triple antibiotic ointment      Assessment:    Annual wellness medicare exam   Plan:    During the course of the visit the patient was educated and counseled about appropriate screening and preventive services including:  Screening mammography - Due in the fall  Osteopenia- repeat bone density with mammogram in the fall     - check vitamin D   Fall/depression/cage screen negative  Nevus of arm removed and pathology sent - aftercare discussed   Minimal swelling on brown- stable, no changes, no affect on vision, will continue monitoring   HTN- controlled   DM- recheck A1C goal less than 7%, continue metformin    Low dose ACE and ASA, statin drug   Diet review for nutrition referral? Yes ____ Not Indicated __x__  Patient Instructions (the written plan) was given to the patient.  Medicare Attestation  I have personally reviewed:  The patient's medical and social history  Their use of alcohol, tobacco or illicit drugs  Their current medications and supplements  The patient's functional ability including ADLs,fall risks, home safety risks, cognitive, and hearing and visual impairment  Diet and physical activities  Evidence for depression or mood disorders  The patient's weight, height, BMI, and visual acuity have been recorded in the chart. I have made referrals, counseling, and provided education to the patient based on review of the above and I have provided the patient with a written personalized care plan for preventive services.

## 2019-08-29 NOTE — Patient Instructions (Addendum)
F/u 4 MONTHS  Keep area clean, and use the ointment with bandaid once a day until healed

## 2019-08-30 LAB — VITAMIN D 25 HYDROXY (VIT D DEFICIENCY, FRACTURES): Vit D, 25-Hydroxy: 32 ng/mL (ref 30–100)

## 2019-08-30 LAB — COMPLETE METABOLIC PANEL WITH GFR
AG Ratio: 1.7 (calc) (ref 1.0–2.5)
ALT: 13 U/L (ref 6–29)
AST: 22 U/L (ref 10–35)
Albumin: 4.3 g/dL (ref 3.6–5.1)
Alkaline phosphatase (APISO): 68 U/L (ref 37–153)
BUN/Creatinine Ratio: 15 (calc) (ref 6–22)
BUN: 18 mg/dL (ref 7–25)
CO2: 26 mmol/L (ref 20–32)
Calcium: 9.8 mg/dL (ref 8.6–10.4)
Chloride: 107 mmol/L (ref 98–110)
Creat: 1.2 mg/dL — ABNORMAL HIGH (ref 0.60–0.93)
GFR, Est African American: 52 mL/min/{1.73_m2} — ABNORMAL LOW (ref >=60)
GFR, Est Non African American: 45 mL/min/{1.73_m2} — ABNORMAL LOW (ref >=60)
Globulin: 2.6 g/dL (calc) (ref 1.9–3.7)
Glucose, Bld: 141 mg/dL — ABNORMAL HIGH (ref 65–99)
Potassium: 4.9 mmol/L (ref 3.5–5.3)
Sodium: 141 mmol/L (ref 135–146)
Total Bilirubin: 0.6 mg/dL (ref 0.2–1.2)
Total Protein: 6.9 g/dL (ref 6.1–8.1)

## 2019-08-30 LAB — CBC WITH DIFFERENTIAL/PLATELET
Absolute Monocytes: 511 cells/uL (ref 200–950)
Basophils Absolute: 43 cells/uL (ref 0–200)
Basophils Relative: 0.6 %
Eosinophils Absolute: 213 cells/uL (ref 15–500)
Eosinophils Relative: 3 %
HCT: 38.9 % (ref 35.0–45.0)
Hemoglobin: 13.1 g/dL (ref 11.7–15.5)
Lymphs Abs: 2655 cells/uL (ref 850–3900)
MCH: 31.4 pg (ref 27.0–33.0)
MCHC: 33.7 g/dL (ref 32.0–36.0)
MCV: 93.3 fL (ref 80.0–100.0)
MPV: 11.7 fL (ref 7.5–12.5)
Monocytes Relative: 7.2 %
Neutro Abs: 3678 cells/uL (ref 1500–7800)
Neutrophils Relative %: 51.8 %
Platelets: 203 10*3/uL (ref 140–400)
RBC: 4.17 10*6/uL (ref 3.80–5.10)
RDW: 13.2 % (ref 11.0–15.0)
Total Lymphocyte: 37.4 %
WBC: 7.1 10*3/uL (ref 3.8–10.8)

## 2019-08-30 LAB — LIPID PANEL
Cholesterol: 197 mg/dL (ref <200)
HDL: 41 mg/dL — ABNORMAL LOW (ref >=50)
LDL Cholesterol (Calc): 130 mg/dL (calc) — ABNORMAL HIGH
Non-HDL Cholesterol (Calc): 156 mg/dL (calc) — ABNORMAL HIGH (ref <130)
Total CHOL/HDL Ratio: 4.8 (calc) (ref <5.0)
Triglycerides: 138 mg/dL (ref <150)

## 2019-08-30 LAB — HEMOGLOBIN A1C
Hgb A1c MFr Bld: 6.8 % of total Hgb — ABNORMAL HIGH (ref <5.7)
Mean Plasma Glucose: 148 (calc)
eAG (mmol/L): 8.2 (calc)

## 2019-08-30 LAB — TSH: TSH: 1.74 mIU/L (ref 0.40–4.50)

## 2019-08-31 ENCOUNTER — Encounter: Payer: Self-pay | Admitting: Family Medicine

## 2019-09-01 LAB — PATHOLOGY REPORT

## 2019-09-01 LAB — TISSUE SPECIMEN

## 2019-09-02 ENCOUNTER — Other Ambulatory Visit: Payer: Self-pay | Admitting: Family Medicine

## 2019-09-02 MED ORDER — SIMVASTATIN 80 MG PO TABS
ORAL_TABLET | ORAL | 1 refills | Status: DC
Start: 2019-09-02 — End: 2020-03-02

## 2019-09-11 ENCOUNTER — Telehealth: Payer: Self-pay | Admitting: Family Medicine

## 2019-09-11 NOTE — Chronic Care Management (AMB) (Signed)
  Chronic Care Management   Note  09/11/2019 Name: Kason Carn MRN: [id number] DOB: Jul 26, 1945  Kechia Groulx is a 74 y.o. year old female who is a primary care patient of Reynolds, Modena Nunnery, MD. I reached out to Elson Areas by phone today in response to a referral sent by Ms. Valari Pare's PCP, Buelah Manis, Modena Nunnery, MD.   Ms. Ivanov was given information about Chronic Care Management services today including:  1. CCM service includes personalized support from designated clinical staff supervised by her physician, including individualized plan of care and coordination with other care providers 2. 24/7 contact phone numbers for assistance for urgent and routine care needs. 3. Service will only be billed when office clinical staff spend 20 minutes or more in a month to coordinate care. 4. Only one practitioner may furnish and bill the service in a calendar month. 5. The patient may stop CCM services at any time (effective at the end of the month) by phone call to the office staff.   Patient agreed to services and verbal consent obtained.   Follow up plan:   [place]

## 2019-10-23 ENCOUNTER — Other Ambulatory Visit: Payer: Self-pay | Admitting: *Deleted

## 2019-10-23 DIAGNOSIS — K219 Gastro-esophageal reflux disease without esophagitis: Secondary | ICD-10-CM

## 2019-10-23 DIAGNOSIS — M8580 Other specified disorders of bone density and structure, unspecified site: Secondary | ICD-10-CM

## 2019-10-23 DIAGNOSIS — E119 Type 2 diabetes mellitus without complications: Secondary | ICD-10-CM

## 2019-10-23 DIAGNOSIS — I10 Essential (primary) hypertension: Secondary | ICD-10-CM

## 2019-10-23 DIAGNOSIS — E785 Hyperlipidemia, unspecified: Secondary | ICD-10-CM

## 2019-10-23 DIAGNOSIS — E559 Vitamin D deficiency, unspecified: Secondary | ICD-10-CM

## 2019-10-23 NOTE — Chronic Care Management (AMB) (Signed)
 Chronic Care Management Pharmacy  Name: Heather Gross  MRN: [id number] DOB: 05/26/1945  Chief Complaint/ HPI  Heather Gross,  74 y.o. , female presents for their Initial CCM visit with the clinical pharmacist In office.  PCP : [place], Kawanta F, MD  Their chronic conditions include: hypertension, GERD, diabetes, CKD, hyperlipidemia, Vitamin D deficiency, osteopenia.  Office Visits:  08/29/2019 ([place]) - annual medicare visit, taking amlodipine hs, no concerns patient on appropriate medications  05/06/2019 ([place]) - R ear pain, felt like it had been popping, can take claritin or zyrtec for ear, no other changes noted  Consult Visit:none recent  Medications: Outpatient Encounter Medications as of 10/24/2019  Medication Sig  . amLODipine (NORVASC) 5 MG tablet Take 1 tablet (5 mg total) by mouth daily.  . ASPIRIN LOW DOSE 81 MG EC tablet TAKE 1 TABLET BY MOUTH EVERY DAY  . Calcium 500 MG CHEW Chew 2 tablets (1,000 mg total) by mouth daily.  . [facility] D3 2000 units CAPS TAKE 1 CAPSULE BY MOUTH EVERY DAY  . fluticasone (FLONASE) 50 MCG/ACT nasal spray   . lisinopril (ZESTRIL) 10 MG tablet Take 1 tablet (10 mg total) by mouth daily.  . metFORMIN (GLUCOPHAGE) 500 MG tablet Take 1 tablet (500 mg total) by mouth at bedtime.  . Multiple Vitamin (MULTIVITAMIN) tablet Take 1 tablet by mouth daily.  . omeprazole (PRILOSEC) 20 MG capsule TAKE 1 CAPSULE BY MOUTH EVERY DAY  . simvastatin (ZOCOR) 80 MG tablet Take 1 tablet by mouth at bedtime  . ketoconazole (NIZORAL) 2 % shampoo Apply 1 application topically 2 (two) times daily as needed for irritation (scalp patches/rash/hypopigmentation). (Patient not taking: Reported on 10/24/2019)   No facility-administered encounter medications on file as of 10/24/2019.     Current Diagnosis/Assessment:    Financial Resource Strain: Low Risk   . Difficulty of Paying Living Expenses: Not very hard     Goals Addressed            This Visit's  Progress   . Pharmacy Care Plan:       CARE PLAN ENTRY (see longitudinal plan of care for additional care plan information)  Current Barriers:  . Chronic Disease Management support, education, and care coordination needs related to Hypertension, Hyperlipidemia, and Diabetes   Hypertension BP Readings from Last 3 Encounters:  08/29/19 120/68  05/06/19 132/76  09/19/18 136/72   . Pharmacist Clinical Goal(s): o Over the next 180 days, patient will work with PharmD and providers to maintain BP goal <140/90 . Current regimen:  o Amlodipine 5mg o Lisinopril10mg . Interventions: o Recommended home BP monitoring o Recommended switch lisinopril to night time dosing . Patient self care activities - Over the next 180 days, patient will: o Check BP periodically, document, and provide at future appointments o Ensure daily salt intake < 2300 mg/day  Hyperlipidemia Lab Results  Component Value Date/Time   LDLCALC 130 (H) 08/29/2019 09:11 AM   LDLDIRECT 88.9 08/13/2006 02:12 PM   . Pharmacist Clinical Goal(s): o Over the next 180 days, patient will work with PharmD and providers to achieve LDL goal < 100 . Current regimen:  o Simvastatin 80mg . Interventions: o Recommended taking simvastatin at night time o Counseled on importance of statin medications . Patient self care activities - Over the next 180 days, patient will: o Begin to take simvastatin at bedtime o Focus on medication adherence by pill count  Diabetes Lab Results  Component Value Date/Time   HGBA1C 6.8 (H) 08/29/2019 09:11   AM   HGBA1C 6.7 (H) 05/06/2019 08:42 AM   HGBA1C 5.8 (H) 11/02/2016 08:44 AM   HGBA1C 6.5 (H) 04/06/2016 08:18 AM   . Pharmacist Clinical Goal(s): o Over the next 180 days, patient will work with PharmD and providers to maintain A1c goal <7% . Current regimen:  o Metformin 500mg daily . Interventions: o Recommended home blood sugar monitoring with ReliOn meter o Counseled on goals for fasting  and 2-hr post-prandial glucose . Patient self care activities - Over the next 180 days, patient will: o Check blood sugar once daily, document, and provide at future appointments o Contact provider with any episodes of hypoglycemia o Aim for goal of 70-130 mg/dl fasting blood sugar in the mornings  Initial goal documentation        Diabetes   Recent Relevant Labs: Lab Results  Component Value Date/Time   HGBA1C 6.8 (H) 08/29/2019 09:11 AM   HGBA1C 6.7 (H) 05/06/2019 08:42 AM   HGBA1C 5.8 (H) 11/02/2016 08:44 AM   HGBA1C 6.5 (H) 04/06/2016 08:18 AM   MICROALBUR 0.9 05/06/2019 08:49 AM   MICROALBUR 0.5 02/05/2017 09:52 AM     Checking BG: Never   Patient has failed these meds in past: none noted Patient is currently controlled on the following medications: metformin 500mg daily  Last diabetic Foot exam: No results found for: HMDIABEYEEXA  Last diabetic Eye exam: No results found for: HMDIABFOOTEX   We discussed: patient not checking blood sugar currently, diet consists of limited fried foods, she watches her serving size of juices and knows carbohydrates are a source of sugar.  Encouraged regular monitoring as A1c looks to be trending up.  Discussed goals for fasting blood sugar and 2-hour post prandial  Plan  Continue current medications.  Recommend Relion meter and checking fasting blood sugar at least once daily in the mornings. Hyperlipidemia   LDL goal < 100  Lipid Panel     Component Value Date/Time   CHOL 197 08/29/2019 0911   TRIG 138 08/29/2019 0911   HDL 41 (L) 08/29/2019 0911   LDLCALC 130 (H) 08/29/2019 0911   LDLDIRECT 88.9 08/13/2006 1412    Hepatic Function Latest Ref Rng & Units 08/29/2019 05/06/2019 09/19/2018  Total Protein 6.1 - 8.1 g/dL 6.9 7.1 7.3  Albumin 3.6 - 5.1 g/dL - - -  AST 10 - 35 U/L 22 24 17  ALT 6 - 29 U/L 13 17 11  Alk Phosphatase 33 - 130 U/L - - -  Total Bilirubin 0.2 - 1.2 mg/dL 0.6 0.5 0.4  Bilirubin, Direct 0.0 - 0.3 mg/dL -  - -     The 10-year ASCVD risk score (Goff DC Jr., et al., 2013) is: 25.3%   Values used to calculate the score:     Age: 74 years     Sex: Female     Is Non-Hispanic African American: Yes     Diabetic: Yes     Tobacco smoker: No     Systolic Blood Pressure: 120 mmHg     Is BP treated: Yes     HDL Cholesterol: 41 mg/dL     Total Cholesterol: 197 mg/dL   Patient has failed these meds in past: none noted Patient is currently uncontrolled on the following medications:  . Simvastatin 80mg  We discussed:  Patient currently takes in the morning, simvastatin is short acting statin, LDL elevated at last lipid panel.  Plan  Continue current medications, switch dosing to night time.  Hypertension      Office blood pressures are  BP Readings from Last 3 Encounters:  08/29/19 120/68  05/06/19 132/76  09/19/18 136/72    Patient has failed these meds in the past: HCTZ 12.5mg  Patient checks BP at home rarely  Patient home BP readings are ranging: N/A  Patient is currently controlled on the following medications: amlodipine 5mg, lisinopril 10mg  We discussed - she has monitor at home but it has not worked in over a year.  Wants to go get a new meter so that she can monitor BP.  Patient stated she takes both blood pressure medications in the morning.  Plan  Continue current medications, take lisinopril at night for more full day coverage. Begin self monitoring and logging of blood pressure periodically.     GERD    Patient has failed these meds in past: none noted Patient is currently controlled on the following medications: omeprazole 20mg  We discussed:  Patient does not take daily, only when needed, symptoms of acid reflux come and go  Plan  Continue current medications  CKD   Kidney Function Lab Results  Component Value Date/Time   CREATININE 1.20 (H) 08/29/2019 09:11 AM   CREATININE 1.10 (H) 05/06/2019 08:42 AM   GFRNONAA 45 (L) 08/29/2019 09:11 AM   GFRAA 52  (L) 08/29/2019 09:11 AM   K 4.9 08/29/2019 09:11 AM   K 5.0 05/06/2019 08:42 AM    Evaluated all medications for safety with current renal function.  Plan  Continue current medications . Osteopenia   Last DEXA Scan: 2018  T-Score femoral neck: -1.3   T-Score forearm radius: -0.1  10-year probability of major osteoporotic fracture: 4%  10-year probability of hip fracture: 0.5%  Vit D, 25-Hydroxy  Date Value Ref Range Status  08/29/2019 32 30 - 100 ng/mL Final    Comment:    Vitamin D Status         25-OH Vitamin D: . Deficiency:                    <20 ng/mL Insufficiency:             20 - 29 ng/mL Optimal:                 > or = 30 ng/mL . For 25-OH Vitamin D testing on patients on  D2-supplementation and patients for whom quantitation  of D2 and D3 fractions is required, the QuestAssureD(TM) 25-OH VIT D, (D2,D3), LC/MS/MS is recommended: order  code 92888 (patients >2yrs). See Note 1 . Note 1 . For additional information, please refer to  http://education.Quest[facility].com/faq/FAQ199  (This link is being provided for informational/ educational purposes only.)      Patient is not a candidate for pharmacologic treatment  Patient has failed these meds in past: none noted Patient is currently controlled on the following medications:  . Calcium, 1,000mg daily . Vitamin D 2000 IU daily  We discussed:  Recommend 800-1000 units of vitamin D daily. Recommend 1200 mg of calcium daily from dietary and supplemental sources.  Plan  Continue current medications  Vaccines   Reviewed and discussed patient's vaccination history.    Immunization History  Administered Date(s) Administered  . Fluad Quad(high Dose 65+) 05/06/2019  . Influenza, High Dose Seasonal PF 02/05/2017  . Influenza,inj,Quad PF,6+ Mos 06/11/2014, 02/24/2015, 04/06/2016, 03/01/2018  . PFIZER SARS-COV-2 Vaccination 05/23/2019, 06/13/2019  . Pneumococcal Conjugate-13 06/11/2014  . Pneumococcal  Polysaccharide-23 12/10/2014  . Tdap 06/11/2014  . Zoster 12/17/2015      Plan  Recommended patient receive shingles vaccine in office/pharmacy.   Medication Management   . Miscellaneous medications: Flonase 50mcg prn allergies . OTC's: ASA 81mg, MVI . [facility]  Phone #  [phone] . Patient reports using vials in tray method to organize medications and promote adherence. . Patient denies missed doses of medication.   Christian Davis, PharmD Clinical Pharmacist [facility] [phone]   

## 2019-10-24 ENCOUNTER — Other Ambulatory Visit: Payer: Self-pay

## 2019-10-24 ENCOUNTER — Ambulatory Visit: Payer: Medicare HMO | Admitting: Pharmacist

## 2019-10-24 DIAGNOSIS — E785 Hyperlipidemia, unspecified: Secondary | ICD-10-CM

## 2019-10-24 DIAGNOSIS — I10 Essential (primary) hypertension: Secondary | ICD-10-CM

## 2019-10-24 DIAGNOSIS — E119 Type 2 diabetes mellitus without complications: Secondary | ICD-10-CM

## 2019-10-24 NOTE — Patient Instructions (Addendum)
Visit Information Thank you for meeting with me today!  I look forward to working with you to help you meet all of your healthcare goals and answer any questions you may have.  Feel free to contact me anytime!  Goals Addressed            This Visit's Progress   . Pharmacy Care Plan:       CARE PLAN ENTRY (see longitudinal plan of care for additional care plan information)  Current Barriers:  . Chronic Disease Management support, education, and care coordination needs related to Hypertension, Hyperlipidemia, and Diabetes   Hypertension BP Readings from Last 3 Encounters:  08/29/19 120/68  05/06/19 132/76  09/19/18 136/72   . Pharmacist Clinical Goal(s): o Over the next 180 days, patient will work with PharmD and providers to maintain BP goal <140/90 . Current regimen:  o Amlodipine 5mg  o Lisinopril10mg  . Interventions: o Recommended home BP monitoring o Recommended switch lisinopril to night time dosing . Patient self care activities - Over the next 180 days, patient will: o Check BP periodically, document, and provide at future appointments o Ensure daily salt intake < 2300 mg/day  Hyperlipidemia Lab Results  Component Value Date/Time   LDLCALC 130 (H) 08/29/2019 09:11 AM   LDLDIRECT 88.9 08/13/2006 02:12 PM   . Pharmacist Clinical Goal(s): o Over the next 180 days, patient will work with PharmD and providers to achieve LDL goal < 100 . Current regimen:  o Simvastatin 80mg  . Interventions: o Recommended taking simvastatin at night time o Counseled on importance of statin medications . Patient self care activities - Over the next 180 days, patient will: o Begin to take simvastatin at bedtime o Focus on medication adherence by pill count  Diabetes Lab Results  Component Value Date/Time   HGBA1C 6.8 (H) 08/29/2019 09:11 AM   HGBA1C 6.7 (H) 05/06/2019 08:42 AM   HGBA1C 5.8 (H) 11/02/2016 08:44 AM   HGBA1C 6.5 (H) 04/06/2016 08:18 AM   . Pharmacist Clinical  Goal(s): o Over the next 180 days, patient will work with PharmD and providers to maintain A1c goal <7% . Current regimen:  o Metformin 500mg  daily . Interventions: o Recommended home blood sugar monitoring with ReliOn meter o Counseled on goals for fasting and 2-hr post-prandial glucose . Patient self care activities - Over the next 180 days, patient will: o Check blood sugar once daily, document, and provide at future appointments o Contact provider with any episodes of hypoglycemia o Aim for goal of 70-130 mg/dl fasting blood sugar in the mornings  Initial goal documentation        Ms. Ambroise was given information about Chronic Care Management services today including:  1. CCM service includes personalized support from designated clinical staff supervised by her physician, including individualized plan of care and coordination with other care providers 2. 24/7 contact phone numbers for assistance for urgent and routine care needs. 3. Standard insurance, coinsurance, copays and deductibles apply for chronic care management only during months in which we provide at least 20 minutes of these services. Most insurances cover these services at 100%, however patients may be responsible for any copay, coinsurance and/or deductible if applicable. This service may help you avoid the need for more expensive face-to-face services. 4. Only one practitioner may furnish and bill the service in a calendar month. 5. The patient may stop CCM services at any time (effective at the end of the month) by phone call to the office staff.  Patient agreed  to services and verbal consent obtained.   The patient verbalized understanding of instructions provided today and agreed to receive a mailed copy of patient instruction and/or educational materials. Telephone follow up appointment with pharmacy team member scheduled for: 55 months  Kamali Sakata, PharmD Clinical Pharmacist [facility] [phone]   Diabetes Mellitus and Nutrition, Adult When you have diabetes (diabetes mellitus), it is very important to have healthy eating habits because your blood sugar (glucose) levels are greatly affected by what you eat and drink. Eating healthy foods in the appropriate amounts, at about the same times every day, can help you:  Control your blood glucose.  Lower your risk of heart disease.  Improve your blood pressure.  Reach or maintain a healthy weight. Every person with diabetes is different, and each person has different needs for a meal plan. Your health care provider may recommend that you work with a diet and nutrition specialist (dietitian) to make a meal plan that is best for you. Your meal plan may vary depending on factors such as:  The calories you need.  The medicines you take.  Your weight.  Your blood glucose, blood pressure, and cholesterol levels.  Your activity level.  Other health conditions you have, such as heart or kidney disease. How do carbohydrates affect me? Carbohydrates, also called carbs, affect your blood glucose level more than any other type of food. Eating carbs naturally raises the amount of glucose in your blood. Carb counting is a method for keeping track of how many carbs you eat. Counting carbs is important to keep your blood glucose at a healthy level, especially if you use insulin or take certain oral diabetes medicines. It is important to know how many carbs you can safely have in each meal. This is different for every person. Your dietitian can help you calculate how many carbs you should have at each meal and for each snack. Foods that contain carbs include:  Bread, cereal, rice, pasta, and crackers.  Potatoes and corn.  Peas, beans, and lentils.  Milk and yogurt.  Fruit and juice.  Desserts, such as cakes, cookies, ice cream, and candy. How does alcohol affect me? Alcohol can cause a sudden decrease in blood  glucose (hypoglycemia), especially if you use insulin or take certain oral diabetes medicines. Hypoglycemia can be a life-threatening condition. Symptoms of hypoglycemia (sleepiness, dizziness, and confusion) are similar to symptoms of having too much alcohol. If your health care provider says that alcohol is safe for you, follow these guidelines:  Limit alcohol intake to no more than 1 drink per day for nonpregnant women and 2 drinks per day for men. One drink equals 12 oz of beer, 5 oz of wine, or 1 oz of hard liquor.  Do not drink on an empty stomach.  Keep yourself hydrated with water, diet soda, or unsweetened iced tea.  Keep in mind that regular soda, juice, and other mixers may contain a lot of sugar and must be counted as carbs. What are tips for following this plan?  Reading food labels  Start by checking the serving size on the "Nutrition Facts" label of packaged foods and drinks. The amount of calories, carbs, fats, and other nutrients listed on the label is based on one serving of the item. Many items contain more than one serving per package.  Check the total grams (g) of carbs in one serving. You can calculate the number of servings of carbs in one serving by dividing  the total carbs by 15. For example, if a food has 30 g of total carbs, it would be equal to 2 servings of carbs.  Check the number of grams (g) of saturated and trans fats in one serving. Choose foods that have low or no amount of these fats.  Check the number of milligrams (mg) of salt (sodium) in one serving. Most people should limit total sodium intake to less than 2,300 mg per day.  Always check the nutrition information of foods labeled as "low-fat" or "nonfat". These foods may be higher in added sugar or refined carbs and should be avoided.  Talk to your dietitian to identify your daily goals for nutrients listed on the label. Shopping  Avoid buying canned, premade, or processed foods. These foods tend to  be high in fat, sodium, and added sugar.  Shop around the outside edge of the grocery store. This includes fresh fruits and vegetables, bulk grains, fresh meats, and fresh dairy. Cooking  Use low-heat cooking methods, such as baking, instead of high-heat cooking methods like deep frying.  Cook using healthy oils, such as olive, canola, or sunflower oil.  Avoid cooking with butter, cream, or high-fat meats. Meal planning  Eat meals and snacks regularly, preferably at the same times every day. Avoid going long periods of time without eating.  Eat foods high in fiber, such as fresh fruits, vegetables, beans, and whole grains. Talk to your dietitian about how many servings of carbs you can eat at each meal.  Eat 4-6 ounces (oz) of lean protein each day, such as lean meat, chicken, fish, eggs, or tofu. One oz of lean protein is equal to: ? 1 oz of meat, chicken, or fish. ? 1 egg. ?  cup of tofu.  Eat some foods each day that contain healthy fats, such as avocado, nuts, seeds, and fish. Lifestyle  Check your blood glucose regularly.  Exercise regularly as told by your health care provider. This may include: ? 150 minutes of moderate-intensity or vigorous-intensity exercise each week. This could be brisk walking, biking, or water aerobics. ? Stretching and doing strength exercises, such as yoga or weightlifting, at least 2 times a week.  Take medicines as told by your health care provider.  Do not use any products that contain nicotine or tobacco, such as cigarettes and e-cigarettes. If you need help quitting, ask your health care provider.  Work with a Social worker or diabetes educator to identify strategies to manage stress and any emotional and social challenges. Questions to ask a health care provider  Do I need to meet with a diabetes educator?  Do I need to meet with a dietitian?  What number can I call if I have questions?  When are the best times to check my blood  glucose? Where to find more information:  American Diabetes Association: diabetes.org  Academy of Nutrition and Dietetics: www.eatright.[employer] of Diabetes and Digestive and Kidney Diseases (NIH): [employer] Summary  A healthy meal plan will help you control your blood glucose and maintain a healthy lifestyle.  Working with a diet and nutrition specialist (dietitian) can help you make a meal plan that is best for you.  Keep in mind that carbohydrates (carbs) and alcohol have immediate effects on your blood glucose levels. It is important to count carbs and to use alcohol carefully. This information is not intended to replace advice given to you by your health care provider. Make sure you discuss any questions you have with  your health care provider. Document Revised: 03/30/2017 Document Reviewed: 05/22/2016 Elsevier Patient Education  2020 [employer].

## 2019-12-12 DIAGNOSIS — I10 Essential (primary) hypertension: Secondary | ICD-10-CM | POA: Diagnosis not present

## 2019-12-12 DIAGNOSIS — L259 Unspecified contact dermatitis, unspecified cause: Secondary | ICD-10-CM | POA: Diagnosis not present

## 2019-12-12 DIAGNOSIS — Z91040 Latex allergy status: Secondary | ICD-10-CM | POA: Diagnosis not present

## 2019-12-14 ENCOUNTER — Other Ambulatory Visit: Payer: Self-pay | Admitting: Family Medicine

## 2019-12-14 DIAGNOSIS — I10 Essential (primary) hypertension: Secondary | ICD-10-CM

## 2019-12-14 DIAGNOSIS — K219 Gastro-esophageal reflux disease without esophagitis: Secondary | ICD-10-CM

## 2019-12-15 DIAGNOSIS — L301 Dyshidrosis [pompholyx]: Secondary | ICD-10-CM | POA: Diagnosis not present

## 2019-12-15 DIAGNOSIS — L0109 Other impetigo: Secondary | ICD-10-CM | POA: Diagnosis not present

## 2019-12-15 DIAGNOSIS — L0889 Other specified local infections of the skin and subcutaneous tissue: Secondary | ICD-10-CM | POA: Diagnosis not present

## 2019-12-19 ENCOUNTER — Other Ambulatory Visit: Payer: Self-pay | Admitting: Family Medicine

## 2019-12-30 ENCOUNTER — Other Ambulatory Visit: Payer: Self-pay

## 2019-12-30 ENCOUNTER — Encounter: Payer: Self-pay | Admitting: Family Medicine

## 2019-12-30 ENCOUNTER — Ambulatory Visit (INDEPENDENT_AMBULATORY_CARE_PROVIDER_SITE_OTHER): Payer: Medicare HMO | Admitting: Family Medicine

## 2019-12-30 VITALS — BP 138/82 | HR 70 | Temp 98.3°F | Resp 14 | Ht 65.0 in | Wt 205.0 lb

## 2019-12-30 DIAGNOSIS — E669 Obesity, unspecified: Secondary | ICD-10-CM | POA: Diagnosis not present

## 2019-12-30 DIAGNOSIS — M8580 Other specified disorders of bone density and structure, unspecified site: Secondary | ICD-10-CM | POA: Diagnosis not present

## 2019-12-30 DIAGNOSIS — Z1231 Encounter for screening mammogram for malignant neoplasm of breast: Secondary | ICD-10-CM

## 2019-12-30 DIAGNOSIS — N182 Chronic kidney disease, stage 2 (mild): Secondary | ICD-10-CM | POA: Diagnosis not present

## 2019-12-30 DIAGNOSIS — E1122 Type 2 diabetes mellitus with diabetic chronic kidney disease: Secondary | ICD-10-CM

## 2019-12-30 DIAGNOSIS — I10 Essential (primary) hypertension: Secondary | ICD-10-CM

## 2019-12-30 DIAGNOSIS — E785 Hyperlipidemia, unspecified: Secondary | ICD-10-CM

## 2019-12-30 NOTE — Assessment & Plan Note (Signed)
A1C goal < 7% Continue metformin Recheck renal function  Pt to schedule eye exam contiue ACEI and ASA 81mg  Statin drug for lipids, goal LDL less than 100

## 2019-12-30 NOTE — Patient Instructions (Signed)
We will call with lab results Schedule your mammogram and Bone Density  F/U 4 months

## 2019-12-30 NOTE — Assessment & Plan Note (Signed)
Controlled no changes 

## 2019-12-30 NOTE — Assessment & Plan Note (Signed)
Continue calcium and vitamin D Schedule bone density and mammo

## 2019-12-30 NOTE — Progress Notes (Signed)
   Subjective:    Patient ID: Heather Gross, female    DOB: May 03, 1945, 74 y.o.   MRN: [id number]  Patient presents for Follow-up (is fasting) Patient here to follow-up chronic medical problems.  Medications reviewed.  Diabetes mellitus last A1c 6.8% in April.  CBGs at home range Taking Metformin 500mg  once a day as prescribed.  No hypoglycemia symptoms  HTN- taking blood pressure medicines as prescribed no side effects from the meds.  Hyperlipidemia she is on statin drug  CKD- on ACEI   Seen at  [facility] for rash on her hands about 2 weeks, diagnosed with contact dermatitis , prescribed hydrocortisone 2.5% that didn't help seen by dermatolgist Dr. Wilhemina Bonito, given antibiotics and a different cream, staph infection in hands   Weight down 10lbs since August , she has cut back, when she had hand rash did not eat as much   Due for Mammogram next month   Osteopenia- due for bone density   Review Of Systems:  GEN- denies fatigue, fever, weight loss,weakness, recent illness HEENT- denies eye drainage, change in vision, nasal discharge, CVS- denies chest pain, palpitations RESP- denies SOB, cough, wheeze ABD- denies N/V, change in stools, abd pain GU- denies dysuria, hematuria, dribbling, incontinence MSK- denies joint pain, muscle aches, injury Neuro- denies headache, dizziness, syncope, seizure activity       Objective:    BP 138/82   Pulse 70   Temp 98.3 F (36.8 C) (Temporal)   Resp 14   Ht 5\' 5"  (1.651 m)   Wt 205 lb (93 kg)   SpO2 96%   BMI 34.11 kg/m  GEN- NAD, alert and oriented x3 HEENT- PERRL, EOMI, non injected sclera, pink conjunctiva, MMM, oropharynx clear Neck- Supple, no thyromegaly CVS- RRR, no murmur RESP-CTAB ABD-NABS,soft,NT,ND Skin- peeling skin on bilat palms, no blisters, no erythema  EXT- No edema Pulses- Radial, DP- 2+        Assessment & Plan:      Problem List Items Addressed This Visit      Unprioritized   Chronic kidney disease (CKD),  stage II (mild)   Relevant Orders   COMPLETE METABOLIC PANEL WITH GFR   Class 1 obesity   Hyperlipidemia   Relevant Orders   Lipid panel   HYPERTENSION, BENIGN SYSTEMIC - Primary    Controlled no changes       Relevant Orders   COMPLETE METABOLIC PANEL WITH GFR   CBC with Differential/Platelet   Osteopenia    Continue calcium and vitamin D Schedule bone density and mammo      Relevant Orders   DG Bone Density   Type 2 diabetes mellitus with diabetic chronic kidney disease (HCC)    A1C goal < 7% Continue metformin Recheck renal function  Pt to schedule eye exam contiue ACEI and ASA 81mg  Statin drug for lipids, goal LDL less than 100      Relevant Orders   Hemoglobin A1c   Lipid panel   CBC with Differential/Platelet    Other Visit Diagnoses    Encounter for screening mammogram for malignant neoplasm of breast       Relevant Orders   MM 3D SCREEN BREAST BILATERAL      Note: This dictation was prepared with Dragon dictation along with smaller phrase technology. Any transcriptional errors that result from this process are unintentional.

## 2019-12-31 LAB — CBC WITH DIFFERENTIAL/PLATELET
Absolute Monocytes: 371 cells/uL (ref 200–950)
Basophils Absolute: 91 cells/uL (ref 0–200)
Basophils Relative: 1.4 %
Eosinophils Absolute: 293 cells/uL (ref 15–500)
Eosinophils Relative: 4.5 %
HCT: 38.1 % (ref 35.0–45.0)
Hemoglobin: 12.6 g/dL (ref 11.7–15.5)
Lymphs Abs: 2243 cells/uL (ref 850–3900)
MCH: 30.6 pg (ref 27.0–33.0)
MCHC: 33.1 g/dL (ref 32.0–36.0)
MCV: 92.5 fL (ref 80.0–100.0)
MPV: 11.8 fL (ref 7.5–12.5)
Monocytes Relative: 5.7 %
Neutro Abs: 3504 cells/uL (ref 1500–7800)
Neutrophils Relative %: 53.9 %
Platelets: 187 10*3/uL (ref 140–400)
RBC: 4.12 10*6/uL (ref 3.80–5.10)
RDW: 12.7 % (ref 11.0–15.0)
Total Lymphocyte: 34.5 %
WBC: 6.5 10*3/uL (ref 3.8–10.8)

## 2019-12-31 LAB — LIPID PANEL
Cholesterol: 154 mg/dL (ref <200)
HDL: 39 mg/dL — ABNORMAL LOW (ref >=50)
LDL Cholesterol (Calc): 93 mg/dL (calc)
Non-HDL Cholesterol (Calc): 115 mg/dL (calc) (ref <130)
Total CHOL/HDL Ratio: 3.9 (calc) (ref <5.0)
Triglycerides: 128 mg/dL (ref <150)

## 2019-12-31 LAB — HEMOGLOBIN A1C
Hgb A1c MFr Bld: 6.6 % of total Hgb — ABNORMAL HIGH (ref <5.7)
Mean Plasma Glucose: 143 (calc)
eAG (mmol/L): 7.9 (calc)

## 2019-12-31 LAB — COMPLETE METABOLIC PANEL WITH GFR
AG Ratio: 1.6 (calc) (ref 1.0–2.5)
ALT: 10 U/L (ref 6–29)
AST: 18 U/L (ref 10–35)
Albumin: 4.1 g/dL (ref 3.6–5.1)
Alkaline phosphatase (APISO): 64 U/L (ref 37–153)
BUN/Creatinine Ratio: 13 (calc) (ref 6–22)
BUN: 14 mg/dL (ref 7–25)
CO2: 22 mmol/L (ref 20–32)
Calcium: 9.3 mg/dL (ref 8.6–10.4)
Chloride: 109 mmol/L (ref 98–110)
Creat: 1.12 mg/dL — ABNORMAL HIGH (ref 0.60–0.93)
GFR, Est African American: 56 mL/min/{1.73_m2} — ABNORMAL LOW (ref >=60)
GFR, Est Non African American: 49 mL/min/{1.73_m2} — ABNORMAL LOW (ref >=60)
Globulin: 2.6 g/dL (calc) (ref 1.9–3.7)
Glucose, Bld: 129 mg/dL — ABNORMAL HIGH (ref 65–99)
Potassium: 3.9 mmol/L (ref 3.5–5.3)
Sodium: 141 mmol/L (ref 135–146)
Total Bilirubin: 0.4 mg/dL (ref 0.2–1.2)
Total Protein: 6.7 g/dL (ref 6.1–8.1)

## 2020-01-01 ENCOUNTER — Encounter: Payer: Self-pay | Admitting: *Deleted

## 2020-02-04 ENCOUNTER — Ambulatory Visit
Admission: RE | Admit: 2020-02-04 | Discharge: 2020-02-04 | Disposition: A | Discharge status: Home / Self Care | Payer: Medicare HMO | Source: Ambulatory Visit | Attending: Family Medicine | Admitting: Family Medicine

## 2020-02-04 ENCOUNTER — Other Ambulatory Visit: Payer: Self-pay

## 2020-02-04 DIAGNOSIS — M8580 Other specified disorders of bone density and structure, unspecified site: Secondary | ICD-10-CM

## 2020-02-04 DIAGNOSIS — M85852 Other specified disorders of bone density and structure, left thigh: Secondary | ICD-10-CM | POA: Diagnosis not present

## 2020-02-04 DIAGNOSIS — Z1231 Encounter for screening mammogram for malignant neoplasm of breast: Secondary | ICD-10-CM

## 2020-02-04 DIAGNOSIS — Z780 Asymptomatic menopausal state: Secondary | ICD-10-CM | POA: Diagnosis not present

## 2020-02-06 ENCOUNTER — Encounter: Payer: Self-pay | Admitting: *Deleted

## 2020-02-28 ENCOUNTER — Other Ambulatory Visit: Payer: Self-pay | Admitting: Family Medicine

## 2020-03-09 ENCOUNTER — Ambulatory Visit
Admission: RE | Admit: 2020-03-09 | Discharge: 2020-03-09 | Disposition: A | Discharge status: Home / Self Care | Payer: Medicare HMO | Source: Ambulatory Visit | Attending: Family Medicine | Admitting: Family Medicine

## 2020-03-09 ENCOUNTER — Other Ambulatory Visit: Payer: Self-pay

## 2020-03-09 DIAGNOSIS — Z1231 Encounter for screening mammogram for malignant neoplasm of breast: Secondary | ICD-10-CM | POA: Diagnosis not present

## 2020-03-09 IMAGING — MG DIGITAL SCREENING BILAT W/ TOMO W/ CAD
8 series · 8 of 24 positions shown · non-contrast
Comparison: Previous exam(s).

CLINICAL DATA: Screening.

EXAM:
DIGITAL SCREENING BILATERAL MAMMOGRAM WITH TOMO AND CAD

[L MLO synth-2D]
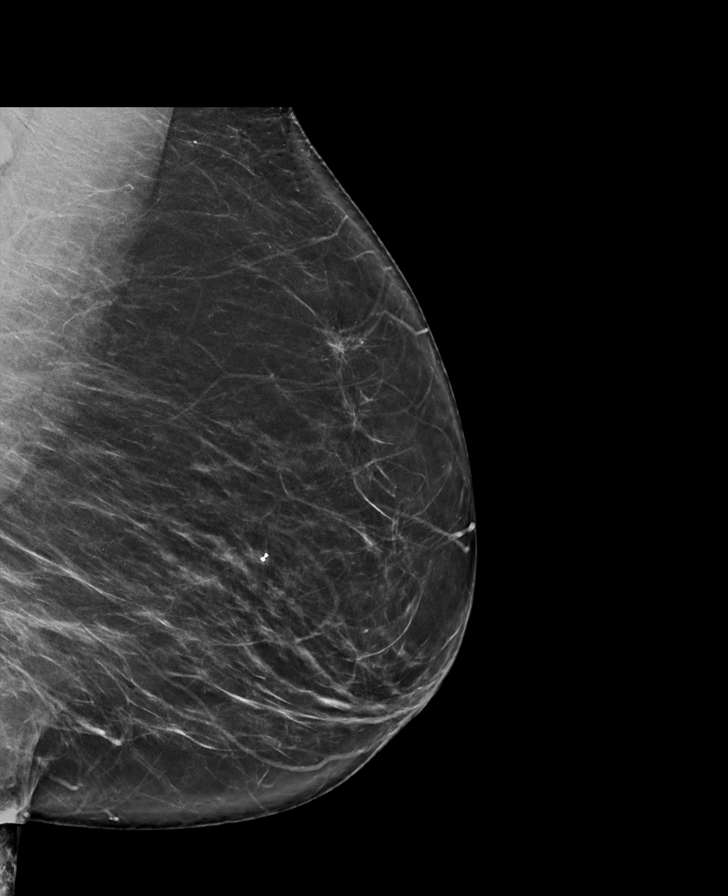

[R MLO synth-2D]
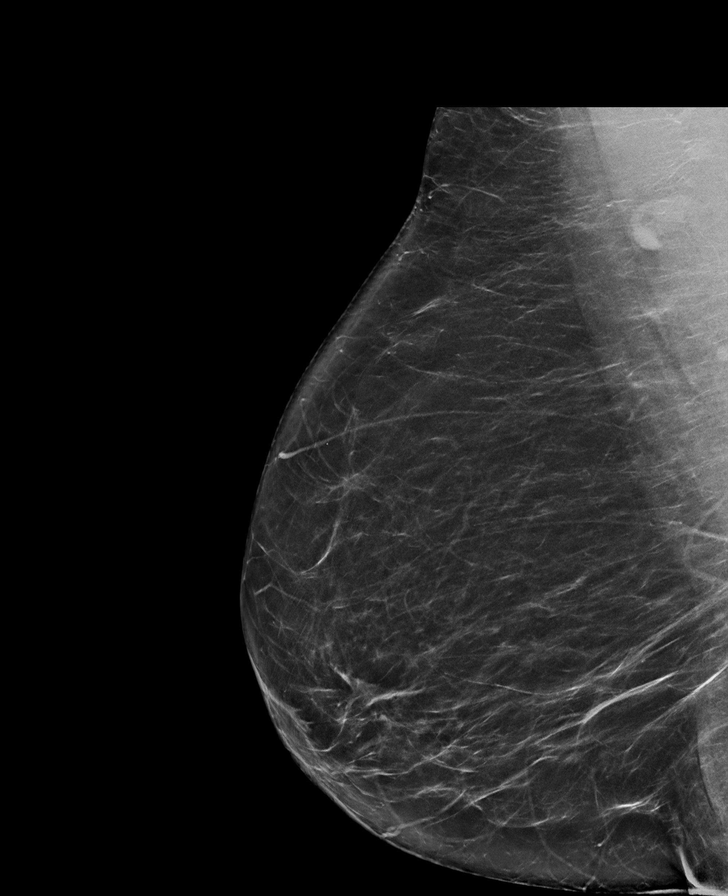

[R CC synth-2D]
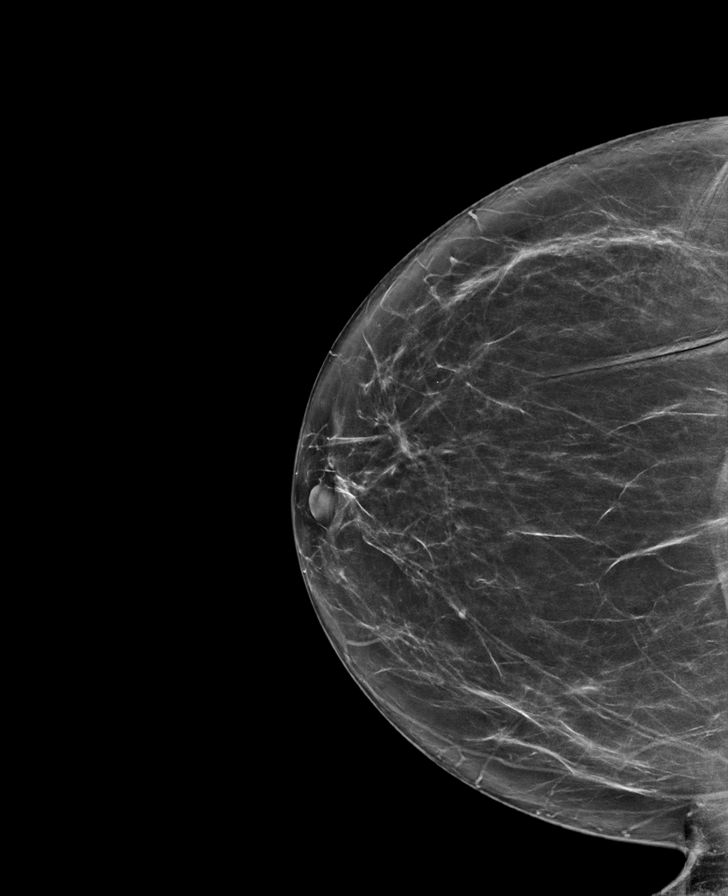

[L CC synth-2D]
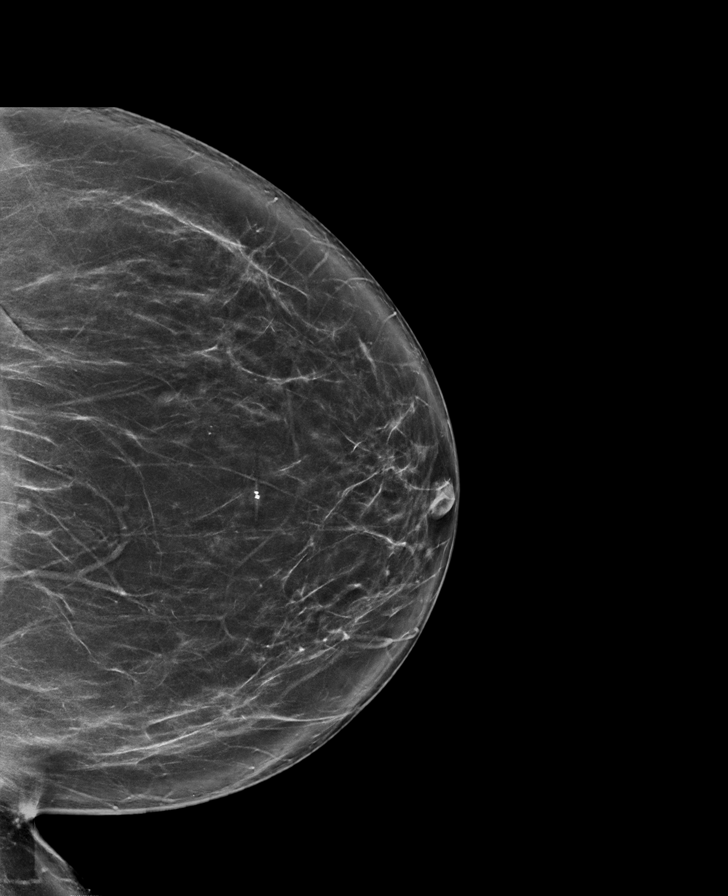

[L CC tomo · tomo slice 42/83.0]
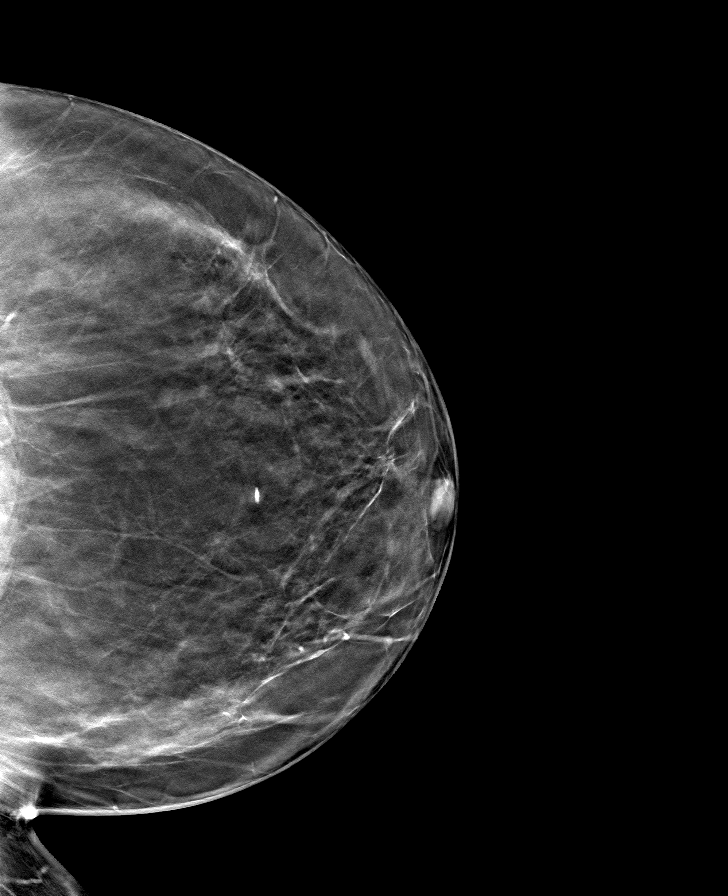

[R CC tomo · tomo slice 39/78.0]
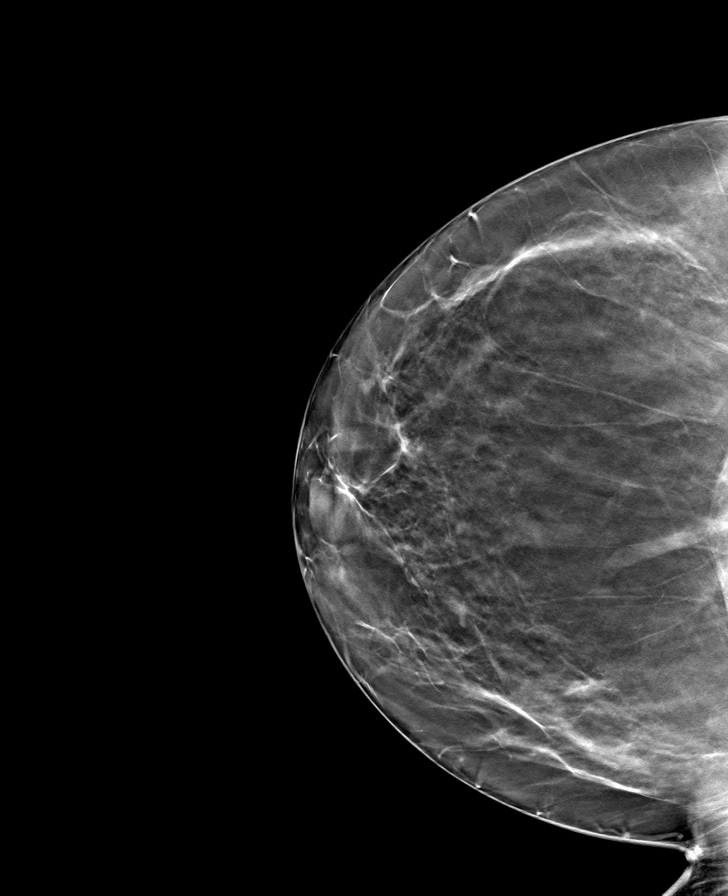

[R MLO tomo · tomo slice 45/89.0]
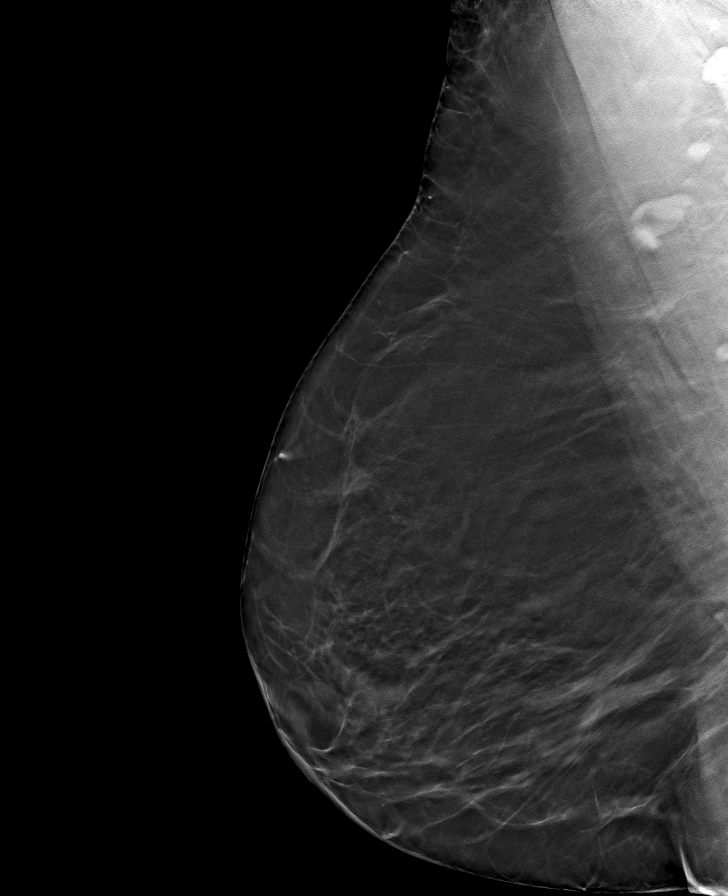

[L MLO tomo · tomo slice 42/83.0]
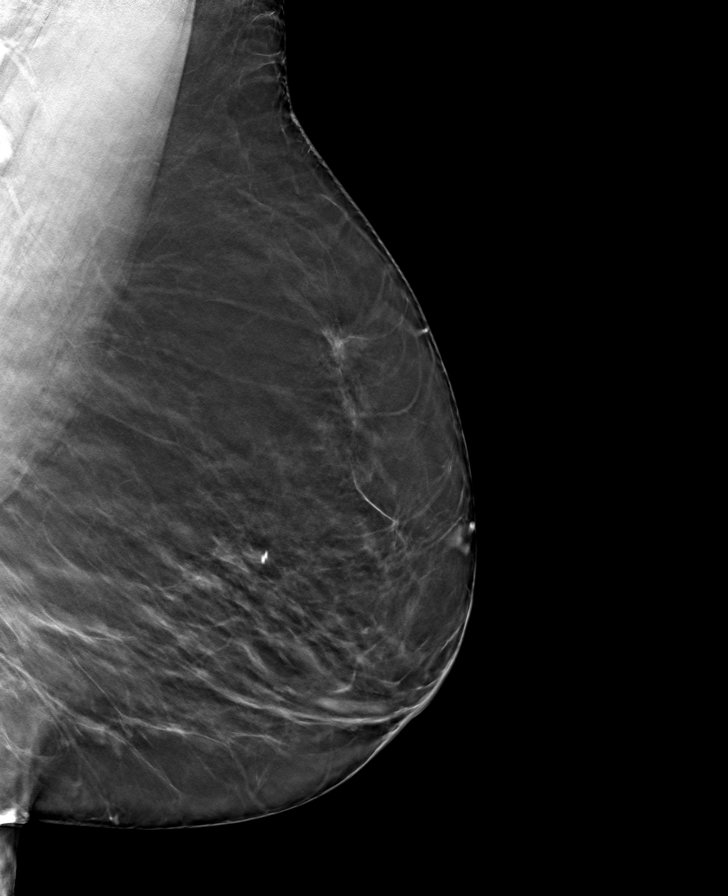

[8 of 24 positions shown; findings below may reference images not displayed]

ACR Breast Density Category b: There are scattered areas of
fibroglandular density.
FINDINGS: There are no findings suspicious for malignancy. Images were
processed with CAD.
IMPRESSION: No mammographic evidence of malignancy. A result letter of this
screening mammogram will be mailed directly to the patient.

RECOMMENDATION:
Screening mammogram in one year. (Code:[TQ])

BI-RADS CATEGORY  1: Negative.

## 2020-03-19 DIAGNOSIS — H43393 Other vitreous opacities, bilateral: Secondary | ICD-10-CM | POA: Diagnosis not present

## 2020-04-01 ENCOUNTER — Ambulatory Visit: Payer: Self-pay | Admitting: Pharmacist

## 2020-04-01 NOTE — Chronic Care Management (AMB) (Signed)
Chronic Care Management   Follow Up Note   04/01/2020 Name: Heather Gross MRN: [id number] DOB: 09/11/1945  Referred by: Alycia Rossetti, MD Reason for referral : No chief complaint on file.   Heather Gross is a 74 y.o. year old female who is a primary care patient of [place], Modena Nunnery, MD. The CCM team was consulted for assistance with chronic disease management and care coordination needs.    Review of patient status, including review of consultants reports, relevant laboratory and other test results, and collaboration with appropriate care team members and the patient's provider was performed as part of comprehensive patient evaluation and provision of chronic care management services.    SDOH (Social Determinants of Health) assessments performed: No See Care Plan activities for detailed interventions related to [facility])     Outpatient Encounter Medications as of 04/01/2020  Medication Sig  . amLODipine (NORVASC) 5 MG tablet Take 1 tablet (5 mg total) by mouth daily.  . ASPIRIN LOW DOSE 81 MG EC tablet TAKE 1 TABLET BY MOUTH EVERY DAY  . Calcium 500 MG CHEW Chew 2 tablets (1,000 mg total) by mouth daily.  . [facility] D3 2000 units CAPS TAKE 1 CAPSULE BY MOUTH EVERY DAY  . fluticasone (FLONASE) 50 MCG/ACT nasal spray   . lisinopril (ZESTRIL) 10 MG tablet TAKE 1 TABLET BY MOUTH EVERY DAY  . metFORMIN (GLUCOPHAGE) 500 MG tablet TAKE 1 TABLET (500 MG TOTAL) BY MOUTH AT BEDTIME.  . Multiple Vitamin (MULTIVITAMIN) tablet Take 1 tablet by mouth daily.  Marland Kitchen omeprazole (PRILOSEC) 20 MG capsule TAKE 1 CAPSULE BY MOUTH EVERY DAY  . simvastatin (ZOCOR) 80 MG tablet TAKE 1 TABLET BY MOUTH EVERYDAY AT BEDTIME   No facility-administered encounter medications on file as of 04/01/2020.     Reviewed chart prior to disease state call. Spoke with patient regarding BP Recent OV:  Weight down 10 lbs since August she cut back from when she had her rash and did not eat as much.  She was to schedule her eye  exam Recent Office Vitals: BP Readings from Last 3 Encounters:  12/30/19 138/82  08/29/19 120/68  05/06/19 132/76   Pulse Readings from Last 3 Encounters:  12/30/19 70  08/29/19 68  05/06/19 68    Wt Readings from Last 3 Encounters:  12/30/19 205 lb (93 kg)  08/29/19 215 lb (97.5 kg)  05/06/19 217 lb (98.4 kg)     Kidney Function Lab Results  Component Value Date/Time   CREATININE 1.12 (H) 12/30/2019 08:21 AM   CREATININE 1.20 (H) 08/29/2019 09:11 AM   GFRNONAA 49 (L) 12/30/2019 08:21 AM   GFRAA 56 (L) 12/30/2019 08:21 AM    BMP Latest Ref Rng & Units 12/30/2019 08/29/2019 05/06/2019  Glucose 65 - 99 mg/dL 129(H) 141(H) 143(H)  BUN 7 - 25 mg/dL 14 18 15   Creatinine 0.60 - 0.93 mg/dL 1.12(H) 1.20(H) 1.10(H)  BUN/Creat Ratio 6 - 22 (calc) 13 15 14   Sodium 135 - 146 mmol/L 141 141 141  Potassium 3.5 - 5.3 mmol/L 3.9 4.9 5.0  Chloride 98 - 110 mmol/L 109 107 106  CO2 20 - 32 mmol/L 22 26 24   Calcium 8.6 - 10.4 mg/dL 9.3 9.8 10.0    . Current antihypertensive regimen:  o Lisinopril 10mg  daily o Amlodipine 5mg  daily . How often are you checking your Blood Pressure? 1-2x per week . Current home BP readings: 120s-130s/80s . What recent interventions/DTPs have been made by any provider to improve Blood Pressure control  since last CPP Visit: None . Any recent hospitalizations or ED visits since last visit with CPP? Yes  o Patient went to ED on 12/12/2019 for a rash on her hands.  She gets this yearly, treated for contact dermatitis with Hydrocortisone 2.5% lotion. . What diet changes have been made to improve Blood Pressure Control?  o None . What exercise is being done to improve your Blood Pressure Control?  o Patient is walking 2 to 3 times per week   Adherence Review: Is the patient currently on ACE/ARB medication? Yes Does the patient have >5 day gap between last estimated fill dates? No  She reports 100% adherence   Recent Relevant Labs: Lab Results  Component  Value Date/Time   HGBA1C 6.6 (H) 12/30/2019 08:21 AM   HGBA1C 6.8 (H) 08/29/2019 09:11 AM   HGBA1C 5.8 (H) 11/02/2016 08:44 AM   HGBA1C 6.5 (H) 04/06/2016 08:18 AM   MICROALBUR 0.9 05/06/2019 08:49 AM   MICROALBUR 0.5 02/05/2017 09:52 AM    Kidney Function Lab Results  Component Value Date/Time   CREATININE 1.12 (H) 12/30/2019 08:21 AM   CREATININE 1.20 (H) 08/29/2019 09:11 AM   GFRNONAA 49 (L) 12/30/2019 08:21 AM   GFRAA 56 (L) 12/30/2019 08:21 AM    . Current antihyperglycemic regimen:  o Metformin 500mg  hs . What recent interventions/DTPs have been made to improve glycemic control:  o None . Have there been any recent hospitalizations or ED visits since last visit with CPP? Yes, see above . Patient denies hypoglycemic symptoms, including Sweaty, Shaky, Hungry and Vision changes . Patient denies hyperglycemic symptoms, including blurry vision, excessive thirst, polyuria and weakness . How often are you checking your blood sugar? never . What are your blood sugars ranging?  o N/A . During the week, how often does your blood glucose drop below 70? Unknown . Are you checking your feet daily/regularly?  o Yes patient does monitor her feet  Adherence Review: Is the patient currently on a STATIN medication? Yes Is the patient currently on ACE/ARB medication? Yes Does the patient have >5 day gap between last estimated fill dates? No  Patient seems to be doing well overall, she is interested in the flu shot so we got her to make an appointment for that in the next few weeks.  She is not currently checking blood sugar but continue to watch portion control and carbs.  BP has been controlled and she denies any symptoms of hypertension.  Beverly Milch, PharmD Clinical Pharmacist [place] [phone]

## 2020-04-06 ENCOUNTER — Other Ambulatory Visit: Payer: Self-pay | Admitting: Family Medicine

## 2020-04-06 DIAGNOSIS — I10 Essential (primary) hypertension: Secondary | ICD-10-CM

## 2020-04-06 DIAGNOSIS — E119 Type 2 diabetes mellitus without complications: Secondary | ICD-10-CM

## 2020-04-15 ENCOUNTER — Ambulatory Visit (INDEPENDENT_AMBULATORY_CARE_PROVIDER_SITE_OTHER): Payer: Medicare HMO | Admitting: *Deleted

## 2020-04-15 ENCOUNTER — Other Ambulatory Visit: Payer: Self-pay

## 2020-04-15 DIAGNOSIS — Z23 Encounter for immunization: Secondary | ICD-10-CM | POA: Diagnosis not present

## 2020-05-05 ENCOUNTER — Telehealth: Payer: Self-pay

## 2020-06-17 ENCOUNTER — Other Ambulatory Visit: Payer: Self-pay | Admitting: Family Medicine

## 2020-08-10 ENCOUNTER — Other Ambulatory Visit: Payer: Self-pay | Admitting: Family Medicine

## 2020-08-10 DIAGNOSIS — I10 Essential (primary) hypertension: Secondary | ICD-10-CM

## 2020-08-13 ENCOUNTER — Other Ambulatory Visit: Payer: Self-pay | Admitting: Family Medicine

## 2020-08-17 ENCOUNTER — Other Ambulatory Visit: Payer: Self-pay | Admitting: Family Medicine

## 2020-08-17 DIAGNOSIS — K219 Gastro-esophageal reflux disease without esophagitis: Secondary | ICD-10-CM

## 2020-09-09 ENCOUNTER — Other Ambulatory Visit: Payer: Self-pay | Admitting: Family Medicine

## 2020-09-11 ENCOUNTER — Other Ambulatory Visit: Payer: Self-pay | Admitting: Family Medicine

## 2020-09-30 ENCOUNTER — Other Ambulatory Visit: Payer: Self-pay | Admitting: Family Medicine

## 2020-12-16 ENCOUNTER — Other Ambulatory Visit: Payer: Self-pay | Admitting: Family Medicine

## 2021-09-01 ENCOUNTER — Emergency Department (HOSPITAL_COMMUNITY): Payer: Medicare HMO

## 2021-09-01 ENCOUNTER — Observation Stay (HOSPITAL_COMMUNITY): Payer: Medicare HMO

## 2021-09-01 ENCOUNTER — Encounter (HOSPITAL_COMMUNITY): Payer: Self-pay | Admitting: Emergency Medicine

## 2021-09-01 ENCOUNTER — Observation Stay (HOSPITAL_COMMUNITY)
Admission: EM | Admit: 2021-09-01 | Discharge: 2021-09-02 | Disposition: A | Discharge status: Home / Self Care | Payer: Medicare HMO | Attending: Family Medicine | Admitting: Family Medicine

## 2021-09-01 ENCOUNTER — Other Ambulatory Visit: Payer: Self-pay

## 2021-09-01 ENCOUNTER — Observation Stay (HOSPITAL_BASED_OUTPATIENT_CLINIC_OR_DEPARTMENT_OTHER): Payer: Medicare HMO

## 2021-09-01 DIAGNOSIS — G459 Transient cerebral ischemic attack, unspecified: Secondary | ICD-10-CM

## 2021-09-01 DIAGNOSIS — E1122 Type 2 diabetes mellitus with diabetic chronic kidney disease: Secondary | ICD-10-CM | POA: Diagnosis not present

## 2021-09-01 DIAGNOSIS — N1831 Chronic kidney disease, stage 3a: Secondary | ICD-10-CM | POA: Diagnosis not present

## 2021-09-01 DIAGNOSIS — Z7984 Long term (current) use of oral hypoglycemic drugs: Secondary | ICD-10-CM | POA: Insufficient documentation

## 2021-09-01 DIAGNOSIS — Z79899 Other long term (current) drug therapy: Secondary | ICD-10-CM | POA: Diagnosis not present

## 2021-09-01 DIAGNOSIS — Z7982 Long term (current) use of aspirin: Secondary | ICD-10-CM | POA: Insufficient documentation

## 2021-09-01 DIAGNOSIS — I447 Left bundle-branch block, unspecified: Secondary | ICD-10-CM | POA: Diagnosis not present

## 2021-09-01 DIAGNOSIS — E669 Obesity, unspecified: Secondary | ICD-10-CM | POA: Diagnosis not present

## 2021-09-01 DIAGNOSIS — I639 Cerebral infarction, unspecified: Principal | ICD-10-CM | POA: Diagnosis present

## 2021-09-01 DIAGNOSIS — R4701 Aphasia: Secondary | ICD-10-CM | POA: Diagnosis present

## 2021-09-01 DIAGNOSIS — Z91040 Latex allergy status: Secondary | ICD-10-CM | POA: Insufficient documentation

## 2021-09-01 DIAGNOSIS — M6281 Muscle weakness (generalized): Secondary | ICD-10-CM | POA: Insufficient documentation

## 2021-09-01 DIAGNOSIS — E785 Hyperlipidemia, unspecified: Secondary | ICD-10-CM | POA: Diagnosis present

## 2021-09-01 DIAGNOSIS — I10 Essential (primary) hypertension: Secondary | ICD-10-CM | POA: Diagnosis not present

## 2021-09-01 DIAGNOSIS — I129 Hypertensive chronic kidney disease with stage 1 through stage 4 chronic kidney disease, or unspecified chronic kidney disease: Secondary | ICD-10-CM | POA: Diagnosis not present

## 2021-09-01 DIAGNOSIS — N182 Chronic kidney disease, stage 2 (mild): Secondary | ICD-10-CM

## 2021-09-01 LAB — I-STAT CHEM 8, ED
BUN: 26 mg/dL — ABNORMAL HIGH (ref 8–23)
Calcium, Ion: 1.14 mmol/L — ABNORMAL LOW (ref 1.15–1.40)
Chloride: 103 mmol/L (ref 98–111)
Creatinine, Ser: 1.4 mg/dL — ABNORMAL HIGH (ref 0.44–1.00)
Glucose, Bld: 156 mg/dL — ABNORMAL HIGH (ref 70–99)
HCT: 38 % (ref 36.0–46.0)
Hemoglobin: 12.9 g/dL (ref 12.0–15.0)
Potassium: 3.9 mmol/L (ref 3.5–5.1)
Sodium: 140 mmol/L (ref 135–145)
TCO2: 23 mmol/L (ref 22–32)

## 2021-09-01 LAB — RAPID URINE DRUG SCREEN, HOSP PERFORMED
Amphetamines: NOT DETECTED
Barbiturates: NOT DETECTED
Benzodiazepines: NOT DETECTED
Cocaine: NOT DETECTED
Opiates: NOT DETECTED
Tetrahydrocannabinol: NOT DETECTED

## 2021-09-01 LAB — ECHOCARDIOGRAM COMPLETE
AR max vel: 3.16 cm2
AV Area VTI: 3.68 cm2
AV Area mean vel: 3.32 cm2
AV Mean grad: 3 mmHg
AV Peak grad: 5.7 mmHg
Ao pk vel: 1.19 m/s
S' Lateral: 3 cm
Weight: 3181.68 oz

## 2021-09-01 LAB — DIFFERENTIAL
Abs Immature Granulocytes: 0.04 10*3/uL (ref 0.00–0.07)
Basophils Absolute: 0 10*3/uL (ref 0.0–0.1)
Basophils Relative: 0 %
Eosinophils Absolute: 0.2 10*3/uL (ref 0.0–0.5)
Eosinophils Relative: 2 %
Immature Granulocytes: 0 %
Lymphocytes Relative: 16 %
Lymphs Abs: 1.7 10*3/uL (ref 0.7–4.0)
Monocytes Absolute: 0.5 10*3/uL (ref 0.1–1.0)
Monocytes Relative: 5 %
Neutro Abs: 8 10*3/uL — ABNORMAL HIGH (ref 1.7–7.7)
Neutrophils Relative %: 77 %

## 2021-09-01 LAB — URINALYSIS, ROUTINE W REFLEX MICROSCOPIC
Bilirubin Urine: NEGATIVE
Glucose, UA: NEGATIVE mg/dL
Hgb urine dipstick: NEGATIVE
Ketones, ur: NEGATIVE mg/dL
Leukocytes,Ua: NEGATIVE
Nitrite: NEGATIVE
Protein, ur: NEGATIVE mg/dL
Specific Gravity, Urine: 1.029 (ref 1.005–1.030)
pH: 5 (ref 5.0–8.0)

## 2021-09-01 LAB — COMPREHENSIVE METABOLIC PANEL
ALT: 16 U/L (ref 0–44)
AST: 23 U/L (ref 15–41)
Albumin: 3.9 g/dL (ref 3.5–5.0)
Alkaline Phosphatase: 53 U/L (ref 38–126)
Anion gap: 11 (ref 5–15)
BUN: 24 mg/dL — ABNORMAL HIGH (ref 8–23)
CO2: 23 mmol/L (ref 22–32)
Calcium: 9.5 mg/dL (ref 8.9–10.3)
Chloride: 104 mmol/L (ref 98–111)
Creatinine, Ser: 1.36 mg/dL — ABNORMAL HIGH (ref 0.44–1.00)
GFR, Estimated: 41 mL/min — ABNORMAL LOW (ref >60)
Glucose, Bld: 159 mg/dL — ABNORMAL HIGH (ref 70–99)
Potassium: 4 mmol/L (ref 3.5–5.1)
Sodium: 138 mmol/L (ref 135–145)
Total Bilirubin: 0.4 mg/dL (ref 0.3–1.2)
Total Protein: 7.1 g/dL (ref 6.5–8.1)

## 2021-09-01 LAB — CBC
HCT: 37.7 % (ref 36.0–46.0)
Hemoglobin: 12 g/dL (ref 12.0–15.0)
MCH: 30.5 pg (ref 26.0–34.0)
MCHC: 31.8 g/dL (ref 30.0–36.0)
MCV: 95.9 fL (ref 80.0–100.0)
Platelets: 160 10*3/uL (ref 150–400)
RBC: 3.93 MIL/uL (ref 3.87–5.11)
RDW: 13.4 % (ref 11.5–15.5)
WBC: 10.5 10*3/uL (ref 4.0–10.5)
nRBC: 0 % (ref 0.0–0.2)

## 2021-09-01 LAB — LIPID PANEL
Cholesterol: 114 mg/dL (ref 0–200)
HDL: 37 mg/dL — ABNORMAL LOW (ref >40)
LDL Cholesterol: 60 mg/dL (ref 0–99)
Total CHOL/HDL Ratio: 3.1 RATIO
Triglycerides: 86 mg/dL (ref <150)
VLDL: 17 mg/dL (ref 0–40)

## 2021-09-01 LAB — ETHANOL: Alcohol, Ethyl (B): <10 mg/dL (ref <10)

## 2021-09-01 LAB — TSH: TSH: 0.768 u[IU]/mL (ref 0.350–4.500)

## 2021-09-01 LAB — HEMOGLOBIN A1C
Hgb A1c MFr Bld: 6.5 % — ABNORMAL HIGH (ref 4.8–5.6)
Mean Plasma Glucose: 139.85 mg/dL

## 2021-09-01 LAB — CBG MONITORING, ED: Glucose-Capillary: 154 mg/dL — ABNORMAL HIGH (ref 70–99)

## 2021-09-01 LAB — TROPONIN I (HIGH SENSITIVITY): Troponin I (High Sensitivity): 8 ng/L (ref <18)

## 2021-09-01 LAB — APTT: aPTT: 27 seconds (ref 24–36)

## 2021-09-01 LAB — PROTIME-INR
INR: 0.9 (ref 0.8–1.2)
Prothrombin Time: 12.4 seconds (ref 11.4–15.2)

## 2021-09-01 IMAGING — CT CT ANGIO HEAD-NECK (W OR W/O PERF)
1 of 8 series · 14 of 47 positions shown · non-contrast
Comparison: Non-contrast head CT performed earlier today
[DATE].

CLINICAL DATA: Provided history: Neuro deficit, acute, stroke
suspected.

EXAM:
CT ANGIOGRAPHY HEAD AND NECK
TECHNIQUE: Multidetector CT imaging of the head and neck was performed using
the standard protocol during bolus administration of intravenous
contrast. Multiplanar CT image reconstructions and MIPs were
obtained to evaluate the vascular anatomy. Carotid stenosis
measurements (when applicable) are obtained utilizing NASCET
criteria, using the distal internal carotid diameter as the
denominator.

[Series 6: thin · axial · 0.43mm/px · z∈[-270,+12]mm · 14 of 651 slices shown]
[im 44/651  brain]
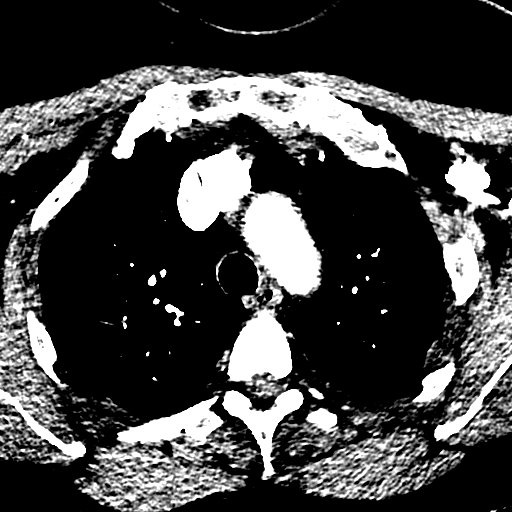
[im 87/651  bone]
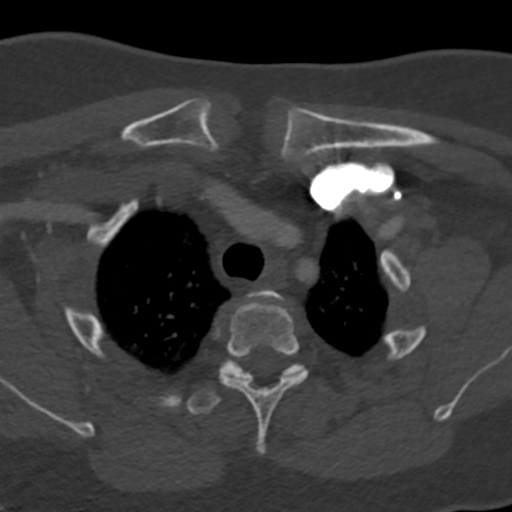
[im 131/651  brain]
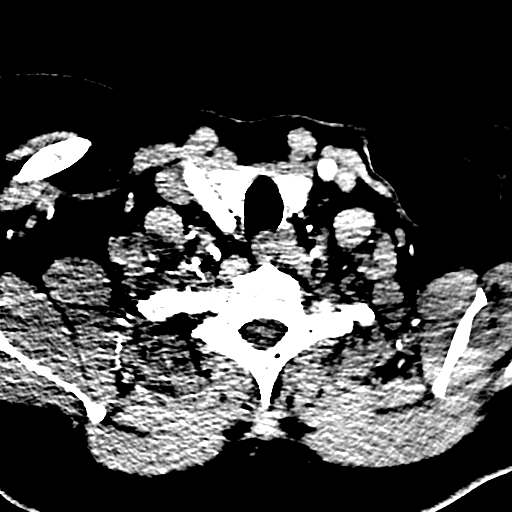
[im 174/651  bone]
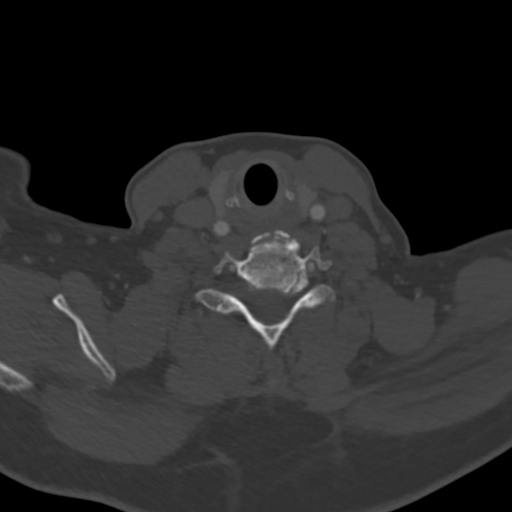
[im 217/651  brain]
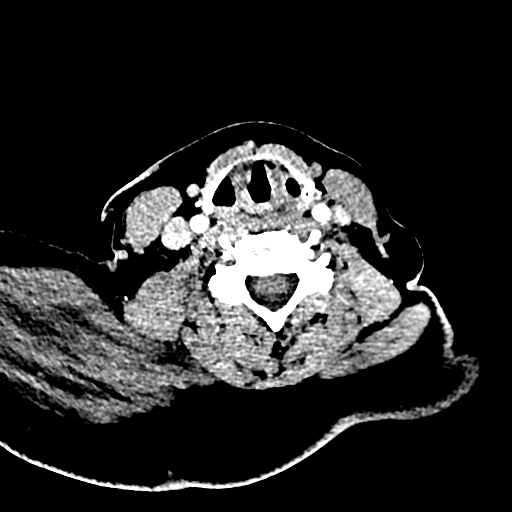
[im 261/651  bone]
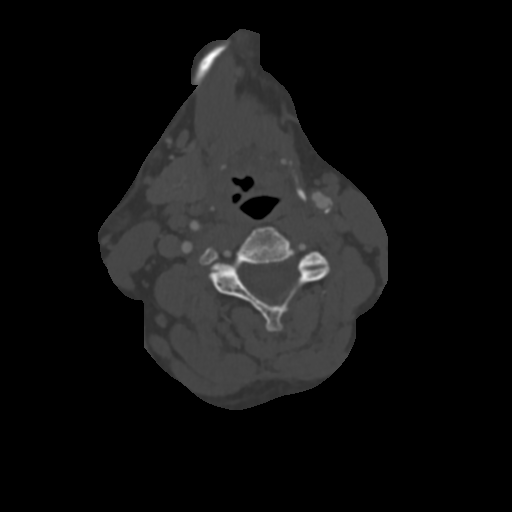
[im 304/651  brain]
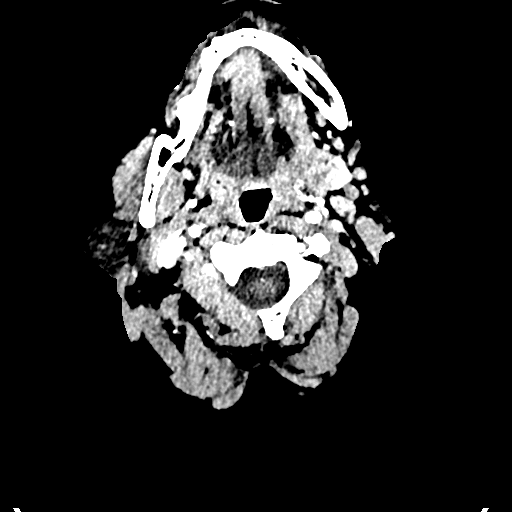
[im 347/651  bone]
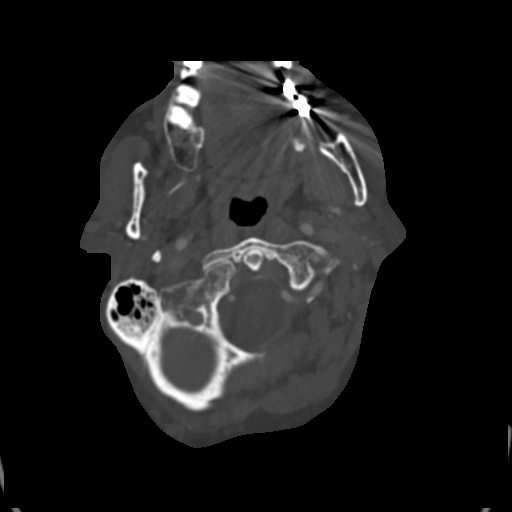
[im 391/651  brain]
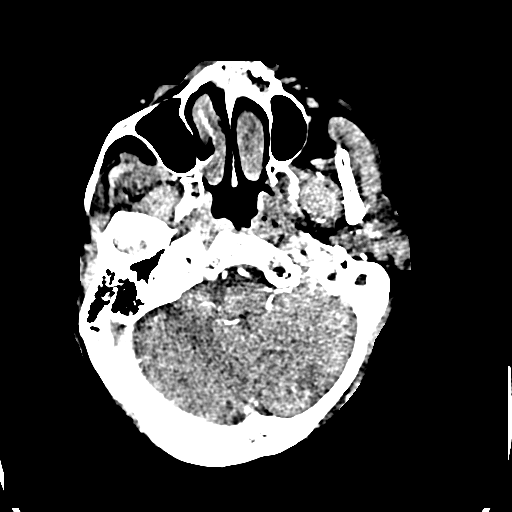
[im 434/651  bone]
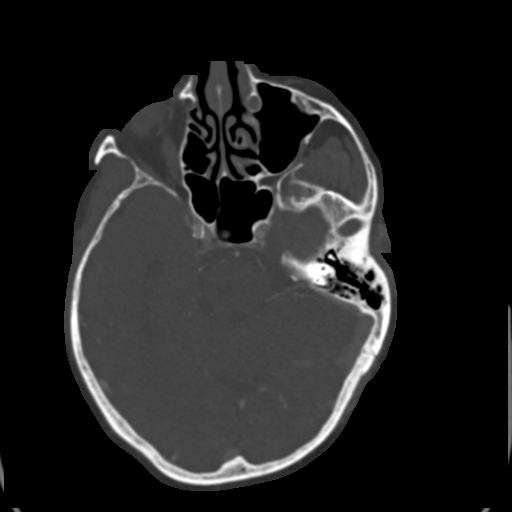
[im 477/651  brain]
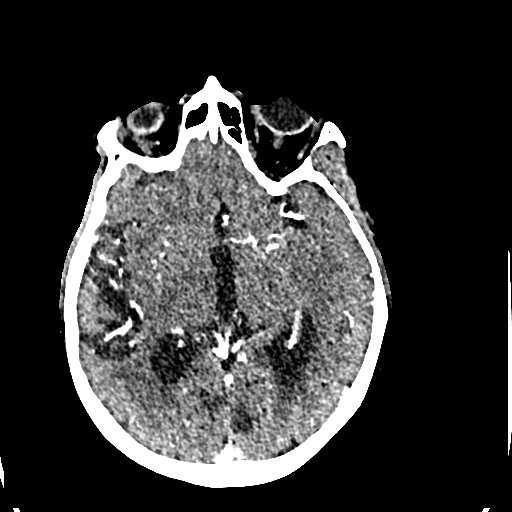
[im 521/651  bone]
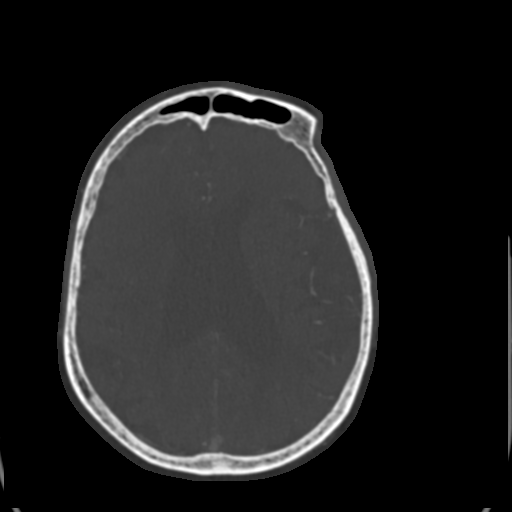
[im 564/651  brain]
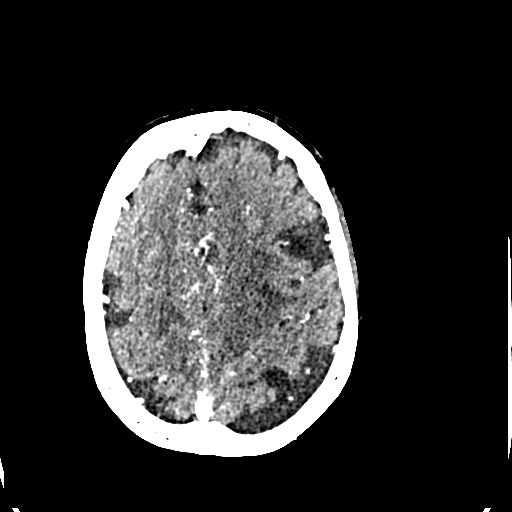
[im 607/651  bone]
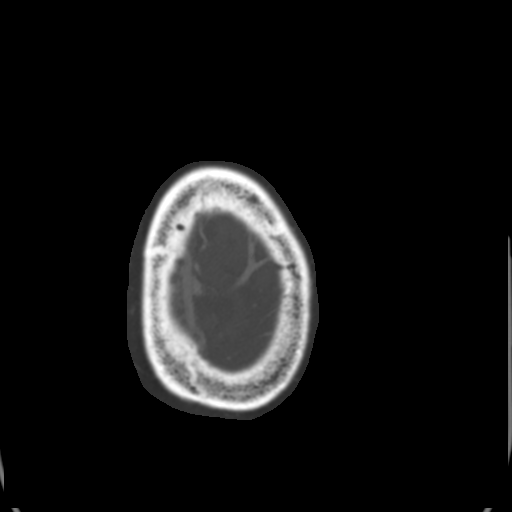

[14 of 47 positions shown; findings below may reference images not displayed]

RADIATION DOSE REDUCTION: This exam was performed according to the
departmental dose-optimization program which includes automated
exposure control, adjustment of the mA and/or kV according to
patient size and/or use of iterative reconstruction technique.

CONTRAST:  75mL OMNIPAQUE IOHEXOL 350 MG/ML SOLN
FINDINGS: CTA NECK FINDINGS

Aortic arch: The origin of the innominate and left common carotid
arteries.Atherosclerotic plaque within the visualized aortic arch
and proximal major branch vessels of the neck. Streak and beam
hardening artifact arising from a dense left-sided contrast bolus
partially obscures left subclavian artery. Within this limitation,
there is no appreciable hemodynamically significant stenosis within
the innominate or proximal subclavian arteries.

Right carotid system: CCA and ICA patent within the neck without
stenosis. Minimal atherosclerotic plaque about the carotid
bifurcation and within the proximal ICA.

Left carotid system: CCA and ICA patent within the neck without
hemodynamically significant stenosis (50% or greater). Minimal soft
plaque within the mid CCA. Mild atherosclerotic plaque about the
carotid bifurcation and within the proximal ICA.

Vertebral arteries: Codominant and patent within the neck without
stenosis.

Skeleton: Cervical levocurvature with partially imaged thoracic
dextrocurvature. Cervical spondylosis. No acute fracture or
aggressive osseous lesion.

Other neck: No neck mass or cervical lymphadenopathy. Subcentimeter
thyroid nodules, not meeting consensus criteria for ultrasound
follow-up based on size.

Upper chest: No consolidation within the imaged lung apices.

Review of the MIP images confirms the above findings

CTA HEAD FINDINGS

Anterior circulation:

The intracranial internal carotid arteries are patent.
Atherosclerotic plaque within both vessels. Up to moderate stenosis
within the distal cavernous/paraclinoid right ICA. Moderate stenosis
within the paraclinoid left ICA. The M1 middle cerebral arteries are
patent. Atherosclerotic irregularity of the M2 and more distal MCA
vessels, bilaterally. Most notably, a severe stenosis is present
within a superior division mid M2 right MCA vessel (series 11, image
32). The anterior cerebral arteries are patent. Atherosclerotic
irregularity of both vessels, most notably as follows. Severe focal
stenosis within the distal A3 left ACA. Moderate focal stenosis
within the distal A3 right ACA. No intracranial aneurysm is
identified.

Posterior circulation:

The intracranial vertebral arteries are patent. The basilar artery
is patent. The posterior cerebral arteries are patent.
Atherosclerotic irregularity of both vessels without high-grade
proximal stenosis. The right PCA is predominantly fetal in origin.
The left posterior communicating artery is diminutive or absent.

Venous sinuses: Within the limitations of contrast timing, no
convincing thrombus.

Anatomic variants: As described.

Review of the MIP images confirms the above findings

No emergent large vessel occlusion identified. These results were
communicated to Dr. OSONO at [DATE] pmon [DATE]by text page
via the [employer] messaging system.
IMPRESSION: CTA neck:

1. The common carotid, internal carotid and vertebral arteries are
patent within the neck without hemodynamically significant stenosis.
Mild atherosclerotic plaque about the carotid bifurcations and
within the proximal ICAs.
2.  Aortic Atherosclerosis ([WD]-[WD]).
3. Cervical spondylosis.

CTA head:

1. No intracranial large vessel occlusion is identified.
2. Intracranial atherosclerotic disease, most notably as follows.
3. Moderate stenosis within the paraclinoid left ICA.
4. Up to moderate stenosis within the distal cavernous/paraclinoid
right ICA.
5. Severe focal stenosis within a superior division mid M2 right MCA
vessel.
6. Severe focal stenosis within the distal A3 left ACA.
7. Moderate focal stenosis within the distal A3 right ACA.

## 2021-09-01 IMAGING — MR MR HEAD W/O CM
9 of 10 series · 36 of 48 positions shown · non-contrast
Comparison: CT studies earlier same day

CLINICAL DATA: Stroke.  Follow-up.

EXAM:
MRI HEAD WITHOUT CONTRAST
TECHNIQUE: Multiplanar, multiecho pulse sequences of the brain and surrounding
structures were obtained without intravenous contrast.

[Series 3: DWI · axial · 3.0mm · 1.09mm/px · z∈[-56,+103]mm · 9 of 108 slices shown (1 of 4)]
[im 1/108]
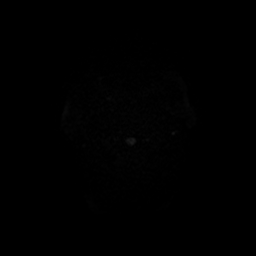
[im 20/108]
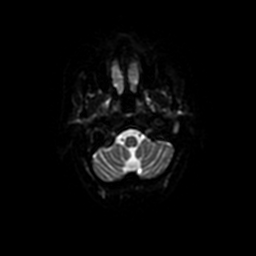
[im 30/108]
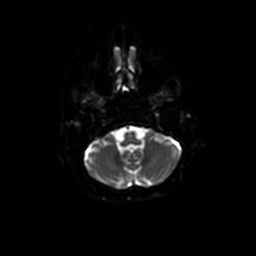
[im 49/108]
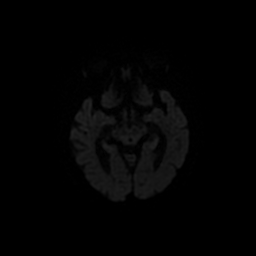
[im 59/108]
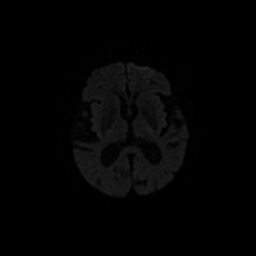
[im 78/108]
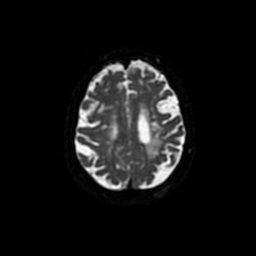
[im 88/108]
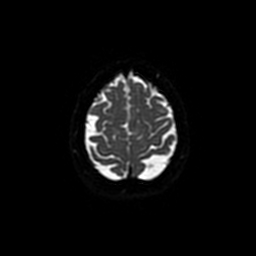
[im 98/108]
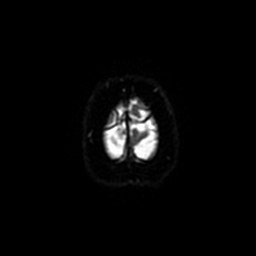
[im 108/108]
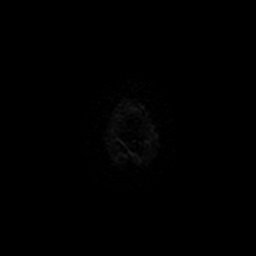

[Series 4: DWI · coronal · 5.0mm · 1.09mm/px · 7 of 72 slices shown (2 of 4)]
[im 1/72]
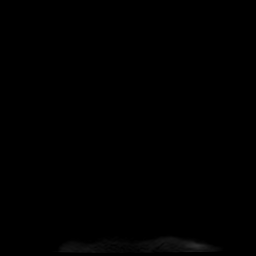
[im 12/72]
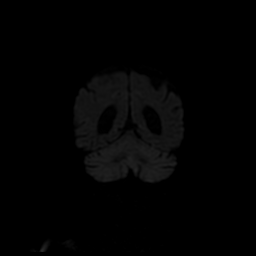
[im 24/72]
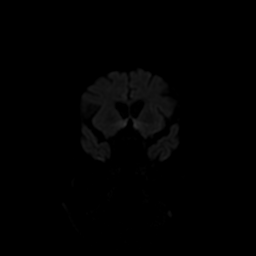
[im 36/72]
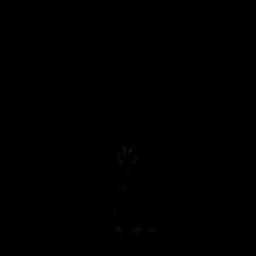
[im 48/72]
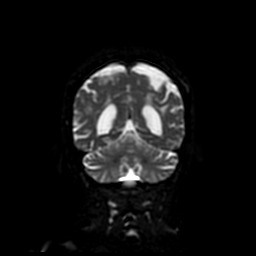
[im 60/72]
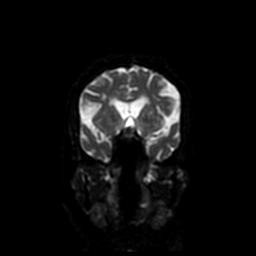
[im 72/72]
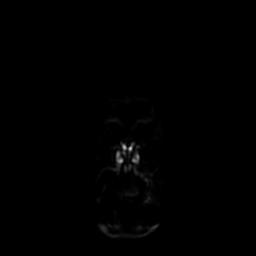

[Series 5: T1 · sagittal · 5.0mm · 0.47mm/px · 2 of 23 slices shown (1 of 2)]
[im 1/23]
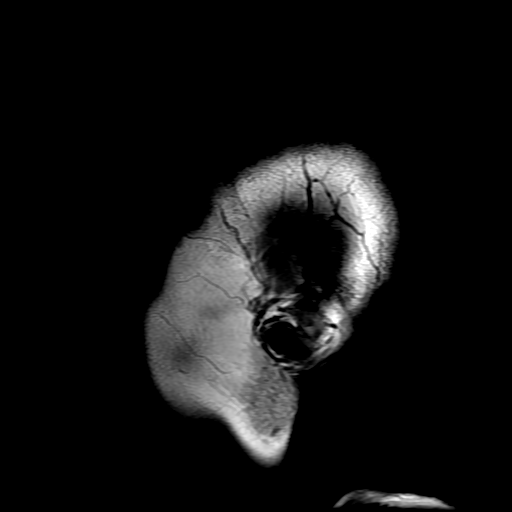
[im 23/23]
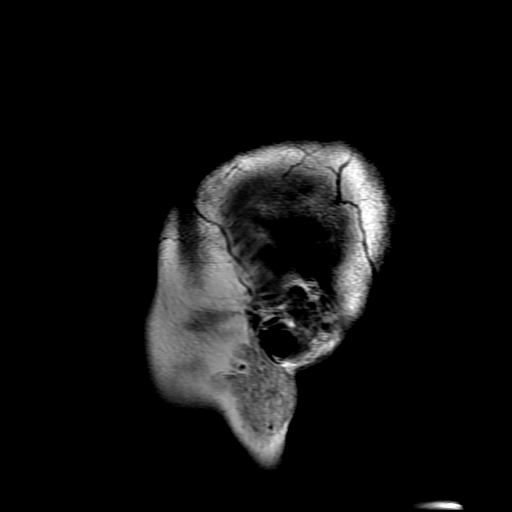

[Series 6: T2 · axial · 5.0mm · 0.43mm/px · z∈[-50,+88]mm · 2 of 24 slices shown (1 of 2)]
[im 1/24]
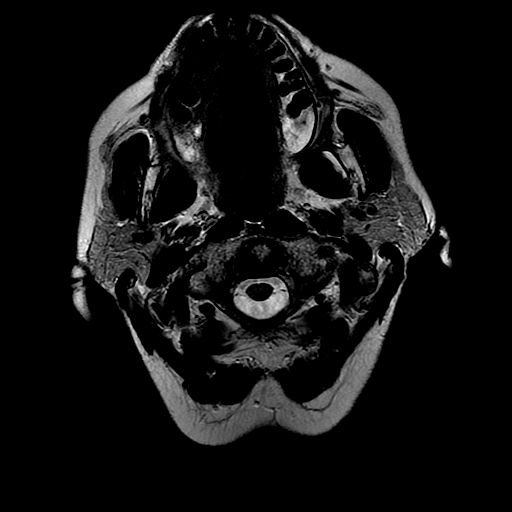
[im 24/24]
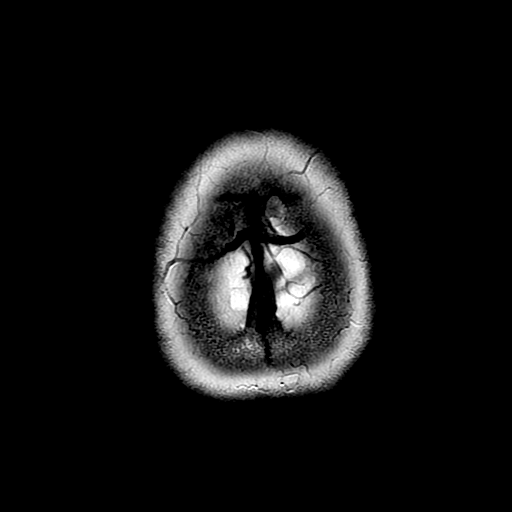

[Series 7: FLAIR · axial · 3.0mm · 0.43mm/px · z∈[-50,+88]mm · 2 of 24 slices shown]
[im 1/24]
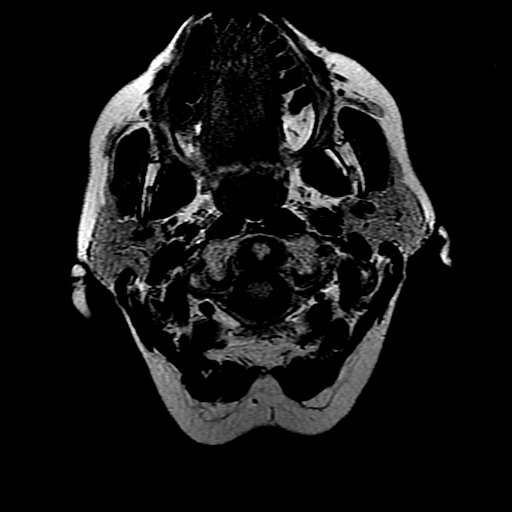
[im 24/24]
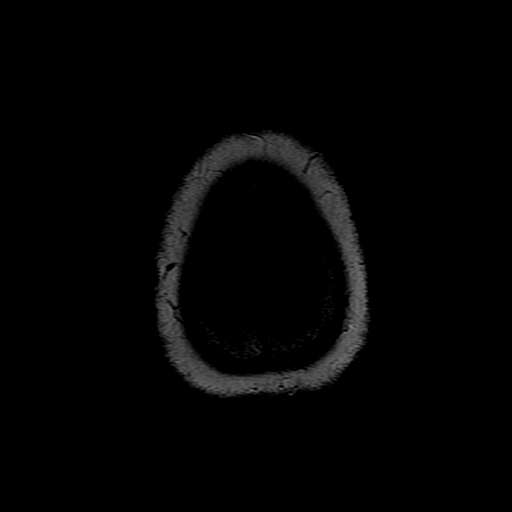

[Series 9: T1 · axial · 3.0mm · 0.43mm/px · z∈[-55,-5]mm · 3 of 100 slices shown (2 of 2)]
[im 1/100]
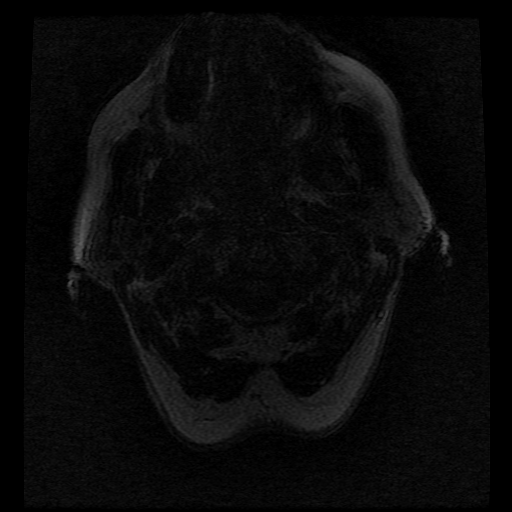
[im 12/100]
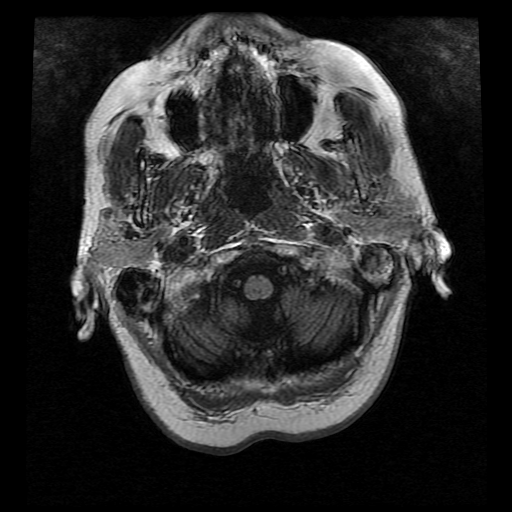
[im 34/100]
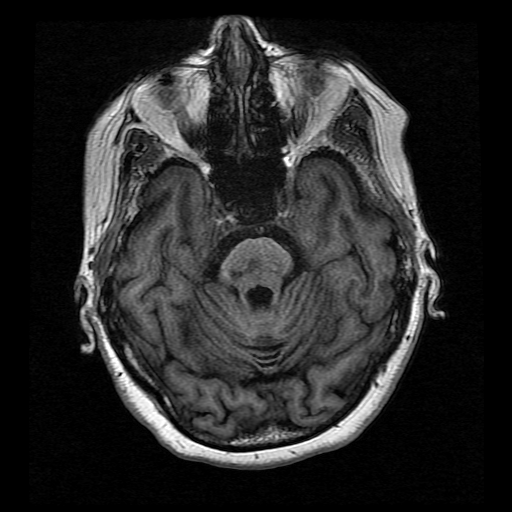

[Series 10: T2 · coronal · 5.0mm · 0.39mm/px · 2 of 24 slices shown (2 of 2)]
[im 1/24]
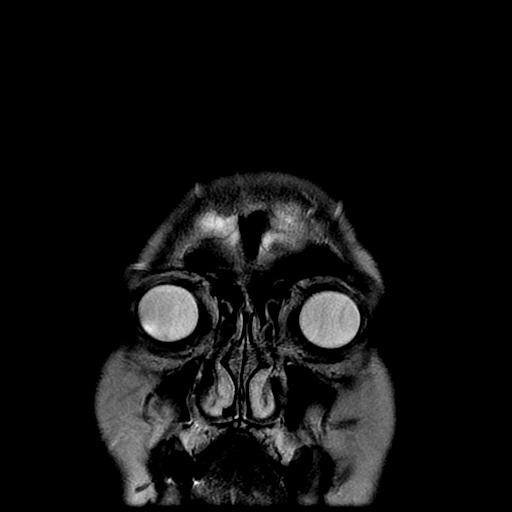
[im 24/24]
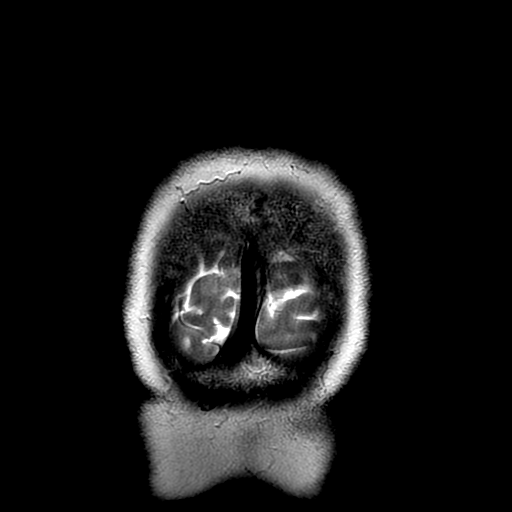

[Series 300: DWI · axial · 3.0mm · 1.09mm/px · z∈[-56,+103]mm · 5 of 54 slices shown (3 of 4)]
[im 1/54]
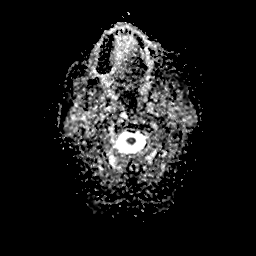
[im 14/54]
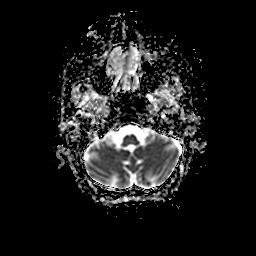
[im 27/54]
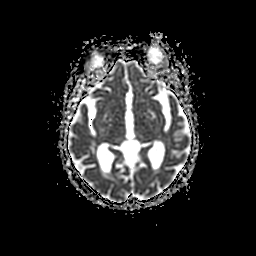
[im 40/54]
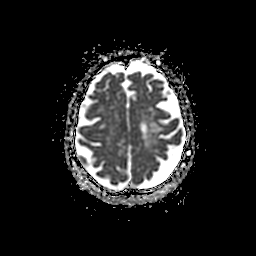
[im 54/54]
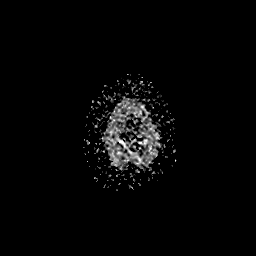

[Series 400: DWI · coronal · 5.0mm · 1.09mm/px · 4 of 36 slices shown (4 of 4)]
[im 1/36]
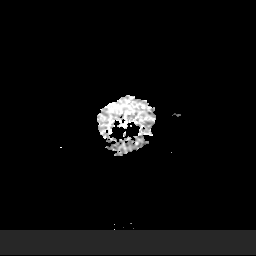
[im 12/36]
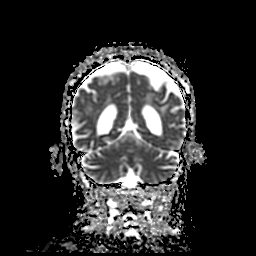
[im 24/36]
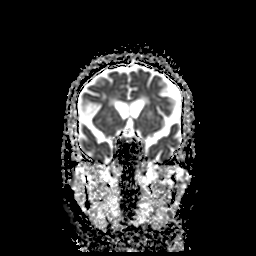
[im 36/36]
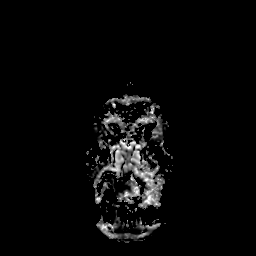

[36 of 48 positions shown; findings below may reference images not displayed]

FINDINGS: Brain: Diffusion imaging shows an 8 x 9 mm focus of acute infarction
at the right frontoparietal vertex, primarily affecting the
subcortical white matter. No large vessel territory stroke. No other
acute insult. Old small vessel infarctions affect the pons. No focal
cerebellar finding. Old small vessel infarctions affect the thalami.
Chronic small-vessel ischemic changes are present throughout the
cerebral hemispheric white matter. No evidence of intra-axial mass,
hemorrhage, hydrocephalus or extra-axial collection. There is an
extra-axial cyst measuring about 8 mm in diameter in the left
posterior frontal region, not felt to be significant.

Vascular: Major vessels at the base of the brain show flow.

Skull and upper cervical spine: Negative

Sinuses/Orbits: Clear/normal

Other: Small left mastoid effusion.
IMPRESSION: 8 x 9 mm acute infarction at the right frontoparietal vertex
primarily affecting the subcortical white matter.

Extensive chronic small-vessel ischemic changes elsewhere throughout
the brain as outlined above.

## 2021-09-01 IMAGING — CT CT HEAD CODE STROKE
3 series · 14 of 47 positions shown, 16 images · non-contrast
Comparison: None Available.

CLINICAL DATA: Code stroke.  Slurred speech



[Series 3: head 5.0 h30s · axial · 0.39mm/px · z∈[-51,+79]mm · 8 of 32 slices shown, 10 images]
[im 3/32  brain]
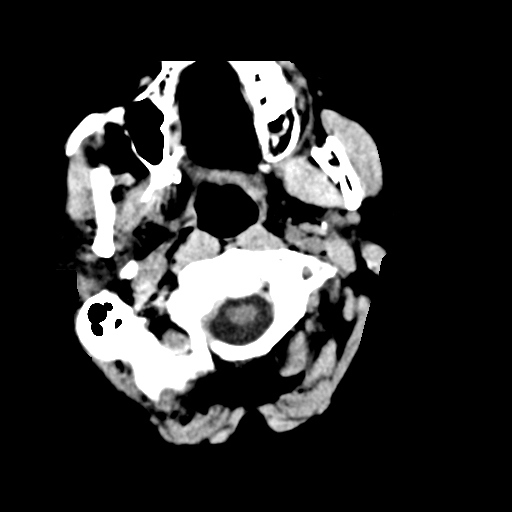
[im 3/32  bone]
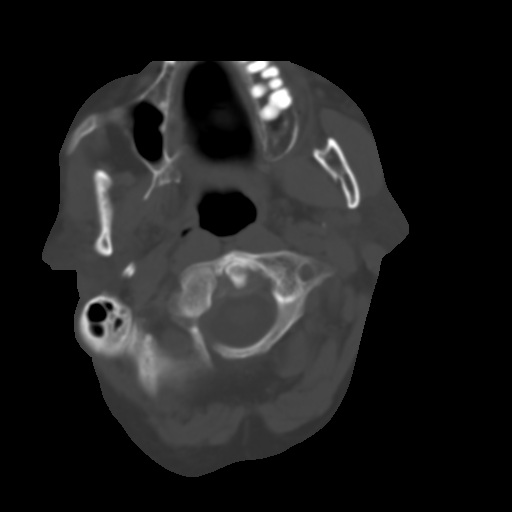
[im 7/32  brain]
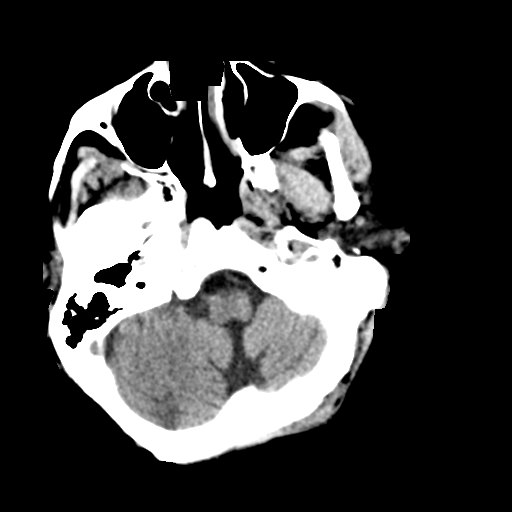
[im 10/32  brain]
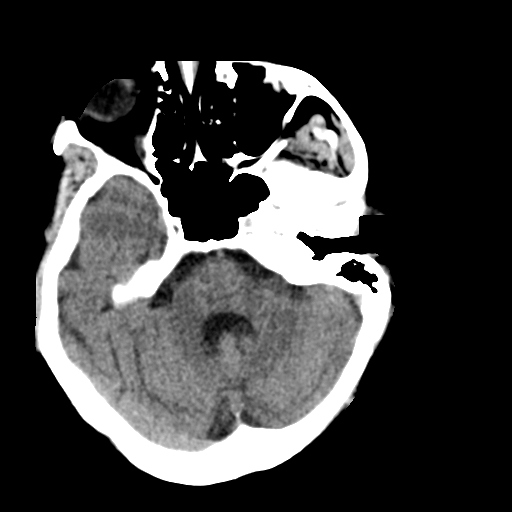
[im 14/32  brain]
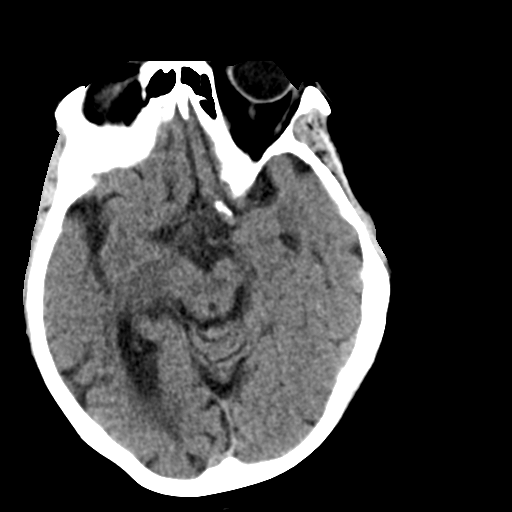
[im 18/32  brain]
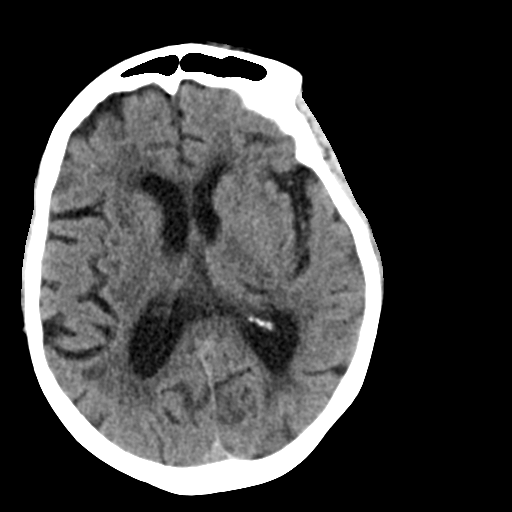
[im 18/32  bone]
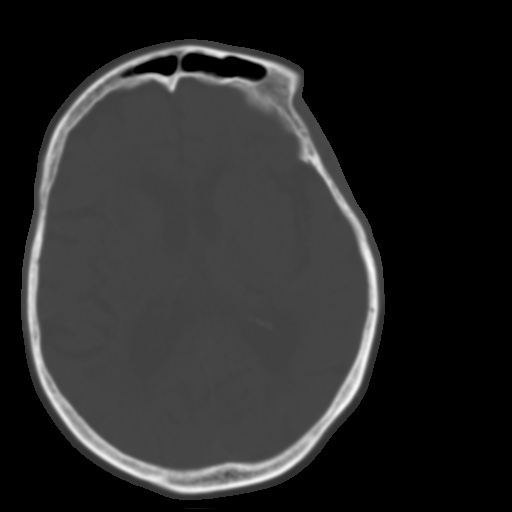
[im 22/32  brain]
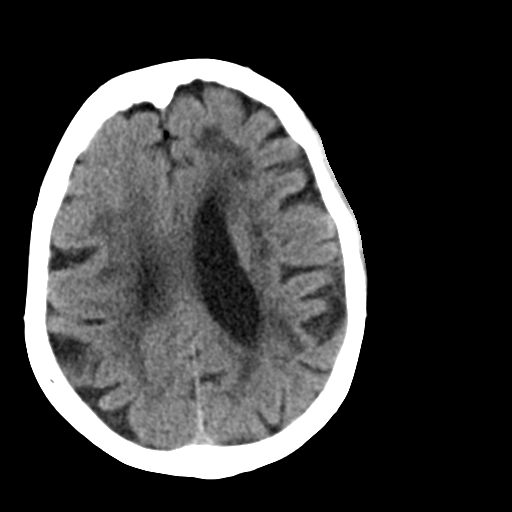
[im 25/32  brain]
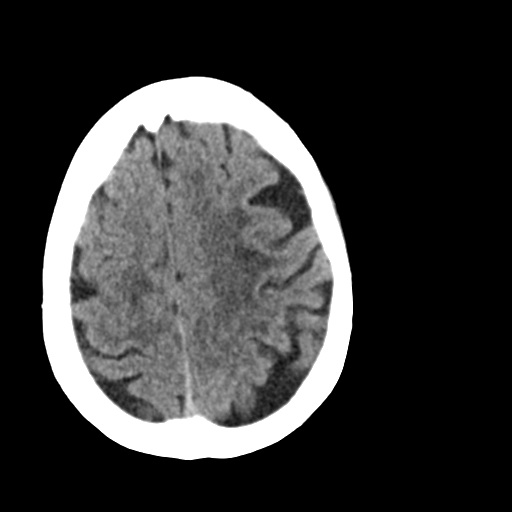
[im 29/32  brain]
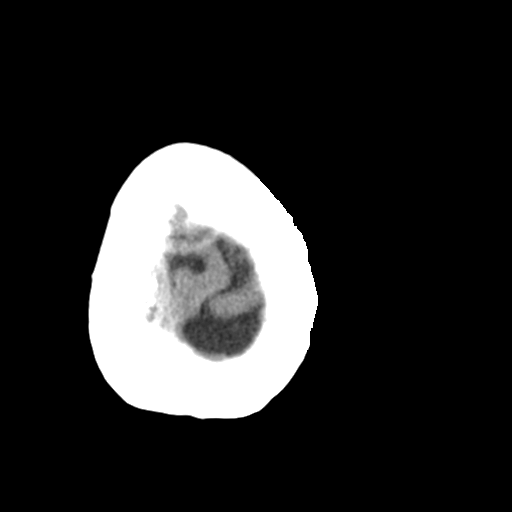

[Series 5: head 3.0 mpr cor · coronal · 0.30mm/px · 3 of 67 slices shown]
[im 23/67  brain]
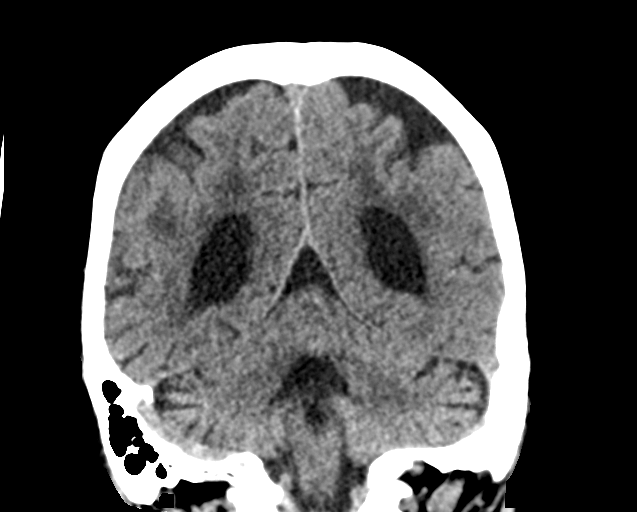
[im 30/67  brain]
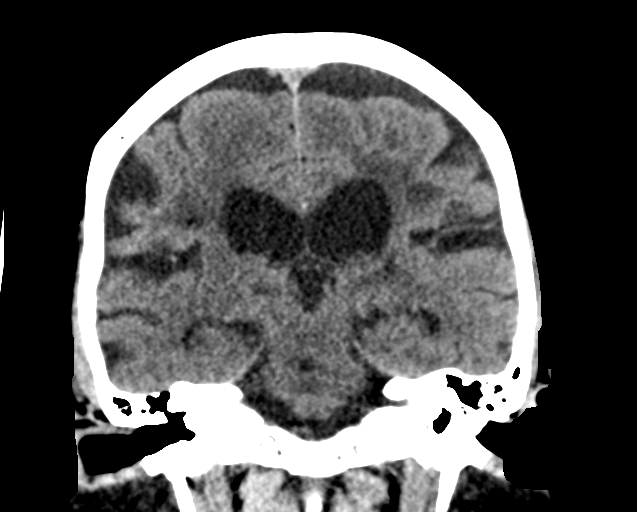
[im 37/67  brain]
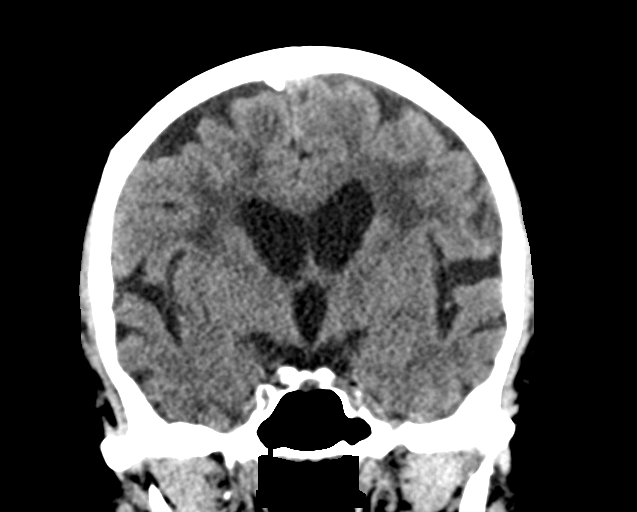

[Series 6: head 3.0 mpr sag · sagittal · 0.30mm/px · 3 of 57 slices shown]
[im 22/57  brain]
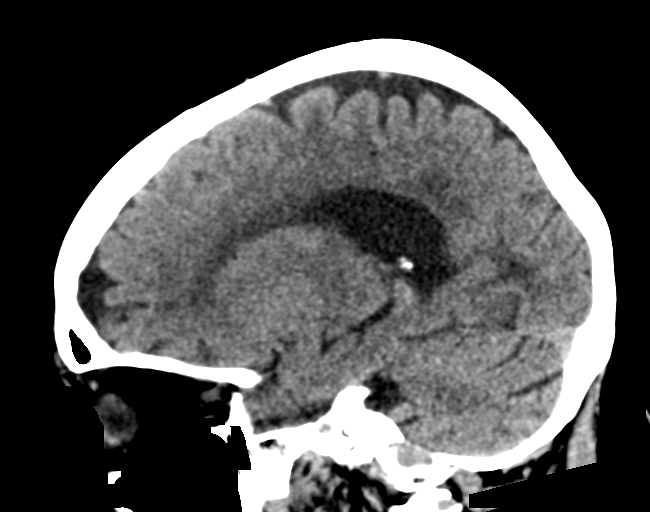
[im 29/57  brain]
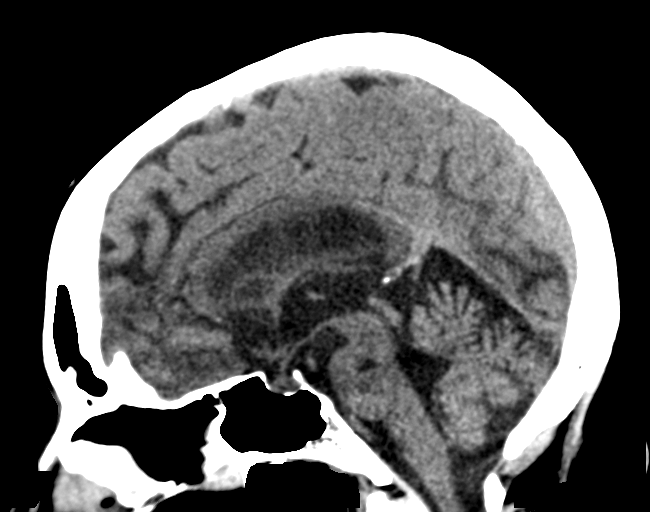
[im 35/57  brain]
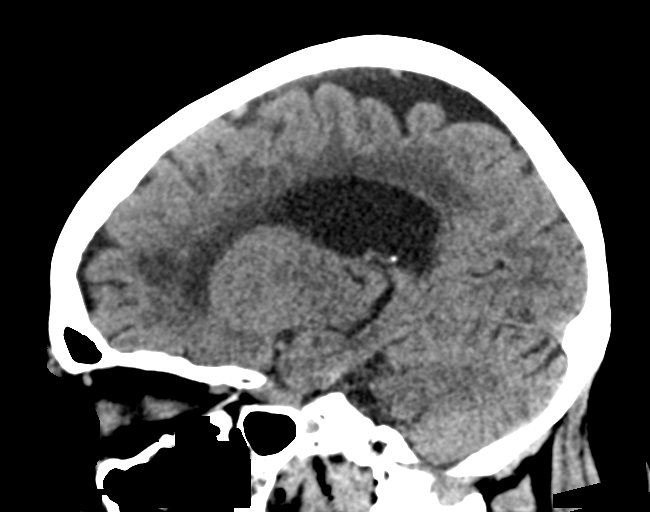

[14 of 47 positions shown; findings below may reference images not displayed]

FINDINGS: Brain: There is no acute intracranial hemorrhage, mass effect, or
edema. Prominence of the ventricles and sulci reflects parenchymal
volume loss. Patchy and confluent areas low-density in the
supratentorial white matter are nonspecific but probably reflect
moderate to marked chronic microvascular ischemic changes. There are
age-indeterminate superimposed small vessel infarcts of the central
white matter and deep gray nuclei. Age-indeterminate small vessel
infarct of the right parasagittal pons. No extra-axial collection.

Vascular: No hyperdense vessel.

Skull: Unremarkable.

Sinuses/Orbits: Minor mucosal thickening.  Orbits are unremarkable.

Other: Mastoid air cells are clear.

ASPECTS ([place] Stroke Program Early CT Score)

- Ganglionic level infarction (caudate, lentiform nuclei, internal
capsule, insula, M1-M3 cortex): 7

- Supraganglionic infarction (M4-M6 cortex): 3

Total score (0-10 with 10 being normal): 10
IMPRESSION: There is no acute intracranial hemorrhage or evidence of acute
infarction. ASPECT score is 10.

Moderate to marked chronic microvascular ischemic changes with
several age-indeterminate small vessel infarcts.

These results were communicated to Dr. INGGA at [DATE] on
[DATE] by text page via the [employer] messaging system.

## 2021-09-01 MED ORDER — ACETAMINOPHEN 650 MG RE SUPP: 650.0000 mg | RECTAL | Status: DC | PRN

## 2021-09-01 MED ORDER — IOHEXOL 350 MG/ML SOLN
100.0000 mL | Freq: Once | INTRAVENOUS | Status: AC | PRN
  Administered 2021-09-01: 75 mL via INTRAVENOUS

## 2021-09-01 MED ORDER — ACETAMINOPHEN 325 MG PO TABS: 650.0000 mg | ORAL_TABLET | ORAL | Status: DC | PRN

## 2021-09-01 MED ORDER — ASPIRIN EC 81 MG PO TBEC
81.0000 mg | DELAYED_RELEASE_TABLET | Freq: Every day | ORAL | Status: DC
  Administered 2021-09-01 – 2021-09-02 (×2): 81 mg via ORAL
  Filled 2021-09-01 (×2): qty 1

## 2021-09-01 MED ORDER — PERFLUTREN LIPID MICROSPHERE
1.0000 mL | INTRAVENOUS | Status: AC | PRN
  Administered 2021-09-01: 2 mL via INTRAVENOUS
  Filled 2021-09-01: qty 10

## 2021-09-01 MED ORDER — CLOPIDOGREL BISULFATE 75 MG PO TABS
75.0000 mg | ORAL_TABLET | Freq: Every day | ORAL | Status: DC
  Administered 2021-09-02: 75 mg via ORAL
  Filled 2021-09-01: qty 1

## 2021-09-01 MED ORDER — STROKE: EARLY STAGES OF RECOVERY BOOK
Freq: Once | Status: AC
  Filled 2021-09-01: qty 1

## 2021-09-01 MED ORDER — SODIUM CHLORIDE 0.9 % IV SOLN: Freq: Once | INTRAVENOUS | Status: AC

## 2021-09-01 MED ORDER — CLOPIDOGREL BISULFATE 300 MG PO TABS
300.0000 mg | ORAL_TABLET | Freq: Once | ORAL | Status: AC
  Administered 2021-09-01: 300 mg via ORAL
  Filled 2021-09-01: qty 1

## 2021-09-01 MED ORDER — ENOXAPARIN SODIUM 40 MG/0.4ML IJ SOSY
40.0000 mg | PREFILLED_SYRINGE | INTRAMUSCULAR | Status: DC
  Administered 2021-09-01: 40 mg via SUBCUTANEOUS
  Filled 2021-09-01: qty 0.4

## 2021-09-01 MED ORDER — ACETAMINOPHEN 160 MG/5ML PO SOLN
650.0000 mg | ORAL | Status: DC | PRN
Start: 2021-09-01 — End: 2021-09-02

## 2021-09-01 NOTE — Consult Note (Signed)
Neurology Consultation ? ?Reason for Consult: Code Stroke ?Referring Physician: Zenia Resides ? ?CC: Aphasia ? ?History is obtained from: Patient ? ?HPI: Heather Gross is a 76 y.o. female with a past medical history of glaucoma, CKD IIIa, DM2, HLD, and HTN. Patient presented to the emergency room after a fall and trouble speaking. Upon presentation her speech is clear. Glucose 159, BP 127/65.  She states that she was mopping her floor and she felt like she was walking slow, she called her husband into the room, and then slid to the floor. She had also called work and told them she wasn't coming into work today. She believes they are who called EMS. ? ?LKW: 1130 ?TNK given?: no, NIH 0, no stroke on imaging ?IR Thrombectomy? No, no LVO ?Modified Rankin Scale: 0-Completely asymptomatic and back to baseline post- stroke ? ?ROS: A complete ROS was performed and is negative except as noted in the HPI. ?Past Medical History:  ?Diagnosis Date  ? Glaucoma   ? Hyperlipidemia   ? Hypertension   ? ? ? ?Family History  ?Problem Relation Age of Onset  ? Diabetes Sister   ? Breast cancer Sister   ? Diabetes Brother   ? Diabetes Sister   ? Diabetes Sister   ? Diabetes Sister   ? Diabetes Sister   ? ? ?Social History:  ? reports that she quit smoking about 31 years ago. She has never used smokeless tobacco. She reports that she does not drink alcohol and does not use drugs. ? ?Medications ? ?Current Facility-Administered Medications:  ?  aspirin EC tablet 81 mg, 81 mg, Oral, Daily, Shafer, Devon, NP ?  clopidogrel (PLAVIX) tablet 300 mg, 300 mg, Oral, Once, Janine Ores, NP ?  [START ON 09/02/2021] clopidogrel (PLAVIX) tablet 75 mg, 75 mg, Oral, Daily, Janine Ores, NP ? ?Current Outpatient Medications:  ?  amLODipine (NORVASC) 5 MG tablet, Take 1 tablet (5 mg total) by mouth daily., Disp: 90 tablet, Rfl: 3 ?  ASPIRIN LOW DOSE 81 MG EC tablet, TAKE 1 TABLET BY MOUTH EVERY DAY, Disp: 90 tablet, Rfl: 3 ?  Calcium 500 MG CHEW, Chew 2 tablets  (1,000 mg total) by mouth daily., Disp: 30 tablet, Rfl: 0 ?  [employer] D3 2000 units CAPS, TAKE 1 CAPSULE BY MOUTH EVERY DAY, Disp: 100 capsule, Rfl: 2 ?  fluticasone (FLONASE) 50 MCG/ACT nasal spray, , Disp: , Rfl:  ?  lisinopril (ZESTRIL) 10 MG tablet, TAKE 1 TABLET BY MOUTH EVERY DAY, Disp: 30 tablet, Rfl: 0 ?  metFORMIN (GLUCOPHAGE) 500 MG tablet, TAKE 1 TABLET BY MOUTH AT BEDTIME., Disp: 30 tablet, Rfl: 0 ?  Multiple Vitamin (MULTIVITAMIN) tablet, Take 1 tablet by mouth daily., Disp: , Rfl:  ?  omeprazole (PRILOSEC) 20 MG capsule, TAKE 1 CAPSULE BY MOUTH EVERY DAY, Disp: 90 capsule, Rfl: 0 ?  simvastatin (ZOCOR) 80 MG tablet, TAKE 1 TABLET BY MOUTH EVERYDAY AT BEDTIME, Disp: 90 tablet, Rfl: 0 ? ?Exam: ?Current vital signs: ?BP (!) 153/69   Pulse 98   Resp 16   Wt 90.2 kg   BMI 33.09 kg/m?  ?Vital signs in last 24 hours: ?Pulse Rate:  [98] 98 (05/04 1416) ?Resp:  [16] 16 (05/04 1416) ?BP: (153)/(69) 153/69 (05/04 1416) ?Weight:  [90.2 kg] 90.2 kg (05/04 1300) ? ?GENERAL: Awake, alert, in no acute distress ?Psych: Affect appropriate for situation, patient is calm and cooperative with examination ?Head: Normocephalic and atraumatic, without obvious abnormality ?EENT: Normal conjunctivae, dry mucous membranes, no OP obstruction ?  LUNGS: Normal respiratory effort. Non-labored breathing on room air ?CV: Regular rate and rhythm on telemetry ?ABDOMEN: Soft, non-tender, non-distended ?Extremities: warm, well perfused, without obvious deformity ? ?NEURO:  ?Mental Status: Awake, alert, and oriented to person, place, time, and situation. ?He/She is able to provide a clear and coherent history of present illness. ?Speech/Language: speech is clear.   ?Naming, repetition, fluency, and comprehension intact without aphasia  ?No neglect is noted ?Cranial Nerves:  ?II: PERRL 3 mm/brisk. visual fields full.  ?III, IV, VI: EOMI. Lid elevation symmetric and full.  ?V: Sensation is intact to light touch and symmetrical to face. Blinks  to threat. Moves jaw back and forth.  ?VII: Face is symmetric resting and smiling. Able to puff cheeks and raise eyebrows.  ?VIII: Hearing intact to voice ?IX, X: Palate elevation is symmetric. Phonation normal.  ?XI: Normal sternocleidomastoid and trapezius muscle strength ?XII: Tongue protrudes midline without fasciculations.   ?Motor: 5/5 strength is all muscle groups.  ?Tone is normal. Bulk is normal.  ?Sensation: Intact to light touch bilaterally in all four extremities. No extinction to DSS present.  ?Coordination: FTN intact bilaterally. HKS intact bilaterally. No pronator drift. Alternating hand movements.  ?DTRs: 2+ throughout.  ?Gait: Deferred ? ?NIHSS: ?1a Level of Conscious.: 0 ?1b LOC Questions: 0 ?1c LOC Commands: 0 ?2 Best Gaze: 0 ?3 Visual: 0 ?4 Facial Palsy: 0 ?5a Motor Arm - left: 0 ?5b Motor Arm - Right: 0 ?6a Motor Leg - Left: 0 ?6b Motor Leg - Right: 0 ?7 Limb Ataxia: 0 ?8 Sensory: 0 ?9 Best Language: 0 ?10 Dysarthria: 0 ?11 Extinct. and Inatten.: 0 ?TOTAL: 0 ? ? ?Labs ?I have reviewed labs in epic and the results pertinent to this consultation are: ? ?CBC ?   ?Component Value Date/Time  ? WBC 10.5 09/01/2021 1355  ? RBC 3.93 09/01/2021 1355  ? HGB 12.9 09/01/2021 1357  ? HCT 38.0 09/01/2021 1357  ? PLT 160 09/01/2021 1355  ? MCV 95.9 09/01/2021 1355  ? MCH 30.5 09/01/2021 1355  ? MCHC 31.8 09/01/2021 1355  ? RDW 13.4 09/01/2021 1355  ? LYMPHSABS 1.7 09/01/2021 1355  ? MONOABS 0.5 09/01/2021 1355  ? EOSABS 0.2 09/01/2021 1355  ? BASOSABS 0.0 09/01/2021 1355  ? ? ?CMP  ?   ?Component Value Date/Time  ? NA 140 09/01/2021 1357  ? K 3.9 09/01/2021 1357  ? CL 103 09/01/2021 1357  ? CO2 22 12/30/2019 0821  ? GLUCOSE 156 (H) 09/01/2021 1357  ? BUN 26 (H) 09/01/2021 1357  ? CREATININE 1.40 (H) 09/01/2021 1357  ? CREATININE 1.12 (H) 12/30/2019 9604  ? CALCIUM 9.3 12/30/2019 0821  ? PROT 6.7 12/30/2019 0821  ? ALBUMIN 4.0 07/20/2016 0821  ? AST 18 12/30/2019 0821  ? ALT 10 12/30/2019 0821  ? ALKPHOS 63  07/20/2016 0821  ? BILITOT 0.4 12/30/2019 0821  ? GFRNONAA 49 (L) 12/30/2019 5409  ? GFRAA 56 (L) 12/30/2019 8119  ? ? ?Lipid Panel  ?   ?Component Value Date/Time  ? CHOL 154 12/30/2019 0821  ? TRIG 128 12/30/2019 0821  ? HDL 39 (L) 12/30/2019 1478  ? CHOLHDL 3.9 12/30/2019 0821  ? VLDL 23 07/20/2016 0821  ? [place] 93 12/30/2019 0821  ? LDLDIRECT 88.9 08/13/2006 1412  ? ? ? ?Imaging ?I have reviewed the images obtained: ? ?CT-scan of the brain- No acute intracranial hemorrhage or evidence of acute infarct. Aspect 10. Moderate to marked chronic microvascular ischemic changes with several age- indeterminate small vessel  infarcts.  ? ?CTA Head Neck- The common carotid, internal carotid and vertebral arteries are patent within the neck without hemodynamically significant stenosis. Mild atherosclerotic plaque about the carotid bifurcations and within the proximal ICAs.  Aortic Atherosclerosis. ?Moderate stenosis within the paraclinoid left ICA. Up to moderate stenosis within the distal cavernous/paraclinoid right ICA. Severe focal stenosis within a superior division mid M2 right MCA vessel. Severe focal stenosis within the distal A3 left ACA. Moderate focal stenosis within the distal A3 right ACA. ? ?MRI examination of the brain- 8 x 9 mm acute infarction at the right frontoparietal vertex ?primarily affecting the subcortical white matter. ? ?Assessment:  ?76 year old female with a PMH of glaucoma, CKD IIIa, DM2, HLD, and HTN presenting with speech changes post fall. MRI shows a right frontoparietal infarct. DAPT therapy initiated and stroke workup ordered.  ? ?Impression: ?Right frontoparietal vertex infract ? ?Recommendations: ?- HgbA1c, fasting lipid panel ?- MRI, MRA  of the brain without contrast ?- Frequent neuro checks ?- Echocardiogram ?- Carotid dopplers ?- Antiplatelet med: Aspirin '81mg'$  and Plavix '75mg'$  ?- Risk factor modification ?- Telemetry monitoring ?- PT consult, OT consult, Speech consult ?- Stroke team  to follow ? ? Patient seen and examined by NP/APP with MD. MD to update note as needed.  ? ?Janine Ores, DNP, FNP-BC ?[facility] ?Pager: [phone]  ? ?I have seen and reviewed the above n

## 2021-09-01 NOTE — ED Notes (Signed)
Phlebotomy called to notify this RN that they are coming to draw pt's trop ?

## 2021-09-01 NOTE — ED Notes (Signed)
Pt transported to MRI 

## 2021-09-01 NOTE — ED Notes (Signed)
Per pt, information can be given to and discussed with Belinda Block [phone] ?

## 2021-09-01 NOTE — H&P (Signed)
?History and Physical  ? ? ?Patient: Heather Gross DOB: 05/14/1945 ?DOA: 09/01/2021 ?DOS: the patient was seen and examined on 09/01/2021 ?PCP: No primary care provider on file.  ?Patient coming from: home via EMS ? ?Chief Complaint: Difficulty speaking ? ?HPI: Heather Gross is a 76 y.o. female with medical history significant of hypertension, hyperlipidemia, diabetes mellitus type 2, and CKD stage IIIa presents with complaints of difficulty speaking.  She had gone to bed at around 8 PM last night and woke up around 8 AM in her normal state of health.  However, around 11:30 AM-12PM she reports that she started not feeling well.  She had checked her blood pressure.  When she was trying to put away the blood pressure cuff lost her balance and went to the floor.  Denies any injury and was able to to get up without assistance.  She called her employer to let them know she was be able to make it into work today.  In doing so she recalls that she knew what she wanted to say, but the words were not coming out right.  Denies having any recent fevers, chills, chest pain, palpitations, nausea, vomiting, abdominal pain, diarrhea, or dysuria symptoms.  She takes a daily aspirin.  Due to the patient's change in speech her employer called 911.  Dr. Nyoka Cowden a family friend was able to find out that the patient had a similar event like this about one month ago that resolved which she was not evaluated by a doctor at that time. ? ?Upon admission into the emergency department patient was seen as a code stroke evaluated by neurology.  She was noted to be afebrile with respirations 16-25, blood pressures 127/65 to 153/69, and all other vital signs maintained.  CT scan of the head did not note any signs of an acute intercranial hemorrhage or evidence of acute stroke.  Labs significant for BUN 24, creatinine 1.36, and glucose 159.  Patient was not a candidate for thrombolytics and NIH scale was 0.  Patient has been given aspirin  and Plavix.  TRH called to admit for completion of TIA/stroke work-up. ? ?Review of Systems: As mentioned in the history of present illness. All other systems reviewed and are negative. ?Past Medical History:  ?Diagnosis Date  ? Glaucoma   ? Hyperlipidemia   ? Hypertension   ? ?Past Surgical History:  ?Procedure Laterality Date  ? BREAST BIOPSY Right 09/29/2015  ? BREAST EXCISIONAL BIOPSY Right 11/04/2015  ? papilloma  ? BREAST LUMPECTOMY WITH RADIOACTIVE SEED LOCALIZATION Right 11/04/2015  ? Procedure: BREAST LUMPECTOMY WITH RADIOACTIVE SEED LOCALIZATION;  Surgeon: Autumn Messing III, MD;  Location: [facility];  Service: General;  Laterality: Right;  ? LIPOMA EXCISION    ? ?Social History:  reports that she quit smoking about 31 years ago. She has never used smokeless tobacco. She reports that she does not drink alcohol and does not use drugs. ? ?Allergies  ?Allergen Reactions  ? Latex   ? Lipitor [Atorvastatin] Other (See Comments)  ?  Made mouth very dry and lips discolored  ? ? ?Family History  ?Problem Relation Age of Onset  ? Diabetes Sister   ? Breast cancer Sister   ? Diabetes Brother   ? Diabetes Sister   ? Diabetes Sister   ? Diabetes Sister   ? Diabetes Sister   ? ? ?Prior to Admission medications   ?Medication Sig Start Date End Date Taking? Authorizing Provider  ?amLODipine (NORVASC) 5 MG tablet  Take 1 tablet (5 mg total) by mouth daily. 01/08/19   Susy Frizzle, MD  ?ASPIRIN LOW DOSE 81 MG EC tablet TAKE 1 TABLET BY MOUTH EVERY DAY 04/06/20   Susy Frizzle, MD  ?Calcium 500 MG CHEW Chew 2 tablets (1,000 mg total) by mouth daily. 12/13/16   Dena Billet B, PA-C  ?[employer] D3 2000 units CAPS TAKE 1 CAPSULE BY MOUTH EVERY DAY 12/17/17   Dena Billet B, PA-C  ?fluticasone (FLONASE) 50 MCG/ACT nasal spray  06/06/19   [provider]  ?lisinopril (ZESTRIL) 10 MG tablet TAKE 1 TABLET BY MOUTH EVERY DAY 08/10/20   Alycia Rossetti, MD  ?metFORMIN (GLUCOPHAGE) 500 MG tablet TAKE 1 TABLET BY MOUTH AT  BEDTIME. 08/16/20   [place], Modena Nunnery, MD  ?Multiple Vitamin (MULTIVITAMIN) tablet Take 1 tablet by mouth daily.    [provider]  ?omeprazole (PRILOSEC) 20 MG capsule TAKE 1 CAPSULE BY MOUTH EVERY DAY 08/17/20   Susy Frizzle, MD  ?simvastatin (ZOCOR) 80 MG tablet TAKE 1 TABLET BY MOUTH EVERYDAY AT BEDTIME 09/09/20   Susy Frizzle, MD  ? ? ?Physical Exam: ?Vitals:  ? 09/01/21 1300 09/01/21 1416 09/01/21 1437  ?BP:  (!) 153/69 127/65  ?Pulse:  98 84  ?Resp:  16 (!) 25  ?Temp:   97.6 ?F (36.4 ?C)  ?TempSrc:   Oral  ?SpO2:   98%  ?Weight: 90.2 kg    ? ?Constitutional: Elderly female currently in no acute distress ?Eyes: PERRL, lids and conjunctivae normal ?ENMT: Mucous membranes are moist posterior pharynx clear of any exudate or lesions.  ?Neck: normal, supple, no masses, no thyromegaly ?Respiratory: clear to auscultation bilaterally, no wheezing, no crackles. Normal respiratory effort.   ?Cardiovascular: Regular rate and rhythm. No extremity edema. 2+ pedal pulses.  ?Abdomen: no tenderness, no masses palpated.   ?Musculoskeletal: no clubbing / cyanosis. No joint deformity upper and lower extremities. Good ROM, no contractures. Normal muscle tone.  ?Skin: no rashes, lesions, ulcers. No induration ?Neurologic: CN 2-12 grossly intact.  Patient's sensation appears to be intact.  Patient speech is relatively clear with infrequent pauses as though she is searching for words, but family feels like her speech is back to baseline. ?Psychiatric: Normal judgment and insight. Alert and oriented x 3. Normal mood.  ? ?Data Reviewed: ? ?Sinus rhythm 85 bpm with a left bundle branch block ? ?Assessment and Plan: ?Aphasia secondary to suspected TIA ?Patient presents with complaints of difficulty speaking starting this morning.  CT scan of the head did not note any acute abnormalities.  She is not a candidate for lytics as symptoms had seem to have resolved.  Risk factors include hypertension, hyperlipidemia, diabetes  mellitus type 2, and obesity. ?-Admit to a telemetry bed ?-TIA/stroke order set utilized ?-Neurochecks ?-Check MRI of the brain if able to be obtained as patient reports feet from bunion surgery ?-Check check lipid panel, hemoglobin A1c, and TSH ?-PT/OT to evaluate and treat ?-Continue aspirin and Plavix ?-Follow-up telemetry overnight ?-Appreciate neurology consultative services, we will follow-up for further recommendations ? ?Left bundle branch block ?Acute.  Not seen on previous imaging studies.  Patient denies any complaints of chest pain. ?-Follow-up echocardiogram ?-Consider need of formally consult cardiology if heart function seen to be abnormal ? ?Chronic kidney disease stage IIIa ?On admission creatinine 1.36 with BUN 26.  Baseline creatinine previously noted to be around 1.1-1.2 in 2021 ?-Normal saline IV fluids at 75 mL/h for 1 L ?-Recheck kidney function tomorrow  morning ? ?Essential hypertension ?Home blood pressure regimen includes lisinopril 40 mg daily and doxazosin 1 mg every morning. ?-Consider resuming home blood pressure regimen when medically appropriate ? ?Hyperlipidemia ?Home medication regimen includes simvastatin 80 mg nightly. ?-Pharmacy substitution of atorvastatin ? ?Diabetes mellitus type 2, well controlled ?Glucose 156 on admission.  Patient is not on any medications for treatment. ?-Check hemoglobin A1c ?-Heart healthy carb modified diet ?-Consider starting sliding scale of insulin at blood sugars noted to be greater than 180 ? ?Obesity ?BMI 32.9 ? ? Advance Care Planning:   Code Status: Full Code   ? ?Consults: Neurology ? ?Family Communication: Son updated at bedside ? ?Severity of Illness: ?The appropriate patient status for this patient is OBSERVATION. Observation status is judged to be reasonable and necessary in order to provide the required intensity of service to ensure the patient's safety. The patient's presenting symptoms, physical exam findings, and initial radiographic  and laboratory data in the context of their medical condition is felt to place them at decreased risk for further clinical deterioration. 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

## 2021-09-01 NOTE — Progress Notes (Signed)
?  Echocardiogram ?2D Echocardiogram has been performed. ? ?Heather Gross ?09/01/2021, 4:51 PM ?

## 2021-09-01 NOTE — Code Documentation (Signed)
Stroke Response Nurse Documentation ?Code Documentation ? ?Nyonna Hargrove is a 76 y.o. female arriving to [facility]  via [facility] on 09/01/2021 with past medical hx of HTN, Diabetes. On aspirin 81 mg daily. Code stroke was activated by EMS.  ? ?Patient from home where she was LKW at 1245 and now complaining of trouble speaking. Her husband saw her mopping at her baseline. At 1245, she reports not feeling well and calling her work to let them know she would not be in and she felt like her speech was different. Her work called EMS and they activated a Code Stroke due to aphasia. ? ?Stroke team at the bedside on patient arrival. Labs drawn and patient cleared for CT by EDP.  Patient to CT with team. NIHSS 0, see documentation for details and code stroke times. Patient is fully resolved upon arrival. The following imaging was completed:  CT Head and CTA. Patient is not a candidate for IV Thrombolytic due to being fully resolved. Patient is not a candidate for IR due to no LVO on imaging per MD Leonel Ramsay.  ? ?Care Plan: TIA Alert, q2 NIHSS/VS.  ? ?Bedside handoff with ED RN Reba.   ? ?Kathrin Greathouse  ?Stroke Response RN ? ? ?

## 2021-09-01 NOTE — ED Notes (Signed)
Pt back from MRI 

## 2021-09-01 NOTE — ED Provider Notes (Signed)
?[facility] ?Provider Note ? ? ?CSN: [id number] ?Arrival date & time: 09/01/21  1348 ? ?An emergency department physician performed an initial assessment on this suspected stroke patient at 1349. ? ?History ? ?No chief complaint on file. ? ? ?Heather Gross is a 76 y.o. female. ? ?76 year old female who had acute onset of aphasia about 3 hours prior to arrival.  She says she lost her balance and did fall but did not strike her head.  No loss of consciousness.  Denied any chest pain or shortness of breath prior to this.  No abdominal discomfort.  No unilateral weakness.  Was on the phone with the coworkers who called EMS and she presents as a code stroke ? ? ?  ? ?Home Medications ?Prior to Admission medications   ?Medication Sig Start Date End Date Taking? Authorizing Provider  ?amLODipine (NORVASC) 5 MG tablet Take 1 tablet (5 mg total) by mouth daily. 01/08/19   Susy Frizzle, MD  ?ASPIRIN LOW DOSE 81 MG EC tablet TAKE 1 TABLET BY MOUTH EVERY DAY 04/06/20   Susy Frizzle, MD  ?Calcium 500 MG CHEW Chew 2 tablets (1,000 mg total) by mouth daily. 12/13/16   Dena Billet B, PA-C  ?CVS D3 2000 units CAPS TAKE 1 CAPSULE BY MOUTH EVERY DAY 12/17/17   Dena Billet B, PA-C  ?fluticasone (FLONASE) 50 MCG/ACT nasal spray  06/06/19   [provider]  ?lisinopril (ZESTRIL) 10 MG tablet TAKE 1 TABLET BY MOUTH EVERY DAY 08/10/20   Alycia Rossetti, MD  ?metFORMIN (GLUCOPHAGE) 500 MG tablet TAKE 1 TABLET BY MOUTH AT BEDTIME. 08/16/20   [place], Modena Nunnery, MD  ?Multiple Vitamin (MULTIVITAMIN) tablet Take 1 tablet by mouth daily.    [provider]  ?omeprazole (PRILOSEC) 20 MG capsule TAKE 1 CAPSULE BY MOUTH EVERY DAY 08/17/20   Susy Frizzle, MD  ?simvastatin (ZOCOR) 80 MG tablet TAKE 1 TABLET BY MOUTH EVERYDAY AT BEDTIME 09/09/20   Susy Frizzle, MD  ?   ? ?Allergies    ?Latex and Lipitor [atorvastatin]   ? ?Review of Systems   ?Review of Systems  ?All other systems  reviewed and are negative. ? ?Physical Exam ?Updated Vital Signs ?BP (!) 153/69   Pulse 98   Resp 16   Wt 90.2 kg   BMI 33.09 kg/m?  ?Physical Exam ?Vitals and nursing note reviewed.  ?Constitutional:   ?   General: She is not in acute distress. ?   Appearance: Normal appearance. She is well-developed. She is not toxic-appearing.  ?HENT:  ?   Head: Normocephalic and atraumatic.  ?Eyes:  ?   General: Lids are normal.  ?   Conjunctiva/sclera: Conjunctivae normal.  ?   Pupils: Pupils are equal, round, and reactive to light.  ?Neck:  ?   Thyroid: No thyroid mass.  ?   Trachea: No tracheal deviation.  ?Cardiovascular:  ?   Rate and Rhythm: Normal rate and regular rhythm.  ?   Heart sounds: Normal heart sounds. No murmur heard. ?  No gallop.  ?Pulmonary:  ?   Effort: Pulmonary effort is normal. No respiratory distress.  ?   Breath sounds: Normal breath sounds. No stridor. No decreased breath sounds, wheezing, rhonchi or rales.  ?Abdominal:  ?   General: There is no distension.  ?   Palpations: Abdomen is soft.  ?   Tenderness: There is no abdominal tenderness. There is no rebound.  ?Musculoskeletal:     ?  General: No tenderness. Normal range of motion.  ?   Cervical back: Normal range of motion and neck supple.  ?Skin: ?   General: Skin is warm and dry.  ?   Findings: No abrasion or rash.  ?Neurological:  ?   Mental Status: She is alert and oriented to person, place, and time. Mental status is at baseline.  ?   GCS: GCS eye subscore is 4. GCS verbal subscore is 5. GCS motor subscore is 6.  ?   Cranial Nerves: No cranial nerve deficit.  ?   Sensory: No sensory deficit.  ?   Motor: Motor function is intact.  ?   Coordination: Coordination is intact.  ?Psychiatric:     ?   Attention and Perception: Attention normal.     ?   Speech: Speech normal.     ?   Behavior: Behavior normal.  ? ? ?ED Results / Procedures / Treatments   ?Labs ?(all labs ordered are listed, but only abnormal results are displayed) ?Labs Reviewed   ?DIFFERENTIAL - Abnormal; Notable for the following components:  ?    Result Value  ? Neutro Abs 8.0 (*)   ? All other components within normal limits  ?I-STAT CHEM 8, ED - Abnormal; Notable for the following components:  ? BUN 26 (*)   ? Creatinine, Ser 1.40 (*)   ? Glucose, Bld 156 (*)   ? Calcium, Ion 1.14 (*)   ? All other components within normal limits  ?CBG MONITORING, ED - Abnormal; Notable for the following components:  ? Glucose-Capillary 154 (*)   ? All other components within normal limits  ?RESP PANEL BY RT-PCR (FLU A&B, COVID) ARPGX2  ?PROTIME-INR  ?APTT  ?CBC  ?ETHANOL  ?COMPREHENSIVE METABOLIC PANEL  ?RAPID URINE DRUG SCREEN, HOSP PERFORMED  ?URINALYSIS, ROUTINE W REFLEX MICROSCOPIC  ? ? ?EKG ?EKG Interpretation ? ?Date/Time:  Thursday Sep 01 2021 14:19:22 EDT ?Ventricular Rate:  85 ?PR Interval:  169 ?QRS Duration: 144 ?QT Interval:  396 ?QTC Calculation: 471 ?R Axis:   42 ?Text Interpretation: Sinus rhythm Left bundle branch block Left bundle branch block is new from prior Confirmed by Lacretia Leigh ([id number]) on 09/01/2021 2:31:31 PM ? ?Radiology ?CT HEAD CODE STROKE WO CONTRAST ? ?Result Date: 09/01/2021 ?CLINICAL DATA:  Code stroke.  Slurred speech EXAM: CT HEAD WITHOUT CONTRAST TECHNIQUE: Contiguous axial images were obtained from the base of the skull through the vertex without intravenous contrast. RADIATION DOSE REDUCTION: This exam was performed according to the departmental dose-optimization program which includes automated exposure control, adjustment of the mA and/or kV according to patient size and/or use of iterative reconstruction technique. COMPARISON:  None Available. FINDINGS: Brain: There is no acute intracranial hemorrhage, mass effect, or edema. Prominence of the ventricles and sulci reflects parenchymal volume loss. Patchy and confluent areas low-density in the supratentorial white matter are nonspecific but probably reflect moderate to marked chronic microvascular ischemic changes.  There are age-indeterminate superimposed small vessel infarcts of the central white matter and deep gray nuclei. Age-indeterminate small vessel infarct of the right parasagittal pons. No extra-axial collection. Vascular: No hyperdense vessel. Skull: Unremarkable. Sinuses/Orbits: Minor mucosal thickening.  Orbits are unremarkable. Other: Mastoid air cells are clear. ASPECTS ([name] Stroke Program Early CT Score) - Ganglionic level infarction (caudate, lentiform nuclei, internal capsule, insula, M1-M3 cortex): 7 - Supraganglionic infarction (M4-M6 cortex): 3 Total score (0-10 with 10 being normal): 10 IMPRESSION: There is no acute intracranial hemorrhage or evidence of acute infarction. ASPECT score  is 10. Moderate to marked chronic microvascular ischemic changes with several age-indeterminate small vessel infarcts. These results were communicated to Dr. Leonel Ramsay at 2:04 pm on 09/01/2021 by text page via the [facility] messaging system. Electronically Signed   By: Macy Mis M.D.   On: 09/01/2021 14:08   ? ?Procedures ?Procedures  ? ? ?Medications Ordered in ED ?Medications  ?iohexol (OMNIPAQUE) 350 MG/ML injection 100 mL (75 mLs Intravenous Contrast Given 09/01/21 1410)  ? ? ?ED Course/ Medical Decision Making/ A&P ?  ?                        ?Medical Decision Making ?Amount and/or Complexity of Data Reviewed ?Labs: ordered. ?Radiology: ordered. ? ? ?Patient's EKG per my interpretation shows new left bundle branch block.  Patient has no chest pain and therefore do not feel that this is a STEMI equivalent.  We will add troponin to her work-up. ?Patient code stroke on arrival here and seen by Dr. Leonel Ramsay from neurology.  Had subsequent head CT which per my review and interpretation did not show any acute findings.  Patient's symptoms have completely resolved at this time.  Per neurology, feels that symptoms are likely from a TIA.  Recommends admission to the hospital service.  Will consult hospitalist for  admission ?ved. ? ? ? ? ? ? ? ?Final Clinical Impression(s) / ED Diagnoses ?Final diagnoses:  ?None  ? ? ?Rx / DC Orders ?ED Discharge Orders   ? ? None  ? ?  ? ? ?  ?Lacretia Leigh, MD ?09/01/21 1432 ? ?

## 2021-09-02 DIAGNOSIS — E669 Obesity, unspecified: Secondary | ICD-10-CM | POA: Diagnosis not present

## 2021-09-02 DIAGNOSIS — I639 Cerebral infarction, unspecified: Secondary | ICD-10-CM

## 2021-09-02 DIAGNOSIS — I10 Essential (primary) hypertension: Secondary | ICD-10-CM | POA: Diagnosis not present

## 2021-09-02 DIAGNOSIS — R4701 Aphasia: Secondary | ICD-10-CM | POA: Diagnosis not present

## 2021-09-02 DIAGNOSIS — E785 Hyperlipidemia, unspecified: Secondary | ICD-10-CM

## 2021-09-02 MED ORDER — ASPIRIN EC 81 MG PO TBEC: 81.0000 mg | DELAYED_RELEASE_TABLET | Freq: Every day | ORAL | Status: DC

## 2021-09-02 MED ORDER — ASPIRIN 81 MG PO TBEC: 81.0000 mg | DELAYED_RELEASE_TABLET | Freq: Every day | ORAL | 0 refills | Status: AC

## 2021-09-02 MED ORDER — CLOPIDOGREL BISULFATE 75 MG PO TABS: 75.0000 mg | ORAL_TABLET | Freq: Every day | ORAL | 3 refills | Status: DC

## 2021-09-02 NOTE — Evaluation (Addendum)
Speech Language Pathology Evaluation ?Patient Details ?Name: Heather Gross ?MRN: [id number] ?DOB: [birth year] ?Today's Date: 09/02/2021 ?Time: 9767-3419 ?SLP Time Calculation (min) (ACUTE ONLY): 23 min ? ?Problem List:  ?Patient Active Problem List  ? Diagnosis Date Noted  ? Acute ischemic stroke ([place]) 09/01/2021  ? LBBB (left bundle branch block) 09/01/2021  ? Class 1 obesity 12/30/2019  ? Stage 3a chronic kidney disease (CKD) ([place]) 02/05/2017  ? Osteopenia 11/02/2016  ? Vitamin D deficiency 12/10/2014  ? Hyperlipidemia 06/11/2014  ? Type 2 diabetes mellitus with diabetic chronic kidney disease ([place])   ? Glaucoma   ? THROMBOCYTOPENIA 06/28/2006  ? Hypertension 06/28/2006  ? GASTROESOPHAGEAL REFLUX, NO ESOPHAGITIS 06/28/2006  ? ECZEMA, ATOPIC DERMATITIS 06/28/2006  ? ?Past Medical History:  ?Past Medical History:  ?Diagnosis Date  ? Glaucoma   ? Hyperlipidemia   ? Hypertension   ? ?Past Surgical History:  ?Past Surgical History:  ?Procedure Laterality Date  ? BREAST BIOPSY Right 09/29/2015  ? BREAST EXCISIONAL BIOPSY Right 11/04/2015  ? papilloma  ? BREAST LUMPECTOMY WITH RADIOACTIVE SEED LOCALIZATION Right 11/04/2015  ? Procedure: BREAST LUMPECTOMY WITH RADIOACTIVE SEED LOCALIZATION;  Surgeon: Autumn Messing III, MD;  Location: [facility];  Service: General;  Laterality: Right;  ? LIPOMA EXCISION    ? ?HPI:  ?Heather Gross is a 76 y.o. female presented with complaints of difficulty speaking. MRI:  8 x 9 mm acute infarction at the right frontoparietal vertex  primarily affecting the subcortical white matter.Extensive chronic small-vessel ischemic changes elsewhere throughout  the brain. PHMx: hypertension, hyperlipidemia, diabetes mellitus type 2, and CKD stage IIIa.  ? ?Assessment / Plan / Recommendation ?Clinical Impression ? Pt reported that her presenting communication difficulties had resolved, so the SLUMS was administered. Although verbal expression was fairly fluent, pt scored 17/30 (score of 27 or above  considered to be [facility]), with impairments in comprehension noted throughout testing. For example, when asked what state we are in, she provided the country, and when asked the day of the week, she gave the month. She also exhibited decreased memory, selective attention, and problem solving which might be exacerbated by current comprehension abilities. Pt also shows little awareness to deficits throughout testing, even when errors are pointed out to her. 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 Recommend increasing supervision at home upon initial return home (lives with son and husband, with another son nearby) and OP SLP follow up. ?   ?SLP Assessment ? SLP Recommendation/Assessment: All further Speech Lanaguage Pathology  needs can be addressed in the next venue of care (pt discharging today) ?SLP Visit Diagnosis: Cognitive communication deficit (R41.841)  ?  ?Recommendations for follow up therapy are one component of a multi-disciplinary discharge planning process, led by the attending physician.  Recommendations may be updated based on patient status, additional functional criteria and insurance authorization. ?   ?Follow Up Recommendations ? Outpatient SLP  ?  ?Assistance Recommended at Discharge ? Frequent or constant Supervision/Assistance (upon initial return home)  ?Functional Status Assessment Patient has had a recent decline in their functional status and demonstrates the ability to make significant improvements in function in a reasonable and predictable amount of time.  ?Frequency and Duration    ?  ?  ?   ?SLP Evaluation ?Cognition ? Overall Cognitive Status: Impaired/Different from baseline ?Arousal/Alertness: Awake/alert ?Orientation Level: Oriented X4 ?Attention: Selective ?Selective Attention: Impaired ?Selective Attention Impairment: Verbal basic ?Memory: Impaired ?Memory Impairment: Decreased recall of  new information ?Awareness:  Impaired ?Awareness Impairment: Intellectual impairment;Emergent impairment;Anticipatory impairment ?Problem Solving: Impaired ?Problem Solving Impairment: Verbal basic ?Safety/Judgment: Impaired  ?  ?   ?Comprehension ? Auditory Comprehension ?Overall Auditory Comprehension: Impaired ?Commands: Impaired ?Complex Commands: 50-74% accurate ?Conversation: Simple ?Interfering Components: Processing speed;Working memory ?EffectiveTechniques: Visual/Gestural cues  ?  ?Expression Expression ?Primary Mode of Expression: Verbal ?Verbal Expression ?Overall Verbal Expression: Impaired (occasional paraphasias) ?Written Expression ?Dominant Hand: Right   ?Oral / Motor ? Motor Speech ?Overall Motor Speech: Appears within functional limits for tasks assessed   ?        ? ?Osie Bond., M.A. CCC-SLP ?Acute Rehabilitation Services ?Office [phone] ? ?Secure chat preferred ? ?09/02/2021, 2:28 PM ? ? ?

## 2021-09-02 NOTE — Discharge Summary (Signed)
?Physician Discharge Summary ?  ?Patient: Heather Gross MRN: [id number] DOB: 21-Nov-1945  ?Admit date:     09/01/2021  ?Discharge date: 09/02/21  ?Discharge Physician: Edwin Dada  ? ?PCP: [name], NP  ? ? ? ?Recommendations at discharge:  ?Follow up with PCP [name] in 1 week ?[name]: Please reconcile BP and diabetes medicines, patient unsure of names/doses, and titrate as needed for goal <130/80 mmHg ?Follow up with Speech Therapy, outpatient referral sent ?Follow up with Neurology in 6-8 weeks ?Change aspirin 81 to DAPT for 3 weeks then Plavix alone indefinitely ? ? ? ? ? ? ?Discharge Diagnoses: ?Principal Problem: ?  Acute ischemic stroke ([place]) ?Active Problems: ?  Hypertension ?  Hyperlipidemia ?  Type 2 diabetes mellitus with diabetic chronic kidney disease ([place]) ?  Stage 3a chronic kidney disease (CKD) ([place]) ?  Obesity, BMI 33 ? ?  ? ? ?Hospital Course: ?Heather Gross is a 76 y.o. F with diabetes, chronic kidney disease who presented with fall and difficulty speaking.  ? ?In the ER, symptoms had resolved.  CTH normal. ? ?MRI brain subsequently showed subcentimeter frontoparietal subcortical stroke.  Non-invasive angiography showed multifocal atherosclerotic disease, carotids <70%.  Echocardiogram showed no cardiogenic source of embolism. ? ?Lipids ordered: LDL 60, continued on high dose simvastatin. Aspirin ordered at admission --> discharged on aspirin 81 PLUS Plavix for 3 weeks then stop aspirin and Plavix alone indefinitely. ? ?Atrial fibrillation: not present on monitoring.  tPA not given because symptoms resolved. Dysphagia screen passed in ER, SLP ordered for cognitive therapy post discharge.  PT eval ordered: recommended no PT follow up. Non-smoker ? ? ? ? ? ?  ? ? ? ? ? ? ?Consultants: Neurology ?Procedures performed: Echo, CTA head and neck, MRI brain  ?Disposition: Home ?Diet recommendation:  ?Discharge Diet Orders (From admission, onward)  ? ?  Start     Ordered  ? 09/02/21 0000   Diet - low sodium heart healthy       ? 09/02/21 1448  ? ?  ?  ? ?  ? ? ? ?DISCHARGE MEDICATION: ?Allergies as of 09/02/2021   ? ?   Reactions  ? Lipitor [atorvastatin] Other (See Comments)  ? Made mouth very dry and the lips discolored  ? Shellfish-derived Products Other (See Comments)  ? Hands break out  ? Latex Rash  ? ?  ? ?  ?Medication List  ?  ? ?STOP taking these medications   ? ?omeprazole 20 MG capsule ?Commonly known as: PRILOSEC ?  ? ?  ? ?TAKE these medications   ? ?amLODipine 5 MG tablet ?Commonly known as: NORVASC ?Take 1 tablet (5 mg total) by mouth daily. ?  ?aspirin 81 MG EC tablet ?Take 1 tablet (81 mg total) by mouth daily for 21 days. Swallow whole. ?Start taking on: Sep 03, 2021 ?What changed:  ?how much to take ?additional instructions ?  ?Calcium 500 MG Chew ?Chew 2 tablets (1,000 mg total) by mouth daily. ?  ?clopidogrel 75 MG tablet ?Commonly known as: PLAVIX ?Take 1 tablet (75 mg total) by mouth daily. ?Start taking on: Sep 03, 2021 ?  ?[employer] D3 50 MCG (2000 UT) Caps ?Generic drug: Cholecalciferol ?TAKE 1 CAPSULE BY MOUTH EVERY DAY ?What changed:  ?how much to take ?when to take this ?  ?doxazosin 1 MG tablet ?Commonly known as: CARDURA ?Take 1 mg by mouth in the morning. ?  ?fluticasone 50 MCG/ACT nasal spray ?Commonly known as: FLONASE ?Place 1-2 sprays  into both nostrils daily as needed for allergies or rhinitis. ?  ?lisinopril 40 MG tablet ?Commonly known as: ZESTRIL ?Take 40 mg by mouth daily. ?What changed: Another medication with the same name was removed. Continue taking this medication, and follow the directions you see here. ?  ?metFORMIN 500 MG tablet ?Commonly known as: GLUCOPHAGE ?TAKE 1 TABLET BY MOUTH AT BEDTIME. ?  ?multivitamin tablet ?Take 1 tablet by mouth daily with breakfast. ?  ?simvastatin 80 MG tablet ?Commonly known as: ZOCOR ?TAKE 1 TABLET BY MOUTH EVERYDAY AT BEDTIME ?What changed: See the new instructions. ?  ? ?  ? ? Follow-up Information   ? ? [facility]. Go in 2 month(s).   ?Why: They will contact you for an appointment.  Call this number if you haven't heard from them in 1 week ?Contact information: ?[place]     [place] [id number] ?[phone] ? ?  ?  ? ? Your primary care physician. Schedule an appointment as soon as possible for a visit in 1 week(s).   ? ?  ?  ? ?  ?  ? ?  ? ? ?Discharge Instructions   ? ? Ambulatory referral to Neurology   Complete by: As directed ?  ? An appointment is requested in approximately: 8 weeks  ? Ambulatory referral to Speech Therapy   Complete by: As directed ?  ? For diagnosis acute stroke I63.9  ? For diagnosis acute stroke I63.9  ? Diet - low sodium heart healthy   Complete by: As directed ?  ? Discharge instructions   Complete by: As directed ?  ? From Dr. Loleta Books: ?You were admitted for a small stroke ?Here, we performed an MRI that showed a small stroke in the right frontoparietal lobes, which are areas involved in attention and thinking. ?Your CT angiogram showed the blood vessels of the neck and head have some atherosclerosis (fatty plaque) but nothing that needs to be "fixed". ?The treatment is continuing your simvastatin for cholesterol, switching from aspirin to Plavix, and adhering to a low sodium, low fat diet (the "DASH" diet). ? ?For the next 3 weeks: ?Take aspirin 81 mg AND clopidogrel/Plavix 75 mg ?After three weeks, stop the aspirin and just take the Plavix, from now on ?Make sure [name] gets refills for you ? ?IMPORTANT: Prilosec (omeprazole) the heart burn medicine, interacts with Plavix. ?STOP taking Prilosec and do not take it any more ?If you need a medicine for heart burn, take famotidine (which is Pepcid) ?If Pepcid doesn't work, ask [name] for an alternative ? ? ?Resume your home blood sugar and diabetes medicines ?Go see [name] in 1 week and ask her to adjust your blood pressure medicines and diabetes medicines as needed ? ?Go see the stroke  doctors Passenger transport manager) at [facility] in 8-12 weeks ? ?I sent a referral for Speech Therapy  ?If you have not heard from them in 1 week, ask [name] to send you another referral for outpatient speech therapy  ? Increase activity slowly   Complete by: As directed ?  ? ?  ? ? ?Discharge Exam: ?Filed Weights  ? 09/01/21 1300  ?Weight: 90.2 kg  ? ? ?General: Pt is alert, awake, not in acute distress ?Cardiovascular: RRR, nl S1-S2, no murmurs appreciated.   No LE edema.   ?Respiratory: Normal respiratory rate and rhythm.  CTAB without rales or wheezes. ?Abdominal: Abdomen soft and non-tender.  No distension or  HSM.   ?Neuro/Psych: Strength symmetric in upper and lower extremities.  Judgment and insight appear normal. ? ? ?Condition at discharge: good ? ?The results of significant diagnostics from this hospitalization (including imaging, microbiology, ancillary and laboratory) are listed below for reference.  ? ?Imaging Studies: ?MR BRAIN WO CONTRAST ? ?Result Date: 09/01/2021 ?CLINICAL DATA:  Stroke.  Follow-up. EXAM: MRI HEAD WITHOUT CONTRAST TECHNIQUE: Multiplanar, multiecho pulse sequences of the brain and surrounding structures were obtained without intravenous contrast. COMPARISON:  CT studies earlier same day FINDINGS: Brain: Diffusion imaging shows an 8 x 9 mm focus of acute infarction at the right frontoparietal vertex, primarily affecting the subcortical white matter. No large vessel territory stroke. No other acute insult. Old small vessel infarctions affect the pons. No focal cerebellar finding. Old small vessel infarctions affect the thalami. Chronic small-vessel ischemic changes are present throughout the cerebral hemispheric white matter. No evidence of intra-axial mass, hemorrhage, hydrocephalus or extra-axial collection. There is an extra-axial cyst measuring about 8 mm in diameter in the left posterior frontal region, not felt to be significant. Vascular: Major vessels at the base of  the brain show flow. Skull and upper cervical spine: Negative Sinuses/Orbits: Clear/normal Other: Small left mastoid effusion. IMPRESSION: 8 x 9 mm acute infarction at the right frontoparietal vertex prima

## 2021-09-02 NOTE — Progress Notes (Signed)
PT Cancellation Note ? ?Patient Details ?Name: Heather Gross ?MRN: [id number] ?DOB: 1945/08/13 ? ? ?Cancelled Treatment:    Reason Eval/Treat Not Completed: PT screened, no needs identified, will sign off. Per OT pt is independent and no PT needs. ? ? ?Shary Decamp [facility] ?09/02/2021, 9:01 AM ?Suanne Marker PT ?Acute Rehabilitation Services ?Office [phone] ? ? ? ?

## 2021-09-02 NOTE — Hospital Course (Signed)
Mrs. Gallego is a 76 y.o. F with diabetes, chronic kidney disease who presented with fall and difficulty speaking.  ? ?In the ER, symptoms had resolved.  CTH normal. ?

## 2021-09-02 NOTE — Progress Notes (Addendum)
STROKE TEAM PROGRESS NOTE  ? ?INTERVAL HISTORY ?No family at the bedside.  Patient states she is doing well today she feels a lot better.  She was able to work with therapy successfully and she is eager to go home.  She is agreeable to DAPT therapy and switching to Plavix.  Pharmacy updated in computer. ? ?Vitals:  ? 09/02/21 1100 09/02/21 1200 09/02/21 1320 09/02/21 1323  ?BP: (!) 148/91 132/61 (!) 140/58   ?Pulse: 79 87 76   ?Resp: (!) '21 18 20   '$ ?Temp:    98.1 ?F (36.7 ?C)  ?TempSrc:    Oral  ?SpO2: 97% 95% 97%   ?Weight:      ?Height:      ? ?CBC:  ?Recent Labs  ?Lab 09/01/21 ?1355 09/01/21 ?1357  ?WBC 10.5  --   ?NEUTROABS 8.0*  --   ?HGB 12.0 12.9  ?HCT 37.7 38.0  ?MCV 95.9  --   ?PLT 160  --   ? ?Basic Metabolic Panel:  ?Recent Labs  ?Lab 09/01/21 ?1355 09/01/21 ?1357  ?NA 138 140  ?K 4.0 3.9  ?CL 104 103  ?CO2 23  --   ?GLUCOSE 159* 156*  ?BUN 24* 26*  ?CREATININE 1.36* 1.40*  ?CALCIUM 9.5  --   ? ?Lipid Panel:  ?Recent Labs  ?Lab 09/01/21 ?1905  ?CHOL 114  ?TRIG 86  ?HDL 37*  ?CHOLHDL 3.1  ?VLDL 17  ?[place] 60  ? ?HgbA1c:  ?Recent Labs  ?Lab 09/01/21 ?1355  ?HGBA1C 6.5*  ? ?Urine Drug Screen:  ?Recent Labs  ?Lab 09/01/21 ?2224  ?LABOPIA NONE DETECTED  ?COCAINSCRNUR NONE DETECTED  ?LABBENZ NONE DETECTED  ?AMPHETMU NONE DETECTED  ?THCU NONE DETECTED  ?LABBARB NONE DETECTED  ?  ?Alcohol Level  ?Recent Labs  ?Lab 09/01/21 ?1349  ?ETH <10  ? ? ?IMAGING past 24 hours ?MR BRAIN WO CONTRAST ? ?Result Date: 09/01/2021 ?CLINICAL DATA:  Stroke.  Follow-up. EXAM: MRI HEAD WITHOUT CONTRAST TECHNIQUE: Multiplanar, multiecho pulse sequences of the brain and surrounding structures were obtained without intravenous contrast. COMPARISON:  CT studies earlier same day FINDINGS: Brain: Diffusion imaging shows an 8 x 9 mm focus of acute infarction at the right frontoparietal vertex, primarily affecting the subcortical white matter. No large vessel territory stroke. No other acute insult. Old small vessel infarctions affect the  pons. No focal cerebellar finding. Old small vessel infarctions affect the thalami. Chronic small-vessel ischemic changes are present throughout the cerebral hemispheric white matter. No evidence of intra-axial mass, hemorrhage, hydrocephalus or extra-axial collection. There is an extra-axial cyst measuring about 8 mm in diameter in the left posterior frontal region, not felt to be significant. Vascular: Major vessels at the base of the brain show flow. Skull and upper cervical spine: Negative Sinuses/Orbits: Clear/normal Other: Small left mastoid effusion. IMPRESSION: 8 x 9 mm acute infarction at the right frontoparietal vertex primarily affecting the subcortical white matter. Extensive chronic small-vessel ischemic changes elsewhere throughout the brain as outlined above. Electronically Signed   By: Nelson Chimes M.D.   On: 09/01/2021 18:40  ? ?ECHOCARDIOGRAM COMPLETE ? ?Result Date: 09/01/2021 ?   ECHOCARDIOGRAM REPORT   Patient Name:   Heather Gross Date of Exam: 09/01/2021 Medical Rec #:  [id number]      Height:       65.0 in Accession #:    [id number]     Weight:       198.9 lb Date of Birth:  1945-06-20       BSA:  1.973 m? Patient Age:    76 years       BP:           127/65 mmHg Patient Gender: F              HR:           84 bpm. Exam Location:  Inpatient Procedure: 2D Echo, Cardiac Doppler, Color Doppler and Intracardiac            Opacification Agent Indications:    TIA  History:        Patient has no prior history of Echocardiogram examinations.                 Risk Factors:Hypertension, Dyslipidemia, Diabetes and Former                 Smoker. CKD.  Sonographer:    Clayton Lefort RDCS (AE) Referring Phys: [id number] [facility] A SMITH  Sonographer Comments: Technically difficult study due to poor echo windows, suboptimal apical window and suboptimal subcostal window. IMPRESSIONS  1. Left ventricular ejection fraction, by estimation, is 55 to 60%. The left ventricle has normal function. The left ventricle has no  regional wall motion abnormalities. There is moderate left ventricular hypertrophy. Left ventricular diastolic parameters are indeterminate.  2. Right ventricular systolic function is normal. The right ventricular size is normal.  3. The mitral valve is normal in structure. No evidence of mitral valve regurgitation.  4. The aortic valve was not well visualized. Aortic valve regurgitation is not visualized.  5. The inferior vena cava not well vizualized. Comparison(s): No prior Echocardiogram. FINDINGS  Left Ventricle: Left ventricular ejection fraction, by estimation, is 55 to 60%. The left ventricle has normal function. The left ventricle has no regional wall motion abnormalities. Definity contrast agent was given IV to delineate the left ventricular  endocardial borders. The left ventricular internal cavity size was normal in size. There is moderate left ventricular hypertrophy. Left ventricular diastolic parameters are indeterminate. Right Ventricle: The right ventricular size is normal. No increase in right ventricular wall thickness. Right ventricular systolic function is normal. Left Atrium: Left atrial size was normal in size. Right Atrium: Right atrial size was normal in size. Pericardium: There is no evidence of pericardial effusion. Mitral Valve: The mitral valve is normal in structure. No evidence of mitral valve regurgitation. Tricuspid Valve: The tricuspid valve is not well visualized. Tricuspid valve regurgitation is trivial. Aortic Valve: The aortic valve was not well visualized. Aortic valve regurgitation is not visualized. Aortic valve mean gradient measures 3.0 mmHg. Aortic valve peak gradient measures 5.7 mmHg. Aortic valve area, by VTI measures 3.68 cm?. Pulmonic Valve: The pulmonic valve was not well visualized. Pulmonic valve regurgitation is not visualized. Aorta: The aortic root and ascending aorta are structurally normal, with no evidence of dilitation. Venous: The inferior vena cava not  well vizualized. IAS/Shunts: No atrial level shunt detected by color flow Doppler.  LEFT VENTRICLE PLAX 2D LVIDd:         4.40 cm LVIDs:         3.00 cm LV PW:         1.20 cm LV IVS:        1.50 cm LVOT diam:     2.20 cm LV SV:         82 LV SV Index:   42 LVOT Area:     3.80 cm?  RIGHT VENTRICLE RV Basal diam:  2.60 cm LEFT ATRIUM  Index        RIGHT ATRIUM           Index LA diam:        3.50 cm 1.77 cm/m?   RA Area:     14.20 cm? LA Vol (A2C):   36.9 ml 18.70 ml/m?  RA Volume:   33.20 ml  16.82 ml/m? LA Vol (A4C):   39.7 ml 20.12 ml/m? LA Biplane Vol: 40.2 ml 20.37 ml/m?  AORTIC VALVE AV Area (Vmax):    3.16 cm? AV Area (Vmean):   3.32 cm? AV Area (VTI):     3.68 cm? AV Vmax:           119.00 cm/s AV Vmean:          80.800 cm/s AV VTI:            0.224 m AV Peak Grad:      5.7 mmHg AV Mean Grad:      3.0 mmHg LVOT Vmax:         98.90 cm/s LVOT Vmean:        70.600 cm/s LVOT VTI:          0.217 m LVOT/AV VTI ratio: 0.97  AORTA Ao Root diam: 3.30 cm Ao Asc diam:  3.40 cm TRICUSPID VALVE TR Peak grad:   27.9 mmHg TR Vmax:        264.00 cm/s  SHUNTS Systemic VTI:  0.22 m Systemic Diam: 2.20 cm Phineas Inches Electronically signed by Phineas Inches Signature Date/Time: 09/01/2021/4:56:38 PM    Final   ? ?CT HEAD CODE STROKE WO CONTRAST ? ?Result Date: 09/01/2021 ?CLINICAL DATA:  Code stroke.  Slurred speech EXAM: CT HEAD WITHOUT CONTRAST TECHNIQUE: Contiguous axial images were obtained from the base of the skull through the vertex without intravenous contrast. RADIATION DOSE REDUCTION: This exam was performed according to the departmental dose-optimization program which includes automated exposure control, adjustment of the mA and/or kV according to patient size and/or use of iterative reconstruction technique. COMPARISON:  None Available. FINDINGS: Brain: There is no acute intracranial hemorrhage, mass effect, or edema. Prominence of the ventricles and sulci reflects parenchymal volume loss. Patchy and confluent  areas low-density in the supratentorial white matter are nonspecific but probably reflect moderate to marked chronic microvascular ischemic changes. There are age-indeterminate superimposed small vessel infa

## 2021-09-02 NOTE — Evaluation (Signed)
Occupational Therapy Evaluation and Discharge ?Patient Details ?Name: Heather Gross ?MRN: [id number] ?DOB: August 01, 1945 ?Today's Date: 09/02/2021 ? ? ?History of Present Illness Heather Gross is a 76 y.o. female presented with complaints of difficulty speaking. MRI:  8 x 9 mm acute infarction at the right frontoparietal vertex  primarily affecting the subcortical white matter.Extensive chronic small-vessel ischemic changes elsewhere throughout  the brain. PHMx: hypertension, hyperlipidemia, diabetes mellitus type 2, and CKD stage IIIa.  ? ?Clinical Impression ?  ?This 76 yo female was admitted with above. No deficits noted on eval from an ADL or mobility standpoint (made PT aware). No further OT needs, we will sign off.  ?   ? ?Recommendations for follow up therapy are one component of a multi-disciplinary discharge planning process, led by the attending physician.  Recommendations may be updated based on patient status, additional functional criteria and insurance authorization.  ? ?Follow Up Recommendations ? No OT follow up  ?  ?Assistance Recommended at Discharge PRN  ?Patient can return home with the following  (none) ? ?  ?Functional Status Assessment ? Patient has not had a recent decline in their functional status  ?Equipment Recommendations ? None recommended by OT  ?  ?   ?Precautions / Restrictions Precautions ?Precautions: None ?Restrictions ?Weight Bearing Restrictions: No  ? ?  ? ?Mobility Bed Mobility ?Overal bed mobility: Independent ?  ?  ?  ?  ?  ?  ?  ?  ? ?Transfers ?Overall transfer level: Independent ?Equipment used: None ?  ?  ?  ?  ?  ?  ?  ?General transfer comment: up and down one step without rail Mod I (increase time/careful); ambulated in hallway without AD and no LOB ?  ? ?  ?Balance Overall balance assessment: Independent ?  ?  ?  ?  ?  ?  ?  ?  ?  ?  ?  ?  ?  ?  ?  ?  ?  ?  ?   ? ?ADL either performed or assessed with clinical judgement  ? ?ADL Overall ADL's : Independent;Modified  independent ?  ?  ?  ?  ?  ?  ?  ?  ?  ?  ?  ?  ?  ?  ?  ?  ?  ?  ?  ?   ? ? ? ?Vision Baseline Vision/History: 1 Wears glasses ?Ability to See in Adequate Light: 0 Adequate ?Patient Visual Report: No change from baseline ? Vision tested--no deficits noted  ?   ?   ?   ? ?Pertinent Vitals/Pain Pain Assessment ?Pain Assessment: No/denies pain  ? ? ? ?Hand Dominance Right ?  ?Extremity/Trunk Assessment Upper Extremity Assessment ?Upper Extremity Assessment: Overall WFL for tasks assessed ?  ?Lower Extremity Assessment ?Lower Extremity Assessment: Overall WFL for tasks assessed ?  ?  ?  ?Communication Communication ?Communication: No difficulties ?  ?Cognition Arousal/Alertness: Awake/alert ?Behavior During Therapy: [facility] for tasks assessed/performed ?Overall Cognitive Status: Within Functional Limits for tasks assessed ?  ?  ?  ?  ?  ?  ?  ?  ?  ?  ?  ?  ?  ?  ?  ?  ?  ?  ?  ?   ?   ?   ? ? ?Home Living Family/patient expects to be discharged to:: Private residence ?Living Arrangements: Spouse/significant other ?Available Help at Discharge: Family;Available 24 hours/day ?Type of Home: House ?Home Access: Stairs to enter ?Entrance Stairs-Number of  Steps: 2 ?Entrance Stairs-Rails: None ?Home Layout: One level ?  ?  ?Bathroom Shower/Tub: Tub/shower unit ?  ?Bathroom Toilet: Handicapped height ?  ?  ?Home Equipment: Conservation officer, nature (2 wheels) ?  ?  ?  ? ?  ?Prior Functioning/Environment Prior Level of Function : Independent/Modified Independent;Driving ?  ?  ?  ?  ?  ?  ?  ?  ?  ? ?  ?  ?   ?   ?   ?OT Goals(Current goals can be found in the care plan section) Acute Rehab OT Goals ?Patient Stated Goal: to go home today  ?   ? ?   ?AM-PAC OT "6 Clicks" Daily Activity     ?Outcome Measure Help from another person eating meals?: None ?Help from another person taking care of personal grooming?: None ?Help from another person toileting, which includes using toliet, bedpan, or urinal?: None ?Help from another person bathing  (including washing, rinsing, drying)?: None ?Help from another person to put on and taking off regular upper body clothing?: None ?Help from another person to put on and taking off regular lower body clothing?: None ?6 Click Score: 24 ?  ?End of Session Nurse Communication:  (Good from OT and let PT know there was not need to see her she is independent to Mod I) ? ?Activity Tolerance: Patient tolerated treatment well ?Patient left: in bed ? ?OT Visit Diagnosis: Muscle weakness (generalized) (M62.81)  ?              ?Time: 0347-4259 ?OT Time Calculation (min): 32 min ?Charges:  OT General Charges ?$OT Visit: 1 Visit ?OT Evaluation ?$OT Eval Moderate Complexity: 1 Mod ?OT Treatments ?$Self Care/Home Management : 8-22 mins ? ?Heather Gross, OTR/L ?Acute Rehab Services ?Pager [phone] ?Office [phone] ? ? ? ?Heather Gross ?09/02/2021, 11:13 AM ?

## 2021-09-02 NOTE — Assessment & Plan Note (Signed)
Estimated body mass index is 33.58 kg/m as calculated from the following:   Height as of this encounter: 5' (1.524 m).   Weight as of this encounter: 78 kg.   -This will complicate overall prognosis 

## 2021-09-06 ENCOUNTER — Ambulatory Visit: Payer: Medicare HMO | Attending: Family Medicine | Admitting: Speech Pathology

## 2021-09-06 DIAGNOSIS — R41841 Cognitive communication deficit: Secondary | ICD-10-CM | POA: Insufficient documentation

## 2021-09-06 NOTE — Therapy (Signed)
?OUTPATIENT SPEECH LANGUAGE PATHOLOGY EVALUATION ? ? ?Patient Name: Heather Gross ?MRN: [id number] ?DOB:[birth year], 76 y.o., female ?Today's Date: 09/06/2021 ? ?PCP: [name] NP  ?REFERRING PROVIDER: Edwin Dada, MD  ? ? ? ?Past Medical History:  ?Diagnosis Date  ? Glaucoma   ? Hyperlipidemia   ? Hypertension   ? ?Past Surgical History:  ?Procedure Laterality Date  ? BREAST BIOPSY Right 09/29/2015  ? BREAST EXCISIONAL BIOPSY Right 11/04/2015  ? papilloma  ? BREAST LUMPECTOMY WITH RADIOACTIVE SEED LOCALIZATION Right 11/04/2015  ? Procedure: BREAST LUMPECTOMY WITH RADIOACTIVE SEED LOCALIZATION;  Surgeon: Autumn Messing III, MD;  Location: [facility];  Service: General;  Laterality: Right;  ? LIPOMA EXCISION    ? ?Patient Active Problem List  ? Diagnosis Date Noted  ? Acute ischemic stroke ([place]) 09/01/2021  ? LBBB (left bundle branch block) 09/01/2021  ? Class 1 obesity 12/30/2019  ? Stage 3a chronic kidney disease (CKD) ([place]) 02/05/2017  ? Osteopenia 11/02/2016  ? Vitamin D deficiency 12/10/2014  ? Hyperlipidemia 06/11/2014  ? Type 2 diabetes mellitus with diabetic chronic kidney disease ([place])   ? Glaucoma   ? THROMBOCYTOPENIA 06/28/2006  ? Hypertension 06/28/2006  ? GASTROESOPHAGEAL REFLUX, NO ESOPHAGITIS 06/28/2006  ? ECZEMA, ATOPIC DERMATITIS 06/28/2006  ? ? ?ONSET DATE: 09/01/21  ? ?REFERRING DIAG: I63.9 (ICD-10-CM) - Acute ischemic stroke ([place]) ? ?THERAPY DIAG:  ?No diagnosis found. ? ?SUBJECTIVE:  ? ?SUBJECTIVE STATEMENT: ?"Im doing much better" ? ?Pt accompanied by: self ? ?PERTINENT HISTORY: Aalia Greulich is a 76 y.o. female presented with complaints of difficulty speaking. MRI:  8 x 9 mm acute infarction at the right frontoparietal vertex  primarily affecting the subcortical white matter.Extensive chronic small-vessel ischemic changes elsewhere throughout  the brain. PHMx: hypertension, hyperlipidemia, diabetes mellitus type 2, and CKD stage IIIa.  ? ?PAIN:  ?Are you having pain?  No ? ? ?FALLS: Has patient fallen in last 6 months?  Yes ? ?LIVING ENVIRONMENT: ?Lives with: lives with their family ?Lives in: House/apartment ? ?PLOF:  ?Level of assistance: Independent with IADLs ?Employment: Part-time employment ? ? ?PATIENT GOALS "make sure everything is ok" ? ?OBJECTIVE:  ? ?DIAGNOSTIC FINDINGS: MR BRAIN WO CONTRAST FINDINGS: Brain: Diffusion imaging shows an 8 x 9 mm focus of acute infarction ?at the right frontoparietal vertex, primarily affecting the ?subcortical white matter. No large vessel territory stroke. No other ?acute insult. Old small vessel infarctions affect the pons. No focal ?cerebellar finding. Old small vessel infarctions affect the thalami. ?Chronic small-vessel ischemic changes are present throughout the ?cerebral hemispheric white matter. No evidence of intra-axial mass, ?hemorrhage, hydrocephalus or extra-axial collection. There is an ?extra-axial cyst measuring about 8 mm in diameter in the left ?posterior frontal region, not felt to be significant. ? ?COGNITION: ?Overall cognitive status: Within functional limits for tasks assessed ? ?COGNITIVE COMMUNICATION ?Following directions: Follows multi-step commands consistently  ?Auditory comprehension: WFL ?Verbal expression: WFL ?Functional communication: WFL ? ?ORAL MOTOR EXAMINATION ?WFL ? ?STANDARDIZED ASSESSMENTS: ?SLUMS: 24/26, improvement from baseline SLUMS administration during IP stay ? ? ?TODAY'S TREATMENT:  ?Education on results of evaluation, ST recommendations. Eduction on general attention.memory compensations/supports as pt returns to regular daily activities to include work.  ? ? ?PATIENT EDUCATION: ?Education details: see above ?Person educated: Patient ?Education method: Explanation and Demonstration ?Education comprehension: verbalized understanding and returned demonstration ? ? ?ASSESSMENT: ? ?CLINICAL IMPRESSION: ?Patient is a 76 y.o. F who was seen today for cognitive linguistic changes s/p ischemic  stroke. Pt reports no ongoing difficulties  with speech, language, or thinking skills following d/c from hospital. Was able to successfully pay bills yesterday, has returned to driving, watching sports (reports being able to follow), cooking, having successful conversations with friends and family. Managing schedule and medications.  SLUMS indicates mild neurocognitive disorder, however pt w/o functional complaints. Improvement noted from IP administration in which pt scored a 17. Demonstrates consistent awareness of erroneous responses, such as with mental math subtraction.  ? ?OBJECTIVE IMPAIRMENTS include executive functioning. These impairments are limiting patient from  none reported . ? ?REHAB POTENTIAL: Good ? ?PLAN: ?SLP FREQUENCY: one time visit ? ?SLP DURATION: eval + discharge ? ?PLANNED INTERVENTIONS: SLP instruction and feedback and Patient/family education ? ? ? ?Su Monks, CCC-SLP ?09/06/2021, 9:50 AM ? ? ? ?  ?

## 2021-09-13 ENCOUNTER — Other Ambulatory Visit: Payer: Self-pay

## 2021-09-13 ENCOUNTER — Emergency Department (HOSPITAL_COMMUNITY): Payer: Medicare HMO

## 2021-09-13 ENCOUNTER — Encounter (HOSPITAL_COMMUNITY): Payer: Self-pay | Admitting: Emergency Medicine

## 2021-09-13 ENCOUNTER — Observation Stay (HOSPITAL_COMMUNITY): Payer: Medicare HMO

## 2021-09-13 ENCOUNTER — Observation Stay (HOSPITAL_COMMUNITY)
Admission: EM | Admit: 2021-09-13 | Discharge: 2021-09-14 | Disposition: A | Discharge status: Home / Self Care | Payer: Medicare HMO | Attending: Family Medicine | Admitting: Family Medicine

## 2021-09-13 DIAGNOSIS — Z6834 Body mass index (BMI) 34.0-34.9, adult: Secondary | ICD-10-CM | POA: Diagnosis not present

## 2021-09-13 DIAGNOSIS — I672 Cerebral atherosclerosis: Secondary | ICD-10-CM | POA: Insufficient documentation

## 2021-09-13 DIAGNOSIS — G459 Transient cerebral ischemic attack, unspecified: Principal | ICD-10-CM | POA: Insufficient documentation

## 2021-09-13 DIAGNOSIS — E1122 Type 2 diabetes mellitus with diabetic chronic kidney disease: Secondary | ICD-10-CM | POA: Diagnosis not present

## 2021-09-13 DIAGNOSIS — Z91040 Latex allergy status: Secondary | ICD-10-CM | POA: Insufficient documentation

## 2021-09-13 DIAGNOSIS — R4701 Aphasia: Secondary | ICD-10-CM | POA: Insufficient documentation

## 2021-09-13 DIAGNOSIS — R29898 Other symptoms and signs involving the musculoskeletal system: Secondary | ICD-10-CM | POA: Diagnosis not present

## 2021-09-13 DIAGNOSIS — Z7982 Long term (current) use of aspirin: Secondary | ICD-10-CM | POA: Diagnosis not present

## 2021-09-13 DIAGNOSIS — H409 Unspecified glaucoma: Secondary | ICD-10-CM | POA: Diagnosis not present

## 2021-09-13 DIAGNOSIS — D649 Anemia, unspecified: Secondary | ICD-10-CM | POA: Insufficient documentation

## 2021-09-13 DIAGNOSIS — Z79899 Other long term (current) drug therapy: Secondary | ICD-10-CM | POA: Insufficient documentation

## 2021-09-13 DIAGNOSIS — R2990 Unspecified symptoms and signs involving the nervous system: Secondary | ICD-10-CM

## 2021-09-13 DIAGNOSIS — I63523 Cerebral infarction due to unspecified occlusion or stenosis of bilateral anterior cerebral arteries: Secondary | ICD-10-CM | POA: Insufficient documentation

## 2021-09-13 DIAGNOSIS — Z833 Family history of diabetes mellitus: Secondary | ICD-10-CM | POA: Diagnosis not present

## 2021-09-13 DIAGNOSIS — I1310 Hypertensive heart and chronic kidney disease without heart failure, with stage 1 through stage 4 chronic kidney disease, or unspecified chronic kidney disease: Secondary | ICD-10-CM | POA: Insufficient documentation

## 2021-09-13 DIAGNOSIS — E669 Obesity, unspecified: Secondary | ICD-10-CM | POA: Diagnosis present

## 2021-09-13 DIAGNOSIS — R29818 Other symptoms and signs involving the nervous system: Secondary | ICD-10-CM | POA: Insufficient documentation

## 2021-09-13 DIAGNOSIS — K219 Gastro-esophageal reflux disease without esophagitis: Secondary | ICD-10-CM | POA: Diagnosis present

## 2021-09-13 DIAGNOSIS — I63511 Cerebral infarction due to unspecified occlusion or stenosis of right middle cerebral artery: Secondary | ICD-10-CM | POA: Insufficient documentation

## 2021-09-13 DIAGNOSIS — Z8673 Personal history of transient ischemic attack (TIA), and cerebral infarction without residual deficits: Secondary | ICD-10-CM | POA: Diagnosis not present

## 2021-09-13 DIAGNOSIS — R4781 Slurred speech: Secondary | ICD-10-CM | POA: Diagnosis not present

## 2021-09-13 DIAGNOSIS — N1831 Chronic kidney disease, stage 3a: Secondary | ICD-10-CM | POA: Diagnosis present

## 2021-09-13 DIAGNOSIS — M47812 Spondylosis without myelopathy or radiculopathy, cervical region: Secondary | ICD-10-CM | POA: Diagnosis not present

## 2021-09-13 DIAGNOSIS — I447 Left bundle-branch block, unspecified: Secondary | ICD-10-CM | POA: Diagnosis not present

## 2021-09-13 DIAGNOSIS — N179 Acute kidney failure, unspecified: Secondary | ICD-10-CM | POA: Insufficient documentation

## 2021-09-13 DIAGNOSIS — E785 Hyperlipidemia, unspecified: Secondary | ICD-10-CM | POA: Diagnosis not present

## 2021-09-13 DIAGNOSIS — I10 Essential (primary) hypertension: Secondary | ICD-10-CM | POA: Diagnosis present

## 2021-09-13 DIAGNOSIS — Z7902 Long term (current) use of antithrombotics/antiplatelets: Secondary | ICD-10-CM | POA: Diagnosis not present

## 2021-09-13 DIAGNOSIS — I700 Atherosclerosis of aorta: Secondary | ICD-10-CM | POA: Insufficient documentation

## 2021-09-13 DIAGNOSIS — Z7984 Long term (current) use of oral hypoglycemic drugs: Secondary | ICD-10-CM | POA: Diagnosis not present

## 2021-09-13 DIAGNOSIS — Z87891 Personal history of nicotine dependence: Secondary | ICD-10-CM | POA: Diagnosis not present

## 2021-09-13 DIAGNOSIS — I679 Cerebrovascular disease, unspecified: Secondary | ICD-10-CM | POA: Insufficient documentation

## 2021-09-13 DIAGNOSIS — I63233 Cerebral infarction due to unspecified occlusion or stenosis of bilateral carotid arteries: Secondary | ICD-10-CM | POA: Diagnosis not present

## 2021-09-13 DIAGNOSIS — I129 Hypertensive chronic kidney disease with stage 1 through stage 4 chronic kidney disease, or unspecified chronic kidney disease: Secondary | ICD-10-CM | POA: Insufficient documentation

## 2021-09-13 DIAGNOSIS — Z6839 Body mass index (BMI) 39.0-39.9, adult: Secondary | ICD-10-CM | POA: Insufficient documentation

## 2021-09-13 DIAGNOSIS — I639 Cerebral infarction, unspecified: Secondary | ICD-10-CM | POA: Diagnosis present

## 2021-09-13 DIAGNOSIS — N1832 Chronic kidney disease, stage 3b: Secondary | ICD-10-CM | POA: Insufficient documentation

## 2021-09-13 LAB — COMPREHENSIVE METABOLIC PANEL
ALT: 13 U/L (ref 0–44)
AST: 22 U/L (ref 15–41)
Albumin: 4 g/dL (ref 3.5–5.0)
Alkaline Phosphatase: 50 U/L (ref 38–126)
Anion gap: 9 (ref 5–15)
BUN: 21 mg/dL (ref 8–23)
CO2: 24 mmol/L (ref 22–32)
Calcium: 9.5 mg/dL (ref 8.9–10.3)
Chloride: 107 mmol/L (ref 98–111)
Creatinine, Ser: 1.63 mg/dL — ABNORMAL HIGH (ref 0.44–1.00)
GFR, Estimated: 33 mL/min — ABNORMAL LOW (ref >60)
Glucose, Bld: 125 mg/dL — ABNORMAL HIGH (ref 70–99)
Potassium: 4.3 mmol/L (ref 3.5–5.1)
Sodium: 140 mmol/L (ref 135–145)
Total Bilirubin: 0.2 mg/dL — ABNORMAL LOW (ref 0.3–1.2)
Total Protein: 7.1 g/dL (ref 6.5–8.1)

## 2021-09-13 LAB — CBG MONITORING, ED: Glucose-Capillary: 122 mg/dL — ABNORMAL HIGH (ref 70–99)

## 2021-09-13 LAB — CBC
HCT: 36.5 % (ref 36.0–46.0)
Hemoglobin: 11.9 g/dL — ABNORMAL LOW (ref 12.0–15.0)
MCH: 31.4 pg (ref 26.0–34.0)
MCHC: 32.6 g/dL (ref 30.0–36.0)
MCV: 96.3 fL (ref 80.0–100.0)
Platelets: 157 10*3/uL (ref 150–400)
RBC: 3.79 MIL/uL — ABNORMAL LOW (ref 3.87–5.11)
RDW: 14.3 % (ref 11.5–15.5)
WBC: 6.7 10*3/uL (ref 4.0–10.5)
nRBC: 0 % (ref 0.0–0.2)

## 2021-09-13 LAB — AMMONIA: Ammonia: 17 umol/L (ref 9–35)

## 2021-09-13 LAB — DIFFERENTIAL
Abs Immature Granulocytes: 0.02 10*3/uL (ref 0.00–0.07)
Basophils Absolute: 0.1 10*3/uL (ref 0.0–0.1)
Basophils Relative: 1 %
Eosinophils Absolute: 0.2 10*3/uL (ref 0.0–0.5)
Eosinophils Relative: 2 %
Immature Granulocytes: 0 %
Lymphocytes Relative: 25 %
Lymphs Abs: 1.7 10*3/uL (ref 0.7–4.0)
Monocytes Absolute: 0.4 10*3/uL (ref 0.1–1.0)
Monocytes Relative: 6 %
Neutro Abs: 4.4 10*3/uL (ref 1.7–7.7)
Neutrophils Relative %: 66 %

## 2021-09-13 LAB — URINALYSIS, ROUTINE W REFLEX MICROSCOPIC
Bilirubin Urine: NEGATIVE
Glucose, UA: NEGATIVE mg/dL
Ketones, ur: NEGATIVE mg/dL
Leukocytes,Ua: NEGATIVE
Nitrite: NEGATIVE
Protein, ur: NEGATIVE mg/dL
Specific Gravity, Urine: 1.039 — ABNORMAL HIGH (ref 1.005–1.030)
pH: 6 (ref 5.0–8.0)

## 2021-09-13 LAB — TROPONIN I (HIGH SENSITIVITY)
Troponin I (High Sensitivity): 8 ng/L (ref <18)
Troponin I (High Sensitivity): 8 ng/L (ref <18)

## 2021-09-13 LAB — I-STAT CHEM 8, ED
BUN: 25 mg/dL — ABNORMAL HIGH (ref 8–23)
Calcium, Ion: 1.13 mmol/L — ABNORMAL LOW (ref 1.15–1.40)
Chloride: 105 mmol/L (ref 98–111)
Creatinine, Ser: 1.5 mg/dL — ABNORMAL HIGH (ref 0.44–1.00)
Glucose, Bld: 125 mg/dL — ABNORMAL HIGH (ref 70–99)
HCT: 37 % (ref 36.0–46.0)
Hemoglobin: 12.6 g/dL (ref 12.0–15.0)
Potassium: 4.2 mmol/L (ref 3.5–5.1)
Sodium: 140 mmol/L (ref 135–145)
TCO2: 25 mmol/L (ref 22–32)

## 2021-09-13 LAB — PROTIME-INR
INR: 0.9 (ref 0.8–1.2)
Prothrombin Time: 12.4 seconds (ref 11.4–15.2)

## 2021-09-13 LAB — ETHANOL: Alcohol, Ethyl (B): <10 mg/dL (ref <10)

## 2021-09-13 LAB — APTT: aPTT: 28 seconds (ref 24–36)

## 2021-09-13 IMAGING — CT CT HEAD CODE STROKE
4 series · 16 of 47 positions shown, 18 images · non-contrast
Comparison: [DATE]

CLINICAL DATA: Code stroke.  Neuro deficit, acute, stroke suspected



[Series 2: head 5.0 (person_name) (person_name) · axial · 0.42mm/px · z∈[-95,+25]mm · 7 of 32 slices shown, 9 images]
[im 4/32  brain]
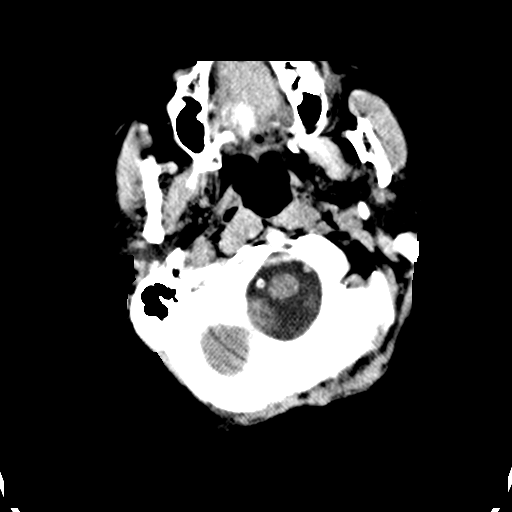
[im 4/32  bone]
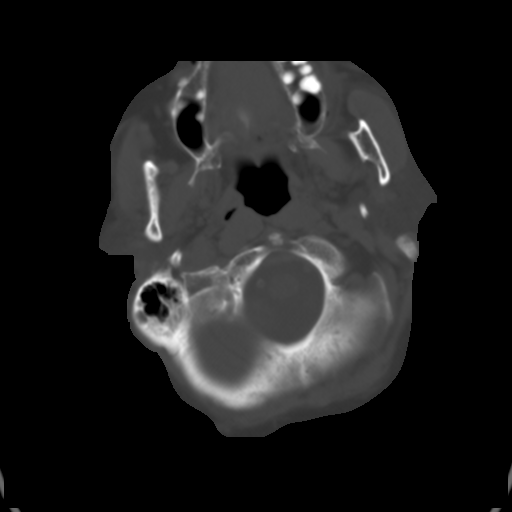
[im 8/32  brain]
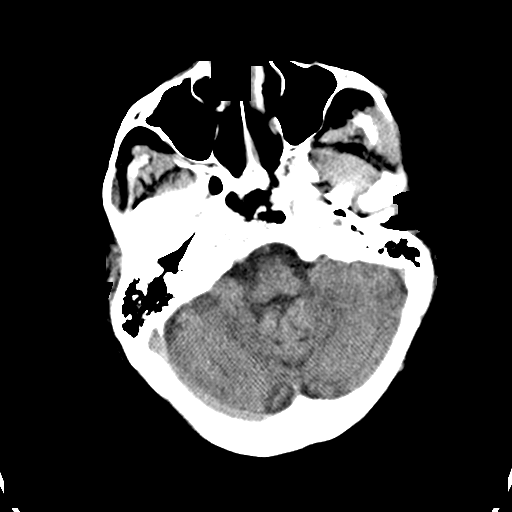
[im 12/32  brain]
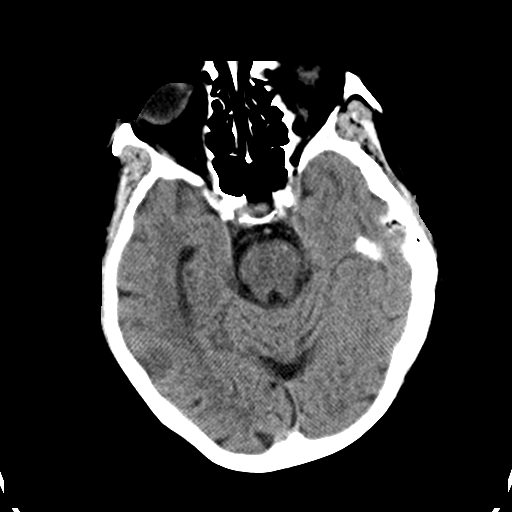
[im 16/32  brain]
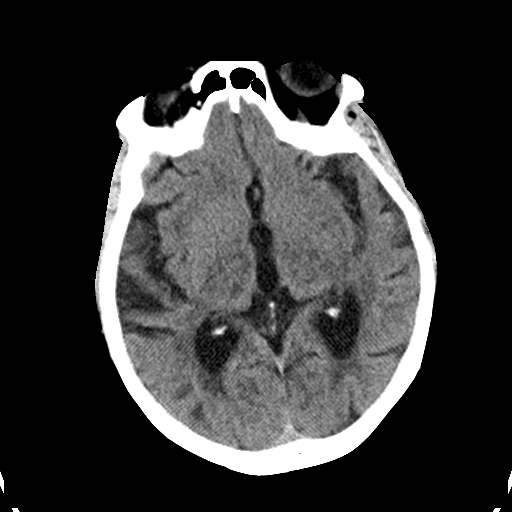
[im 20/32  brain]
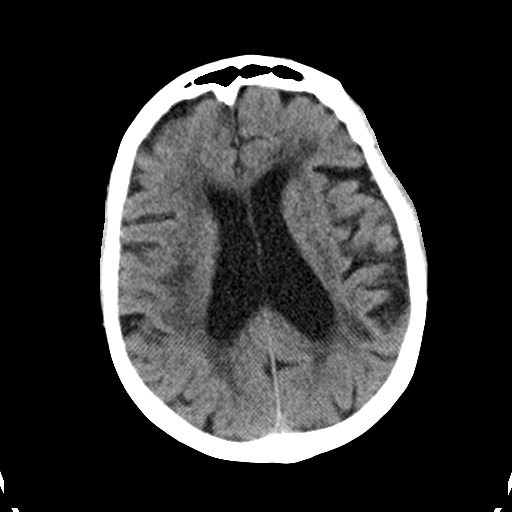
[im 20/32  bone]
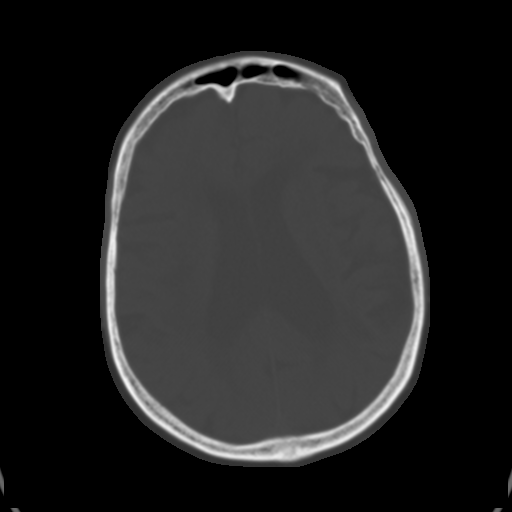
[im 24/32  brain]
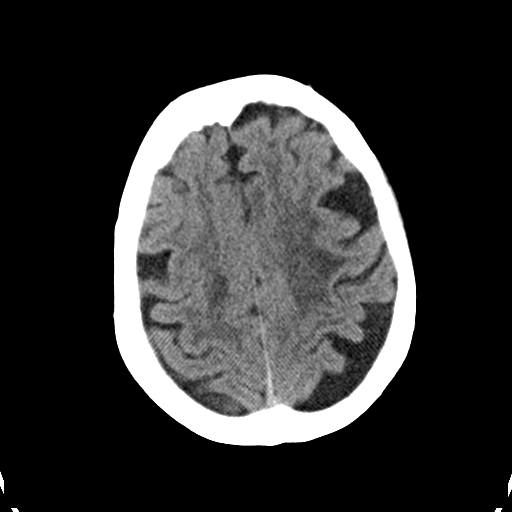
[im 28/32  brain]
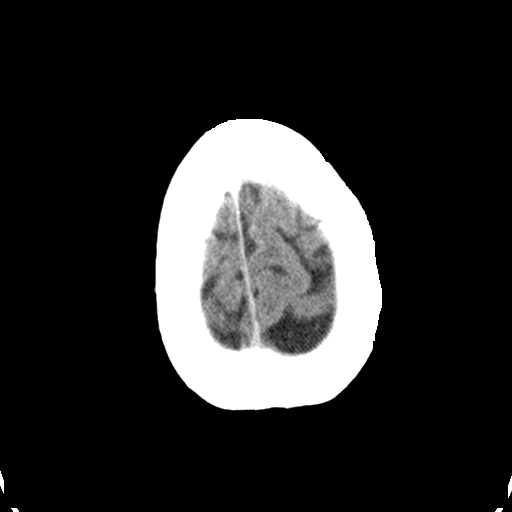

[Series 4: head 2.0 bone · axial · 0.42mm/px · z∈[-96,-64]mm · 3 of 80 slices shown]
[im 8/80  bone]
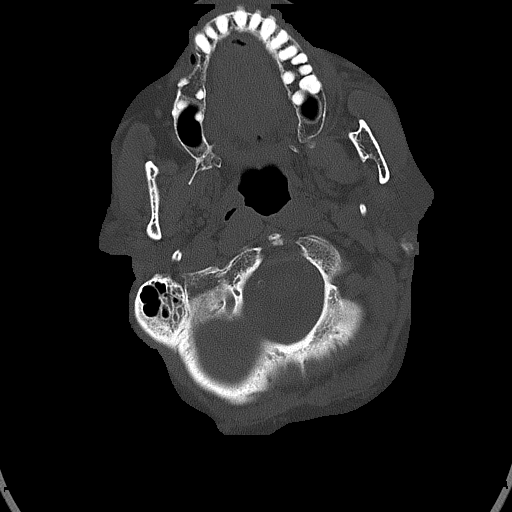
[im 16/80  bone]
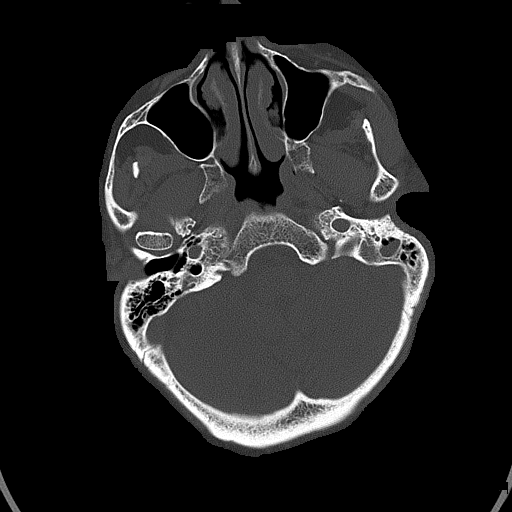
[im 24/80  bone]
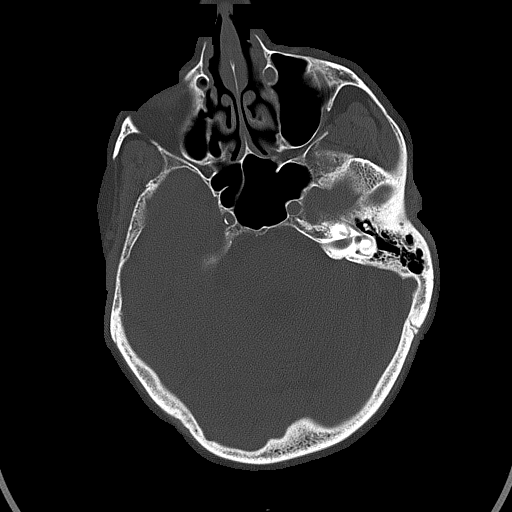

[Series 5: head 3.0 cor st · coronal · 0.30mm/px · 3 of 67 slices shown]
[im 23/67  brain]
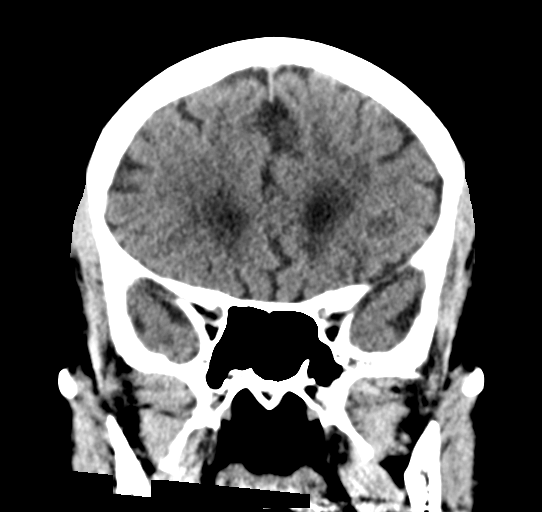
[im 30/67  brain]
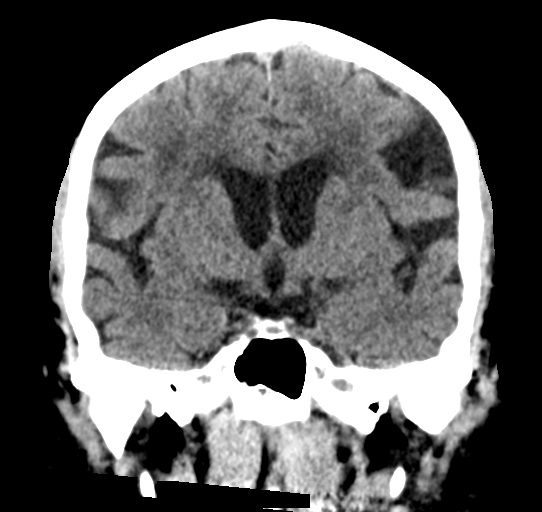
[im 37/67  brain]
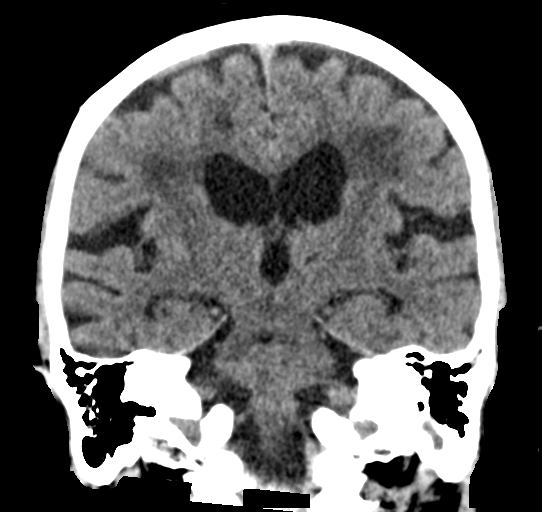

[Series 6: head 3.0 sag st · sagittal · 0.30mm/px · 3 of 55 slices shown]
[im 19/55  brain]
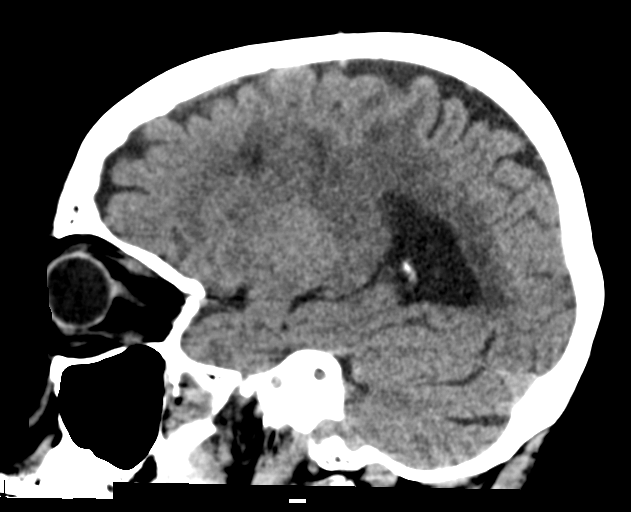
[im 28/55  brain]
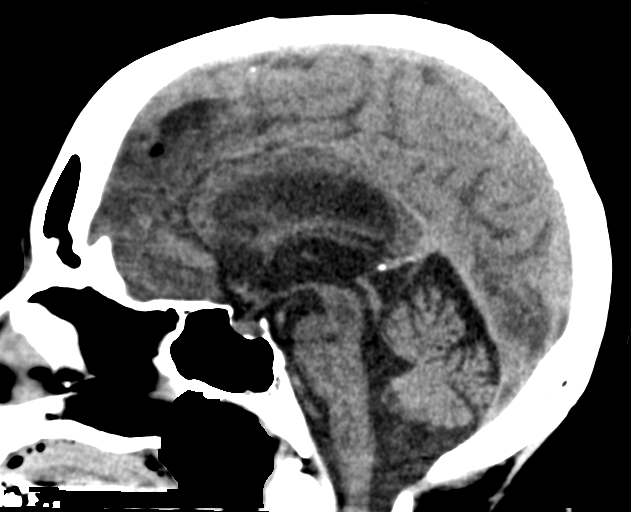
[im 37/55  brain]
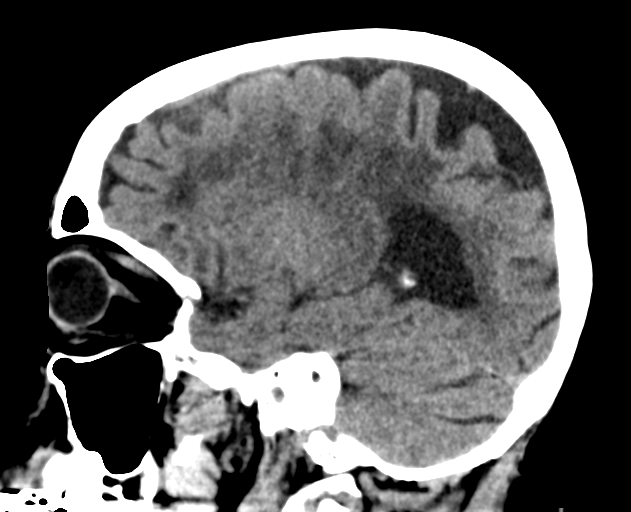

[16 of 47 positions shown; findings below may reference images not displayed]

FINDINGS: Brain: No acute intracranial hemorrhage, mass effect, or edema. No
new loss of gray-white differentiation. The small recent infarct on
MRI brain is not well seen on CT. Several chronic small vessel
infarcts are again identified with involvement of the central white
matter and deep gray nuclei. There is a chronic small vessel infarct
of the right parasagittal pons. Extensive chronic microvascular
ischemic changes are again noted

Vascular: No hyperdense vessel. Intracranial atherosclerotic
calcification is present at the skull base.

Skull: Unremarkable.

Sinuses/Orbits: No acute finding.

Other: None.

ASPECTS (Alberta Stroke Program Early CT Score)

- Ganglionic level infarction (caudate, lentiform nuclei, internal
capsule, insula, M1-M3 cortex): 7

- Supraganglionic infarction (M4-M6 cortex): 3

Total score (0-10 with 10 being normal): 10
IMPRESSION: There is no acute intracranial hemorrhage or evidence of acute
infarction. ASPECT score is 10.

These results were communicated to Dr. LONGS at [DATE] on
[DATE] by text page via the [employer] messaging system.

## 2021-09-13 IMAGING — MR MR HEAD W/O CM
11 of 12 series · 43 of 48 positions shown · non-contrast
Comparison: [DATE]

CLINICAL DATA: Transient ischemic attack

EXAM:
MRI HEAD WITHOUT CONTRAST
TECHNIQUE: Multiplanar, multiecho pulse sequences of the brain and surrounding
structures were obtained without intravenous contrast.

[Series 5: DWI · axial · 3.0mm · 0.88mm/px · z∈[-97,+50]mm · 7 of 100 slices shown (1 of 4)]
[im 1/100]
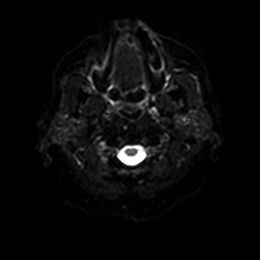
[im 17/100]
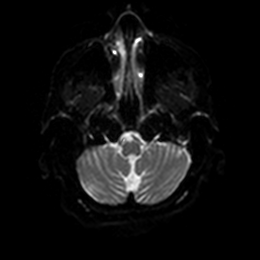
[im 34/100]
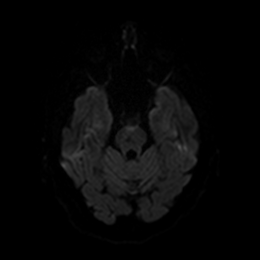
[im 50/100]
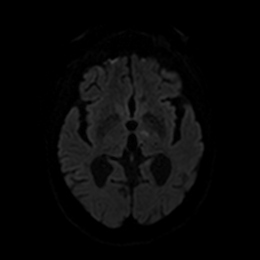
[im 67/100]
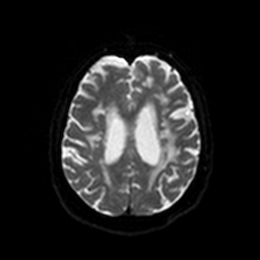
[im 83/100]
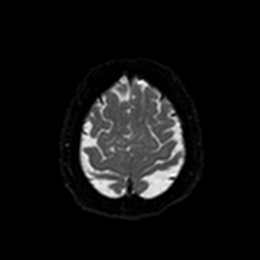
[im 100/100]
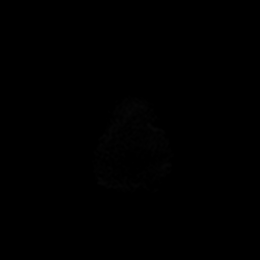

[Series 6: DWI · axial · 3.0mm · 0.88mm/px · z∈[-97,+50]mm · 4 of 50 slices shown (2 of 4)]
[im 1/50]
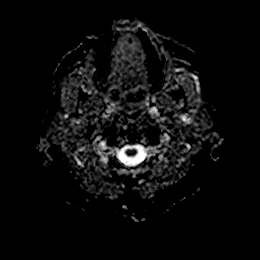
[im 17/50]
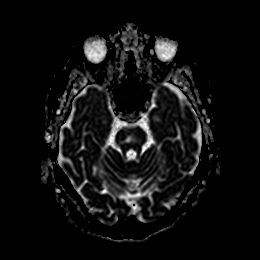
[im 33/50]
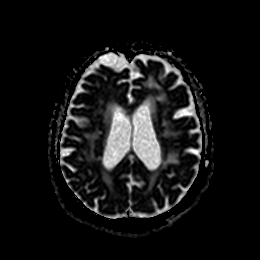
[im 50/50]
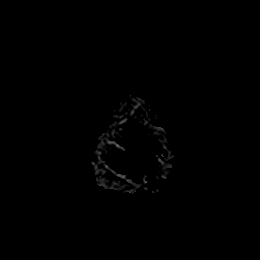

[Series 7: DWI · coronal · 4.0mm · 0.88mm/px · 6 of 70 slices shown (3 of 4)]
[im 1/70]
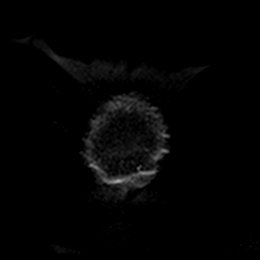
[im 14/70]
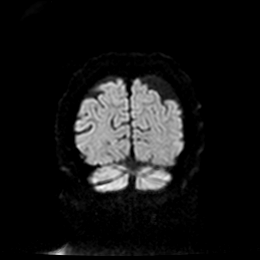
[im 28/70]
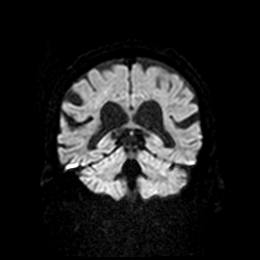
[im 42/70]
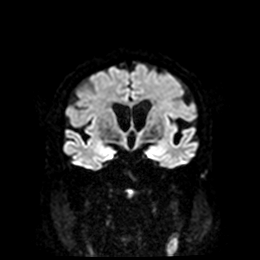
[im 56/70]
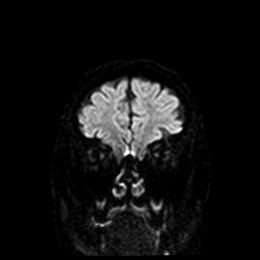
[im 70/70]
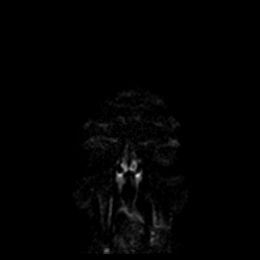

[Series 8: DWI · coronal · 4.0mm · 0.88mm/px · 3 of 35 slices shown (4 of 4)]
[im 1/35]
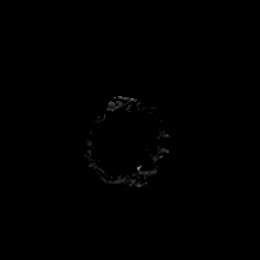
[im 18/35]
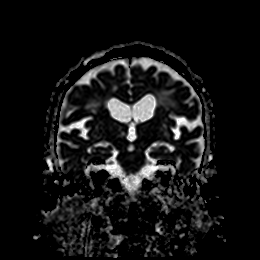
[im 35/35]
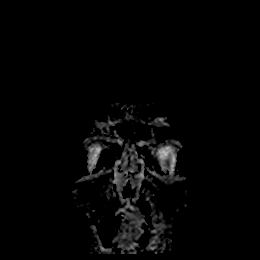

[Series 9: T1 · sagittal · 5.0mm · 0.75mm/px · 2 of 25 slices shown]
[im 1/25]
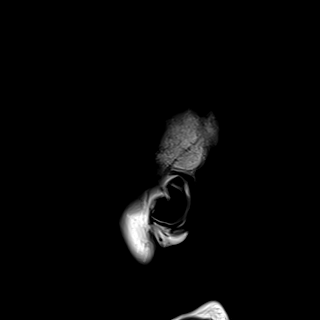
[im 25/25]
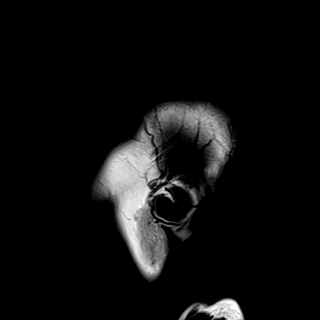

[Series 10: T2 · axial · 5.0mm · 0.72mm/px · z∈[-105,+51]mm · 2 of 27 slices shown (1 of 2)]
[im 1/27]
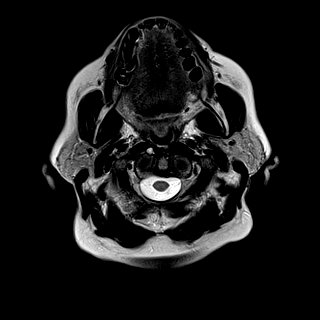
[im 27/27]
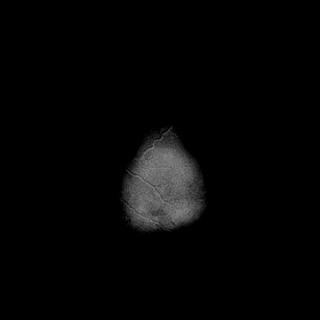

[Series 11: FLAIR · axial · 5.0mm · 0.45mm/px · z∈[-105,+51]mm · 2 of 27 slices shown]
[im 1/27]
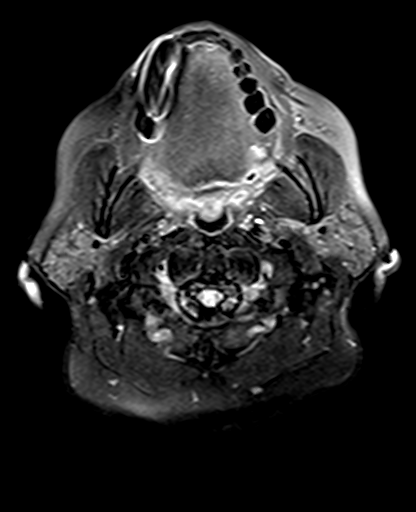
[im 27/27]
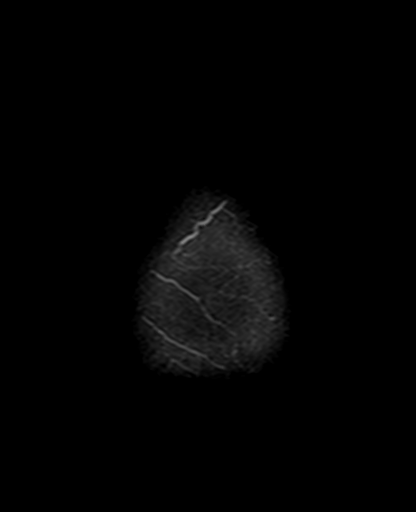

[Series 12: mag_images · axial · 3.0mm · 0.90mm/px · z∈[-122,+55]mm · 5 of 60 slices shown]
[im 1/60]
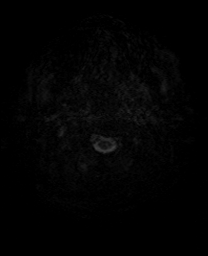
[im 15/60]
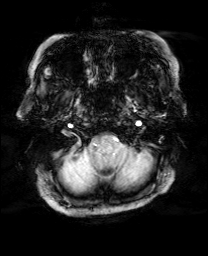
[im 30/60]
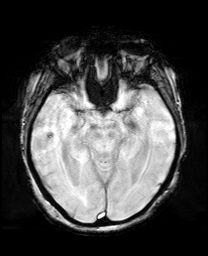
[im 45/60]
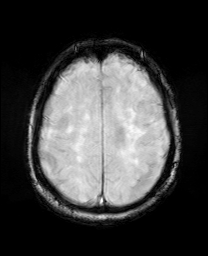
[im 60/60]
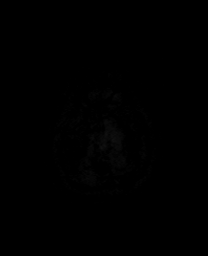

[Series 13: pha_images · axial · 3.0mm · 0.90mm/px · z∈[-119,+55]mm · 5 of 59 slices shown]
[im 1/59]
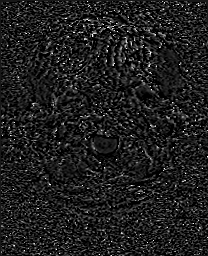
[im 15/59]
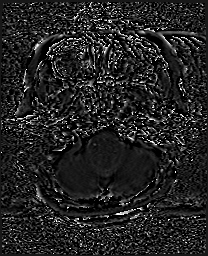
[im 30/59]
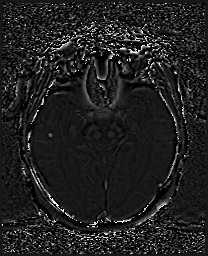
[im 44/59]
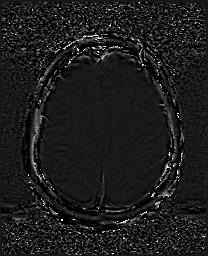
[im 59/59]
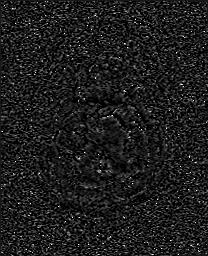

[Series 14: swi_images · axial · 3.0mm · 0.90mm/px · z∈[-122,+55]mm · 5 of 60 slices shown]
[im 1/60]
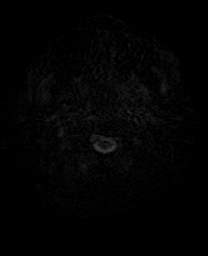
[im 15/60]
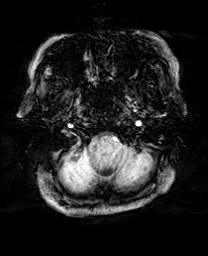
[im 30/60]
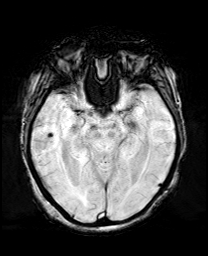
[im 45/60]
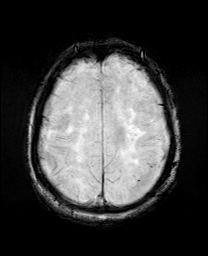
[im 60/60]
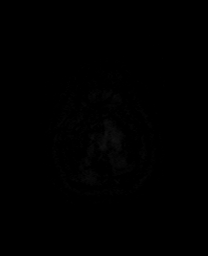

[Series 17: T2 · coronal · 5.0mm · 0.34mm/px · 2 of 29 slices shown (2 of 2)]
[im 1/29]
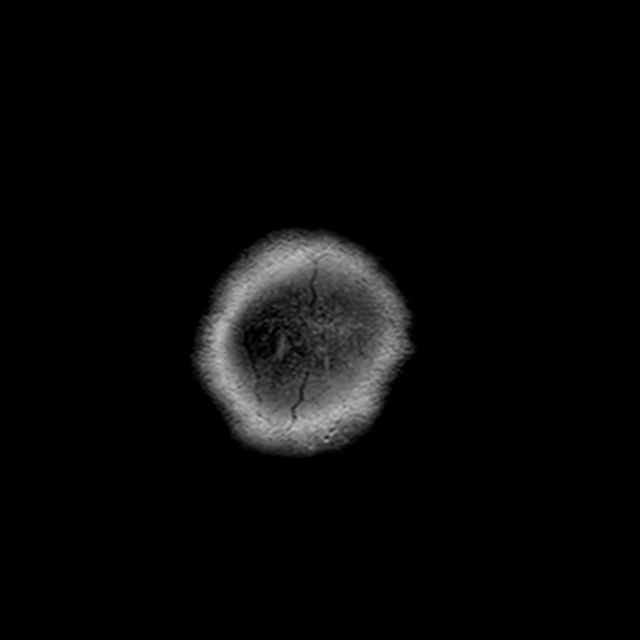
[im 29/29]
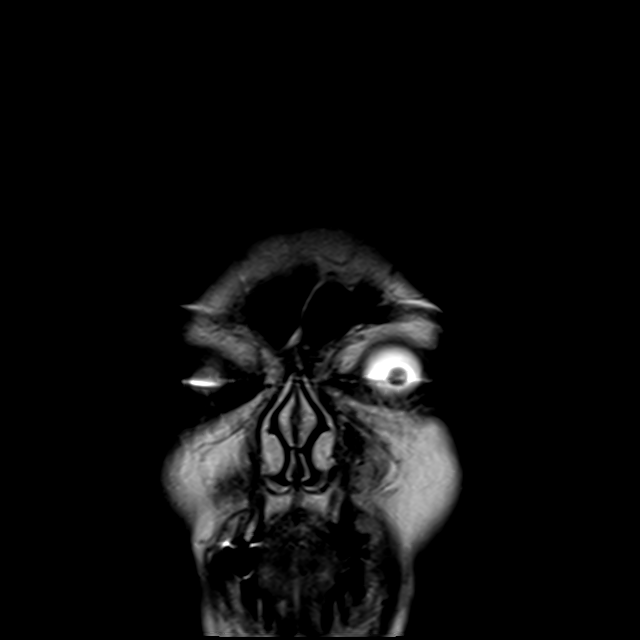

[43 of 48 positions shown; findings below may reference images not displayed]

FINDINGS: Brain: Small focus of subacute ischemia at the base of the right
precentral gyrus. Ischemic focus of the left thalamus is of similar
age. There is multifocal chronic microhemorrhage in a nonspecific
distribution. No acute hemorrhage. There is multifocal hyperintense
T2-weighted signal within the white matter. Generalized cerebral
volume loss. The midline structures are normal.

Vascular: Major flow voids are preserved.

Skull and upper cervical spine: Normal calvarium and skull base.
Visualized upper cervical spine and soft tissues are normal.

Sinuses/Orbits:No paranasal sinus fluid levels or advanced mucosal
thickening. No mastoid or middle ear effusion. Normal orbits.
IMPRESSION: 1. Small foci of subacute ischemia at the base of the right
precentral gyrus and within the left thalamus. No hemorrhage or mass
effect.
2. Chronic ischemic microangiopathy and cerebral volume loss.

## 2021-09-13 MED ORDER — SODIUM CHLORIDE 0.9 % IV SOLN: INTRAVENOUS | Status: DC

## 2021-09-13 MED ORDER — SODIUM CHLORIDE 0.9 % IV BOLUS
1000.0000 mL | Freq: Once | INTRAVENOUS | Status: AC
  Administered 2021-09-13: 1000 mL via INTRAVENOUS

## 2021-09-13 MED ORDER — IOHEXOL 350 MG/ML SOLN
100.0000 mL | Freq: Once | INTRAVENOUS | Status: AC | PRN
  Administered 2021-09-13: 100 mL via INTRAVENOUS

## 2021-09-13 MED ORDER — SODIUM CHLORIDE 0.9% FLUSH: 3.0000 mL | Freq: Once | INTRAVENOUS | Status: DC

## 2021-09-13 NOTE — Assessment & Plan Note (Signed)
Observation telemetry bed. Recommendations per neurology. Will get another MRI brain. Neuro wants TEE. Will consult cards. Keep NPO. Neurology wants loop recorder vs Ziopatch. Defer to cards on what would be most beneficial to diagnosis occult afib. Neuro wants permissive HTN. ?

## 2021-09-13 NOTE — Assessment & Plan Note (Addendum)
BMI 34 

## 2021-09-13 NOTE — Assessment & Plan Note (Signed)
Add SSI. Hold metformin. 

## 2021-09-13 NOTE — Assessment & Plan Note (Signed)
Continue statin zocor 80 mg qhs. ?

## 2021-09-13 NOTE — Assessment & Plan Note (Signed)
Chronic. 

## 2021-09-13 NOTE — Assessment & Plan Note (Signed)
Stable

## 2021-09-13 NOTE — ED Notes (Signed)
Patient transported to MRI 

## 2021-09-13 NOTE — Subjective & Objective (Addendum)
CC: aphasia ?HPI: ?76 year old Afro-American female history of type 2 diabetes, CKD stage IIIb, reflux, hypertension, hyperlipidemia recently discharged from the hospital on 09/02/2021 after suffering an ischemic stroke on 09/01/2021.  At that time she had an 8 x 9 mm acute infarction in the right frontal parietal vertex.  She was discharged to home on aspirin Plavix.  Echo showed no thrombus although poor echo windows were noted. ? ?Patient states around 4:00 today, she was sitting there watching TV.  She suddenly had inability to get the words out of her mouth.  She had some word finding.  She denies any motor or sensory deficits.  EMS was called.  Patient brought to the ER. ? ?Patient brought to the ER today due to aphasia.  Code stroke was called.  Neurology has seen the patient.  TNKase was not given.  Neurology did not document the reason why. ? ?Patient symptoms resolved after 15 minutes upon arrival to the ER.  CT head was negative for acute abnormalities.  CTA showed no LVO. ? ?Neurology is recommend patient be admitted for transesophageal echo and to consider loop recorder versus Zio patch placement. ? ?Upon entering patient's ER room, the patient needed to go to the bathroom.  She was able to get off the gurney herself.  She is able to ambulate without assistance. ? ?[facility] hospitalist contacted for admission. ?

## 2021-09-13 NOTE — ED Provider Notes (Signed)
?[facility] ?Provider Note ? ? ?CSN: [id number] ?Arrival date & time: 09/13/21  1806 ? ?An emergency department physician performed an initial assessment on this suspected stroke patient at 1810. ? ?History ? ?Chief Complaint  ?Patient presents with  ? Code Stroke  ? ? ?Heather Gross is a 76 y.o. female. ? ?HPI ? ?76 year old female with medical history significant for HTN, HLD, glaucoma, recent diagnosis of acute ischemic stroke on aspirin and Plavix who presents to the emergency department as a code stroke.  The patient was last known normal at 1640.  A family member had noted her speech to be garbled and confused.  She was not able to answer orientation questions and therefore EMS was called.  No facial droop, no numbness or weakness.  She was recently hospitalized and discharged on 5/5 after an acute stroke with an MRI brain revealing acute infarction at the right frontoparietal vertex.  She has been taking her aspirin and Plavix.  Symptoms have since resolved.  She denies any other injuries or complaints.  She has been feeling somewhat fatigued.  She has had decreased oral intake over the last few days. ? ?Home Medications ?Prior to Admission medications   ?Medication Sig Start Date End Date Taking? Authorizing Provider  ?aspirin EC 81 MG EC tablet Take 1 tablet (81 mg total) by mouth daily for 21 days. Swallow whole. 09/03/21 09/24/21 Yes Danford, Suann Larry, MD  ?Calcium 500 MG CHEW Chew 2 tablets (1,000 mg total) by mouth daily. 12/13/16  Yes Orlena Sheldon, PA-C  ?clopidogrel (PLAVIX) 75 MG tablet Take 1 tablet (75 mg total) by mouth daily. 09/03/21  Yes Danford, Suann Larry, MD  ?[employer] D3 2000 units CAPS TAKE 1 CAPSULE BY MOUTH EVERY DAY ?Patient taking differently: Take 2,000 Units by mouth daily after breakfast. 12/17/17  Yes Orlena Sheldon, PA-C  ?doxazosin (CARDURA) 1 MG tablet Take 2 mg by mouth in the morning.   Yes [provider]  ?fluticasone (FLONASE) 50  MCG/ACT nasal spray Place 1-2 sprays into both nostrils daily as needed for allergies or rhinitis. 06/06/19  Yes [provider]  ?hydrochlorothiazide (HYDRODIURIL) 25 MG tablet Take 25 mg by mouth daily.   Yes [provider]  ?lisinopril (ZESTRIL) 40 MG tablet Take 40 mg by mouth daily.   Yes [provider]  ?Multiple Vitamin (MULTIVITAMIN) tablet Take 1 tablet by mouth daily with breakfast.   Yes [provider]  ?omeprazole (PRILOSEC) 20 MG capsule Take 20 mg by mouth daily.   Yes [provider]  ?rosuvastatin (CRESTOR) 40 MG tablet Take 40 mg by mouth daily. 09/08/21  Yes [provider]  ?amLODipine (NORVASC) 5 MG tablet Take 1 tablet (5 mg total) by mouth daily. ?Patient not taking: Reported on 09/01/2021 01/08/19   Susy Frizzle, MD  ?metFORMIN (GLUCOPHAGE) 500 MG tablet TAKE 1 TABLET BY MOUTH AT BEDTIME. ?Patient taking differently: Take 500 mg by mouth daily with breakfast. 08/16/20   Alycia Rossetti, MD  ?simvastatin (ZOCOR) 80 MG tablet TAKE 1 TABLET BY MOUTH EVERYDAY AT BEDTIME ?Patient not taking: Reported on 09/13/2021 09/09/20   Susy Frizzle, MD  ?   ? ?Allergies    ?Lipitor [atorvastatin], Shellfish-derived products, and Latex   ? ?Review of Systems   ?Review of Systems  ?All other systems reviewed and are negative. ? ?Physical Exam ?Updated Vital Signs ?BP (!) 163/151   Pulse 82   Temp 98.2 ?F (36.8 ?C) (Oral)  Resp 18   Wt 94 kg   SpO2 96%   BMI 34.49 kg/m?  ?Physical Exam ?Vitals and nursing note reviewed.  ?Constitutional:   ?   General: She is not in acute distress. ?   Appearance: She is well-developed.  ?HENT:  ?   Head: Normocephalic and atraumatic.  ?Eyes:  ?   Conjunctiva/sclera: Conjunctivae normal.  ?Cardiovascular:  ?   Rate and Rhythm: Normal rate and regular rhythm.  ?Pulmonary:  ?   Effort: Pulmonary effort is normal. No respiratory distress.  ?   Breath sounds: Normal breath sounds.  ?Abdominal:  ?   Palpations: Abdomen  is soft.  ?   Tenderness: There is no abdominal tenderness.  ?Musculoskeletal:     ?   General: No swelling.  ?   Cervical back: Neck supple.  ?Skin: ?   General: Skin is warm and dry.  ?   Capillary Refill: Capillary refill takes less than 2 seconds.  ?Neurological:  ?   General: No focal deficit present.  ?   Mental Status: She is alert and oriented to person, place, and time.  ?   Cranial Nerves: No cranial nerve deficit.  ?   Sensory: No sensory deficit.  ?   Motor: No weakness.  ?Psychiatric:     ?   Mood and Affect: Mood normal.  ? ? ?ED Results / Procedures / Treatments   ?Labs ?(all labs ordered are listed, but only abnormal results are displayed) ?Labs Reviewed  ?CBC - Abnormal; Notable for the following components:  ?    Result Value  ? RBC 3.79 (*)   ? Hemoglobin 11.9 (*)   ? All other components within normal limits  ?COMPREHENSIVE METABOLIC PANEL - Abnormal; Notable for the following components:  ? Glucose, Bld 125 (*)   ? Creatinine, Ser 1.63 (*)   ? Total Bilirubin 0.2 (*)   ? GFR, Estimated 33 (*)   ? All other components within normal limits  ?URINALYSIS, ROUTINE W REFLEX MICROSCOPIC - Abnormal; Notable for the following components:  ? APPearance HAZY (*)   ? Specific Gravity, Urine 1.039 (*)   ? Hgb urine dipstick MODERATE (*)   ? Bacteria, UA FEW (*)   ? All other components within normal limits  ?I-STAT CHEM 8, ED - Abnormal; Notable for the following components:  ? BUN 25 (*)   ? Creatinine, Ser 1.50 (*)   ? Glucose, Bld 125 (*)   ? Calcium, Ion 1.13 (*)   ? All other components within normal limits  ?CBG MONITORING, ED - Abnormal; Notable for the following components:  ? Glucose-Capillary 122 (*)   ? All other components within normal limits  ?PROTIME-INR  ?APTT  ?DIFFERENTIAL  ?AMMONIA  ?ETHANOL  ?TROPONIN I (HIGH SENSITIVITY)  ?TROPONIN I (HIGH SENSITIVITY)  ? ? ?EKG ?EKG Interpretation ? ?Date/Time:  Tuesday Sep 13 2021 18:35:16 EDT ?Ventricular Rate:  86 ?PR Interval:  164 ?QRS  Duration: 135 ?QT Interval:  404 ?QTC Calculation: 484 ?R Axis:   47 ?Text Interpretation: Sinus rhythm Left bundle branch block No significant change since last tracing Confirmed by Regan Lemming ([id number]) on 09/13/2021 8:15:04 PM ? ?Radiology ?MR BRAIN WO CONTRAST ? ?Result Date: 09/13/2021 ?CLINICAL DATA:  Transient ischemic attack EXAM: MRI HEAD WITHOUT CONTRAST TECHNIQUE: Multiplanar, multiecho pulse sequences of the brain and surrounding structures were obtained without intravenous contrast. COMPARISON:  09/01/2021 FINDINGS: Brain: Small focus of subacute ischemia at the base of the right precentral gyrus. Ischemic  focus of the left thalamus is of similar age. There is multifocal chronic microhemorrhage in a nonspecific distribution. No acute hemorrhage. There is multifocal hyperintense T2-weighted signal within the white matter. Generalized cerebral volume loss. The midline structures are normal. Vascular: Major flow voids are preserved. Skull and upper cervical spine: Normal calvarium and skull base. Visualized upper cervical spine and soft tissues are normal. Sinuses/Orbits:No paranasal sinus fluid levels or advanced mucosal thickening. No mastoid or middle ear effusion. Normal orbits. IMPRESSION: 1. Small foci of subacute ischemia at the base of the right precentral gyrus and within the left thalamus. No hemorrhage or mass effect. 2. 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 Electronically Signed   By: Ulyses Jarred M.D.   On: 09/13/2021 23:11  ? ?CT HEAD CODE STROKE WO CONTRAST ? ?Result Date: 09/13/2021 ?CLINICAL DATA:  Code stroke.  Neuro deficit, acute, stroke suspected EXAM: CT HEAD WITHOUT CONTRAST TECHNIQUE: Contiguous axial images were obtained from the base of the skull through the vertex without intravenous contrast. RADIATION DOSE REDUCTION: This exam was performed according to the departmental dose-optimization program which includes automated exposure control, adjustment of the mA  and/or kV according to patient size and/or use of iterative reconstruction technique. COMPARISON:  09/01/2021 FINDINGS: Brain: No acute intracranial hemorrhage, mass effect, or edema. No new loss of gray-white differen

## 2021-09-13 NOTE — Assessment & Plan Note (Signed)
Stable. Baseline Scr 1.3-1.6 ?

## 2021-09-13 NOTE — Assessment & Plan Note (Signed)
Holding HTN meds. Neurology wants permissive HTN. ?

## 2021-09-13 NOTE — H&P (Signed)
?History and Physical  ? ? ?Heather Gross IHK:[id number] DOB: 23-Jul-1945 DOA: 09/13/2021 ? ?DOS: the patient was seen and examined on 09/13/2021 ? ?PCP: [name], NP  ? ?Patient coming from: Home ? ?I have personally briefly reviewed patient's old medical records in [place] ? ?CC: aphasia ?HPI: ?76 year old Afro-American female history of type 2 diabetes, CKD stage IIIb, reflux, hypertension, hyperlipidemia recently discharged from the hospital on 09/02/2021 after suffering an ischemic stroke on 09/01/2021.  At that time she had an 8 x 9 mm acute infarction in the right frontal parietal vertex.  She was discharged to home on aspirin Plavix.  Echo showed no thrombus although poor echo windows were noted. ? ?Patient states around 4:00 today, she was sitting there watching TV.  She suddenly had inability to get the words out of her mouth.  She had some word finding.  She denies any motor or sensory deficits.  EMS was called.  Patient brought to the ER. ? ?Patient brought to the ER today due to aphasia.  Code stroke was called.  Neurology has seen the patient.  TNKase was not given.  Neurology did not document the reason why. ? ?Patient symptoms resolved after 15 minutes upon arrival to the ER.  CT head was negative for acute abnormalities.  CTA showed no LVO. ? ?Neurology is recommend patient be admitted for transesophageal echo and to consider loop recorder versus Zio patch placement. ? ?Upon entering patient's ER room, the patient needed to go to the bathroom.  She was able to get off the gurney herself.  She is able to ambulate without assistance. ? ?[facility] hospitalist contacted for admission.  ? ?ED Course: symptoms resolved within 15 mins of arrival to ER. No TNKase given. Defer to neurology on the reason. CT head and CT angio head/neck negative. ? ?Review of Systems:  ?Review of Systems  ?Constitutional: Negative.   ?HENT: Negative.    ?Eyes: Negative.   ?Respiratory: Negative.  Negative for cough and  hemoptysis.   ?Cardiovascular: Negative.  Negative for chest pain, palpitations and orthopnea.  ?Gastrointestinal: Negative.   ?Genitourinary: Negative.   ?Musculoskeletal: Negative.   ?Skin: Negative.   ?Neurological:   ?     Expressive aphasia  ?Endo/Heme/Allergies: Negative.   ?Psychiatric/Behavioral: Negative.    ?All other systems reviewed and are negative. ? ?Past Medical History:  ?Diagnosis Date  ? Glaucoma   ? Hyperlipidemia   ? Hypertension   ? ? ?Past Surgical History:  ?Procedure Laterality Date  ? BREAST BIOPSY Right 09/29/2015  ? BREAST EXCISIONAL BIOPSY Right 11/04/2015  ? papilloma  ? BREAST LUMPECTOMY WITH RADIOACTIVE SEED LOCALIZATION Right 11/04/2015  ? Procedure: BREAST LUMPECTOMY WITH RADIOACTIVE SEED LOCALIZATION;  Surgeon: Autumn Messing III, MD;  Location: [facility];  Service: General;  Laterality: Right;  ? LIPOMA EXCISION    ? ? ? reports that she quit smoking about 31 years ago. She has never used smokeless tobacco. She reports that she does not drink alcohol and does not use drugs. ? ?Allergies  ?Allergen Reactions  ? Lipitor [Atorvastatin] Other (See Comments)  ?  Made mouth very dry and the lips discolored  ? Shellfish-Derived Products Other (See Comments)  ?  Hands break out  ? Latex Rash  ? ? ?Family History  ?Problem Relation Age of Onset  ? Diabetes Sister   ? Breast cancer Sister   ? Diabetes Brother   ? Diabetes Sister   ? Diabetes Sister   ? Diabetes  Sister   ? Diabetes Sister   ? ? ?Prior to Admission medications   ?Medication Sig Start Date End Date Taking? Authorizing Provider  ?aspirin EC 81 MG EC tablet Take 1 tablet (81 mg total) by mouth daily for 21 days. Swallow whole. 09/03/21 09/24/21 Yes Danford, Suann Larry, MD  ?amLODipine (NORVASC) 5 MG tablet Take 1 tablet (5 mg total) by mouth daily. ?Patient not taking: Reported on 09/01/2021 01/08/19   Susy Frizzle, MD  ?Calcium 500 MG CHEW Chew 2 tablets (1,000 mg total) by mouth daily. 12/13/16   Orlena Sheldon, PA-C   ?clopidogrel (PLAVIX) 75 MG tablet Take 1 tablet (75 mg total) by mouth daily. 09/03/21   Danford, Suann Larry, MD  ?[employer] D3 2000 units CAPS TAKE 1 CAPSULE BY MOUTH EVERY DAY ?Patient taking differently: Take 2,000 Units by mouth daily after breakfast. 12/17/17   Orlena Sheldon, PA-C  ?doxazosin (CARDURA) 1 MG tablet Take 1 mg by mouth in the morning.    [provider]  ?fluticasone (FLONASE) 50 MCG/ACT nasal spray Place 1-2 sprays into both nostrils daily as needed for allergies or rhinitis. 06/06/19   [provider]  ?lisinopril (ZESTRIL) 40 MG tablet Take 40 mg by mouth daily.    [provider]  ?metFORMIN (GLUCOPHAGE) 500 MG tablet TAKE 1 TABLET BY MOUTH AT BEDTIME. ?Patient taking differently: Take 500 mg by mouth daily with breakfast. 08/16/20   Alycia Rossetti, MD  ?Multiple Vitamin (MULTIVITAMIN) tablet Take 1 tablet by mouth daily with breakfast.    [provider]  ?rosuvastatin (CRESTOR) 40 MG tablet Take 40 mg by mouth daily. 09/08/21   [provider]  ?simvastatin (ZOCOR) 80 MG tablet TAKE 1 TABLET BY MOUTH EVERYDAY AT BEDTIME ?Patient taking differently: Take 80 mg by mouth at bedtime. 09/09/20   Susy Frizzle, MD  ? ? ?Physical Exam: ?Vitals:  ? 09/13/21 1836 09/13/21 2000 09/13/21 2030 09/13/21 2100  ?BP: (!) 172/67 133/64 (!) 147/66   ?Pulse: 85 76 77   ?Resp: (!) '22 14 15 16  '$ ?Temp:      ?TempSrc:      ?SpO2: 99% 97% 97% 100%  ?Weight:      ? ? ?Physical Exam ?Vitals and nursing note reviewed.  ?Constitutional:   ?   General: She is not in acute distress. ?   Appearance: Normal appearance. She is not ill-appearing, toxic-appearing or diaphoretic.  ?HENT:  ?   Head: Normocephalic and atraumatic.  ?   Nose: Nose normal. No rhinorrhea.  ?Eyes:  ?   General: No scleral icterus. ?Cardiovascular:  ?   Rate and Rhythm: Normal rate and regular rhythm.  ?   Pulses: Normal pulses.  ?Pulmonary:  ?   Effort: Pulmonary effort is normal. No respiratory distress.  ?    Breath sounds: Normal breath sounds. No wheezing or rales.  ?Abdominal:  ?   General: Bowel sounds are normal. There is no distension.  ?   Palpations: Abdomen is soft.  ?   Tenderness: There is no abdominal tenderness. There is no guarding or rebound.  ?Skin: ?   General: Skin is warm and dry.  ?   Capillary Refill: Capillary refill takes less than 2 seconds.  ?Neurological:  ?   General: No focal deficit present.  ?   Mental Status: She is alert and oriented to person, place, and time.  ?   Gait: Gait is intact. Gait normal.  ?  ? ?Labs on  Admission: I have personally reviewed following labs and imaging studies ? ?CBC: ?Recent Labs  ?Lab 09/13/21 ?1809 09/13/21 ?1813  ?WBC 6.7  --   ?NEUTROABS 4.4  --   ?HGB 11.9* 12.6  ?HCT 36.5 37.0  ?MCV 96.3  --   ?PLT 157  --   ? ?Basic Metabolic Panel: ?Recent Labs  ?Lab 09/13/21 ?1809 09/13/21 ?1813  ?NA 140 140  ?K 4.3 4.2  ?CL 107 105  ?CO2 24  --   ?GLUCOSE 125* 125*  ?BUN 21 25*  ?CREATININE 1.63* 1.50*  ?CALCIUM 9.5  --   ? ?GFR: ?Estimated Creatinine Clearance: 36.7 mL/min (A) (by C-G formula based on SCr of 1.5 mg/dL (H)). ?Liver Function Tests: ?Recent Labs  ?Lab 09/13/21 ?1809  ?AST 22  ?ALT 13  ?ALKPHOS 50  ?BILITOT 0.2*  ?PROT 7.1  ?ALBUMIN 4.0  ? ?No results for input(s): LIPASE, AMYLASE in the last 168 hours. ?Recent Labs  ?Lab 09/13/21 ?1906  ?AMMONIA 17  ? ?Coagulation Profile: ?Recent Labs  ?Lab 09/13/21 ?1809  ?INR 0.9  ? ?Cardiac Enzymes: ?Recent Labs  ?Lab 09/13/21 ?1930  ?TROPONINIHS 8  ? ?BNP (last 3 results) ?No results for input(s): PROBNP in the last 8760 hours. ?HbA1C: ?No results for input(s): HGBA1C in the last 72 hours. ?CBG: ?Recent Labs  ?Lab 09/13/21 ?1808  ?GLUCAP 122*  ? ?Lipid Profile: ?No results for input(s): CHOL, HDL, LDLCALC, TRIG, CHOLHDL, LDLDIRECT in the last 72 hours. ?Thyroid Function Tests: ?No results for input(s): TSH, T4TOTAL, FREET4, T3FREE, THYROIDAB in the last 72 hours. ?Anemia Panel: ?No results for input(s):  VITAMINB12, FOLATE, FERRITIN, TIBC, IRON, RETICCTPCT in the last 72 hours. ?Urine analysis: ?   ?Component Value Date/Time  ? [place] 09/13/2021 1905  ? APPEARANCEUR HAZY (A) 09/13/2021 1905  ? LABSPEC 1.039

## 2021-09-13 NOTE — ED Triage Notes (Signed)
Pt arrives from home via [facility]. Pt brought in as CODE STROKE. LKW 1640. Pts grand daughter called pt at 1640 and she reported the patient "did not sound right." Upon arrival pt is alert and oriented x 1.   ?

## 2021-09-13 NOTE — Consult Note (Signed)
?                    NEURO HOSPITALIST CONSULT NOTE  ? ?Requestig physician: Dr. Armandina Gemma ? ?Reason for Consult:Acute onset of aphasia ? ?History obtained from:  EMS and Chart    ? ?HPI:                                                                                                                                         ? Heather Gross is an 76 y.o. female with a recent stroke managed at [facility] about 3 weeks ago, HTN, HLD and glaucoma presenting as a Code Stroke via EMS for acute onset of aphasia. LKN was 1640 when family last communicated to her. About 45 minutes prior to her arrival to the ED, a family member noted her speech to be garbled and EMS was called. On arrival by EMS, she was noted to have trouble with word-finding, could not answer orientation questions correctly and appeared confused, but was not weak, had no facial droop and was not unsteady on her feet. CBG was 122, HR 80 and BP 156/78. ? ?On arrival to the ED, she continued to have expressive dysphasia.  ? ?Recent MRI brain (5/4): 8 x 9 mm acute infarction at the right frontoparietal vertex primarily affecting the subcortical white matter. Extensive chronic small-vessel ischemic changes elsewhere throughout ?the brain.  ? ?She is currently on ASA and Plavix, as well as simvastatin. She is not on a blood thinner.  ? ?Past Medical History:  ?Diagnosis Date  ? Glaucoma   ? Hyperlipidemia   ? Hypertension   ? ? ?Past Surgical History:  ?Procedure Laterality Date  ? BREAST BIOPSY Right 09/29/2015  ? BREAST EXCISIONAL BIOPSY Right 11/04/2015  ? papilloma  ? BREAST LUMPECTOMY WITH RADIOACTIVE SEED LOCALIZATION Right 11/04/2015  ? Procedure: BREAST LUMPECTOMY WITH RADIOACTIVE SEED LOCALIZATION;  Surgeon: Autumn Messing III, MD;  Location: [facility];  Service: General;  Laterality: Right;  ? LIPOMA EXCISION    ? ? ?Family History  ?Problem Relation Age of Onset  ? Diabetes Sister   ? Breast cancer Sister   ? Diabetes Brother   ? Diabetes Sister   ?  Diabetes Sister   ? Diabetes Sister   ? Diabetes Sister   ?          ? ?Social History:  reports that she quit smoking about 31 years ago. She has never used smokeless tobacco. She reports that she does not drink alcohol and does not use drugs. ? ?Allergies  ?Allergen Reactions  ? Lipitor [Atorvastatin] Other (See Comments)  ?  Made mouth very dry and the lips discolored  ? Shellfish-Derived Products Other (See Comments)  ?  Hands break out  ? Latex Rash  ? ? ?HOME MEDICATIONS:                                                                                                                     ? ?  No current facility-administered medications on file prior to encounter.  ? ?Current Outpatient Medications on File Prior to Encounter  ?Medication Sig Dispense Refill  ? amLODipine (NORVASC) 5 MG tablet Take 1 tablet (5 mg total) by mouth daily. (Patient not taking: Reported on 09/01/2021) 90 tablet 3  ? aspirin EC 81 MG EC tablet Take 1 tablet (81 mg total) by mouth daily for 21 days. Swallow whole. 21 tablet 0  ? Calcium 500 MG CHEW Chew 2 tablets (1,000 mg total) by mouth daily. 30 tablet 0  ? clopidogrel (PLAVIX) 75 MG tablet Take 1 tablet (75 mg total) by mouth daily. 30 tablet 3  ? CVS D3 2000 units CAPS TAKE 1 CAPSULE BY MOUTH EVERY DAY (Patient taking differently: Take 2,000 Units by mouth daily after breakfast.) 100 capsule 2  ? doxazosin (CARDURA) 1 MG tablet Take 1 mg by mouth in the morning.    ? fluticasone (FLONASE) 50 MCG/ACT nasal spray Place 1-2 sprays into both nostrils daily as needed for allergies or rhinitis.    ? lisinopril (ZESTRIL) 40 MG tablet Take 40 mg by mouth daily.    ? metFORMIN (GLUCOPHAGE) 500 MG tablet TAKE 1 TABLET BY MOUTH AT BEDTIME. (Patient not taking: Reported on 09/01/2021) 30 tablet 0  ? Multiple Vitamin (MULTIVITAMIN) tablet Take 1 tablet by mouth daily with breakfast.    ? simvastatin (ZOCOR) 80 MG tablet TAKE 1 TABLET BY MOUTH EVERYDAY AT BEDTIME (Patient taking differently: Take 80  mg by mouth at bedtime.) 90 tablet 0  ? ? ? ?ROS:                                                                                                                                       ?Unable to provide a detailed ROS due to confusion.  ? ? ?Wt 94 kg   BMI 34.49 kg/m?  ? ?General Examination:                                                                                                      ? ?Physical Exam  ?HEENT-  [place]/AT    ?Lungs- Respirations unlabored ?Extremities- Warm and well perfused ? ? ?Neurological Examination ?Mental Status: Awake and alert. Appears confused. Speech is without dysarthria, but patient with hesitant speech in addition to occasional phonemic paraphasias. Initially not able to answer any orientation questions correctly other than her name, and identifying the hospital that she is in. 15 minutes after arrival, on second exam, she was  oriented to the city, state, her name, her age and "Wednesday", giving the wrong year and month. She was able to state that she is here for difficulty speaking. She was able to name all 5 fingers correctly except for the index finger, but did have a delay in answering when identifying the middle finger. She was able to state that the president is Biden, but could not name any other presidents before him.  ?Cranial Nerves: ?II: Temporal visual fields intact with no extinction to DSS. PERRL.  ?III,IV, VI: No ptosis. EOMI but with saccadic quality to pursuits. No nystagmus.  ?V: Temp sensation equal bilaterally ?VII: Smile symmetric ?VIII: Hearing intact to voice ?IX,X: No hypophonia or hoarseness ?XI: Delay on the RIGHT with shoulder shrug ?XII: No lingual dysarthria ?Motor: ?Right : Upper extremity   5/5    Left:     Upper extremity   5/5 ? Lower extremity   5/5     Lower extremity   5/5 ?No pronator drift.  ?No jerking, twitching or tremor noted.  ?Sensory: Temp and light touch intact x 4 without asymmetry. No extinction to DSS.  ?Deep Tendon Reflexes: 2+ and  symmetric throughout ?Plantars: Right: downgoing   Left: downgoing ?Cerebellar: No ataxia with FNF bilaterally.  ?Gait: Deferred ?  ?Lab Results: ?Basic Metabolic Panel: ?No results for input(s): NA, K, CL, CO2, GLUCOSE, BUN, CREATININE, CALCIUM, MG, PHOS in the last 168 hours. ? ?CBC: ?No results for input(s): WBC, NEUTROABS, HGB, HCT, MCV, PLT in the last 168 hours. ? ?Cardiac Enzymes: ?No results for input(s): CKTOTAL, CKMB, CKMBINDEX, TROPONINI in the last 168 hours. ? ?Lipid Panel: ?No results for input(s): CHOL, TRIG, HDL, CHOLHDL, VLDL, LDLCALC in the last 168 hours. ? ?Imaging: ?No results found. ? ? ?Assessment: 76 y.o. female with a recent stroke managed at [facility] about 3 weeks ago, HTN, HLD and glaucoma presenting as a Code Stroke via EMS for acute onset of aphasia. LKN was 1640 when family last communicated to her. About 45 minutes prior to her arrival to the ED, a family member noted her speech to be garbled and EMS was called. On arrival by EMS, she was noted to have trouble with word-finding, could not answer orientation questions correctly and appeared confused, but was not weak, had no facial droop and was not unsteady on her feet. CBG was 122, HR 80 and BP 156/78. On arrival to the ED, she continued to have expressive dysphasia. She is currently on ASA and Plavix at home, as well as simvastatin. She is not on a blood thinner.  ?1. Initially with occasional paraphasias and hesitant speech, but repeat exam 15 minutes after arrival is more consistent with confusion. NIHSS on repeat exam is 0.  ?2. CT head negative for acute abnormality ?3. CTA of head and neck: No intracranial large vessel occlusion. Multifocal narrowing is again noted. No hemodynamically significant stenosis in the neck. ?4. No infarct core or penumbra on CT perfusion. ?5. Recent stroke work up: ?- MRI brain (5/4): 8 x 9 mm acute infarction at the right frontoparietal vertex primarily affecting the subcortical white matter. Extensive  chronic small-vessel ischemic changes elsewhere throughout ?the brain.  ?- CTA of neck (5/4): The common carotid, internal carotid and vertebral arteries are ?patent within the neck without hemodynamically

## 2021-09-13 NOTE — Code Documentation (Signed)
Stroke Response Nurse Documentation ?Code Documentation ? ?Heather Gross is a 77 y.o. female arriving to [facility]  via [facility] on 09/13/21 with past medical hx of recent admission for CVA. On aspirin 81 mg daily and clopidogrel 75 mg daily. Code stroke was activated by ED.  ? ?Patient from home where she was LKW at [place] when she dropped her grand daughter off. Her grand daughter called her later and family called EMS because she seemed altered and was having difficulties finding her words.   ? ?Stroke team at the bedside on patient arrival. Labs drawn and patient cleared for CT by EDP. Patient to CT with team. NIHSS 0, see documentation for details and code stroke times. The following imaging was completed:  CT Head, CTA, and CTP. Patient is not a candidate for IV Thrombolytic due to improving assessment. Patient is not a candidate for IR due to no suspected LVO.  ? ?Care Plan: Continue Q2 vitals and NIHSS.  ? ?Bedside handoff with ED RN Legrand Como. ? ?Heather Gross  ?Stroke Response RN ? ? ?

## 2021-09-13 NOTE — Assessment & Plan Note (Signed)
Unclear if current symptoms are related to prior CVA or new TIA/CVA. ?

## 2021-09-14 ENCOUNTER — Other Ambulatory Visit (HOSPITAL_COMMUNITY): Payer: Self-pay

## 2021-09-14 ENCOUNTER — Encounter (HOSPITAL_COMMUNITY): Payer: Self-pay | Admitting: Internal Medicine

## 2021-09-14 DIAGNOSIS — I10 Essential (primary) hypertension: Secondary | ICD-10-CM | POA: Diagnosis not present

## 2021-09-14 DIAGNOSIS — K219 Gastro-esophageal reflux disease without esophagitis: Secondary | ICD-10-CM | POA: Diagnosis not present

## 2021-09-14 DIAGNOSIS — I679 Cerebrovascular disease, unspecified: Secondary | ICD-10-CM | POA: Diagnosis not present

## 2021-09-14 DIAGNOSIS — I639 Cerebral infarction, unspecified: Secondary | ICD-10-CM | POA: Diagnosis not present

## 2021-09-14 DIAGNOSIS — E785 Hyperlipidemia, unspecified: Secondary | ICD-10-CM | POA: Diagnosis not present

## 2021-09-14 MED ORDER — ASPIRIN EC 81 MG PO TBEC
81.0000 mg | DELAYED_RELEASE_TABLET | Freq: Every day | ORAL | Status: DC
  Administered 2021-09-14: 81 mg via ORAL
  Filled 2021-09-14: qty 1

## 2021-09-14 MED ORDER — ACETAMINOPHEN 160 MG/5ML PO SOLN: 650.0000 mg | ORAL | Status: DC | PRN

## 2021-09-14 MED ORDER — HEPARIN SODIUM (PORCINE) 5000 UNIT/ML IJ SOLN
5000.0000 [IU] | Freq: Three times a day (TID) | INTRAMUSCULAR | Status: DC
  Administered 2021-09-14: 5000 [IU] via SUBCUTANEOUS
  Filled 2021-09-14: qty 1

## 2021-09-14 MED ORDER — ACETAMINOPHEN 325 MG PO TABS: 650.0000 mg | ORAL_TABLET | ORAL | Status: DC | PRN

## 2021-09-14 MED ORDER — TICAGRELOR 90 MG PO TABS
90.0000 mg | ORAL_TABLET | Freq: Two times a day (BID) | ORAL | 0 refills | Status: DC
  Filled 2021-09-14: qty 60, 30d supply, fill #0

## 2021-09-14 MED ORDER — CLOPIDOGREL BISULFATE 75 MG PO TABS: 75.0000 mg | ORAL_TABLET | Freq: Every day | ORAL | Status: DC

## 2021-09-14 MED ORDER — STROKE: EARLY STAGES OF RECOVERY BOOK
Freq: Once | Status: AC
  Filled 2021-09-14: qty 1

## 2021-09-14 MED ORDER — ACETAMINOPHEN 650 MG RE SUPP: 650.0000 mg | RECTAL | Status: DC | PRN

## 2021-09-14 MED ORDER — TICAGRELOR 90 MG PO TABS
90.0000 mg | ORAL_TABLET | Freq: Two times a day (BID) | ORAL | Status: DC
  Administered 2021-09-14: 90 mg via ORAL
  Filled 2021-09-14: qty 1

## 2021-09-14 MED ORDER — PANTOPRAZOLE SODIUM 20 MG PO TBEC
20.0000 mg | DELAYED_RELEASE_TABLET | Freq: Every day | ORAL | 3 refills | Status: AC
  Filled 2021-09-14: qty 30, 30d supply, fill #0

## 2021-09-14 MED ORDER — ROSUVASTATIN CALCIUM 20 MG PO TABS
40.0000 mg | ORAL_TABLET | Freq: Every day | ORAL | Status: DC
  Administered 2021-09-14: 40 mg via ORAL
  Filled 2021-09-14: qty 2

## 2021-09-14 NOTE — Hospital Course (Signed)
76 year old Afro-American female history of type 2 diabetes, CKD stage IIIb, reflux, hypertension, hyperlipidemia recently discharged from the hospital on 09/02/2021 after suffering an ischemic stroke on 09/01/2021.  At that time she had an 8 x 9 mm acute infarction in the right frontal parietal vertex.  She was discharged to home on aspirin Plavix.  Echo showed no thrombus although poor echo windows were noted. ?  ?Patient states around 4:00 today, she was sitting there watching TV.  She suddenly had inability to get the words out of her mouth.  She had some word finding.  She denies any motor or sensory deficits.  EMS was called.  Patient brought to the ER. ?  ?Patient brought to the ER today due to aphasia.  Code stroke was called.  Neurology has seen the patient.  TNKase was not given.  Neurology did not document the reason why. ?  ?Patient symptoms resolved after 15 minutes upon arrival to the ER.  CT head was negative for acute abnormalities.  CTA showed no LVO. ?

## 2021-09-14 NOTE — Evaluation (Signed)
Physical Therapy Evaluation and Discharge ?Patient Details ?Name: Heather Gross ?MRN: [id number] ?DOB: November 23, 1945 ?Today's Date: 09/14/2021 ? ?History of Present Illness ? Pt is a 76 y/o female admitted secondary to word finding difficulty. MRI showed Small foci of subacute ischemia at the base of the right  precentral gyrus and within the left thalamus. Recent admission for CVA. PMH includes HTN, DM, and CKD.  ?Clinical Impression ? Patient evaluated by Physical Therapy with no further acute PT needs identified. All education has been completed and the patient has no further questions. Pt overall at a mod I level with mobility tasks. Able to perform DGI tasks without LOB. Pt reports she feels she is back at her baseline. Educated about BE FAST acronym in recognizing CVA symptoms. See below for any follow-up Physical Therapy or equipment needs. PT is signing off. Thank you for this referral. If needs change, please re-consult.  ?   ?   ? ?Recommendations for follow up therapy are one component of a multi-disciplinary discharge planning process, led by the attending physician.  Recommendations may be updated based on patient status, additional functional criteria and insurance authorization. ? ?Follow Up Recommendations No PT follow up ? ?  ?Assistance Recommended at Discharge Intermittent Supervision/Assistance  ?Patient can return home with the following ?   ? ?  ?Equipment Recommendations None recommended by PT  ?Recommendations for Other Services ?    ?  ?Functional Status Assessment Patient has had a recent decline in their functional status and demonstrates the ability to make significant improvements in function in a reasonable and predictable amount of time.  ? ?  ?Precautions / Restrictions Precautions ?Precautions: None ?Restrictions ?Weight Bearing Restrictions: No  ? ?  ? ?Mobility ? Bed Mobility ?Overal bed mobility: Modified Independent ?  ?  ?  ?  ?  ?  ?General bed mobility comments: Increased time to  sit up on stretcher ?  ? ?Transfers ?Overall transfer level: Modified independent ?  ?  ?  ?  ?  ?  ?  ?  ?  ?  ? ?Ambulation/Gait ?Ambulation/Gait assistance: Modified independent (Device/Increase time) ?Gait Distance (Feet): 120 Feet ?Assistive device: None ?Gait Pattern/deviations: Step-through pattern, Decreased stride length ?Gait velocity: Decreased ?  ?  ?General Gait Details: Slower gait, but overall steady. Able to increase gait speed with cues. Able to perform DGI tasks without LOB. ? ?Stairs ?  ?  ?  ?  ?  ? ?Wheelchair Mobility ?  ? ?Modified Rankin (Stroke Patients Only) ?Modified Rankin (Stroke Patients Only) ?Pre-Morbid Rankin Score: No symptoms ?Modified Rankin: No significant disability ? ?  ? ?Balance Overall balance assessment: Modified Independent ?  ?  ?  ?  ?  ?  ?  ?  ?  ?  ?  ?  ?  ?  ?  ?Standardized Balance Assessment ?Standardized Balance Assessment : Dynamic Gait Index ?  ?Dynamic Gait Index ?Level Surface: Mild Impairment ?Change in Gait Speed: Normal ?Gait with Horizontal Head Turns: Normal ?Gait with Vertical Head Turns: Normal ?Gait and Pivot Turn: Mild Impairment ?Step Over Obstacle: Normal ?Step Around Obstacles: Normal ?   ? ? ? ?Pertinent Vitals/Pain Pain Assessment ?Pain Assessment: No/denies pain  ? ? ?Home Living Family/patient expects to be discharged to:: Private residence ?Living Arrangements: Children;Spouse/significant other ?Available Help at Discharge: Family;Available 24 hours/day ?Type of Home: House ?Home Access: Stairs to enter ?Entrance Stairs-Rails: None ?Entrance Stairs-Number of Steps: 2 ?  ?Home Layout: One level ?Home Equipment:  Rolling Walker (2 wheels) ?   ?  ?Prior Function Prior Level of Function : Independent/Modified Independent;Driving ?  ?  ?  ?  ?  ?  ?  ?  ?  ? ? ?Hand Dominance  ?   ? ?  ?Extremity/Trunk Assessment  ? Upper Extremity Assessment ?Upper Extremity Assessment: Defer to OT evaluation ?  ? ?Lower Extremity Assessment ?Lower Extremity  Assessment: Generalized weakness ?  ? ?Cervical / Trunk Assessment ?Cervical / Trunk Assessment: Normal  ?Communication  ? Communication: No difficulties  ?Cognition Arousal/Alertness: Awake/alert ?Behavior During Therapy: [facility] for tasks assessed/performed ?Overall Cognitive Status: Within Functional Limits for tasks assessed ?  ?  ?  ?  ?  ?  ?  ?  ?  ?  ?  ?  ?  ?  ?  ?  ?General Comments: WFL for basic tasks; not formally assessed ?  ?  ? ?  ?General Comments General comments (skin integrity, edema, etc.): Educated about "BE FAST" acronym in recognizing CVA symptoms. ? ?  ?Exercises    ? ?Assessment/Plan  ?  ?PT Assessment Patient does not need any further PT services  ?PT Problem List   ? ?   ?  ?PT Treatment Interventions     ? ?PT Goals (Current goals can be found in the Care Plan section)  ?Acute Rehab PT Goals ?Patient Stated Goal: to go home ?PT Goal Formulation: With patient ?Time For Goal Achievement: 09/14/21 ?Potential to Achieve Goals: Good ? ?  ?Frequency   ?  ? ? ?Co-evaluation   ?  ?  ?  ?  ? ? ?  ?AM-PAC PT "6 Clicks" Mobility  ?Outcome Measure Help needed turning from your back to your side while in a flat bed without using bedrails?: None ?Help needed moving from lying on your back to sitting on the side of a flat bed without using bedrails?: None ?Help needed moving to and from a bed to a chair (including a wheelchair)?: None ?Help needed standing up from a chair using your arms (e.g., wheelchair or bedside chair)?: None ?Help needed to walk in hospital room?: None ?Help needed climbing 3-5 steps with a railing? : A Little ?6 Click Score: 23 ? ?  ?End of Session Equipment Utilized During Treatment: Gait belt ?Activity Tolerance: Patient tolerated treatment well ?Patient left: in bed;with call bell/phone within reach (on stretcher in ED) ?Nurse Communication: Mobility status ?PT Visit Diagnosis: Other symptoms and signs involving the nervous system (R29.898) ?  ? ?Time: 5093-2671 ?PT Time  Calculation (min) (ACUTE ONLY): 10 min ? ? ?Charges:   PT Evaluation ?$PT Eval Low Complexity: 1 Low ?  ?  ?   ? ? ?Reuel Derby, PT, DPT  ?Acute Rehabilitation Services  ?Pager: [phone] ?Office: [phone] ? ? ?[place] ?09/14/2021, 10:15 AM ? ?

## 2021-09-14 NOTE — Plan of Care (Signed)
  Problem: Education: Goal: Knowledge of General Education information will improve Description Including pain rating scale, medication(s)/side effects and non-pharmacologic comfort measures Outcome: Progressing   

## 2021-09-14 NOTE — Discharge Summary (Signed)
?Physician Discharge Summary ?  ?Patient: Heather Gross MRN: [id number] DOB: 24-Jan-1946  ?Admit date:     09/13/2021  ?Discharge date: 09/14/21  ?Discharge Physician: Edwin Dada  ? ?PCP: [name], NP  ? ? ? ?Recommendations at discharge:  ?Follow-up with PCP [name] in 1 week ?Follow-up with neurology [name] as scheduled ?[name]: Please ensure patient is taking Brilinta for 1 month, followed by Plavix lifelong, as well as pantoprazole in place of omeprazole ?[name]: Please check BP at hospital follow up ? ? ? ? ? ?Discharge Diagnoses: ?Principal Problem: ?  Acute ischemic stroke ([place]) ?Active Problems: ?  Cerebrovascular disease ?  Essential hypertension ?  GASTROESOPHAGEAL REFLUX, NO ESOPHAGITIS ?  Hyperlipidemia ?  Type 2 diabetes mellitus with diabetic chronic kidney disease ([name]) ?  Stage 3a chronic kidney disease (CKD) ([facility]) -Baseline Scr 1.3-1.6 ?  Obesity (BMI 30-39.9) ?  LBBB (left bundle branch block) ? ?  ? ? ?Hospital Course: ?Heather Gross is a 76 year old F with diabetes, chronic kidney disease, and recent stroke who presented with acute ischemic stroke. ? ?Patient was watching television when she developed aphasia again, presented to the emergency room where CT head was unremarkable.  Symptoms were resolving within 15 minutes of arrival to the ER, so tPA not given. ? ?Of note, patient tells me she thinks she was not taking her blood pressure medicines, as she thinks the pharmacist told her that it interacted with one of her other medicines and she had not yet called her PCP. ? ?  ? ? ?Acute ischemic stroke ?Cerebrovascular disease ?MRI brain showed again small thalamic stroke, consistent with small vessel disease.  Patient was evaluated by Stroke neurology, who felt that the cause of her stroke was likely again small vessel disease related to hypertension, did not feel that echocardiogram, loop recorder, TEE would add value to work up.   ? ?Neurology recommended 1 month  Brilinta in addition to aspirin and then Plavix lifelong.   Continue statin. ?-Atrial fibrillation: Not present on monitoring ?-tPA not given because symptoms resolved promptly in the ER ?-Dysphagia screen ordered in ER ?-PT eval ordered: recommended no PT follow-up ?-Smoking cessation: Non-smoker ? ?  ? ?Obesity (BMI 30-39.9) ?BMI 34 ? ? ?GASTROESOPHAGEAL REFLUX, NO ESOPHAGITIS ?Stop omeprazole given plans for lifelong Plavix.  Transition to Protonix. ? ?Essential hypertension ?Patient is confused about her medications, is unsure if she was told by the pharmacist to stop her blood pressure medicines or not.  Strict adherence to blood pressure regimen was recommended ? ? ? ? ?  ? ? ? ? ? ?[facility] Controlled Substances Registry was reviewed for this patient prior to discharge. ? ?Consultants: Neurology, Dr. Erlinda Hong ?Procedures performed: MRI brain ?Disposition: Home ?Diet recommendation:  ?Cardiac and Carb modified diet ? ?DISCHARGE MEDICATION: ?Allergies as of 09/14/2021   ? ?   Reactions  ? Lipitor [atorvastatin] Other (See Comments)  ? Made mouth very dry and the lips discolored  ? Shellfish-derived Products Other (See Comments)  ? Hands break out  ? Latex Rash  ? ?  ? ?  ?Medication List  ?  ? ?STOP taking these medications   ? ?clopidogrel 75 MG tablet ?Commonly known as: PLAVIX ?  ?omeprazole 20 MG capsule ?Commonly known as: PRILOSEC ?  ?simvastatin 80 MG tablet ?Commonly known as: ZOCOR ?  ? ?  ? ?TAKE these medications   ? ?amLODipine 5 MG tablet ?Commonly known as: NORVASC ?Take 1 tablet (5  mg total) by mouth daily. ?  ?aspirin EC 81 MG tablet ?Take 1 tablet (81 mg total) by mouth daily for 21 days. Swallow whole. ?  ?Brilinta 90 MG Tabs tablet ?Generic drug: ticagrelor ?Take 1 tablet (90 mg total) by mouth 2 (two) times daily. ?  ?Calcium 500 MG Chew ?Chew 2 tablets (1,000 mg total) by mouth daily. ?  ?[employer] D3 50 MCG (2000 UT) Caps ?Generic drug: Cholecalciferol ?TAKE 1 CAPSULE BY MOUTH EVERY DAY ?What  changed:  ?how much to take ?when to take this ?  ?doxazosin 1 MG tablet ?Commonly known as: CARDURA ?Take 2 mg by mouth in the morning. ?  ?fluticasone 50 MCG/ACT nasal spray ?Commonly known as: FLONASE ?Place 1-2 sprays into both nostrils daily as needed for allergies or rhinitis. ?  ?hydrochlorothiazide 25 MG tablet ?Commonly known as: HYDRODIURIL ?Take 25 mg by mouth daily. ?  ?lisinopril 40 MG tablet ?Commonly known as: ZESTRIL ?Take 40 mg by mouth daily. ?  ?metFORMIN 500 MG tablet ?Commonly known as: GLUCOPHAGE ?TAKE 1 TABLET BY MOUTH AT BEDTIME. ?What changed: when to take this ?  ?multivitamin tablet ?Take 1 tablet by mouth daily with breakfast. ?  ?pantoprazole 20 MG tablet ?Commonly known as: Protonix ?Take 1 tablet (20 mg total) by mouth daily. ?  ?rosuvastatin 40 MG tablet ?Commonly known as: CRESTOR ?Take 40 mg by mouth daily. ?  ? ?  ? ? Follow-up Information   ? ? Ward Givens, NP. Go on 10/18/2021.   ?Specialty: Neurology ?Contact information: ?[address] ?[address] ?[place] [address] ?[phone] ? ? ?  ?  ? ?  ?  ? ?  ? ? ?Discharge Instructions   ? ? Discharge instructions   Complete by: As directed ?  ? From Dr. Loleta Books: ?You were admitted with another small stroke. ?Thankfully, because of your aspirin and Plavix and Crestor, this was not a bigger stroke ? ?You should do the following: ?For the next month: ?STOP clopidogrel/Plavix and take ticagrelor/Brilinta 90 mg twice daily INSTEAD ? ?After 1 month, stop Brilinta and start clopidogrel/Plavix again ?Plan to take clopidogrel/Plavix life long ? ?Continue your aspirin 81 mg daily and rosuvastatin/Crestor 40 mg nightly from now on ? ? ?For your blood pressure, make sure you take: ?Amlodipine ?HCTZ ?Lisinopril ?Doxazosin ? ? ?Do not stop these, they are important to keep your blood pressure down ? ?Continue your metformin. ? ?For heartburn: ?STOP taking omeprazole/Prilosec ? ?START taking pantoprazole/Protonix  ? ?(Omeprazole may interact  with clopidogrel.  Protonix does not)(Omeprazole is over the counter, Protonix is an equivalent medicine of the same family, but is only prescription) ? ?Make sure you have enough clopidogrel/Plavix to restart this in one month when you finish the Brilinta  ?(Brilinta is a stonger version of the clot preventer Plavix) ? ?Go see the Neurologists Megan Millikin on Jun 20 at [facility] (see below in the To Do section)  ? Increase activity slowly   Complete by: As directed ?  ? ?  ? ? ?Discharge Exam: ?Filed Weights  ? 09/13/21 1812 09/14/21 1000  ?Weight: 94 kg 94 kg  ? ? ?General: Pt is alert, awake, not in acute distress ?Cardiovascular: RRR, nl S1-S2, no murmurs appreciated.   No LE edema.   ?Respiratory: Normal respiratory rate and rhythm.  CTAB without rales or wheezes. ?Abdominal: Abdomen soft and non-tender.  No distension or HSM.   ?Neuro/Psych: Strength symmetric in upper and lower extremities.  Judgment and insight appear mildly impaired  by age-related memory loss. ? ? ?Condition at discharge: good ? ?The results of significant diagnostics from this hospitalization (including imaging, microbiology, ancillary and laboratory) are listed below for reference.  ? ?Imaging Studies: ?MR BRAIN WO CONTRAST ? ?Result Date: 09/13/2021 ?CLINICAL DATA:  Transient ischemic attack EXAM: MRI HEAD WITHOUT CONTRAST TECHNIQUE: Multiplanar, multiecho pulse sequences of the brain and surrounding structures were obtained without intravenous contrast. COMPARISON:  09/01/2021 FINDINGS: Brain: Small focus of subacute ischemia at the base of the right precentral gyrus. Ischemic focus of the left thalamus is of similar age. There is multifocal chronic microhemorrhage in a nonspecific distribution. No acute hemorrhage. There is multifocal hyperintense T2-weighted signal within the white matter. Generalized cerebral volume loss. The midline structures are normal. Vascular: Major flow voids are preserved. Skull and upper cervical spine: Normal  calvarium and skull base. Visualized upper cervical spine and soft tissues are normal. Sinuses/Orbits:No paranasal sinus fluid levels or advanced mucosal thickening. No mastoid or middle ear effusion. Nor

## 2021-09-14 NOTE — TOC Transition Note (Signed)
Transition of Care (TOC) - CM/SW Discharge Note ? ? ?Patient Details  ?Name: Haidee Stogsdill ?MRN: [id number] ?Date of Birth: [birth year] ? ?Transition of Care (TOC) CM/SW Contact:  ?Pollie Friar, RN ?Phone Number: ?09/14/2021, 12:36 PM ? ? ?Clinical Narrative:    ?Patient discharging home with self care. No f/u per PT. Pt lives with her spouse that can provide needed supervision. Pt doesn't use any DME at home. She drives self as needed and manage her own medications.  ?Pt discharging home on Brilinta. The monthly cost is $45 and she states she can afford it.  ?Pt has transportation home today. ? ? ?Final next level of care: Home/Self Care ?Barriers to Discharge: No Barriers Identified ? ? ?Patient Goals and CMS Choice ?  ?  ?  ? ?Discharge Placement ?  ?           ?  ?  ?  ?  ? ?Discharge Plan and Services ?  ?  ?           ?  ?  ?  ?  ?  ?  ?  ?  ?  ?  ? ?Social Determinants of Health (SDOH) Interventions ?  ? ? ?Readmission Risk Interventions ?   ? View : No data to display.  ?  ?  ?  ? ? ? ? ? ?

## 2021-09-14 NOTE — Progress Notes (Addendum)
STROKE TEAM PROGRESS NOTE   INTERVAL HISTORY Patient is seen in her room with no family at the bedside.  Yesterday, she experienced sudden onset garbled speech and confusion, which has since resolved.  MRI reveals small foci of subacute ischemia at base of right precentral gyrus and left thalamus.  Vitals:   09/14/21 0700 09/14/21 0830 09/14/21 0845 09/14/21 1000  BP: (!) 139/95 (!) 157/78  (!) 138/45  Pulse: 77 69 77 84  Resp: 11 15 16 18   Temp:    (!) 97.4 F (36.3 C)  TempSrc:    Oral  SpO2: 100% 97% 97% 100%  Weight:    94 kg  Height:    5\' 5"  (1.651 m)   CBC:  Recent Labs  Lab 09/13/21 1809 09/13/21 1813  WBC 6.7  --   NEUTROABS 4.4  --   HGB 11.9* 12.6  HCT 36.5 37.0  MCV 96.3  --   PLT 157  --    Basic Metabolic Panel:  Recent Labs  Lab 09/13/21 1809 09/13/21 1813  NA 140 140  K 4.3 4.2  CL 107 105  CO2 24  --   GLUCOSE 125* 125*  BUN 21 25*  CREATININE 1.63* 1.50*  CALCIUM 9.5  --    Lipid Panel: No results for input(s): CHOL, TRIG, HDL, CHOLHDL, VLDL, LDLCALC in the last 168 hours. HgbA1c: No results for input(s): HGBA1C in the last 168 hours. Urine Drug Screen: No results for input(s): LABOPIA, COCAINSCRNUR, LABBENZ, AMPHETMU, THCU, LABBARB in the last 168 hours.  Alcohol Level  Recent Labs  Lab 09/13/21 1930  ETH <10    IMAGING past 24 hours MR BRAIN WO CONTRAST  Result Date: 09/13/2021 CLINICAL DATA:  Transient ischemic attack EXAM: MRI HEAD WITHOUT CONTRAST TECHNIQUE: Multiplanar, multiecho pulse sequences of the brain and surrounding structures were obtained without intravenous contrast. COMPARISON:  09/01/2021 FINDINGS: Brain: Small focus of subacute ischemia at the base of the right precentral gyrus. Ischemic focus of the left thalamus is of similar age. There is multifocal chronic microhemorrhage in a nonspecific distribution. No acute hemorrhage. There is multifocal hyperintense T2-weighted signal within the white matter. Generalized cerebral  volume loss. The midline structures are normal. Vascular: Major flow voids are preserved. Skull and upper cervical spine: Normal calvarium and skull base. Visualized upper cervical spine and soft tissues are normal. Sinuses/Orbits:No paranasal sinus fluid levels or advanced mucosal thickening. No mastoid or middle ear effusion. Normal orbits. IMPRESSION: 1. Small foci of subacute ischemia at the base of the right precentral gyrus and within the left thalamus. No hemorrhage or mass effect. 2. 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 Electronically Signed   By: Deatra Robinson M.D.   On: 09/13/2021 23:11   CT HEAD CODE STROKE WO CONTRAST  Result Date: 09/13/2021 CLINICAL DATA:  Code stroke.  Neuro deficit, acute, stroke suspected EXAM: CT HEAD WITHOUT CONTRAST TECHNIQUE: Contiguous axial images were obtained from the base of the skull through the vertex without intravenous contrast. RADIATION DOSE REDUCTION: This exam was performed according to the departmental dose-optimization program which includes automated exposure control, adjustment of the mA and/or kV according to patient size and/or use of iterative reconstruction technique. COMPARISON:  09/01/2021 FINDINGS: Brain: No acute intracranial hemorrhage, mass effect, or edema. No new loss of gray-white differentiation. The small recent infarct on MRI brain is not well seen on CT. Several chronic small vessel infarcts are again identified with involvement of the central white matter and deep gray nuclei. There  is a chronic small vessel infarct of the right parasagittal pons. Extensive chronic microvascular ischemic changes are again noted Vascular: No hyperdense vessel. Intracranial atherosclerotic calcification is present at the skull base. Skull: Unremarkable. Sinuses/Orbits: No acute finding. Other: None. ASPECTS (Alberta Stroke Program Early CT Score) - Ganglionic level infarction (caudate, lentiform nuclei, internal capsule, insula, M1-M3  cortex): 7 - Supraganglionic infarction (M4-M6 cortex): 3 Total score (0-10 with 10 being normal): 10 IMPRESSION: There is no acute intracranial hemorrhage or evidence of acute infarction. ASPECT score is 10. These results were communicated to Dr. Otelia Limes at 6:22 pm on 09/13/2021 by text page via the [facility] messaging system. Electronically Signed   By: Guadlupe Spanish M.D.   On: 09/13/2021 18:22   CT ANGIO HEAD NECK W WO CM W PERF (CODE STROKE)  Result Date: 09/13/2021 CLINICAL DATA:  Slurred speech, history of stroke EXAM: CT ANGIOGRAPHY HEAD AND NECK CT PERFUSION BRAIN TECHNIQUE: Multidetector CT imaging of the head and neck was performed using the standard protocol during bolus administration of intravenous contrast. Multiplanar CT image reconstructions and MIPs were obtained to evaluate the vascular anatomy. Carotid stenosis measurements (when applicable) are obtained utilizing NASCET criteria, using the distal internal carotid diameter as the denominator. Multiphase CT imaging of the brain was performed following IV bolus contrast injection. Subsequent parametric perfusion maps were calculated using RAPID software. RADIATION DOSE REDUCTION: This exam was performed according to the departmental dose-optimization program which includes automated exposure control, adjustment of the mA and/or kV according to patient size and/or use of iterative reconstruction technique. CONTRAST:  OMNIPAQUE IOHEXOL 350 MG/ML SOLN COMPARISON:  09/01/2021 CTA head neck, 09/13/2021 CT head FINDINGS: CT HEAD FINDINGS For noncontrast findings, please see same day CT head. CTA NECK FINDINGS Aortic arch: Two-vessel arch with a common origin of the brachiocephalic and left common carotid arteries. Imaged portion shows no evidence of aneurysm or dissection. No significant stenosis of the major arch vessel origins. Right carotid system: No evidence of dissection, occlusion, or hemodynamically significant stenosis (greater than 50%).  Left carotid system: No evidence of dissection, occlusion, or hemodynamically significant stenosis (greater than 50%). Vertebral arteries: No evidence of dissection, occlusion, or hemodynamically significant stenosis (greater than 50%). Skeleton: Degenerative changes in the cervical spine. No acute osseous abnormality. Other neck: Acute finding. Upper chest: No focal pulmonary opacity or pleural effusion. Review of the MIP images confirms the above findings CTA HEAD FINDINGS Anterior circulation: Both internal carotid arteries are patent to the termini, with calcifications in the cavernous and supraclinoid portions causing up to moderate stenosis in the distal cavernous and supraclinoid ICA and in the supraclinoid left ICA. A1 segments patent. Normal anterior communicating artery. Redemonstrated moderate to severe stenosis in the A3 segments bilaterally, unchanged from the prior exam. Anterior cerebral arteries are otherwise patent to their distal aspects. No M1 stenosis or occlusion. Normal MCA bifurcations. Multifocal narrowing in the MCA branches, as seen on the prior exam distal MCA branches perfused and symmetric. Posterior circulation: Vertebral arteries patent to the vertebrobasilar junction without stenosis. Posterior inferior cerebral arteries patent bilaterally. Basilar patent to its distal aspect. Superior cerebellar arteries patent bilaterally. Patent P1 segments, diminutive on the right, with near fetal origin of the right PCA. PCAs perfused to their distal aspects without stenosis. The left posterior communicating artery is not visualized. Venous sinuses: As permitted by contrast timing, patent. Anatomic variants: Near fetal origin of the right PCA. Review of the MIP images confirms the above findings CT Brain Perfusion Findings:  ASPECTS: 10 CBF (<30%) Volume: 0mL Perfusion (Tmax>6.0s) volume: 0mL Mismatch Volume: 0mL Infarction Location:None IMPRESSION: 1. No intracranial large vessel occlusion.  Multifocal narrowing, as described above. 2.  No hemodynamically significant stenosis in the neck. 3. No infarct core or penumbra on CT perfusion. Electronically Signed   By: Wiliam Ke M.D.   On: 09/13/2021 18:54    PHYSICAL EXAM General:  Alert, well-nourished, well-developed patient in no acute distress Respiratory:  Regular, unlabored respirations on room air  NEURO:  Mental Status: AA&Ox3  Speech/Language: speech is without dysarthria or aphasia.  Naming, repetition, fluency, and comprehension intact.  Cranial Nerves:  II: PERRL.  III, IV, VI: EOMI. Eyelids elevate symmetrically.  V: Sensation is intact to light touch and symmetrical to face.  VII: Smile is symmetrical.   VIII: hearing intact to voice. IX, X: Phonation is normal.  SW:FUXNATFT shrug 5/5. XII: tongue is midline without fasciculations. Motor: 5/5 strength to all muscle groups tested.  Tone: is normal and bulk is normal Sensation- Intact to light touch bilaterally.  Coordination: FTN intact bilaterally.No drift.  Gait- deferred   ASSESSMENT/PLAN Ms. Heather Gross is a 76 y.o. female with history of recent stroke on 5/4, DM, CKD, HTN and HLD presenting with sudden onset garbled speech and confusion, which has since resolved.  MRI reveals small foci of subacute ischemia at base of right precentral gyrus and left thalamus.  Stroke: possible new acute left thalamus this admission likely also secondary to small vessel disease Code Stroke CT head No acute abnormality. ASPECTS 10.    CTA head & neck moderate stenosis in bilateral supraclinoid ICA, severe stenosis in bilateral A3 segments,multifocal narrowing in MCA branches CT perfusion no infarct core or penumbra MRI  small foci of subacute ischemia at base of right precentral gyrus and left thalamus, chronic ischemic microangiopathy 2D Echo EF 55-60%, no atrial level shunt LDL 60 HgbA1c 6.5 VTE prophylaxis - SQH aspirin 81 mg daily and clopidogrel 75 mg daily  prior to admission, now on aspirin 81 mg daily and Brilinta (ticagrelor) 90 mg bid for one month and then back to plavix alone.   Therapy recommendations:  no PT follow up Disposition:  home  Hx of stroke 09/01/2021 admitted for fall and difficulty speaking.  CT head no acute abnormality.  CT head and neck bilateral ICA siphon moderate stenosis, right M2 stenosis and bilateral A3 stenosis.  MRI showed right frontoparietal white matter small subacute infarct.  EF 55 to 60%, LDL 60, A1c 6.5.  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  Discharged on DAPT for 3 weeks and then Plavix alone.  Continue Zocor 80. Post discharge follow-up with PCP, sobriety changed to Crestor 40.  Hypertension Home meds:  amlodipine 5 mg daily, doxazosin 2 mg daily, lisinopril 40 mg daily Stable Long-term BP goal normotensive  Hyperlipidemia Home meds:  rosuvastatin 40 mg daily, resumed in hospital LDL 60, goal < 70 Continue statin at discharge  Diabetes type II Controlled Home meds:  metformin 500 mg daily HgbA1c 6.5, goal < 7.0 CBGs SSI  Other Stroke Risk Factors Advanced Age >/= 24  Former Cigarette smoker Obesity, Body mass index is 34.49 kg/m., BMI >/= 30 associated with increased stroke risk, recommend weight loss, diet and exercise as appropriate   Other Active Problems CKD Avoid contrast when possible Renally does medications as appropriate  Hospital day # 0  Cortney E Ernestina Columbia , MSN, AGACNP-BC [facility] See Amion for schedule and pager information 09/14/2021 1:21 PM  ATTENDING NOTE: I reviewed above note and agree with the assessment and plan. Pt was seen and examined.   76 year old female with history of diabetes, hypertension, hyperlipidemia, CKD 3A, recent stroke 09/01/2021 admitted for confusion and difficulty speaking.  CT negative.  CT head and neck again showed intracranial stenosis.  MRI questionable/possible left thalamic infarct, and previous subacute right frontal  parietal white matter small infarct.  Creatinine 1.63-1.50.  On 09/01/2021 admitted for fall and difficulty speaking.  CT head no acute abnormality.  CT head and neck bilateral ICA siphon moderate stenosis, right M2 stenosis and bilateral A3 stenosis.  MRI showed right frontoparietal white matter small subacute infarct.  EF 55 to 60%, LDL 60, A1c 6.5.  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  Discharged on DAPT for 3 weeks and then Plavix alone.  Continue Zocor 80. Post discharge follow-up with PCP, sobriety changed to Crestor 40.  On exam, patient neuro intact, walking in the room, going in and out of bathroom without difficulty.  Etiology for patient current stroke still more consistent with small vessel disease.  Now change antiplatelet to aspirin and Brilinta for 30 days and then back to Plavix.  Continue Crestor.  PT/OT no recommendation.  Aggressive risk factor modification.  Patient will see NP Millikan at [facility] on 10/18/2021.  For detailed assessment and plan, please refer to above as I have made changes wherever appropriate.   Neurology will sign off. Please call with questions. Pt will follow up with stroke clinic NP Millikan at [facility] on 10/18/2021. Thanks for the consult.   Marvel Plan, MD PhD Stroke Neurology 09/14/2021 4:03 PM        To contact Stroke Continuity provider, please refer to [email or web address]. After hours, contact General Neurology

## 2021-09-14 NOTE — TOC CAGE-AID Note (Signed)
Transition of Care (TOC) - CAGE-AID Screening ? ? ?Patient Details  ?Name: Heather Gross ?MRN: [id number] ?Date of Birth: 1945/12/30 ? ?Transition of Care (TOC) CM/SW Contact:    ?Lyndell Allaire C Tarpley-Carter, LCSWA ?Phone Number: ?09/14/2021, 1:36 PM ? ? ?Clinical Narrative: ?Pt participated in [place].  Pt stated she does not use substance or ETOH.  Pt was not offered resources, due to no usage of substance or ETOH.    ? ?Passenger transport manager, MSW, LCSW-A ?Pronouns:  She/Her/Hers ?[facility] ?Clinical Social Worker ?Direct Number:  [phone] ?[name] ? ?CAGE-AID Screening: ?  ? ?Have You Ever Felt You Ought to Cut Down on Your Drinking or Drug Use?: No ?Have People Annoyed You By Critizing Your Drinking Or Drug Use?: No ?Have You Felt Bad Or Guilty About Your Drinking Or Drug Use?: No ?Have You Ever Had a Drink or Used Drugs First Thing In The Morning to Steady Your Nerves or to Get Rid of a Hangover?: No ?CAGE-AID Score: 0 ? ?Substance Abuse Education Offered: No ? ?  ? ? ? ? ? ? ?

## 2021-09-14 NOTE — ED Notes (Signed)
Pt ambulatory to bathroom

## 2021-09-14 NOTE — Progress Notes (Signed)
PIV removed. Discharge instructions completed. Patient verbalized understanding of medication regimen, follow up appointments and discharge instructions. Patient belongings gathered and packed to discharge.  Waiting on meds from [place] and patient's ride ? ? ?

## 2021-09-16 SURGERY — ECHOCARDIOGRAM, TRANSESOPHAGEAL
Anesthesia: Monitor Anesthesia Care

## 2021-09-28 ENCOUNTER — Ambulatory Visit (HOSPITAL_BASED_OUTPATIENT_CLINIC_OR_DEPARTMENT_OTHER): Payer: Medicare HMO | Admitting: Cardiovascular Disease

## 2021-09-28 ENCOUNTER — Encounter (HOSPITAL_BASED_OUTPATIENT_CLINIC_OR_DEPARTMENT_OTHER): Payer: Self-pay | Admitting: Cardiovascular Disease

## 2021-09-28 VITALS — BP 122/84 | HR 86 | Ht 65.0 in | Wt 192.1 lb

## 2021-09-28 DIAGNOSIS — I10 Essential (primary) hypertension: Secondary | ICD-10-CM | POA: Diagnosis not present

## 2021-09-28 DIAGNOSIS — E785 Hyperlipidemia, unspecified: Secondary | ICD-10-CM

## 2021-09-28 DIAGNOSIS — I639 Cerebral infarction, unspecified: Secondary | ICD-10-CM | POA: Diagnosis not present

## 2021-09-28 MED ORDER — OLMESARTAN-AMLODIPINE-HCTZ 40-5-25 MG PO TABS: 1.0000 | ORAL_TABLET | Freq: Every day | ORAL | 3 refills | Status: DC

## 2021-09-28 NOTE — Progress Notes (Signed)
Cardiology Office Note   Date:  09/29/2021   ID:  Heather Gross, DOB 09/02/1945, MRN [id number]  PCP:  [name], NP  Cardiologist:   Skeet Latch, MD   No chief complaint on file.   History of Present Illness: Heather Gross is a 76 y.o. female with hypertension, hyperlipidemia, CKD 3, LBBB, obesity, and recent stroke who is being seen today for the evaluation of stroke at the request of [name], NP.  She was recently seen twice 08/2021 with a TIA and then a stroke.  The first episode she developed transient aphasia.  Head CT was unremarkable.  On the second episode she had a small thalamic stroke consistent with small vessel disease.  Per neurology it was thought to be more small vessel related to hypertension and therefore did not need a loop recorder or TEE.  They recommended 1 month of ticagrelor followed by aspirin and clopidogrel long-term.  Symptoms promptly resolved and tPA was not given.  There was some confusion about her blood pressure medications as she had been told by pharmacist that she should not take her medicine due to a possible interaction.  She was discharged on amlodipine, HCTZ, and lisinopril.  Echo that hospitalization revealed LVEF 55 to 60% with moderate LVH and indeterminate diastolic function.  Lately [name] has been feeling well.  She is eager to get back to work.  She struggles with her medication regimen and is unsure exactly what she is taking.  She notes that amlodipine was discontinued in the past but she is unsure why.  She does not recall having any allergies to amlodipine.   She has no chest pain or shortness of breath. Her word finding difficulty is slowly improving.   Past Medical History:  Diagnosis Date   Glaucoma    Hyperlipidemia    Hypertension     Past Surgical History:  Procedure Laterality Date   BREAST BIOPSY Right 09/29/2015   BREAST EXCISIONAL BIOPSY Right 11/04/2015   papilloma   BREAST LUMPECTOMY WITH RADIOACTIVE SEED  LOCALIZATION Right 11/04/2015   Procedure: BREAST LUMPECTOMY WITH RADIOACTIVE SEED LOCALIZATION;  Surgeon: Autumn Messing III, MD;  Location: [facility];  Service: General;  Laterality: Right;   LIPOMA EXCISION       Current Outpatient Medications  Medication Sig Dispense Refill   aspirin EC 81 MG tablet Take 81 mg by mouth daily. Swallow whole.     Calcium 500 MG CHEW Chew 2 tablets (1,000 mg total) by mouth daily. 30 tablet 0   [employer] D3 2000 units CAPS TAKE 1 CAPSULE BY MOUTH EVERY DAY (Patient taking differently: Take 2,000 Units by mouth daily after breakfast.) 100 capsule 2   ezetimibe (ZETIA) 10 MG tablet Take 10 mg by mouth daily.     fluticasone (FLONASE) 50 MCG/ACT nasal spray Place 1-2 sprays into both nostrils daily as needed for allergies or rhinitis.     metFORMIN (GLUCOPHAGE) 500 MG tablet TAKE 1 TABLET BY MOUTH AT BEDTIME. (Patient taking differently: Take 500 mg by mouth daily with breakfast.) 30 tablet 0   Multiple Vitamin (MULTIVITAMIN) tablet Take 1 tablet by mouth daily with breakfast.     Olmesartan-amLODIPine-HCTZ 40-5-25 MG TABS Take 1 tablet by mouth daily. 90 tablet 3   pantoprazole (PROTONIX) 20 MG tablet Take 1 tablet (20 mg total) by mouth daily. 30 tablet 3   rosuvastatin (CRESTOR) 40 MG tablet Take 40 mg by mouth daily.     ticagrelor (BRILINTA) 90 MG  TABS tablet Take 1 tablet (90 mg total) by mouth 2 (two) times daily. 60 tablet 0   No current facility-administered medications for this visit.    Allergies:   Lipitor [atorvastatin], Shellfish-derived products, and Latex    Social History:  The patient  reports that she quit smoking about 31 years ago. She has never used smokeless tobacco. She reports that she does not drink alcohol and does not use drugs.   Family History:  The patient's family history includes Breast cancer in her sister; Diabetes in her brother, sister, sister, sister, sister, and sister; Heart disease in her father.    ROS:   Please see the history of present illness.   Otherwise, review of systems are positive for none.   All other systems are reviewed and negative.    PHYSICAL EXAM: VS:  BP 122/84 (BP Location: Right Arm, Patient Position: Sitting, Cuff Size: Normal)   Pulse 86   Ht '5\' 5"'$  (1.651 m)   Wt 192 lb 1.6 oz (87.1 kg)   SpO2 98%   BMI 31.97 kg/m  , BMI Body mass index is 31.97 kg/m. GENERAL:  Well appearing HEENT:  Pupils equal round and reactive, fundi not visualized, oral mucosa unremarkable NECK:  No jugular venous distention, waveform within normal limits, carotid upstroke brisk and symmetric, no bruits, no thyromegaly LYMPHATICS:  No cervical adenopathy LUNGS:  Clear to auscultation bilaterally HEART:  RRR.  PMI not displaced or sustained,S1 and S2 within normal limits, no S3, no S4, no clicks, no rubs, no murmurs ABD:  Flat, positive bowel sounds normal in frequency in pitch, no bruits, no rebound, no guarding, no midline pulsatile mass, no hepatomegaly, no splenomegaly EXT:  2 plus pulses throughout, no edema, no cyanosis no clubbing SKIN:  No rashes no nodules NEURO:  Cranial nerves II through XII grossly intact, motor grossly intact throughout PSYCH:  Cognitively intact, oriented to person place and time   EKG:  EKG is not ordered today.  Echo 09/01/21:  1. Left ventricular ejection fraction, by estimation, is 55 to 60%. The  left ventricle has normal function. The left ventricle has no regional  wall motion abnormalities. There is moderate left ventricular hypertrophy.  Left ventricular diastolic  parameters are indeterminate.   2. Right ventricular systolic function is normal. The right ventricular  size is normal.   3. The mitral valve is normal in structure. No evidence of mitral valve  regurgitation.   4. The aortic valve was not well visualized. Aortic valve regurgitation  is not visualized.   5. The inferior vena cava not well vizualized.    Recent Labs: 09/01/2021: TSH  0.768 09/13/2021: ALT 13; BUN 25; Creatinine, Ser 1.50; Hemoglobin 12.6; Platelets 157; Potassium 4.2; Sodium 140    Lipid Panel    Component Value Date/Time   CHOL 114 09/01/2021 1905   TRIG 86 09/01/2021 1905   HDL 37 (L) 09/01/2021 1905   CHOLHDL 3.1 09/01/2021 1905   VLDL 17 09/01/2021 1905   LDLCALC 60 09/01/2021 1905   LDLCALC 93 12/30/2019 0821   LDLDIRECT 88.9 08/13/2006 1412      Wt Readings from Last 3 Encounters:  09/28/21 192 lb 1.6 oz (87.1 kg)  09/14/21 207 lb 3.7 oz (94 kg)  09/01/21 198 lb 13.7 oz (90.2 kg)      ASSESSMENT AND PLAN:  Essential hypertension Blood pressure initially at goal in that high on repeat.  She is somewhat unclear what she is taking but wants to try  and simplify her regimen.  We will switch her to olmesartan/amlodipine/HCTZ 40/5/20 5 mg.  We will stop lisinopril, hydrochlorothiazide, and doxazosin.  We will ask her to track her blood pressures, write them down, and bring her log back to follow-up in 1 month.     Current medicines are reviewed at length with the patient today.  The patient does not have concerns regarding medicines.  The following changes have been made:  stop doxazosin, lisinopril, and HCTZ.  Labs/ tests ordered today include:  No orders of the defined types were placed in this encounter.   Disposition:   FU with PharmD in 1 month.  Leeam Cedrone C. Oval Linsey, MD, [facility] in 6 months.      Signed, Nycholas Rayner C. Oval Linsey, MD, [facility]  09/29/2021 11:07 AM    [place]

## 2021-09-28 NOTE — Patient Instructions (Addendum)
Medication Instructions:  STOP LISINOPRIL   STOP HYDROCHLOROTHIAZIDE   STOP DOXAZOSIN   START OLMESARTAN-AMLODIPINE-HCTZ 40-5-25 MG DAILY   *If you need a refill on your cardiac medications before your next appointment, please call your pharmacy*  Lab Work: NONE  Testing/Procedures: NONE  Follow-Up: At [facility], you and your health needs are our priority.  As part of our continuing mission to provide you with exceptional heart care, we have created designated Provider Care Teams.  These Care Teams include your primary Cardiologist (physician) and Advanced Practice Providers (APPs -  Physician Assistants and Nurse Practitioners) who all work together to provide you with the care you need, when you need it.  We recommend signing up for the patient portal called "MyChart".  Sign up information is provided on this After Visit Summary.  MyChart is used to connect with patients for Virtual Visits (Telemedicine).  Patients are able to view lab/test results, encounter notes, upcoming appointments, etc.  Non-urgent messages can be sent to your provider as well.   To learn more about what you can do with MyChart, go to [email or web address].    Your next appointment:   6 month(s)  The format for your next appointment:   In Person  Provider:   Skeet Latch, MD   PHARM D IN 1 MONTH   Other Instructions w BOOK AND YOUR BLOOD PRESSURE MACHINE TO YOUR FOLLOW UP IN 1 MONTH

## 2021-10-05 ENCOUNTER — Telehealth (HOSPITAL_BASED_OUTPATIENT_CLINIC_OR_DEPARTMENT_OTHER): Payer: Self-pay

## 2021-10-05 ENCOUNTER — Other Ambulatory Visit (HOSPITAL_BASED_OUTPATIENT_CLINIC_OR_DEPARTMENT_OTHER): Payer: Self-pay

## 2021-10-05 ENCOUNTER — Other Ambulatory Visit (HOSPITAL_COMMUNITY): Payer: Self-pay

## 2021-10-05 ENCOUNTER — Other Ambulatory Visit (HOSPITAL_BASED_OUTPATIENT_CLINIC_OR_DEPARTMENT_OTHER): Payer: Self-pay | Admitting: Registered Nurse

## 2021-10-05 NOTE — Telephone Encounter (Signed)
Pharmacy Transitions of Care Follow-up Telephone Call  Date of discharge: 09/14/21  Discharge Diagnosis: stroke  How have you been since you were released from the hospital? Patient has been doing well since leaving the hospital. She has had a follow up appointment this week and last week and neither mentioned switching from Brilinta to Plavix. Message has been sent to Dr. Maudie Mercury to see if it is appropriate to send a new rx [facility] [address].   Medication changes made at discharge: START taking these medications  START taking these medications  Brilinta 90 MG Tabs tablet Generic drug: ticagrelor Take 1 tablet (90 mg total) by mouth 2 (two) times daily.  pantoprazole 20 MG tablet Commonly known as: Protonix Take 1 tablet (20 mg total) by mouth daily.   CHANGE how you take these medications  CHANGE how you take these medications  [facility] D3 50 MCG (2000 UT) Caps Generic drug: Cholecalciferol TAKE 1 CAPSULE BY MOUTH EVERY DAY What changed:  how much to take when to take this  metFORMIN 500 MG tablet Commonly known as: GLUCOPHAGE TAKE 1 TABLET BY MOUTH AT BEDTIME. What changed: when to take this   CONTINUE taking these medications  CONTINUE taking these medications  Calcium 500 MG Chew Chew 2 tablets (1,000 mg total) by mouth daily.  fluticasone 50 MCG/ACT nasal spray Commonly known as: FLONASE  multivitamin tablet  rosuvastatin 40 MG tablet Commonly known as: CRESTOR   STOP taking these medications  STOP taking these medications  clopidogrel 75 MG tablet Commonly known as: PLAVIX  omeprazole 20 MG capsule Commonly known as: PRILOSEC  simvastatin 80 MG tablet Commonly known as: ZOCOR    Medication changes verified by the patient? Yes    Medication Accessibility:  [facility] [phone]   Was the patient provided with refills on discharged medications? yes   Have all prescriptions been transferred from [facility] to home pharmacy? yes     Medication  Review:  TICAGRELOR (BRILINTA) Ticagrelor 90 mg BID for 1 month, then, clopidogrel indefinitely. - Discussed importance of taking medication around the same time every day, - Reviewed potential DDIs with patient - Advised patient of medications to avoid (NSAIDs, aspirin maintenance doses>100 mg daily) - Educated that Tylenol (acetaminophen) will be the preferred analgesic to prevent risk of bleeding  - Emphasized importance of monitoring for signs and symptoms of bleeding (abnormal bruising, prolonged bleeding, nose bleeds, bleeding from gums, discolored urine, black tarry stools)  - Educated patient to notify doctor if shortness of breath or abnormal heartbeat occur - Advised patient to alert all providers of antiplatelet therapy prior to starting a new medication or having a procedure   Follow-up Appointments:   Date Visit Type Length Department    10/18/2021  9:00 AM HOSPITAL FU 30 min [facility] [[id number]]  Patient Instructions:   Please arrive 15 minutes prior to your appointment.         10/28/2021 10:00 AM OFFICE VISIT 30 min [facility] [[id number]]    If their condition worsens, is the pt aware to call PCP or go to the Emergency Dept.? Yes  Final Patient Assessment: Patient has been doing well since leaving the hospital. She has had a follow up appointment this week and last week and neither mentioned switching from Brilinta to Plavix. Message has been sent to Dr. Maudie Mercury to see if it is appropriate to [facility] a [facility] [address].   Darcus Austin, PharmD Clinical Pharmacist  [place] 10/05/2021 11:04 AM

## 2021-10-17 NOTE — Progress Notes (Signed)
PATIENT: Heather Gross DOB: [birth year]  REASON FOR VISIT: follow up HISTORY FROM: patient PRIMARY NEUROLOGIST: Dr. Pearlean Brownie  Chief Complaint  Patient presents with   Hospitalization Follow-up    Rm 20. Accompanied by husband, [name]. Pt c/o low energy and lack of appetite.     HISTORY OF PRESENT ILLNESS: Today 10/17/21  HISTORY Ms. Heather Gross is a 76 y.o. female with history of recent stroke on 5/4, DM, CKD, HTN and HLD presenting with sudden onset garbled speech and confusion, which has since resolved.  MRI reveals small foci of subacute ischemia at base of right precentral gyrus and left thalamus.   Stroke: possible new acute left thalamus this admission likely also secondary to small vessel disease Code Stroke CT head No acute abnormality. ASPECTS 10.    CTA head & neck moderate stenosis in bilateral supraclinoid ICA, severe stenosis in bilateral A3 segments,multifocal narrowing in MCA branches CT perfusion no infarct core or penumbra MRI  small foci of subacute ischemia at base of right precentral gyrus and left thalamus, chronic ischemic microangiopathy 2D Echo EF 55-60%, no atrial level shunt LDL 60 HgbA1c 6.5 VTE prophylaxis - SQH aspirin 81 mg daily and clopidogrel 75 mg daily prior to admission, now on aspirin 81 mg daily and Brilinta (ticagrelor) 90 mg bid for one month and then back to plavix alone.   Therapy recommendations:  no PT follow up Disposition:  home   Hx of stroke 09/01/2021 admitted for fall and difficulty speaking.  CT head no acute abnormality.  CT head and neck bilateral ICA siphon moderate stenosis, right M2 stenosis and bilateral A3 stenosis.  MRI showed right frontoparietal white matter small subacute infarct.  EF 55 to 60%, LDL 60, A1c 6.5.  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  Discharged on DAPT for 3 weeks and then Plavix alone.  Continue Zocor 80. Post discharge follow-up with PCP, sobriety changed to Crestor 40.    Today 10/18/2021 Still  feeling tired, no energy. But denies any stroke-like symptoms. No weakness in extremities. Right arm feels cold at night. No trouble with speech. Husband noticed sometimes her speech is slurred since stroke but nothing consistent. Denies feeling depressed. Continues on aspirin and brillinta. Has FU with [facility]care at the end of the month.  REVIEW OF SYSTEMS: Out of a complete 14 system review of symptoms, the patient complains only of the following symptoms, and all other reviewed systems are negative.  ALLERGIES: Allergies  Allergen Reactions   Lipitor [Atorvastatin] Other (See Comments)    Made mouth very dry and the lips discolored   Shellfish-Derived Products Other (See Comments)    Hands break out   Latex Rash    HOME MEDICATIONS: Outpatient Medications Prior to Visit  Medication Sig Dispense Refill   aspirin EC 81 MG tablet Take 81 mg by mouth daily. Swallow whole.     Calcium 500 MG CHEW Chew 2 tablets (1,000 mg total) by mouth daily. 30 tablet 0   [facility] D3 2000 units CAPS TAKE 1 CAPSULE BY MOUTH EVERY DAY (Patient taking differently: Take 2,000 Units by mouth daily after breakfast.) 100 capsule 2   ezetimibe (ZETIA) 10 MG tablet Take 10 mg by mouth daily.     fluticasone (FLONASE) 50 MCG/ACT nasal spray Place 1-2 sprays into both nostrils daily as needed for allergies or rhinitis.     metFORMIN (GLUCOPHAGE) 500 MG tablet TAKE 1 TABLET BY MOUTH AT BEDTIME. (Patient taking differently: Take 500 mg by mouth daily with breakfast.)  30 tablet 0   Multiple Vitamin (MULTIVITAMIN) tablet Take 1 tablet by mouth daily with breakfast.     Olmesartan-amLODIPine-HCTZ 40-5-25 MG TABS Take 1 tablet by mouth daily. 90 tablet 3   pantoprazole (PROTONIX) 20 MG tablet Take 1 tablet (20 mg total) by mouth daily. 30 tablet 3   rosuvastatin (CRESTOR) 40 MG tablet Take 40 mg by mouth daily.     ticagrelor (BRILINTA) 90 MG TABS tablet Take 1 tablet (90 mg total) by mouth 2 (two) times daily. 60 tablet 0    No facility-administered medications prior to visit.    PAST MEDICAL HISTORY: Past Medical History:  Diagnosis Date   Glaucoma    Hyperlipidemia    Hypertension     PAST SURGICAL HISTORY: Past Surgical History:  Procedure Laterality Date   BREAST BIOPSY Right 09/29/2015   BREAST EXCISIONAL BIOPSY Right 11/04/2015   papilloma   BREAST LUMPECTOMY WITH RADIOACTIVE SEED LOCALIZATION Right 11/04/2015   Procedure: BREAST LUMPECTOMY WITH RADIOACTIVE SEED LOCALIZATION;  Surgeon: Chevis Pretty III, MD;  Location: [facility];  Service: General;  Laterality: Right;   LIPOMA EXCISION      FAMILY HISTORY: Family History  Problem Relation Age of Onset   Heart disease Father    Diabetes Sister    Breast cancer Sister    Diabetes Sister    Diabetes Sister    Diabetes Sister    Diabetes Sister    Diabetes Brother     SOCIAL HISTORY: Social History   Socioeconomic History   Marital status: Married    Spouse name: Not on file   Number of children: Not on file   Years of education: Not on file   Highest education level: Not on file  Occupational History   Not on file  Tobacco Use   Smoking status: Former    Types: Cigarettes    Quit date: 05/01/1990    Years since quitting: 31.4   Smokeless tobacco: Never  Substance and Sexual Activity   Alcohol use: No   Drug use: No   Sexual activity: Not Currently  Other Topics Concern   Not on file  Social History Narrative   Entered 06/2014:    Married x 48 years.   2 Children--2 sons   Social Determinants of Health   Financial Resource Strain: Low Risk  (10/24/2019)   Overall Financial Resource Strain (CARDIA)    Difficulty of Paying Living Expenses: Not very hard  Food Insecurity: Not on file  Transportation Needs: Not on file  Physical Activity: Not on file  Stress: Not on file  Social Connections: Not on file  Intimate Partner Violence: Not on file      PHYSICAL EXAM  Vitals:   10/18/21 0857  BP: 104/65   Pulse: (!) 104  Weight: 188 lb 12.8 oz (85.6 kg)  Height: 5\' 5"  (1.651 m)   Body mass index is 31.42 kg/m.  Generalized: Well developed, in no acute distress   Neurological examination  Mentation: Alert oriented to time, place, history taking. Follows all commands speech and language fluent Cranial nerve II-XII: Pupils were equal round reactive to light. Extraocular movements were full, visual field were full on confrontational test. Facial sensation and strength were normal.  Head turning and shoulder shrug  were normal and symmetric. Motor: The motor testing reveals 5 over 5 strength of all 4 extremities. Good symmetric motor tone is noted throughout.  Sensory: Sensory testing is intact to soft touch on all 4 extremities. No  evidence of extinction is noted.  Coordination: Cerebellar testing reveals good finger-nose-finger and heel-to-shin bilaterally.  Gait and station: Gait is normal. Tandem gait not attempted .  Reflexes: Deep tendon reflexes are symmetric and normal bilaterally.   DIAGNOSTIC DATA (LABS, IMAGING, TESTING) - I reviewed patient records, labs, notes, testing and imaging myself where available.  Lab Results  Component Value Date   WBC 6.7 09/13/2021   HGB 12.6 09/13/2021   HCT 37.0 09/13/2021   MCV 96.3 09/13/2021   PLT 157 09/13/2021      Component Value Date/Time   NA 140 09/13/2021 1813   K 4.2 09/13/2021 1813   CL 105 09/13/2021 1813   CO2 24 09/13/2021 1809   GLUCOSE 125 (H) 09/13/2021 1813   BUN 25 (H) 09/13/2021 1813   CREATININE 1.50 (H) 09/13/2021 1813   CREATININE 1.12 (H) 12/30/2019 0821   CALCIUM 9.5 09/13/2021 1809   PROT 7.1 09/13/2021 1809   ALBUMIN 4.0 09/13/2021 1809   AST 22 09/13/2021 1809   ALT 13 09/13/2021 1809   ALKPHOS 50 09/13/2021 1809   BILITOT 0.2 (L) 09/13/2021 1809   GFRNONAA 33 (L) 09/13/2021 1809   GFRNONAA 49 (L) 12/30/2019 0821   GFRAA 56 (L) 12/30/2019 0821   Lab Results  Component Value Date   CHOL 114  09/01/2021   HDL 37 (L) 09/01/2021   LDLCALC 60 09/01/2021   LDLDIRECT 88.9 08/13/2006   TRIG 86 09/01/2021   CHOLHDL 3.1 09/01/2021   Lab Results  Component Value Date   HGBA1C 6.5 (H) 09/01/2021   No results found for: "VITAMINB12" Lab Results  Component Value Date   TSH 0.768 09/01/2021      ASSESSMENT AND PLAN 76 y.o. year old female  has a past medical history of Glaucoma, Hyperlipidemia, and Hypertension. here with:  Stroke: possible new acute left thalamus this admission likely also secondary to small vessel disease   Continue clopidogrel 75 mg daily for secondary stroke prevention.  Discussed secondary stroke prevention measures and importance of close PCP follow up for aggressive stroke risk factor management. I have gone over the pathophysiology of stroke, warning signs and symptoms, risk factors and their management in some detail with instructions to go to the closest emergency room for symptoms of concern. STOP ASPRIN AND BRILLINTA  HTN: BP goal <130/90.   HLD: LDL goal <70. Recent LDL 60.  DMII: A1c goal<7.0. Recent A1c 6.5.  Encouraged patient to monitor diet and encouraged exercise FU with our office 6 months     Butch Penny, MSN, NP-C 10/17/2021, 5:07 PM [facility] [address], [address] [place], [place] [address] [phone]

## 2021-10-18 ENCOUNTER — Encounter: Payer: Self-pay | Admitting: Adult Health

## 2021-10-18 ENCOUNTER — Ambulatory Visit: Payer: Medicare HMO | Admitting: Adult Health

## 2021-10-18 VITALS — BP 104/65 | HR 104 | Ht 65.0 in | Wt 188.8 lb

## 2021-10-18 DIAGNOSIS — I639 Cerebral infarction, unspecified: Secondary | ICD-10-CM | POA: Diagnosis not present

## 2021-10-18 DIAGNOSIS — I10 Essential (primary) hypertension: Secondary | ICD-10-CM | POA: Diagnosis not present

## 2021-10-18 DIAGNOSIS — E785 Hyperlipidemia, unspecified: Secondary | ICD-10-CM

## 2021-10-18 MED ORDER — CLOPIDOGREL BISULFATE 75 MG PO TABS: 75.0000 mg | ORAL_TABLET | Freq: Every day | ORAL | 11 refills | Status: DC

## 2021-10-18 NOTE — Patient Instructions (Signed)
   Continue clopidogrel 75 mg daily for secondary stroke prevention. STOP ASPRIN AND BRILLINTA  HTN: BP goal <130/90.   HLD: LDL goal <70. Recent LDL 60.  DMII: A1c goal<7.0. Recent A1c 6.5.  Encouraged patient to monitor diet and encouraged exercise FU with our office PRN

## 2021-10-28 ENCOUNTER — Encounter (HOSPITAL_BASED_OUTPATIENT_CLINIC_OR_DEPARTMENT_OTHER): Payer: Self-pay | Admitting: Cardiovascular Disease

## 2021-10-28 ENCOUNTER — Ambulatory Visit: Payer: Medicare HMO

## 2021-10-28 DIAGNOSIS — Z800 Family history of malignant neoplasm of digestive organs: Secondary | ICD-10-CM | POA: Insufficient documentation

## 2021-10-28 NOTE — Progress Notes (Deleted)
Patient ID: Heather Gross                 DOB: 05-30-1945                      MRN: [id number]     HPI: Heather Gross is a 76 y.o. female referred by Dr. Oval Linsey to HTN clinic. PMH is significant for  Current HTN meds:  Previously tried:  BP goal:   Family History:   Social History:   Diet:   Exercise:   Home BP readings:   Wt Readings from Last 3 Encounters:  10/18/21 188 lb 12.8 oz (85.6 kg)  09/28/21 192 lb 1.6 oz (87.1 kg)  09/14/21 207 lb 3.7 oz (94 kg)   BP Readings from Last 3 Encounters:  10/18/21 104/65  09/28/21 122/84  09/14/21 (!) 138/45   Pulse Readings from Last 3 Encounters:  10/18/21 (!) 104  09/28/21 86  09/14/21 84    Renal function: CrCl cannot be calculated (Patient's most recent lab result is older than the maximum 21 days allowed.).  Past Medical History:  Diagnosis Date   Glaucoma    Hyperlipidemia    Hypertension     Current Outpatient Medications on File Prior to Visit  Medication Sig Dispense Refill   Calcium 500 MG CHEW Chew 2 tablets (1,000 mg total) by mouth daily. 30 tablet 0   clopidogrel (PLAVIX) 75 MG tablet Take 1 tablet (75 mg total) by mouth daily. 30 tablet 11   [facility] D3 2000 units CAPS TAKE 1 CAPSULE BY MOUTH EVERY DAY (Patient taking differently: Take 2,000 Units by mouth daily after breakfast.) 100 capsule 2   ezetimibe (ZETIA) 10 MG tablet Take 10 mg by mouth daily.     fluticasone (FLONASE) 50 MCG/ACT nasal spray Place 1-2 sprays into both nostrils daily as needed for allergies or rhinitis.     metFORMIN (GLUCOPHAGE) 500 MG tablet TAKE 1 TABLET BY MOUTH AT BEDTIME. (Patient taking differently: Take 500 mg by mouth daily with breakfast.) 30 tablet 0   Multiple Vitamin (MULTIVITAMIN) tablet Take 1 tablet by mouth daily with breakfast.     Olmesartan-amLODIPine-HCTZ 40-5-25 MG TABS Take 1 tablet by mouth daily. 90 tablet 3   pantoprazole (PROTONIX) 20 MG tablet Take 1 tablet (20 mg total) by mouth daily. 30 tablet 3    rosuvastatin (CRESTOR) 40 MG tablet Take 40 mg by mouth daily.     No current facility-administered medications on file prior to visit.    Allergies  Allergen Reactions   Lipitor [Atorvastatin] Other (See Comments)    Made mouth very dry and the lips discolored   Shellfish-Derived Products Other (See Comments)    Hands break out   Latex Rash     Assessment/Plan:  1. Hypertension -

## 2021-10-28 NOTE — Assessment & Plan Note (Signed)
Continue atorvastatin and Zetia. 

## 2021-10-28 NOTE — Assessment & Plan Note (Signed)
Blood pressure initially at goal in that high on repeat.  She is somewhat unclear what she is taking but wants to try and simplify her regimen.  We will switch her to olmesartan/amlodipine/HCTZ 40/5/20 5 mg.  We will stop lisinopril, hydrochlorothiazide, and doxazosin.  We will ask her to track her blood pressures, write them down, and bring her log back to follow-up in 1 month.

## 2021-10-28 NOTE — Assessment & Plan Note (Signed)
Symptoms continue to improve.  BP control as above.  Continue atorvastatin and Zetia.

## 2021-11-06 ENCOUNTER — Encounter (HOSPITAL_COMMUNITY): Payer: Self-pay

## 2021-11-06 ENCOUNTER — Emergency Department (HOSPITAL_COMMUNITY)
Admission: EM | Admit: 2021-11-06 | Discharge: 2021-11-06 | Discharge status: Against Medical Advice | Payer: Medicare HMO | Attending: Emergency Medicine | Admitting: Emergency Medicine

## 2021-11-06 ENCOUNTER — Other Ambulatory Visit: Payer: Self-pay

## 2021-11-06 ENCOUNTER — Emergency Department (HOSPITAL_COMMUNITY): Payer: Medicare HMO

## 2021-11-06 DIAGNOSIS — Z5321 Procedure and treatment not carried out due to patient leaving prior to being seen by health care provider: Secondary | ICD-10-CM | POA: Diagnosis not present

## 2021-11-06 DIAGNOSIS — H532 Diplopia: Secondary | ICD-10-CM | POA: Insufficient documentation

## 2021-11-06 DIAGNOSIS — Y900 Blood alcohol level of less than 20 mg/100 ml: Secondary | ICD-10-CM | POA: Diagnosis not present

## 2021-11-06 HISTORY — DX: Cerebral infarction, unspecified: I63.9

## 2021-11-06 LAB — CBC
HCT: 35.7 % — ABNORMAL LOW (ref 36.0–46.0)
Hemoglobin: 11.9 g/dL — ABNORMAL LOW (ref 12.0–15.0)
MCH: 31 pg (ref 26.0–34.0)
MCHC: 33.3 g/dL (ref 30.0–36.0)
MCV: 93 fL (ref 80.0–100.0)
Platelets: 194 10*3/uL (ref 150–400)
RBC: 3.84 MIL/uL — ABNORMAL LOW (ref 3.87–5.11)
RDW: 12.8 % (ref 11.5–15.5)
WBC: 8.1 10*3/uL (ref 4.0–10.5)
nRBC: 0 % (ref 0.0–0.2)

## 2021-11-06 LAB — COMPREHENSIVE METABOLIC PANEL
ALT: 27 U/L (ref 0–44)
AST: 26 U/L (ref 15–41)
Albumin: 3.9 g/dL (ref 3.5–5.0)
Alkaline Phosphatase: 43 U/L (ref 38–126)
Anion gap: 14 (ref 5–15)
BUN: 22 mg/dL (ref 8–23)
CO2: 23 mmol/L (ref 22–32)
Calcium: 10.2 mg/dL (ref 8.9–10.3)
Chloride: 104 mmol/L (ref 98–111)
Creatinine, Ser: 1.78 mg/dL — ABNORMAL HIGH (ref 0.44–1.00)
GFR, Estimated: 29 mL/min — ABNORMAL LOW (ref >60)
Glucose, Bld: 118 mg/dL — ABNORMAL HIGH (ref 70–99)
Potassium: 4 mmol/L (ref 3.5–5.1)
Sodium: 141 mmol/L (ref 135–145)
Total Bilirubin: 0.5 mg/dL (ref 0.3–1.2)
Total Protein: 7.3 g/dL (ref 6.5–8.1)

## 2021-11-06 LAB — DIFFERENTIAL
Abs Immature Granulocytes: 0.02 10*3/uL (ref 0.00–0.07)
Basophils Absolute: 0.1 10*3/uL (ref 0.0–0.1)
Basophils Relative: 1 %
Eosinophils Absolute: 0.2 10*3/uL (ref 0.0–0.5)
Eosinophils Relative: 2 %
Immature Granulocytes: 0 %
Lymphocytes Relative: 30 %
Lymphs Abs: 2.5 10*3/uL (ref 0.7–4.0)
Monocytes Absolute: 0.5 10*3/uL (ref 0.1–1.0)
Monocytes Relative: 7 %
Neutro Abs: 4.9 10*3/uL (ref 1.7–7.7)
Neutrophils Relative %: 60 %

## 2021-11-06 LAB — PROTIME-INR
INR: 1 (ref 0.8–1.2)
Prothrombin Time: 12.7 seconds (ref 11.4–15.2)

## 2021-11-06 LAB — ETHANOL: Alcohol, Ethyl (B): <10 mg/dL (ref <10)

## 2021-11-06 LAB — APTT: aPTT: 30 seconds (ref 24–36)

## 2021-11-06 NOTE — ED Triage Notes (Signed)
Patient had recent stroke and yesterday am awoke with double vision. Patient sent from ophthalmologist today and directed to ED. Denies pain, no headache. Some right arm coldness from the stroke. States she sees 2 of everything

## 2021-11-06 NOTE — ED Provider Triage Note (Signed)
Emergency Medicine Provider Triage Evaluation Note  Heather Gross , a 76 y.o. female  was evaluated in triage.  Pt complains of presents emergency department with diplopia.  Patient had a recent stroke.  She woke up this morning with double vision.  Seen by her ophthalmologist and advised to come here for further evaluation..  Review of Systems  Positive: Diplopia Negative: Fever  Physical Exam  BP 117/65 (BP Location: Right Arm)   Pulse 96   Temp 97.7 F (36.5 C) (Oral)   Resp 20   SpO2 99%  Gen:   Awake, no distress   Resp:  Normal effort  MSK:   Moves extremities without difficulty  Other:  3rd cranial nerve palsy, Case discussed with Dr. Lorrin Goodell who recommends MRI  Medical Decision Making  Medically screening exam initiated at 5:57 PM.  Appropriate orders placed.  Heather Gross was informed that the remainder of the evaluation will be completed by another provider, this initial triage assessment does not replace that evaluation, and the importance of remaining in the ED until their evaluation is complete.  Work-up initiated   Heather Mail, PA-C 11/06/21 1757

## 2021-11-06 NOTE — ED Notes (Signed)
Patient states the wait is too long and she is leaving 

## 2021-11-07 ENCOUNTER — Observation Stay (HOSPITAL_COMMUNITY)
Admission: EM | Admit: 2021-11-07 | Discharge: 2021-11-09 | Disposition: A | Discharge status: Home / Self Care | Payer: Medicare HMO | Attending: Internal Medicine | Admitting: Internal Medicine

## 2021-11-07 ENCOUNTER — Other Ambulatory Visit: Payer: Self-pay

## 2021-11-07 ENCOUNTER — Encounter (HOSPITAL_COMMUNITY): Payer: Self-pay

## 2021-11-07 ENCOUNTER — Emergency Department (HOSPITAL_COMMUNITY): Payer: Medicare HMO

## 2021-11-07 DIAGNOSIS — E785 Hyperlipidemia, unspecified: Secondary | ICD-10-CM | POA: Diagnosis not present

## 2021-11-07 DIAGNOSIS — Z7984 Long term (current) use of oral hypoglycemic drugs: Secondary | ICD-10-CM | POA: Diagnosis not present

## 2021-11-07 DIAGNOSIS — I129 Hypertensive chronic kidney disease with stage 1 through stage 4 chronic kidney disease, or unspecified chronic kidney disease: Secondary | ICD-10-CM | POA: Diagnosis not present

## 2021-11-07 DIAGNOSIS — E1122 Type 2 diabetes mellitus with diabetic chronic kidney disease: Secondary | ICD-10-CM | POA: Diagnosis not present

## 2021-11-07 DIAGNOSIS — R2689 Other abnormalities of gait and mobility: Secondary | ICD-10-CM | POA: Diagnosis not present

## 2021-11-07 DIAGNOSIS — Z91040 Latex allergy status: Secondary | ICD-10-CM | POA: Insufficient documentation

## 2021-11-07 DIAGNOSIS — Z7902 Long term (current) use of antithrombotics/antiplatelets: Secondary | ICD-10-CM | POA: Diagnosis not present

## 2021-11-07 DIAGNOSIS — Z87891 Personal history of nicotine dependence: Secondary | ICD-10-CM | POA: Diagnosis not present

## 2021-11-07 DIAGNOSIS — I679 Cerebrovascular disease, unspecified: Secondary | ICD-10-CM

## 2021-11-07 DIAGNOSIS — I639 Cerebral infarction, unspecified: Principal | ICD-10-CM

## 2021-11-07 DIAGNOSIS — I10 Essential (primary) hypertension: Secondary | ICD-10-CM | POA: Diagnosis not present

## 2021-11-07 DIAGNOSIS — Z79899 Other long term (current) drug therapy: Secondary | ICD-10-CM | POA: Diagnosis not present

## 2021-11-07 DIAGNOSIS — N1831 Chronic kidney disease, stage 3a: Secondary | ICD-10-CM | POA: Diagnosis not present

## 2021-11-07 DIAGNOSIS — H532 Diplopia: Secondary | ICD-10-CM

## 2021-11-07 DIAGNOSIS — I471 Supraventricular tachycardia: Secondary | ICD-10-CM

## 2021-11-07 LAB — I-STAT CHEM 8, ED
BUN: 24 mg/dL — ABNORMAL HIGH (ref 8–23)
Calcium, Ion: 1.19 mmol/L (ref 1.15–1.40)
Chloride: 103 mmol/L (ref 98–111)
Creatinine, Ser: 1.8 mg/dL — ABNORMAL HIGH (ref 0.44–1.00)
Glucose, Bld: 155 mg/dL — ABNORMAL HIGH (ref 70–99)
HCT: 35 % — ABNORMAL LOW (ref 36.0–46.0)
Hemoglobin: 11.9 g/dL — ABNORMAL LOW (ref 12.0–15.0)
Potassium: 4.4 mmol/L (ref 3.5–5.1)
Sodium: 138 mmol/L (ref 135–145)
TCO2: 21 mmol/L — ABNORMAL LOW (ref 22–32)

## 2021-11-07 LAB — CBG MONITORING, ED: Glucose-Capillary: 109 mg/dL — ABNORMAL HIGH (ref 70–99)

## 2021-11-07 MED ORDER — ASPIRIN 81 MG PO TBEC
81.0000 mg | DELAYED_RELEASE_TABLET | Freq: Every day | ORAL | Status: DC
  Administered 2021-11-07 – 2021-11-09 (×3): 81 mg via ORAL
  Filled 2021-11-07 (×3): qty 1

## 2021-11-07 MED ORDER — STROKE: EARLY STAGES OF RECOVERY BOOK
Freq: Once | Status: AC
Start: 2021-11-08 — End: 2021-11-08
  Filled 2021-11-07: qty 1

## 2021-11-07 MED ORDER — ACETAMINOPHEN 650 MG RE SUPP: 650.0000 mg | RECTAL | Status: DC | PRN

## 2021-11-07 MED ORDER — ALUM & MAG HYDROXIDE-SIMETH 200-200-20 MG/5ML PO SUSP
30.0000 mL | Freq: Once | ORAL | Status: AC
  Administered 2021-11-07: 30 mL via ORAL
  Filled 2021-11-07: qty 30

## 2021-11-07 MED ORDER — ENOXAPARIN SODIUM 40 MG/0.4ML IJ SOSY
40.0000 mg | PREFILLED_SYRINGE | INTRAMUSCULAR | Status: DC
  Administered 2021-11-07 – 2021-11-08 (×2): 40 mg via SUBCUTANEOUS
  Filled 2021-11-07 (×2): qty 0.4

## 2021-11-07 MED ORDER — CLOPIDOGREL BISULFATE 75 MG PO TABS
75.0000 mg | ORAL_TABLET | Freq: Every day | ORAL | Status: DC
  Administered 2021-11-07: 75 mg via ORAL
  Filled 2021-11-07: qty 1

## 2021-11-07 MED ORDER — ROSUVASTATIN CALCIUM 20 MG PO TABS
40.0000 mg | ORAL_TABLET | Freq: Every day | ORAL | Status: DC
  Administered 2021-11-07 – 2021-11-09 (×3): 40 mg via ORAL
  Filled 2021-11-07 (×3): qty 2

## 2021-11-07 MED ORDER — INSULIN ASPART 100 UNIT/ML IJ SOLN
0.0000 [IU] | Freq: Three times a day (TID) | INTRAMUSCULAR | Status: DC
  Administered 2021-11-08 (×3): 1 [IU] via SUBCUTANEOUS

## 2021-11-07 MED ORDER — TICAGRELOR 90 MG PO TABS
90.0000 mg | ORAL_TABLET | Freq: Two times a day (BID) | ORAL | Status: DC
  Administered 2021-11-08 – 2021-11-09 (×4): 90 mg via ORAL
  Filled 2021-11-07 (×4): qty 1

## 2021-11-07 MED ORDER — ACETAMINOPHEN 160 MG/5ML PO SOLN: 650.0000 mg | ORAL | Status: DC | PRN

## 2021-11-07 MED ORDER — GADOBUTROL 1 MMOL/ML IV SOLN
9.5000 mL | Freq: Once | INTRAVENOUS | Status: AC | PRN
  Administered 2021-11-07: 9.5 mL via INTRAVENOUS

## 2021-11-07 MED ORDER — ACETAMINOPHEN 325 MG PO TABS: 650.0000 mg | ORAL_TABLET | ORAL | Status: DC | PRN

## 2021-11-07 MED ORDER — FAMOTIDINE IN NACL 20-0.9 MG/50ML-% IV SOLN
20.0000 mg | Freq: Once | INTRAVENOUS | Status: AC
  Administered 2021-11-07: 20 mg via INTRAVENOUS
  Filled 2021-11-07: qty 50

## 2021-11-07 NOTE — Assessment & Plan Note (Addendum)
1. Continue statin 2. FLP in AM 3. But cholesterol numbers on statin x2 months ago looked fairly decent: HDL 37, LDL of just 60 (so at goal). 4. Not clear if anyone has studied if any benefit to further lowering of LDL beyond <70 since she continues to have recurrent strokes despite being at goal, defer this question to Stroke team.

## 2021-11-07 NOTE — Assessment & Plan Note (Addendum)
Recurrent multifocal acute and subacute ischemic strokes in setting of advanced CVD. 1. Stroke pathway 2. Neuro consult 3. Tele monitor 4. Check A1C, FLP 5. Holding off on re-ordering further radiology studies at this point pending neurology recommendations since pt just had these done in May. 6. Cont Plavix 75 7. Add ASA 81 daily, Neurology to decide if this should be indefinite or stopped after 21 days again. 8. PT/OT/SLP

## 2021-11-07 NOTE — Assessment & Plan Note (Signed)
Hold home meds and allow permissive HTN in setting of acute stroke

## 2021-11-07 NOTE — H&P (Addendum)
History and Physical    Patient: Heather Gross QHU:[id number] DOB: 09-01-1945 DOA: 11/07/2021 DOS: the patient was seen and examined on 11/07/2021 PCP: [name], NP  Patient coming from: Home  Chief Complaint: Diplopia   HPI: Heather Gross is a 76 y.o. female with medical history significant of HTN, HLD, prior strokes.  Pt admitted 2x in May this year for multifocal acute strokes.  Found to have severe multivessel CVD but nothing intervenable.  Carotid stenosis < 70% B.  Pt woke up on Sat AM with double vision.  Went to see eye doctor yesterday who told her to go to ED.  Came to ED yesterday, MRI ordered but wait was too long so pt left.  Returns to ED today with persistent diplopia, no other new symptoms, but requests MRI.   Review of Systems: As mentioned in the history of present illness. All other systems reviewed and are negative. Past Medical History:  Diagnosis Date   Glaucoma    Hyperlipidemia    Hypertension    Stroke [facility])    Past Surgical History:  Procedure Laterality Date   BREAST BIOPSY Right 09/29/2015   BREAST EXCISIONAL BIOPSY Right 11/04/2015   papilloma   BREAST LUMPECTOMY WITH RADIOACTIVE SEED LOCALIZATION Right 11/04/2015   Procedure: BREAST LUMPECTOMY WITH RADIOACTIVE SEED LOCALIZATION;  Surgeon: Autumn Messing III, MD;  Location: [facility];  Service: General;  Laterality: Right;   LIPOMA EXCISION     Social History:  reports that she quit smoking about 31 years ago. Her smoking use included cigarettes. She has never used smokeless tobacco. She reports that she does not drink alcohol and does not use drugs.  Allergies  Allergen Reactions   Lipitor [Atorvastatin] Other (See Comments)    Made mouth very dry and the lips discolored   Shellfish-Derived Products Other (See Comments)    Hands break out   Latex Rash    Family History  Problem Relation Age of Onset   Heart disease Father    Diabetes Sister    Breast cancer Sister     Diabetes Sister    Diabetes Sister    Diabetes Sister    Diabetes Sister    Diabetes Brother     Prior to Admission medications   Medication Sig Start Date End Date Taking? Authorizing Provider  Calcium 500 MG CHEW Chew 2 tablets (1,000 mg total) by mouth daily. 12/13/16   Orlena Sheldon, PA-C  clopidogrel (PLAVIX) 75 MG tablet Take 1 tablet (75 mg total) by mouth daily. 10/18/21   Ward Givens, NP  [employer] D3 2000 units CAPS TAKE 1 CAPSULE BY MOUTH EVERY DAY Patient taking differently: Take 2,000 Units by mouth daily after breakfast. 12/17/17   Orlena Sheldon, PA-C  ezetimibe (ZETIA) 10 MG tablet Take 10 mg by mouth daily. 09/14/21   [provider]  fluticasone (FLONASE) 50 MCG/ACT nasal spray Place 1-2 sprays into both nostrils daily as needed for allergies or rhinitis. 06/06/19   [provider]  metFORMIN (GLUCOPHAGE) 500 MG tablet TAKE 1 TABLET BY MOUTH AT BEDTIME. Patient taking differently: Take 500 mg by mouth daily with breakfast. 08/16/20   Alycia Rossetti, MD  Multiple Vitamin (MULTIVITAMIN) tablet Take 1 tablet by mouth daily with breakfast.    [provider]  Olmesartan-amLODIPine-HCTZ 40-5-25 MG TABS Take 1 tablet by mouth daily. 09/28/21   Skeet Latch, MD  pantoprazole (PROTONIX) 20 MG tablet Take 1 tablet (20 mg total) by mouth daily. 09/14/21 09/14/22  Danford, Suann Larry, MD  rosuvastatin (CRESTOR) 40 MG tablet Take 40 mg by mouth daily. 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 Posterior pharynx clear of any exudate or lesions.Normal dentition.  Neck: normal, supple, no masses, no thyromegaly Respiratory: clear to auscultation bilaterally, no  wheezing, no crackles. Normal respiratory effort. No accessory muscle use.  Cardiovascular: Regular rate and rhythm, no murmurs / rubs / gallops. No extremity edema. 2+ pedal pulses. No carotid bruits.  Abdomen: no tenderness, no masses palpated. No hepatosplenomegaly. Bowel sounds positive.  Musculoskeletal: no clubbing / cyanosis. No joint deformity upper and lower extremities. Good ROM, no contractures. Normal muscle tone.  Skin: no rashes, lesions, ulcers. 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 Alert and oriented x 3. Normal mood.   Data Reviewed:     MRI/MRA: IMPRESSION: Small acute and subacute infarcts. Most relevant to the reported diplopia, there is involvement of the right periaqueductal gray matter.   Moderate to marked chronic microvascular ischemic changes with chronic small vessel infarcts.   No proximal intracranial vessel occlusion. Atherosclerosis as seen on recent CTA.  Assessment and Plan: * Acute ischemic stroke (HCC) Recurrent multifocal acute and subacute ischemic strokes in setting of advanced CVD. Stroke pathway Neuro consult Tele monitor Check A1C, FLP Holding off on re-ordering further radiology studies at this point pending neurology recommendations since pt just had these done in May. Cont Plavix 75 Add ASA 81 daily, Neurology to decide if this should be indefinite or stopped after 21 days again. PT/OT/SLP  Cerebrovascular disease Cause of pts recurrent strokes appears to be severe multivessel CVD based on prior CTAs in May. Nothing that was felt to be interveneable surgically.  Carotids with < 70% stenosis per DC summary. Continue aggressive risk factor modification  Stage 3a chronic kidney disease (CKD) (HCC) -Baseline Scr 1.3-1.6 Creat this month of 1.8 is slightly above her baseline.  Type 2 diabetes mellitus with diabetic chronic kidney disease ([place]) Hold  metformin given Creat 1.8 today SSI sensitive AC  Hyperlipidemia Continue statin FLP in AM But cholesterol numbers on statin x2 months ago looked fairly decent: HDL 37, LDL of just 60 (so at goal). Not clear if anyone has studied if any benefit to further lowering of LDL beyond <70 since she continues to have recurrent strokes despite being at goal, defer this question to Stroke team.  Essential hypertension Hold home meds and allow permissive HTN in setting of acute stroke      Advance Care Planning:   Code Status: Full Code  Consults: Neuro  Family Communication: No family in room  Severity of Illness: The appropriate patient status for this patient is OBSERVATION. Observation status is judged to be reasonable and necessary in order to provide the required intensity of service to ensure the patient's safety. The patient's presenting symptoms, physical exam findings, and initial radiographic and laboratory data in the context of their medical condition is felt to place them at decreased risk for further clinical deterioration. Furthermore, it is anticipated that the patient will be medically stable for discharge from  the hospital within 2 midnights of admission.   Author: Etta Quill., DO 11/07/2021 10:01 PM  For on call review [email or web address].

## 2021-11-07 NOTE — Assessment & Plan Note (Addendum)
Cause of pts recurrent strokes appears to be severe multivessel CVD based on prior CTAs in May. Nothing that was felt to be interveneable surgically.  Carotids with < 70% stenosis per DC summary. 1. Continue aggressive risk factor modification

## 2021-11-07 NOTE — ED Triage Notes (Signed)
Patient here yesterday for same double vision with no pain and blurred vision after recent stroke. Abigail PA spoke with neurologist and MRI was ordered but patient decided to not wait, no change in complaint. Labs collected as well yesterday

## 2021-11-07 NOTE — ED Notes (Signed)
Pt ambulating to bathroom with assistance from family.

## 2021-11-07 NOTE — Assessment & Plan Note (Signed)
Creat this month of 1.8 is slightly above her baseline.

## 2021-11-07 NOTE — ED Triage Notes (Signed)
Pt BIB [facility] from home c/o double vision since Friday. Pt has had increased imbalance since Friday as well.

## 2021-11-07 NOTE — ED Provider Triage Note (Signed)
Emergency Medicine Provider Triage Evaluation Note  Carmita Boom , a 76 y.o. female  was evaluated in triage.  Patient was here yesterday but left because she was tired of waiting for her MRI.  Had a stroke recently, had double vision.  Was seen by ophthalmology and they told her to come here.  Yesterday provider spoke with Dr. Maretta Los with neurology who recommended MRI.  Review of Systems  Positive: Diplopia, photophobia Negative:   Physical Exam  BP 101/63 (BP Location: Left Arm)   Pulse 88   Temp 98.1 F (36.7 C)   Resp 14   SpO2 95%  Gen:   Awake, no distress   Resp:  Normal effort  MSK:   Moves extremities without difficulty  Other:    Medical Decision Making  Medically screening exam initiated at 10:39 AM.  Appropriate orders placed.  Amauria Younts was informed that the remainder of the evaluation will be completed by another provider, this initial triage assessment does not replace that evaluation, and the importance of remaining in the ED until their evaluation is complete.    MRI with and without ordered   Rhae Hammock, PA-C 11/07/21 1040

## 2021-11-07 NOTE — Consult Note (Signed)
Neurology Consultation Reason for Consult: Stroke Referring Physician: Rae Halsted  CC: Double vision  History is obtained from: Patient  HPI: Heather Gross is a 76 y.o. female with a history of multiple previous strokes who was just hospitalized in May with strokes felt to be due to small vessel disease.  She was on aspirin and Brilinta for 1 month and then changed to Plavix.  She was doing well until the past couple of days when she began having double vision.  She denies any new weakness, numbness, nausea or vomiting.  She denies any joint pains, fevers, chills, or any other symptoms.   LKW: Friday tpa given?: no, outside of window   Past Medical History:  Diagnosis Date   Glaucoma    Hyperlipidemia    Hypertension    Stroke [facility])      Family History  Problem Relation Age of Onset   Heart disease Father    Diabetes Sister    Breast cancer Sister    Diabetes Sister    Diabetes Sister    Diabetes Sister    Diabetes Sister    Diabetes Brother      Social History:  reports that she quit smoking about 31 years ago. Her smoking use included cigarettes. She has never used smokeless tobacco. She reports that she does not drink alcohol and does not use drugs.   Exam: Current vital signs: BP 120/61   Pulse 94   Temp 98.1 F (36.7 C)   Resp (!) 24   SpO2 99%  Vital signs in last 24 hours: Temp:  [98.1 F (36.7 C)] 98.1 F (36.7 C) (07/10 1027) Pulse Rate:  [75-94] 94 (07/10 2300) Resp:  [14-24] 24 (07/10 2300) BP: (101-135)/(47-74) 120/61 (07/10 2300) SpO2:  [95 %-100 %] 99 % (07/10 2300)   Physical Exam  Constitutional: Appears well-developed and well-nourished.  Psych: Affect appropriate to situation Eyes: No scleral injection HENT: No OP obstruction MSK: no joint deformities.  Cardiovascular: Normal rate and regular rhythm.  Respiratory: Effort normal, non-labored breathing GI: Soft.  No distension. There is no tenderness.  Skin: WDI  Neuro: Mental  Status: Patient is awake, alert, oriented to person, place, month, year, and situation. Patient is able to give a clear and coherent history. No signs of aphasia or neglect Cranial Nerves: II: Visual Fields are full. Pupils are equal, round, and reactive to light.   III,IV, VI: EOMI without ptosis or diploplia.  V: Facial sensation is symmetric to temperature VII: Facial movement is symmetric.  VIII: hearing is intact to voice X: Uvula elevates symmetrically XI: Shoulder shrug is symmetric. XII: tongue is midline without atrophy or fasciculations.  Motor: She has mild right-sided weakness 4+/5 in the arm and leg  sensory: Sensation is symmetric to light touch and temperature in the arms and legs. Cerebellar: Consistent with weakness in the right    I have reviewed labs in epic and the results pertinent to this consultation are: Creatinine 1.8  I have reviewed the images obtained: MRI brain-multiple small strokes with, most with small vessel appearance, though with the number I think that emboli has to be considered.  Impression: 76 year old female with multifocal strokes.  They are predominately subcortical, but given the number and distribution, I think that emboli likely needs to be considered.    Recommendations: - HgbA1c, fasting lipid panel - MRI of the brain without contrast - Frequent neuro checks - Echocardiogram - CTA head and neck - Prophylactic therapy-Antiplatelet med: Aspirin - dose  $'81mg'w$  and brilinta '90mg'$  BID - Risk factor modification - Telemetry monitoring - PT consult, OT consult, Speech consult - Stroke team to follow   Roland Rack, MD [facility] [phone]  If 7pm- 7am, please page neurology on call as listed in [place].

## 2021-11-07 NOTE — Assessment & Plan Note (Signed)
Hold metformin given Creat 1.8 today SSI sensitive AC

## 2021-11-07 NOTE — ED Provider Notes (Signed)
[place] EMERGENCY DEPARTMENT Provider Note   CSN: [id number] Arrival date & time: 11/07/21  1004     History No chief complaint on file.   Heather Gross is a 76 y.o. female with a past medical history of type 2 diabetes, hyperlipidemia, CVA and glaucoma presenting today with double vision.  Patient had a stroke in the middle of May and woke up Saturday morning with double vision.  Yesterday she went to see an eye doctor at [facility] who told her to come to the emergency department.  She had no other symptoms.  Dr. Lorrin Goodell was consulted yesterday and recommended MRI.  Patient was waiting in the waiting room for many hours and decided to leave prior to MRI.  Today she returns, no new symptoms just requesting her MRI.  HPI     Home Medications Prior to Admission medications   Medication Sig Start Date End Date Taking? Authorizing Provider  Calcium 500 MG CHEW Chew 2 tablets (1,000 mg total) by mouth daily. 12/13/16   Orlena Sheldon, PA-C  clopidogrel (PLAVIX) 75 MG tablet Take 1 tablet (75 mg total) by mouth daily. 10/18/21   Ward Givens, NP  [employer] D3 2000 units CAPS TAKE 1 CAPSULE BY MOUTH EVERY DAY Patient taking differently: Take 2,000 Units by mouth daily after breakfast. 12/17/17   Orlena Sheldon, PA-C  ezetimibe (ZETIA) 10 MG tablet Take 10 mg by mouth daily. 09/14/21   [provider]  fluticasone (FLONASE) 50 MCG/ACT nasal spray Place 1-2 sprays into both nostrils daily as needed for allergies or rhinitis. 06/06/19   [provider]  metFORMIN (GLUCOPHAGE) 500 MG tablet TAKE 1 TABLET BY MOUTH AT BEDTIME. Patient taking differently: Take 500 mg by mouth daily with breakfast. 08/16/20   Alycia Rossetti, MD  Multiple Vitamin (MULTIVITAMIN) tablet Take 1 tablet by mouth daily with breakfast.    [provider]  Olmesartan-amLODIPine-HCTZ 40-5-25 MG TABS Take 1 tablet by mouth daily. 09/28/21   Skeet Latch, MD  pantoprazole  (PROTONIX) 20 MG tablet Take 1 tablet (20 mg total) by mouth daily. 09/14/21 09/14/22  Danford, Suann Larry, MD  rosuvastatin (CRESTOR) 40 MG tablet Take 40 mg by mouth daily. 09/08/21   [provider]      Allergies    Lipitor [atorvastatin], Shellfish-derived products, and Latex    Review of Systems   Review of Systems  Physical Exam Updated Vital Signs BP (!) 112/59   Pulse 84   Temp 98.1 F (36.7 C)   Resp 16   SpO2 100%  Physical Exam Vitals and nursing note reviewed.  Constitutional:      Appearance: Normal appearance.  HENT:     Head: Normocephalic and atraumatic.  Eyes:     General: No scleral icterus.    Conjunctiva/sclera: Conjunctivae normal.     Comments: No visual field cut in bilateral eyes when tested separately and together.  PERRLA bilaterally.  No medial deviation of the patient's right eye on EOMs.  Patient's left eye appears to be "down and out consistent with a 3rd nerve palsy  Pulmonary:     Effort: Pulmonary effort is normal. No respiratory distress.  Skin:    Findings: No rash.  Neurological:     Mental Status: She is alert.  Psychiatric:        Mood and Affect: Mood normal.     ED Results / Procedures / Treatments   Labs (all labs ordered are listed, but only abnormal results  are displayed) Labs Reviewed  I-STAT CHEM 8, ED - Abnormal; Notable for the following components:      Result Value   BUN 24 (*)    Creatinine, Ser 1.80 (*)    Glucose, Bld 155 (*)    TCO2 21 (*)    Hemoglobin 11.9 (*)    HCT 35.0 (*)    All other components within normal limits  CBG MONITORING, ED - Abnormal; Notable for the following components:   Glucose-Capillary 109 (*)    All other components within normal limits    EKG None  Radiology No results found.  Procedures Procedures   Medications Ordered in ED Medications  alum & mag hydroxide-simeth (MAALOX/MYLANTA) 200-200-20 MG/5ML suspension 30 mL (has no administration in time range)   famotidine (PEPCID) IVPB 20 mg premix (has no administration in time range)    ED Course/ Medical Decision Making/ A&P Clinical Course as of 11/07/21 1904  Mon Nov 07, 2021  1414 I-stat chem 8, ED (not at [facility] or [facility])(!) Called MRI to assure that patient was on the list.  They state that they are behind but she is certainly on their list. [MR]  1634 Patient reevaluated.  She says the diplopia has been proving since she has been here. [MR]  7628 Reevaluated denies any needs at this time [MR]  1853 Back from MRI, requesting something for indigestion.  GI cocktail and Pepcid ordered while we await MRI results [MR]    Clinical Course User Index [MR] Kassidy Dockendorf, Cecilio Asper, PA-C                           Medical Decision Making Amount and/or Complexity of Data Reviewed Labs:  Decision-making details documented in ED Course. Radiology: ordered.  Risk OTC drugs. Prescription drug management.   This patient presents to the ED for concern of diplopia.  Differential includes but is not limited to CVA, myasthenia gravis, GBS, brain mass cataracts, glaucoma, macular degeneration, MS,    This is not an exhaustive differential.    Past Medical History / Co-morbidities / Social History: Diabetes, hypertension, hyperlipidemia, recent CVA, glaucoma   Additional history: Per chart review, patient was in the hospital from 5/16 to 5/17 after an ischemic stroke.  She was placed on lifelong Plavix.  Yesterday she presented to the ED at the request of a [facility] ophthalmologist.  Left prior to MRI imaging.   No masses noted on scans in May.   Physical Exam: Pertinent physical exam findings include left-sided 3rd nerve palsy.  Inability to medially deviate right eye.  No visual field disturbance.  Lab Tests: I-STAT CHEM ordered for kidney function for MRI.  The pertinent results include: Creatinine 1.8, similar to yesterday's 1.78, a little bit higher than her baseline of 1.5.  No other labs  repeated today.   Imaging Studies: I ordered and viewed patient's MRI.  Pending radiology results.     Medications: GI cocktail and Pepcid ordered for upset stomach.  Disposition: 76 year old female presented with diplopia.  Has a history of CVA x2, most recent in May.  This problem has been going on for 2 days.  On physical exam it seems more consistent with a binocular diplopia.  MRI ordered to assess for potential mass.  Additionally, due to the nerve palsy MRA was ordered to rule out aneurysm.          I discussed this case with my attending physician Dr. Doren Custard who cosigned  this note including patient's presenting symptoms, physical exam, and planned diagnostics and interventions. Attending physician stated agreement with plan or made changes to plan which were implemented.       Darliss Ridgel 11/07/21 1907    Carmin Muskrat, MD 11/07/21 2042

## 2021-11-08 ENCOUNTER — Observation Stay (HOSPITAL_COMMUNITY): Payer: Medicare HMO

## 2021-11-08 ENCOUNTER — Observation Stay (HOSPITAL_BASED_OUTPATIENT_CLINIC_OR_DEPARTMENT_OTHER): Payer: Medicare HMO

## 2021-11-08 DIAGNOSIS — H532 Diplopia: Secondary | ICD-10-CM | POA: Diagnosis not present

## 2021-11-08 DIAGNOSIS — I639 Cerebral infarction, unspecified: Secondary | ICD-10-CM

## 2021-11-08 LAB — GLUCOSE, CAPILLARY
Glucose-Capillary: 137 mg/dL — ABNORMAL HIGH (ref 70–99)
Glucose-Capillary: 114 mg/dL — ABNORMAL HIGH (ref 70–99)

## 2021-11-08 LAB — LIPID PANEL
Cholesterol: 91 mg/dL (ref 0–200)
HDL: 30 mg/dL — ABNORMAL LOW (ref >40)
LDL Cholesterol: 28 mg/dL (ref 0–99)
Total CHOL/HDL Ratio: 3 RATIO
Triglycerides: 166 mg/dL — ABNORMAL HIGH (ref <150)
VLDL: 33 mg/dL (ref 0–40)

## 2021-11-08 LAB — CBG MONITORING, ED
Glucose-Capillary: 131 mg/dL — ABNORMAL HIGH (ref 70–99)
Glucose-Capillary: 147 mg/dL — ABNORMAL HIGH (ref 70–99)

## 2021-11-08 LAB — HEMOGLOBIN A1C
Hgb A1c MFr Bld: 7 % — ABNORMAL HIGH (ref 4.8–5.6)
Mean Plasma Glucose: 154.2 mg/dL

## 2021-11-08 LAB — RAPID URINE DRUG SCREEN, HOSP PERFORMED
Amphetamines: NOT DETECTED
Barbiturates: NOT DETECTED
Benzodiazepines: NOT DETECTED
Cocaine: NOT DETECTED
Opiates: NOT DETECTED
Tetrahydrocannabinol: NOT DETECTED

## 2021-11-08 NOTE — Plan of Care (Signed)
  Problem: Education: Goal: Knowledge of disease or condition will improve Outcome: Progressing Goal: Knowledge of secondary prevention will improve (SELECT ALL) Outcome: Progressing Goal: Knowledge of patient specific risk factors will improve (INDIVIDUALIZE FOR PATIENT) Outcome: Progressing Goal: Individualized Educational Video(s) Outcome: Progressing   Problem: Coping: Goal: Will verbalize positive feelings about self Outcome: Progressing Goal: Will identify appropriate support needs Outcome: Progressing

## 2021-11-08 NOTE — Evaluation (Signed)
Occupational Therapy Evaluation Patient Details Name: Heather Gross MRN: [id number] DOB: 01/31/1946 Today's Date: 11/08/2021   History of Present Illness 76 yo female presenting to ED on 7/10 with diplopia. Recently came to ED on 7/8 for diplopia, MRI was ordered but wait was too long so pt left. 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 PMH including HTN, HLD, and prior strokes.   Clinical Impression   PTA, pt was living with her husband and was independent. Currently, pt performing at Supervision level for ADLs and functional mobility using RW. Pt with decreased medial movement of her R eye. Occluding the R (nasal portion) lens of her glasses; pt reporting reduced diplopia.  Provided education and handout on diplopia, compensatory techniques, and safety. Pt would benefit from further acute OT to facilitate safe dc. Recommend dc to home with follow up at OP for further OT to optimize safety, independence with ADLs, and return to PLOF.    Recommendations for follow up therapy are one component of a multi-disciplinary discharge planning process, led by the attending physician.  Recommendations may be updated based on patient status, additional functional criteria and insurance authorization.   Follow Up Recommendations  Outpatient OT    Assistance Recommended at Discharge PRN  Patient can return home with the following      Functional Status Assessment  Patient has had a recent decline in their functional status and demonstrates the ability to make significant improvements in function in a reasonable and predictable amount of time.  Equipment Recommendations  None recommended by OT    Recommendations for Other Services       Precautions / Restrictions Precautions Precautions: Fall      Mobility Bed Mobility Overal bed mobility: Modified Independent             General bed mobility comments: Came to long sitting on stretcher and  stood from end of stretcher    Transfers Overall transfer level: Needs assistance Equipment used: Rolling walker (2 wheels) Transfers: Sit to/from Stand Sit to Stand: Supervision           General transfer comment: Supervision for safety      Balance Overall balance assessment: Needs assistance Sitting-balance support: No upper extremity supported, Feet supported Sitting balance-Leahy Scale: Good     Standing balance support: No upper extremity supported, During functional activity Standing balance-Leahy Scale: Fair                             ADL either performed or assessed with clinical judgement   ADL Overall ADL's : Needs assistance/impaired                                       General ADL Comments: Pt performing toileting, grooming at sink, and functional mobiltiy in room at Supervision level.     Vision Baseline Vision/History: 1 Wears glasses Patient Visual Report: Diplopia Vision Assessment?: Yes Eye Alignment: Impaired (comment) Tracking/Visual Pursuits: Right eye does not track medially Convergence: Impaired - to be further tested in functional context Diplopia Assessment: Objects split side to side;Only with left gaze Additional Comments: Pt with reduced diplopia with R lens (nasal portion) occlusion.     Perception     Praxis      Pertinent Vitals/Pain Pain Assessment Pain Assessment: No/denies pain     Hand  Dominance Right   Extremity/Trunk Assessment Upper Extremity Assessment Upper Extremity Assessment: Generalized weakness   Lower Extremity Assessment Lower Extremity Assessment: Defer to PT evaluation   Cervical / Trunk Assessment Cervical / Trunk Assessment: Normal   Communication Communication Communication: No difficulties   Cognition Arousal/Alertness: Awake/alert Behavior During Therapy: WFL for tasks assessed/performed Overall Cognitive Status: Within Functional Limits for tasks assessed                                  General Comments: Very motivated     General Comments  VSS on RA. Pricilla Handler, arriving at end of session    Exercises Exercises: Other exercises Other Exercises Other Exercises: Providing cup stacking exercises Other Exercises: Providing handout for diplopia   Shoulder Instructions      Home Living Family/patient expects to be discharged to:: Private residence Living Arrangements: Children;Spouse/significant other Available Help at Discharge: Family;Available 24 hours/day Type of Home: House Home Access: Stairs to enter [employer] of Steps: 2 Entrance Stairs-Rails: None Home Layout: One level     Bathroom Shower/Tub: Teacher, early years/pre: Handicapped height     Home Equipment: Conservation officer, nature (2 wheels);Cane - single point          Prior Functioning/Environment Prior Level of Function : Independent/Modified Independent;Driving             Mobility Comments: Using straight cane          OT Problem List: Impaired vision/perception;Decreased knowledge of precautions;Decreased activity tolerance      OT Treatment/Interventions: Self-care/ADL training;Therapeutic exercise;Manual therapy;Therapeutic activities;Visual/perceptual remediation/compensation;Patient/family education    OT Goals(Current goals can be found in the care plan section) Acute Rehab OT Goals Patient Stated Goal: Vision to improve OT Goal Formulation: With patient Time For Goal Achievement: 11/22/21 Potential to Achieve Goals: Good  OT Frequency: Min 3X/week    Co-evaluation              AM-PAC OT "6 Clicks" Daily Activity     Outcome Measure Help from another person eating meals?: None Help from another person taking care of personal grooming?: A Little Help from another person toileting, which includes using toliet, bedpan, or urinal?: A Little Help from another person bathing (including washing, rinsing,  drying)?: A Little Help from another person to put on and taking off regular upper body clothing?: A Little Help from another person to put on and taking off regular lower body clothing?: A Little 6 Click Score: 19   End of Session Nurse Communication: Mobility status  Activity Tolerance: Patient tolerated treatment well Patient left: in bed;with call bell/phone within reach;with family/visitor present  OT Visit Diagnosis: Low vision, both eyes (H54.2);Muscle weakness (generalized) (M62.81)                Time: 9702-6378 OT Time Calculation (min): 19 min Charges:  OT General Charges $OT Visit: 1 Visit OT Evaluation $OT Eval Moderate Complexity: 1 Mod  Delcia Spitzley MSOT, OTR/L Acute Rehab Office: [place] 11/08/2021, 4:14 PM

## 2021-11-08 NOTE — ED Notes (Signed)
Pt told this nurse that she told doctor that she already took 2 of her daily meds, plavix and metformin.  Pt has been ambulating with cane and staff assistance to restroom and back. Pt does drift to left when she walks.

## 2021-11-08 NOTE — ED Notes (Signed)
Report complete to [facility]. Teletracking put in for patient transport upstairs

## 2021-11-08 NOTE — Progress Notes (Signed)
PROGRESS NOTE    Heather Gross  OAC:[id number] DOB: October 29, 1945 DOA: 11/07/2021 PCP: [name], NP    Brief Narrative:  76 year old with history of hypertension hyperlipidemia and recent stroke that were treated with aspirin and Brilinta and currently on Plavix came to the emergency room with more than 24 hours of double vision on left side.  Patient had come to emergency room 1 day ago but left because of long wait time.  Return to ER with persistent diplopia.  Found to have another multifocal stroke so admitted to the hospital.   Assessment & Plan:   Acute ischemic stroke, suspected embolic a stroke now with recurrent multifocal acute and subacute strokes: Clinical findings, persistent diplopia left eye. CT head findings, not done. MRI of the brain, small acute and subacute infarcts mostly involved right periaqueductal gray matter, mild to moderate chronic microvascular ischemic changes. MRA of the brain, no proximal intracranial vessel occlusion.  Atherosclerosis.  Recent CTA of the head and neck done on 5/23 with no significant disease. 2D echocardiogram, previously normal.  We will repeat because of ongoing thromboembolism. Antiplatelet therapy, patient on Plavix at home.  Restarted on aspirin and Brilinta. LDL 28.  On Crestor.  At goal. Hemoglobin A1c, 7.  Resume metformin on discharge. DVT prophylaxis, Lovenox. Therapy recommendations, pending. Inpatient studies recommended, cardiology consulted for implanted loop recorder.  Type 2 diabetes: Well-controlled.  A1c 7.  On metformin at home.  Currently on sliding scale insulin.  Essential hypertension: Allow permissive hypertension today.  Blood pressures are stable.  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  Monitor closely.   DVT prophylaxis: enoxaparin (LOVENOX) injection 40 mg Start: 11/07/21 2200   Code Status: Full code. Family Communication: None. Disposition Plan: Status is: Observation The patient will  require care spanning > 2 midnights and should be moved to inpatient because: Inpatient procedures planned, acute recurrent  stroke and investigations needed as inpatient.     Consultants:  Neurology Cardiology for loop recorder  Procedures:  None  Antimicrobials:  None   Subjective: Patient seen and examined.  She was only complaining of diplopia on her left eye.  She was n.p.o. overnight but when I went to examine her, she was taking her own medication from her medication bottles and drinking with soda with no difficulties.  Told patient to only take medicines given by the nursing staff.  Objective: Vitals:   11/08/21 0600 11/08/21 0700 11/08/21 0800 11/08/21 1135  BP: 109/74 101/76 104/71 110/70  Pulse: 83 88 84 88  Resp: '17 13 18 20  '$ Temp:      SpO2: 97% 95% 96% 98%    Intake/Output Summary (Last 24 hours) at 11/08/2021 1218 Last data filed at 11/07/2021 2109 Gross per 24 hour  Intake 50 ml  Output --  Net 50 ml   There were no vitals filed for this visit.  Examination:  General exam: Appears calm and comfortable  Respiratory system: No added sounds. Cardiovascular system: S1 & S2 heard, RRR. No pedal edema. Gastrointestinal system: Abdomen is nondistended, soft and nontender. No organomegaly or masses felt. Normal bowel sounds heard. Central nervous system: Alert and oriented.  Visual fields are equal. Thyroid nerve palsy left eye.  Extremities: All mostly normal.  Right mildly weaker than left side. Skin: No rashes, lesions or ulcers Psychiatry: Judgement and insight appear normal. Mood & affect appropriate.     Data Reviewed: I have personally reviewed following labs and imaging studies  CBC: Recent Labs  Lab 11/06/21  1801 11/07/21 1100  WBC 8.1  --   NEUTROABS 4.9  --   HGB 11.9* 11.9*  HCT 35.7* 35.0*  MCV 93.0  --   PLT 194  --    Basic Metabolic Panel: Recent Labs  Lab 11/06/21 1801 11/07/21 1100  NA 141 138  K 4.0 4.4  CL 104 103   CO2 23  --   GLUCOSE 118* 155*  BUN 22 24*  CREATININE 1.78* 1.80*  CALCIUM 10.2  --    GFR: CrCl cannot be calculated (Unknown ideal weight.). Liver Function Tests: Recent Labs  Lab 11/06/21 1801  AST 26  ALT 27  ALKPHOS 43  BILITOT 0.5  PROT 7.3  ALBUMIN 3.9   No results for input(s): "LIPASE", "AMYLASE" in the last 168 hours. No results for input(s): "AMMONIA" in the last 168 hours. Coagulation Profile: Recent Labs  Lab 11/06/21 1801  INR 1.0   Cardiac Enzymes: No results for input(s): "CKTOTAL", "CKMB", "CKMBINDEX", "TROPONINI" in the last 168 hours. BNP (last 3 results) No results for input(s): "PROBNP" in the last 8760 hours. HbA1C: Recent Labs    11/08/21 0836  HGBA1C 7.0*   CBG: Recent Labs  Lab 11/07/21 1515 11/08/21 0831 11/08/21 1142  GLUCAP 109* 131* 147*   Lipid Profile: Recent Labs    11/08/21 0517  CHOL 91  HDL 30*  LDLCALC 28  TRIG 166*  CHOLHDL 3.0   Thyroid Function Tests: No results for input(s): "TSH", "T4TOTAL", "FREET4", "T3FREE", "THYROIDAB" in the last 72 hours. Anemia Panel: No results for input(s): "VITAMINB12", "FOLATE", "FERRITIN", "TIBC", "IRON", "RETICCTPCT" in the last 72 hours. Sepsis Labs: No results for input(s): "PROCALCITON", "LATICACIDVEN" in the last 168 hours.  No results found for this or any previous visit (from the past 240 hour(s)).       Radiology Studies: MR Brain W and Wo Contrast  Result Date: 11/07/2021 CLINICAL DATA:  Neuro deficit, acute, stroke suspected; double vision EXAM: MRI HEAD WITHOUT AND WITH CONTRAST MRA HEAD WITHOUT CONTRAST TECHNIQUE: Multiplanar, multi-echo pulse sequences of the brain and surrounding structures were acquired without and with intravenous contrast. Angiographic images of the Circle of Willis were acquired using MRA technique without intravenous contrast. CONTRAST:  9.39m GADAVIST GADOBUTROL 1 MMOL/ML IV SOLN COMPARISON:  Sep 13, 2021 FINDINGS: MRI HEAD FINDINGS  Brain: A few small foci of variably reduced diffusion are identified including left dorsal pons right paramedian midbrain tegmentum, and bilateral frontal white matter. Foci on the prior study have resolved. There is a small focus of enhancement in the white matter at adjacent to the left frontal horn (series 22, image 28). Very faint corresponding diffusion hyperintensity. This probably represents a subacute infarct. Additional patchy and confluent areas of T2 hyperintensity in the supratentorial and pontine white matter are nonspecific but probably reflect stable moderate to marked chronic microvascular ischemic changes. Chronic small vessel infarcts present including pons, thalamus, and central white matter. Scattered foci of susceptibility are again identified likely reflecting chronic microhemorrhages with distribution suggesting hypertension as the underlying etiology. No mass or mass effect.  No hydrocephalus or extra-axial collection. Vascular: Major vessel flow voids at the skull base are preserved. Skull and upper cervical spine: Normal marrow signal is preserved. Sinuses/Orbits: Paranasal sinuses are aerated. Orbits are unremarkable. Other: Sella is unremarkable.  Mastoid air cells are clear. MRA HEAD FINDINGS Anterior circulation: Intracranial internal carotid arteries are patent with atherosclerotic irregularity. There is stenosis in the right paraclinoid and left paraclinoid and supraclinoid portions consistent with findings  on recent CTA. Anterior and middle cerebral arteries are patent with atherosclerotic irregularity bilateral distal ACA stenoses are identified as seen on prior CTA. Posterior circulation: Intracranial vertebral arteries are partially included but patent. Basilar artery is patent. Posterior cerebral arteries are patent with atherosclerotic irregularity. Right posterior communicating artery is present and is the primary supply of the right PCA. Other: No aneurysm. IMPRESSION: Small  acute and subacute infarcts. Most relevant to the reported diplopia, there is involvement of the right periaqueductal gray matter. Moderate to marked chronic microvascular ischemic changes with chronic small vessel infarcts. No proximal intracranial vessel occlusion. Atherosclerosis as seen on recent CTA. Electronically Signed   By: Macy Mis M.D.   On: 11/07/2021 19:40   MR ANGIO HEAD WO CONTRAST  Result Date: 11/07/2021 CLINICAL DATA:  Neuro deficit, acute, stroke suspected; double vision EXAM: MRI HEAD WITHOUT AND WITH CONTRAST MRA HEAD WITHOUT CONTRAST TECHNIQUE: Multiplanar, multi-echo pulse sequences of the brain and surrounding structures were acquired without and with intravenous contrast. Angiographic images of the Circle of Willis were acquired using MRA technique without intravenous contrast. CONTRAST:  9.40m GADAVIST GADOBUTROL 1 MMOL/ML IV SOLN COMPARISON:  Sep 13, 2021 FINDINGS: MRI HEAD FINDINGS Brain: A few small foci of variably reduced diffusion are identified including left dorsal pons right paramedian midbrain tegmentum, and bilateral frontal white matter. Foci on the prior study have resolved. There is a small focus of enhancement in the white matter at adjacent to the left frontal horn (series 22, image 28). Very faint corresponding diffusion hyperintensity. This probably represents a subacute infarct. Additional patchy and confluent areas of T2 hyperintensity in the supratentorial and pontine white matter are nonspecific but probably reflect stable moderate to marked chronic microvascular ischemic changes. Chronic small vessel infarcts present including pons, thalamus, and central white matter. Scattered foci of susceptibility are again identified likely reflecting chronic microhemorrhages with distribution suggesting hypertension as the underlying etiology. No mass or mass effect.  No hydrocephalus or extra-axial collection. Vascular: Major vessel flow voids at the skull base are  preserved. Skull and upper cervical spine: Normal marrow signal is preserved. Sinuses/Orbits: Paranasal sinuses are aerated. Orbits are unremarkable. Other: Sella is unremarkable.  Mastoid air cells are clear. MRA HEAD FINDINGS Anterior circulation: Intracranial internal carotid arteries are patent with atherosclerotic irregularity. There is stenosis in the right paraclinoid and left paraclinoid and supraclinoid portions consistent with findings on recent CTA. Anterior and middle cerebral arteries are patent with atherosclerotic irregularity bilateral distal ACA stenoses are identified as seen on prior CTA. Posterior circulation: Intracranial vertebral arteries are partially included but patent. Basilar artery is patent. Posterior cerebral arteries are patent with atherosclerotic irregularity. Right posterior communicating artery is present and is the primary supply of the right PCA. Other: No aneurysm. IMPRESSION: Small acute and subacute infarcts. Most relevant to the reported diplopia, there is involvement of the right periaqueductal gray matter. Moderate to marked chronic microvascular ischemic changes with chronic small vessel infarcts. No proximal intracranial vessel occlusion. Atherosclerosis as seen on recent CTA. Electronically Signed   By: PMacy MisM.D.   On: 11/07/2021 19:40        Scheduled Meds:  aspirin EC  81 mg Oral Daily   enoxaparin (LOVENOX) injection  40 mg Subcutaneous Q24H   insulin aspart  0-9 Units Subcutaneous TID WC   rosuvastatin  40 mg Oral Daily   ticagrelor  90 mg Oral BID   Continuous Infusions:   LOS: 0 days    Time spent: 35  minutes    Barb Merino, MD [facility] Pager [phone]

## 2021-11-08 NOTE — Progress Notes (Signed)
STROKE TEAM PROGRESS NOTE   SUBJECTIVE (INTERVAL HISTORY) No family is at the bedside.  Overall her condition is stable. She still has diplopia on left gaze, exam showed right INO. However, MRI showed multifocal infarcts, embolic pattern. Discussed about loop recorder, she is in agreement.    OBJECTIVE Pulse Rate:  [75-108] 84 (07/11 0800) Resp:  [11-24] 18 (07/11 0800) BP: (101-135)/(47-81) 104/71 (07/11 0800) SpO2:  [94 %-100 %] 96 % (07/11 0800)  Recent Labs  Lab 11/07/21 1515 11/08/21 0831  GLUCAP 109* 131*   Recent Labs  Lab 11/06/21 1801 11/07/21 1100  NA 141 138  K 4.0 4.4  CL 104 103  CO2 23  --   GLUCOSE 118* 155*  BUN 22 24*  CREATININE 1.78* 1.80*  CALCIUM 10.2  --    Recent Labs  Lab 11/06/21 1801  AST 26  ALT 27  ALKPHOS 43  BILITOT 0.5  PROT 7.3  ALBUMIN 3.9   Recent Labs  Lab 11/06/21 1801 11/07/21 1100  WBC 8.1  --   NEUTROABS 4.9  --   HGB 11.9* 11.9*  HCT 35.7* 35.0*  MCV 93.0  --   PLT 194  --    No results for input(s): "CKTOTAL", "CKMB", "CKMBINDEX", "TROPONINI" in the last 168 hours. Recent Labs    11/06/21 1801  LABPROT 12.7  INR 1.0   No results for input(s): "COLORURINE", "LABSPEC", "PHURINE", "GLUCOSEU", "HGBUR", "BILIRUBINUR", "KETONESUR", "PROTEINUR", "UROBILINOGEN", "NITRITE", "LEUKOCYTESUR" in the last 72 hours.  Invalid input(s): "APPERANCEUR"     Component Value Date/Time   CHOL 91 11/08/2021 0517   TRIG 166 (H) 11/08/2021 0517   HDL 30 (L) 11/08/2021 0517   CHOLHDL 3.0 11/08/2021 0517   VLDL 33 11/08/2021 0517   LDLCALC 28 11/08/2021 0517   LDLCALC 93 12/30/2019 0821   Lab Results  Component Value Date   HGBA1C 7.0 (H) 11/08/2021      Component Value Date/Time   LABOPIA NONE DETECTED 11/08/2021 1010   COCAINSCRNUR NONE DETECTED 11/08/2021 1010   LABBENZ NONE DETECTED 11/08/2021 1010   AMPHETMU NONE DETECTED 11/08/2021 1010   THCU NONE DETECTED 11/08/2021 1010   LABBARB NONE DETECTED 11/08/2021 1010     Recent Labs  Lab 11/06/21 1801  ETH <10    I have personally reviewed the radiological images below and agree with the radiology interpretations.  MR Brain W and Wo Contrast  Result Date: 11/07/2021 CLINICAL DATA:  Neuro deficit, acute, stroke suspected; double vision EXAM: MRI HEAD WITHOUT AND WITH CONTRAST MRA HEAD WITHOUT CONTRAST TECHNIQUE: Multiplanar, multi-echo pulse sequences of the brain and surrounding structures were acquired without and with intravenous contrast. Angiographic images of the Circle of Willis were acquired using MRA technique without intravenous contrast. CONTRAST:  9.73m GADAVIST GADOBUTROL 1 MMOL/ML IV SOLN COMPARISON:  Sep 13, 2021 FINDINGS: MRI HEAD FINDINGS Brain: A few small foci of variably reduced diffusion are identified including left dorsal pons right paramedian midbrain tegmentum, and bilateral frontal white matter. Foci on the prior study have resolved. There is a small focus of enhancement in the white matter at adjacent to the left frontal horn (series 22, image 28). Very faint corresponding diffusion hyperintensity. This probably represents a subacute infarct. Additional patchy and confluent areas of T2 hyperintensity in the supratentorial and pontine white matter are nonspecific but probably reflect stable moderate to marked chronic microvascular ischemic changes. Chronic small vessel infarcts present including pons, thalamus, and central white matter. Scattered foci of susceptibility are again identified likely reflecting  chronic microhemorrhages with distribution suggesting hypertension as the underlying etiology. No mass or mass effect.  No hydrocephalus or extra-axial collection. Vascular: Major vessel flow voids at the skull base are preserved. Skull and upper cervical spine: Normal marrow signal is preserved. Sinuses/Orbits: Paranasal sinuses are aerated. Orbits are unremarkable. Other: Sella is unremarkable.  Mastoid air cells are clear. MRA HEAD FINDINGS  Anterior circulation: Intracranial internal carotid arteries are patent with atherosclerotic irregularity. There is stenosis in the right paraclinoid and left paraclinoid and supraclinoid portions consistent with findings on recent CTA. Anterior and middle cerebral arteries are patent with atherosclerotic irregularity bilateral distal ACA stenoses are identified as seen on prior CTA. Posterior circulation: Intracranial vertebral arteries are partially included but patent. Basilar artery is patent. Posterior cerebral arteries are patent with atherosclerotic irregularity. Right posterior communicating artery is present and is the primary supply of the right PCA. Other: No aneurysm. IMPRESSION: Small acute and subacute infarcts. Most relevant to the reported diplopia, there is involvement of the right periaqueductal gray matter. Moderate to marked chronic microvascular ischemic changes with chronic small vessel infarcts. No proximal intracranial vessel occlusion. Atherosclerosis as seen on recent CTA. Electronically Signed   By: Macy Mis M.D.   On: 11/07/2021 19:40   MR ANGIO HEAD WO CONTRAST  Result Date: 11/07/2021 CLINICAL DATA:  Neuro deficit, acute, stroke suspected; double vision EXAM: MRI HEAD WITHOUT AND WITH CONTRAST MRA HEAD WITHOUT CONTRAST TECHNIQUE: Multiplanar, multi-echo pulse sequences of the brain and surrounding structures were acquired without and with intravenous contrast. Angiographic images of the Circle of Willis were acquired using MRA technique without intravenous contrast. CONTRAST:  9.13m GADAVIST GADOBUTROL 1 MMOL/ML IV SOLN COMPARISON:  Sep 13, 2021 FINDINGS: MRI HEAD FINDINGS Brain: A few small foci of variably reduced diffusion are identified including left dorsal pons right paramedian midbrain tegmentum, and bilateral frontal white matter. Foci on the prior study have resolved. There is a small focus of enhancement in the white matter at adjacent to the left frontal horn  (series 22, image 28). Very faint corresponding diffusion hyperintensity. This probably represents a subacute infarct. Additional patchy and confluent areas of T2 hyperintensity in the supratentorial and pontine white matter are nonspecific but probably reflect stable moderate to marked chronic microvascular ischemic changes. Chronic small vessel infarcts present including pons, thalamus, and central white matter. Scattered foci of susceptibility are again identified likely reflecting chronic microhemorrhages with distribution suggesting hypertension as the underlying etiology. No mass or mass effect.  No hydrocephalus or extra-axial collection. Vascular: Major vessel flow voids at the skull base are preserved. Skull and upper cervical spine: Normal marrow signal is preserved. Sinuses/Orbits: Paranasal sinuses are aerated. Orbits are unremarkable. Other: Sella is unremarkable.  Mastoid air cells are clear. MRA HEAD FINDINGS Anterior circulation: Intracranial internal carotid arteries are patent with atherosclerotic irregularity. There is stenosis in the right paraclinoid and left paraclinoid and supraclinoid portions consistent with findings on recent CTA. Anterior and middle cerebral arteries are patent with atherosclerotic irregularity bilateral distal ACA stenoses are identified as seen on prior CTA. Posterior circulation: Intracranial vertebral arteries are partially included but patent. Basilar artery is patent. Posterior cerebral arteries are patent with atherosclerotic irregularity. Right posterior communicating artery is present and is the primary supply of the right PCA. Other: No aneurysm. IMPRESSION: Small acute and subacute infarcts. Most relevant to the reported diplopia, there is involvement of the right periaqueductal gray matter. Moderate to marked chronic microvascular ischemic changes with chronic small vessel infarcts. No proximal intracranial  vessel occlusion. Atherosclerosis as seen on recent  CTA. Electronically Signed   By: Macy Mis M.D.   On: 11/07/2021 19:40     PHYSICAL EXAM  Pulse Rate:  [75-108] 84 (07/11 0800) Resp:  [11-24] 18 (07/11 0800) BP: (101-135)/(47-81) 104/71 (07/11 0800) SpO2:  [94 %-100 %] 96 % (07/11 0800)  General - Well nourished, well developed, in no apparent distress.  Ophthalmologic - fundi not visualized due to noncooperation.  Cardiovascular - Regular rhythm and rate.  Mental Status -  Level of arousal and orientation to time, place, and person were intact. Language including expression, naming, repetition, comprehension was assessed and found intact. Fund of Knowledge was assessed and was intact.  Cranial Nerves II - XII - II - Visual field intact OU. III, IV, VI - Extraocular movements exam showed right INO V - Facial sensation intact bilaterally. VII - Facial movement intact bilaterally. VIII - Hearing & vestibular intact bilaterally. X - Palate elevates symmetrically. XI - Chin turning & shoulder shrug intact bilaterally. XII - Tongue protrusion intact.  Motor Strength - The patient's strength was normal in all extremities and pronator drift was absent except left shoulder mildly decreased ROM, chronic.  Bulk was normal and fasciculations were absent.   Motor Tone - Muscle tone was assessed at the neck and appendages and was normal.  Reflexes - The patient's reflexes were symmetrical in all extremities and she had no pathological reflexes.  Sensory - Light touch, temperature/pinprick were assessed and were symmetrical.    Coordination - The patient had normal movements in the hands with no ataxia or dysmetria.  Tremor was absent.  Gait and Station - deferred.   ASSESSMENT/PLAN Heather Gross is a 76 y.o. female with history of diabetes, hypertension, hyperlipidemia, CKD admitted for double vision. No tPA given due to outside window.    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 MRA bilateral siphon, right M2 atherosclerosis Carotid Doppler unremarkable 2D Echo pending LE venous Doppler negative for DVT Consider loop recorder if above work-up negative. LDL 28 HgbA1c 7.0 Lovenox for VTE prophylaxis clopidogrel 75 mg daily prior to admission, now on aspirin 81 mg daily and Brilinta (ticagrelor) 90 mg bid for 30 days and then Plavix alone. Patient counseled to be compliant with her antithrombotic medications Ongoing aggressive stroke risk factor management Therapy recommendations: Home health PT and outpatient OT Disposition: Pending  History of stroke 09/01/2021 admitted for falling and difficulty speaking.  MRI showed right frontoparietal white matter small infarct.  EF 55 to 60%, LDL 60, A1c 6.5, CTA head and neck showed bilateral siphon, A3 and the right M2 stenosis.  Discharged on DAPT and Crestor 40 09/14/2021 admitted again for confusion, speech difficulty.  CT PE negative.  MRI showed subacute left thalamic infarct.  CTA head and neck unchanged.  Discharged on aspirin and Brilinta.  Diabetes HgbA1c 7.0 goal < 7.0 Uncontrolled CBG monitoring SSI DM education and close PCP follow up  Hypertension Stable Avoid low BP Long term BP goal normotensive  Hyperlipidemia Home meds: Crestor 40 LDL 28, goal < 70 Now on Crestor 40 Continue statin at discharge  Other Stroke Risk Factors Advanced age  Other Active Problems CKD 3A, creatinine 1.8  Hospital day # 0    Rosalin Hawking, MD PhD Stroke Neurology 11/08/2021 11:29 AM    To contact Stroke Continuity provider, please refer to [email or web address] After  hours, contact General Neurology

## 2021-11-08 NOTE — Progress Notes (Signed)
Physical Therapy Evaluation Patient Details Name: Heather Gross MRN: [id number] DOB: August 16, 1945 Today's Date: 11/08/2021  History of Present Illness  76 yo female presenting to ED on 7/10 with diplopia. Recently came to ED on 7/8 for diplopia, MRI was ordered but wait was too long so pt left. 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 PMH including HTN, HLD, and prior strokes.  Clinical Impression  Pt presents to PT with decr stability with gait and tendency to veer left (if not using walker). At baseline pt was modified independent with cane and is motivated to work toward return to baseline. Will follow pt acutely and feel she could benefit from OPPT after dc.        Recommendations for follow up therapy are one component of a multi-disciplinary discharge planning process, led by the attending physician.  Recommendations may be updated based on patient status, additional functional criteria and insurance authorization.  Follow Up Recommendations Home health PT      Assistance Recommended at Discharge Intermittent Supervision/Assistance  Patient can return home with the following  Help with stairs or ramp for entrance;A little help with walking and/or transfers    Equipment Recommendations None recommended by PT  Recommendations for Other Services       Functional Status Assessment Patient has had a recent decline in their functional status and demonstrates the ability to make significant improvements in function in a reasonable and predictable amount of time.     Precautions / Restrictions Precautions Precautions: Fall      Mobility  Bed Mobility Overal bed mobility: Modified Independent             General bed mobility comments: Came to long sitting on stretcher and stood from end of stretcher    Transfers Overall transfer level: Needs assistance Equipment used: Rolling walker (2 wheels) Transfers: Sit to/from Stand Sit to  Stand: Supervision           General transfer comment: Supervision for safety    Ambulation/Gait Ambulation/Gait assistance: Min guard Gait Distance (Feet): 175 Feet Assistive device: Rolling walker (2 wheels) Gait Pattern/deviations: Step-through pattern, Decreased step length - right, Decreased step length - left, Decreased stride length Gait velocity: decr Gait velocity interpretation: 1.31 - 2.62 ft/sec, indicative of limited community ambulator   General Gait Details: Verbal cues to stay closer to walker. Pt with tendency to be slightly left of center relative to walker. No loss of balance with walker  Stairs            Wheelchair Mobility    Modified Rankin (Stroke Patients Only) Modified Rankin (Stroke Patients Only) Pre-Morbid Rankin Score: No significant disability Modified Rankin: Moderately severe disability     Balance Overall balance assessment: Needs assistance Sitting-balance support: No upper extremity supported, Feet supported Sitting balance-Leahy Scale: Good     Standing balance support: No upper extremity supported, During functional activity Standing balance-Leahy Scale: Fair                               Pertinent Vitals/Pain Pain Assessment Pain Assessment: No/denies pain    Home Living Family/patient expects to be discharged to:: Private residence Living Arrangements: Children;Spouse/significant other Available Help at Discharge: Family;Available 24 hours/day Type of Home: House Home Access: Stairs to enter Entrance Stairs-Rails: None Entrance Stairs-Number of Steps: 2   Home Layout: One level Home Equipment: Conservation officer, nature (2 wheels);Cane - single point  Prior Function Prior Level of Function : Independent/Modified Independent;Driving             Mobility Comments: Using straight cane       Hand Dominance   Dominant Hand: Right    Extremity/Trunk Assessment   Upper Extremity Assessment Upper  Extremity Assessment: Defer to OT evaluation    Lower Extremity Assessment Lower Extremity Assessment: Generalized weakness    Cervical / Trunk Assessment Cervical / Trunk Assessment: Normal  Communication   Communication: No difficulties  Cognition Arousal/Alertness: Awake/alert Behavior During Therapy: WFL for tasks assessed/performed Overall Cognitive Status: Within Functional Limits for tasks assessed                                          General Comments General comments (skin integrity, edema, etc.): VSS on RA    Exercises     Assessment/Plan    PT Assessment Patient needs continued PT services  PT Problem List Decreased strength;Decreased balance;Decreased mobility;Decreased knowledge of use of DME       PT Treatment Interventions DME instruction;Gait training;Stair training;Functional mobility training;Therapeutic activities;Therapeutic exercise;Balance training;Patient/family education    PT Goals (Current goals can be found in the Care Plan section)  Acute Rehab PT Goals Patient Stated Goal: return home PT Goal Formulation: With patient Time For Goal Achievement: 11/22/21 Potential to Achieve Goals: Good    Frequency Min 4X/week     Co-evaluation               AM-PAC PT "6 Clicks" Mobility  Outcome Measure Help needed turning from your back to your side while in a flat bed without using bedrails?: None Help needed moving from lying on your back to sitting on the side of a flat bed without using bedrails?: None Help needed moving to and from a bed to a chair (including a wheelchair)?: A Little Help needed standing up from a chair using your arms (e.g., wheelchair or bedside chair)?: A Little Help needed to walk in hospital room?: A Little Help needed climbing 3-5 steps with a railing? : A Little 6 Click Score: 20    End of Session   Activity Tolerance: Patient tolerated treatment well Patient left: in chair;with call  bell/phone within reach Nurse Communication: Mobility status PT Visit Diagnosis: Other abnormalities of gait and mobility (R26.89);Muscle weakness (generalized) (M62.81)    Time: 5053-9767 PT Time Calculation (min) (ACUTE ONLY): 17 min   Charges:   PT Evaluation $PT Eval Moderate Complexity: 1 [place] 11/08/2021, 1:44 PM

## 2021-11-08 NOTE — ED Notes (Signed)
Patient ambulatory to restroom with cane.  Patient walking towards left.  No weakness noted to L leg.  C/o double vision.

## 2021-11-09 ENCOUNTER — Other Ambulatory Visit (HOSPITAL_COMMUNITY): Payer: Self-pay

## 2021-11-09 ENCOUNTER — Observation Stay (HOSPITAL_BASED_OUTPATIENT_CLINIC_OR_DEPARTMENT_OTHER): Payer: Medicare HMO

## 2021-11-09 ENCOUNTER — Observation Stay (HOSPITAL_BASED_OUTPATIENT_CLINIC_OR_DEPARTMENT_OTHER)
Admit: 2021-11-09 | Discharge: 2021-11-09 | Disposition: A | Discharge status: Home / Self Care | Payer: Medicare HMO | Attending: Student | Admitting: Student

## 2021-11-09 DIAGNOSIS — E1122 Type 2 diabetes mellitus with diabetic chronic kidney disease: Secondary | ICD-10-CM

## 2021-11-09 DIAGNOSIS — I10 Essential (primary) hypertension: Secondary | ICD-10-CM | POA: Diagnosis not present

## 2021-11-09 DIAGNOSIS — I471 Supraventricular tachycardia: Secondary | ICD-10-CM

## 2021-11-09 DIAGNOSIS — N182 Chronic kidney disease, stage 2 (mild): Secondary | ICD-10-CM

## 2021-11-09 DIAGNOSIS — E785 Hyperlipidemia, unspecified: Secondary | ICD-10-CM | POA: Diagnosis not present

## 2021-11-09 DIAGNOSIS — I6389 Other cerebral infarction: Secondary | ICD-10-CM

## 2021-11-09 DIAGNOSIS — N1831 Chronic kidney disease, stage 3a: Secondary | ICD-10-CM | POA: Diagnosis not present

## 2021-11-09 DIAGNOSIS — I639 Cerebral infarction, unspecified: Secondary | ICD-10-CM | POA: Diagnosis not present

## 2021-11-09 LAB — GLUCOSE, CAPILLARY
Glucose-Capillary: 125 mg/dL — ABNORMAL HIGH (ref 70–99)
Glucose-Capillary: 117 mg/dL — ABNORMAL HIGH (ref 70–99)
Glucose-Capillary: 113 mg/dL — ABNORMAL HIGH (ref 70–99)

## 2021-11-09 LAB — BASIC METABOLIC PANEL
Anion gap: 9 (ref 5–15)
BUN: 23 mg/dL (ref 8–23)
CO2: 24 mmol/L (ref 22–32)
Calcium: 9.5 mg/dL (ref 8.9–10.3)
Chloride: 105 mmol/L (ref 98–111)
Creatinine, Ser: 1.6 mg/dL — ABNORMAL HIGH (ref 0.44–1.00)
GFR, Estimated: 33 mL/min — ABNORMAL LOW (ref >60)
Glucose, Bld: 118 mg/dL — ABNORMAL HIGH (ref 70–99)
Potassium: 3.3 mmol/L — ABNORMAL LOW (ref 3.5–5.1)
Sodium: 138 mmol/L (ref 135–145)

## 2021-11-09 LAB — ECHOCARDIOGRAM COMPLETE
Area-P 1/2: 3.91 cm2
S' Lateral: 3.5 cm

## 2021-11-09 MED ORDER — ASPIRIN 81 MG PO TBEC: 81.0000 mg | DELAYED_RELEASE_TABLET | Freq: Every day | ORAL | 0 refills | Status: AC

## 2021-11-09 MED ORDER — TICAGRELOR 90 MG PO TABS
90.0000 mg | ORAL_TABLET | Freq: Two times a day (BID) | ORAL | 0 refills | Status: AC
  Filled 2021-11-09: qty 60, 30d supply, fill #0

## 2021-11-09 MED ORDER — CLOPIDOGREL BISULFATE 75 MG PO TABS: 75.0000 mg | ORAL_TABLET | Freq: Every day | ORAL | 11 refills | Status: AC

## 2021-11-09 MED ORDER — POTASSIUM CHLORIDE CRYS ER 20 MEQ PO TBCR
40.0000 meq | EXTENDED_RELEASE_TABLET | Freq: Once | ORAL | Status: AC
  Administered 2021-11-09: 40 meq via ORAL
  Filled 2021-11-09: qty 2

## 2021-11-09 NOTE — Progress Notes (Signed)
Applied at hospital  Dr. Curt Bears to read.

## 2021-11-09 NOTE — Progress Notes (Signed)
On call provider (traidhospitalist) notified of this pt vitals at midnight and also about the non-sustained v-tach pt was having, no new orders at this time, will continue to monitor

## 2021-11-09 NOTE — Progress Notes (Signed)
SLP Cancellation Note  Patient Details Name: Heather Gross MRN: [id number] DOB: 1945/05/20   Cancelled treatment:       Reason Eval/Treat Not Completed: Patient at procedure or test/unavailable  Unable to complete SLE at this time, as pt is currently off unit for testing. Noted DC orders have been signed for pt. If acute cognitive communication evaluation is not completed prior to discharge and is still needed, recommend ST orders for outpatient or home health. Will continue efforts.   Vaanya Shambaugh B. Quentin Ore, [facility], [place] Speech Language Pathologist Office: [phone]  Shonna Chock 11/09/2021, 9:15 AM

## 2021-11-09 NOTE — Discharge Summary (Signed)
Physician Discharge Summary   Patient: Heather Gross MRN: [id number] DOB: 1946-02-02  Admit date:     11/07/2021  Discharge date: 11/09/21  Discharge Physician: Corrie Mckusick Amirra Herling   PCP: [name], NP   Recommendations at discharge:   Follow-up with primary care provider in 1 week.  Check CBC BMP magnesium in the next visit Follow-up with [facility].  Internal referral has been made for 3 to 4 weeks. Zio Patch as has been ordered at this time.  Follow-up as per cardiology.  Discharge Diagnoses: Principal Problem:   Acute ischemic stroke [facility]) Active Problems:   Cerebrovascular disease   Essential hypertension   Hyperlipidemia   Type 2 diabetes mellitus with diabetic chronic kidney disease ([place])   Stage 3a chronic kidney disease (CKD) ([place]) -Baseline Scr 1.3-1.6  Resolved Problems:   * No resolved hospital problems. *  Hospital Course:  76 year old with history of hypertension, hyperlipidemia and recent stroke that were treated with aspirin and Brilinta and currently on Plavix came to the emergency room with more than 24 hours of double vision on left side.  Patient had come to emergency room 1 day ago but left because of long wait time.  Returned to ER with persistent diplopia.  Found to have  multifocal stroke so patient was admitted to the hospital.  Following conditions were addressed during hospitalization,  Acute ischemic stroke, suspected embolic  stroke now with recurrent multifocal acute and subacute strokes: Patient presented with persistent diplopia.  Has improved diplopia today.  MRI of the brain showed a small acute and subacute infarcts mostly involved right periaqueductal gray matter, mild to moderate chronic microvascular ischemic changes.MRA of the brain, no proximal intracranial vessel occlusion.  Atherosclerosis.  Recent CTA of the head and neck done on 5/23 with no significant disease.2D echocardiogram, previously normal.  Patient was seen by  neurology and has been restarted on aspirin and Brilinta for 30 days patient will continue Plavix after 30 days.  LDL 28.  On Crestor.  Lipid panel at goal.  We will continue Crestor 40 mg daily.  Cardiology was consulted for possible loop recorder.  At this time plan is Zio patch.  Cardiology aware of this  Type 2 diabetes mellitus: Well-controlled.  Latest hemoglobin A1c 7.  Continue metformin   Essential hypertension: Resume home medication regimen of olmesartan amlodipine and HCTZ on discharge   CKD stage IIIb: Outpatient monitoring and follow-up.  Consultants:  Neurology  Cardiology  Procedures performed: Zio patch application  Disposition: Home Diet recommendation:  Discharge Diet Orders (From admission, onward)     Start     Ordered   11/09/21 0000  Diet Carb Modified        11/09/21 0816           Carb modified diet DISCHARGE MEDICATION: Allergies as of 11/09/2021       Reactions   Lipitor [atorvastatin] Other (See Comments)   Made mouth very dry and the lips discolored   Shellfish-derived Products Other (See Comments)   Hands break out   Latex Rash        Medication List     STOP taking these medications    simvastatin 80 MG tablet Commonly known as: ZOCOR       TAKE these medications    aspirin EC 81 MG tablet Take 1 tablet (81 mg total) by mouth daily. Swallow whole.   Calcium 500 MG Chew Chew 2 tablets (1,000 mg total) by mouth daily.   clopidogrel 75  MG tablet Commonly known as: Plavix Take 1 tablet (75 mg total) by mouth daily. Start on December 11, 2021 Start taking on: December 11, 2021 What changed:  additional instructions These instructions start on December 11, 2021. If you are unsure what to do until then, ask your doctor or other care provider.   [employer] D3 50 MCG (2000 UT) Caps Generic drug: Cholecalciferol TAKE 1 CAPSULE BY MOUTH EVERY DAY What changed:  how much to take when to take this   ezetimibe 10 MG tablet Commonly known  as: ZETIA Take 10 mg by mouth daily.   fluticasone 50 MCG/ACT nasal spray Commonly known as: FLONASE Place 1-2 sprays into both nostrils daily as needed for allergies or rhinitis.   metFORMIN 500 MG tablet Commonly known as: GLUCOPHAGE TAKE 1 TABLET BY MOUTH AT BEDTIME. What changed: when to take this   multivitamin tablet Take 1 tablet by mouth daily with breakfast.   Olmesartan-amLODIPine-HCTZ 40-5-25 MG Tabs Take 1 tablet by mouth daily.   pantoprazole 20 MG tablet Commonly known as: Protonix Take 1 tablet (20 mg total) by mouth daily. What changed:  when to take this reasons to take this   rosuvastatin 40 MG tablet Commonly known as: CRESTOR Take 40 mg by mouth at bedtime.   ticagrelor 90 MG Tabs tablet Commonly known as: BRILINTA Take 1 tablet (90 mg total) by mouth 2 (two) times daily.        Follow-up Information     [name], NP Follow up in 1 week(s).   Specialty: Nurse Practitioner Contact information: [place] [address] [phone]                Subjective Patient was seen and examined at bedside.  States that her diplopia is gone today.  Discharge Exam:    11/09/2021    7:44 AM 11/09/2021    4:29 AM 11/09/2021   12:22 AM  Vitals with BMI  Systolic [phone]  Diastolic 70 77 76  Pulse 95 94 96   There is no height or weight on file to calculate BMI.  General:  Average built, not in obvious distress HENT:   No scleral pallor or icterus noted. Oral mucosa is moist.  Chest:  Clear breath sounds.  Diminished breath sounds bilaterally. No crackles or wheezes.  CVS: S1 &S2 heard. No murmur.  Regular rate and rhythm. Abdomen: Soft, nontender, nondistended.  Bowel sounds are heard.   Extremities: No cyanosis, clubbing or edema.  Peripheral pulses are palpable. Psych: Alert, awake and oriented, normal mood CNS:  No cranial nerve deficits.  Power equal in all extremities.   Skin: Warm and dry.  No rashes  noted.  Condition at discharge: good  The results of significant diagnostics from this hospitalization (including imaging, microbiology, ancillary and laboratory) are listed below for reference.   Imaging Studies: VAS [place] LOWER EXTREMITY VENOUS (DVT)  Result Date: 11/08/2021  Lower Venous DVT Study Patient Name:  RANELL FINELLI  Date of Exam:   11/08/2021 Medical Rec #: [id number]       Accession #:    [id number] Date of Birth: 10-25-1945        Patient Gender: F Patient Age:   68 years Exam Location:  [facility] Procedure:      VAS [place] LOWER EXTREMITY VENOUS (DVT) Referring Phys: Cornelius Moras XU --------------------------------------------------------------------------------  Indications: Stroke.  Comparison Study: No previous Performing Technologist: Jody Hill RVT, RDMS  Examination Guidelines: A complete evaluation includes  B-mode imaging, spectral Doppler, color Doppler, and power Doppler as needed of all accessible portions of each vessel. Bilateral testing is considered an integral part of a complete examination. Limited examinations for reoccurring indications may be performed as noted. The reflux portion of the exam is performed with the patient in reverse Trendelenburg.  +---------+---------------+---------+-----------+----------+--------------+ RIGHT    CompressibilityPhasicitySpontaneityPropertiesThrombus Aging +---------+---------------+---------+-----------+----------+--------------+ CFV      Full           Yes      Yes                                 +---------+---------------+---------+-----------+----------+--------------+ SFJ      Full                                                        +---------+---------------+---------+-----------+----------+--------------+ FV Prox  Full           Yes      Yes                                 +---------+---------------+---------+-----------+----------+--------------+ FV Mid   Full           Yes      Yes                                  +---------+---------------+---------+-----------+----------+--------------+ FV DistalFull           Yes      Yes                                 +---------+---------------+---------+-----------+----------+--------------+ PFV      Full                                                        +---------+---------------+---------+-----------+----------+--------------+ POP      Full           Yes      Yes                                 +---------+---------------+---------+-----------+----------+--------------+ PTV      Full                                                        +---------+---------------+---------+-----------+----------+--------------+ PERO     Full                                                        +---------+---------------+---------+-----------+----------+--------------+   +---------+---------------+---------+-----------+----------+--------------+ LEFT     CompressibilityPhasicitySpontaneityPropertiesThrombus Aging +---------+---------------+---------+-----------+----------+--------------+  CFV      Full           Yes      Yes                                 +---------+---------------+---------+-----------+----------+--------------+ SFJ      Full                                                        +---------+---------------+---------+-----------+----------+--------------+ FV Prox  Full           Yes      Yes                                 +---------+---------------+---------+-----------+----------+--------------+ FV Mid   Full           Yes      Yes                                 +---------+---------------+---------+-----------+----------+--------------+ FV DistalFull           Yes      Yes                                 +---------+---------------+---------+-----------+----------+--------------+ PFV      Full                                                         +---------+---------------+---------+-----------+----------+--------------+ POP      Full           Yes      Yes                                 +---------+---------------+---------+-----------+----------+--------------+ PTV      Full                                                        +---------+---------------+---------+-----------+----------+--------------+ PERO     Full                                                        +---------+---------------+---------+-----------+----------+--------------+     Summary: BILATERAL: - No evidence of deep vein thrombosis seen in the lower extremities, bilaterally. -No evidence of popliteal cyst, bilaterally.   *See table(s) above for measurements and observations. Electronically signed by Servando Snare MD on 11/08/2021 at 2:59:24 PM.    Final    VAS US CAROTID  Result Date: 11/08/2021 Carotid Arterial Duplex Study Patient Name:  Elson Areas  Date of Exam:  11/08/2021 Medical Rec #: [id number]       Accession #:    [id number] Date of Birth: 1946/02/09        Patient Gender: F Patient Age:   76 years Exam Location:  [facility] Procedure:      VAS US CAROTID Referring Phys: Cornelius Moras XU --------------------------------------------------------------------------------  Indications:       CVA. Risk Factors:      Hypertension, hyperlipidemia, Diabetes, past history of                    smoking, prior CVA. Comparison Study:  No previous exams Performing Technologist: Jody Hill RVT, RDMS  Examination Guidelines: A complete evaluation includes B-mode imaging, spectral Doppler, color Doppler, and power Doppler as needed of all accessible portions of each vessel. Bilateral testing is considered an integral part of a complete examination. Limited examinations for reoccurring indications may be performed as noted.  Right Carotid Findings: +----------+--------+--------+--------+------------------+--------+           PSV cm/sEDV cm/sStenosisPlaque  DescriptionComments +----------+--------+--------+--------+------------------+--------+ CCA Prox  58      13                                         +----------+--------+--------+--------+------------------+--------+ CCA Distal68      10                                         +----------+--------+--------+--------+------------------+--------+ ICA Prox  42      12                                         +----------+--------+--------+--------+------------------+--------+ ICA Distal44      11                                         +----------+--------+--------+--------+------------------+--------+ ECA       102     7                                          +----------+--------+--------+--------+------------------+--------+ +----------+--------+-------+----------------+-------------------+           PSV cm/sEDV cmsDescribe        Arm Pressure (mmHG) +----------+--------+-------+----------------+-------------------+ [id number]             Multiphasic, WNL                    +----------+--------+-------+----------------+-------------------+ +---------+--------+--+--------+--+---------+ VertebralPSV cm/s50EDV cm/s19Antegrade +---------+--------+--+--------+--+---------+  Left Carotid Findings: +----------+--------+--------+--------+------------------+------------------+           PSV cm/sEDV cm/sStenosisPlaque DescriptionComments           +----------+--------+--------+--------+------------------+------------------+ CCA Prox  65      12                                                   +----------+--------+--------+--------+------------------+------------------+ CCA Distal77      13  intimal thickening +----------+--------+--------+--------+------------------+------------------+ ICA Prox  51      14                                                    +----------+--------+--------+--------+------------------+------------------+ ICA Distal42      13                                                   +----------+--------+--------+--------+------------------+------------------+ ECA       102     0                                 intimal thickening +----------+--------+--------+--------+------------------+------------------+ +----------+--------+--------+----------------+-------------------+           PSV cm/sEDV cm/sDescribe        Arm Pressure (mmHG) +----------+--------+--------+----------------+-------------------+ [id number]              Multiphasic, WNL                    +----------+--------+--------+----------------+-------------------+ +---------+--------+--+--------+-+---------+ VertebralPSV cm/s35EDV cm/s8Antegrade +---------+--------+--+--------+-+---------+   Summary: Right Carotid: The extracranial vessels were near-normal with only minimal wall                thickening or plaque. Left Carotid: The extracranial vessels were near-normal with only minimal wall               thickening or plaque. Vertebrals:  Bilateral vertebral arteries demonstrate antegrade flow. Subclavians: Normal flow hemodynamics were seen in bilateral subclavian              arteries. *See table(s) above for measurements and observations.  Electronically signed by Servando Snare MD on 11/08/2021 at 2:54:23 PM.    Final    MR Brain W and Wo Contrast  Result Date: 11/07/2021 CLINICAL DATA:  Neuro deficit, acute, stroke suspected; double vision EXAM: MRI HEAD WITHOUT AND WITH CONTRAST MRA HEAD WITHOUT CONTRAST TECHNIQUE: Multiplanar, multi-echo pulse sequences of the brain and surrounding structures were acquired without and with intravenous contrast. Angiographic images of the Circle of Willis were acquired using MRA technique without intravenous contrast. CONTRAST:  9.81m GADAVIST GADOBUTROL 1 MMOL/ML IV SOLN COMPARISON:  Sep 13, 2021 FINDINGS: MRI  HEAD FINDINGS Brain: A few small foci of variably reduced diffusion are identified including left dorsal pons right paramedian midbrain tegmentum, and bilateral frontal white matter. Foci on the prior study have resolved. There is a small focus of enhancement in the white matter at adjacent to the left frontal horn (series 22, image 28). Very faint corresponding diffusion hyperintensity. This probably represents a subacute infarct. Additional patchy and confluent areas of T2 hyperintensity in the supratentorial and pontine white matter are nonspecific but probably reflect stable moderate to marked chronic microvascular ischemic changes. Chronic small vessel infarcts present including pons, thalamus, and central white matter. Scattered foci of susceptibility are again identified likely reflecting chronic microhemorrhages with distribution suggesting hypertension as the underlying etiology. No mass or mass effect.  No hydrocephalus or extra-axial collection. Vascular: Major vessel flow voids at the skull base are preserved. Skull and upper cervical spine: Normal marrow signal is preserved. Sinuses/Orbits: Paranasal sinuses are aerated. Orbits are  unremarkable. Other: Sella is unremarkable.  Mastoid air cells are clear. MRA HEAD FINDINGS Anterior circulation: Intracranial internal carotid arteries are patent with atherosclerotic irregularity. There is stenosis in the right paraclinoid and left paraclinoid and supraclinoid portions consistent with findings on recent CTA. Anterior and middle cerebral arteries are patent with atherosclerotic irregularity bilateral distal ACA stenoses are identified as seen on prior CTA. Posterior circulation: Intracranial vertebral arteries are partially included but patent. Basilar artery is patent. Posterior cerebral arteries are patent with atherosclerotic irregularity. Right posterior communicating artery is present and is the primary supply of the right PCA. Other: No aneurysm.  IMPRESSION: Small acute and subacute infarcts. Most relevant to the reported diplopia, there is involvement of the right periaqueductal gray matter. Moderate to marked chronic microvascular ischemic changes with chronic small vessel infarcts. No proximal intracranial vessel occlusion. Atherosclerosis as seen on recent CTA. Electronically Signed   By: Macy Mis M.D.   On: 11/07/2021 19:40   MR ANGIO HEAD WO CONTRAST  Result Date: 11/07/2021 CLINICAL DATA:  Neuro deficit, acute, stroke suspected; double vision EXAM: MRI HEAD WITHOUT AND WITH CONTRAST MRA HEAD WITHOUT CONTRAST TECHNIQUE: Multiplanar, multi-echo pulse sequences of the brain and surrounding structures were acquired without and with intravenous contrast. Angiographic images of the Circle of Willis were acquired using MRA technique without intravenous contrast. CONTRAST:  9.8m GADAVIST GADOBUTROL 1 MMOL/ML IV SOLN COMPARISON:  Sep 13, 2021 FINDINGS: MRI HEAD FINDINGS Brain: A few small foci of variably reduced diffusion are identified including left dorsal pons right paramedian midbrain tegmentum, and bilateral frontal white matter. Foci on the prior study have resolved. There is a small focus of enhancement in the white matter at adjacent to the left frontal horn (series 22, image 28). Very faint corresponding diffusion hyperintensity. This probably represents a subacute infarct. Additional patchy and confluent areas of T2 hyperintensity in the supratentorial and pontine white matter are nonspecific but probably reflect stable moderate to marked chronic microvascular ischemic changes. Chronic small vessel infarcts present including pons, thalamus, and central white matter. Scattered foci of susceptibility are again identified likely reflecting chronic microhemorrhages with distribution suggesting hypertension as the underlying etiology. No mass or mass effect.  No hydrocephalus or extra-axial collection. Vascular: Major vessel flow voids at the  skull base are preserved. Skull and upper cervical spine: Normal marrow signal is preserved. Sinuses/Orbits: Paranasal sinuses are aerated. Orbits are unremarkable. Other: Sella is unremarkable.  Mastoid air cells are clear. MRA HEAD FINDINGS Anterior circulation: Intracranial internal carotid arteries are patent with atherosclerotic irregularity. There is stenosis in the right paraclinoid and left paraclinoid and supraclinoid portions consistent with findings on recent CTA. Anterior and middle cerebral arteries are patent with atherosclerotic irregularity bilateral distal ACA stenoses are identified as seen on prior CTA. Posterior circulation: Intracranial vertebral arteries are partially included but patent. Basilar artery is patent. Posterior cerebral arteries are patent with atherosclerotic irregularity. Right posterior communicating artery is present and is the primary supply of the right PCA. Other: No aneurysm. IMPRESSION: Small acute and subacute infarcts. Most relevant to the reported diplopia, there is involvement of the right periaqueductal gray matter. Moderate to marked chronic microvascular ischemic changes with chronic small vessel infarcts. No proximal intracranial vessel occlusion. Atherosclerosis as seen on recent CTA. Electronically Signed   By: PMacy MisM.D.   On: 11/07/2021 19:40    Microbiology: No results found for this or any previous visit.  Labs: CBC: Recent Labs  Lab 11/06/21 1801 11/07/21 1100  WBC 8.1  --  NEUTROABS 4.9  --   HGB 11.9* 11.9*  HCT 35.7* 35.0*  MCV 93.0  --   PLT 194  --    Basic Metabolic Panel: Recent Labs  Lab 11/06/21 1801 11/07/21 1100 11/09/21 0447  NA 141 138 138  K 4.0 4.4 3.3*  CL 104 103 105  CO2 23  --  24  GLUCOSE 118* 155* 118*  BUN 22 24* 23  CREATININE 1.78* 1.80* 1.60*  CALCIUM 10.2  --  9.5   Liver Function Tests: Recent Labs  Lab 11/06/21 1801  AST 26  ALT 27  ALKPHOS 43  BILITOT 0.5  PROT 7.3  ALBUMIN 3.9    CBG: Recent Labs  Lab 11/08/21 0831 11/08/21 1142 11/08/21 1655 11/08/21 2203 11/09/21 0618  GLUCAP 131* 147* 137* 114* 125*    Discharge time spent: greater than 30 minutes.  Signed: Flora Lipps, MD [facility] 11/09/2021

## 2021-11-09 NOTE — Plan of Care (Signed)
Problem: Education: Goal: Ability to describe self-care measures that may prevent or decrease complications (Diabetes Survival Skills Education) will improve Outcome: Adequate for Discharge Goal: Individualized Educational Video(s) Outcome: Adequate for Discharge   Problem: Coping: Goal: Ability to adjust to condition or change in health will improve Outcome: Adequate for Discharge   Problem: Fluid Volume: Goal: Ability to maintain a balanced intake and output will improve Outcome: Adequate for Discharge   Problem: Health Behavior/Discharge Planning: Goal: Ability to identify and utilize available resources and services will improve Outcome: Adequate for Discharge Goal: Ability to manage health-related needs will improve Outcome: Adequate for Discharge   Problem: Metabolic: Goal: Ability to maintain appropriate glucose levels will improve Outcome: Adequate for Discharge   Problem: Nutritional: Goal: Maintenance of adequate nutrition will improve Outcome: Adequate for Discharge Goal: Progress toward achieving an optimal weight will improve Outcome: Adequate for Discharge   Problem: Skin Integrity: Goal: Risk for impaired skin integrity will decrease Outcome: Adequate for Discharge   Problem: Education: Goal: Knowledge of General Education information will improve Description: Including pain rating scale, medication(s)/side effects and non-pharmacologic comfort measures Outcome: Adequate for Discharge   Problem: Tissue Perfusion: Goal: Adequacy of tissue perfusion will improve Outcome: Adequate for Discharge   Problem: Health Behavior/Discharge Planning: Goal: Ability to manage health-related needs will improve Outcome: Adequate for Discharge   Problem: Clinical Measurements: Goal: Ability to maintain clinical measurements within normal limits will improve Outcome: Adequate for Discharge Goal: Will remain free from infection Outcome: Adequate for Discharge Goal:  Diagnostic test results will improve Outcome: Adequate for Discharge Goal: Respiratory complications will improve Outcome: Adequate for Discharge Goal: Cardiovascular complication will be avoided Outcome: Adequate for Discharge   Problem: Activity: Goal: Risk for activity intolerance will decrease Outcome: Adequate for Discharge   Problem: Nutrition: Goal: Adequate nutrition will be maintained Outcome: Adequate for Discharge   Problem: Coping: Goal: Level of anxiety will decrease Outcome: Adequate for Discharge   Problem: Elimination: Goal: Will not experience complications related to bowel motility Outcome: Adequate for Discharge Goal: Will not experience complications related to urinary retention Outcome: Adequate for Discharge   Problem: Pain Managment: Goal: General experience of comfort will improve Outcome: Adequate for Discharge   Problem: Safety: Goal: Ability to remain free from injury will improve Outcome: Adequate for Discharge   Problem: Skin Integrity: Goal: Risk for impaired skin integrity will decrease Outcome: Adequate for Discharge   Problem: Acute Rehab OT Goals (only OT should resolve) Goal: Pt. Will Perform Grooming Outcome: Adequate for Discharge Goal: OT Additional ADL Goal #1 Outcome: Adequate for Discharge Goal: OT Additional ADL Goal #2 Outcome: Adequate for Discharge   Problem: Education: Goal: Knowledge of disease or condition will improve Outcome: Adequate for Discharge Goal: Knowledge of secondary prevention will improve (SELECT ALL) Outcome: Adequate for Discharge Goal: Knowledge of patient specific risk factors will improve (INDIVIDUALIZE FOR PATIENT) Outcome: Adequate for Discharge Goal: Individualized Educational Video(s) Outcome: Adequate for Discharge   Problem: Coping: Goal: Will verbalize positive feelings about self Outcome: Adequate for Discharge Goal: Will identify appropriate support needs Outcome: Adequate for  Discharge   Problem: Health Behavior/Discharge Planning: Goal: Ability to manage health-related needs will improve Outcome: Adequate for Discharge   Problem: Self-Care: Goal: Ability to participate in self-care as condition permits will improve Outcome: Adequate for Discharge Goal: Verbalization of feelings and concerns over difficulty with self-care will improve Outcome: Adequate for Discharge Goal: Ability to communicate needs accurately will improve Outcome: Adequate for Discharge   Problem: Nutrition:  Goal: Risk of aspiration will decrease Outcome: Adequate for Discharge Goal: Dietary intake will improve Outcome: Adequate for Discharge   Problem: Ischemic Stroke/TIA Tissue Perfusion: Goal: Complications of ischemic stroke/TIA will be minimized Outcome: Adequate for Discharge   Problem: Acute Rehab PT Goals(only PT should resolve) Goal: Patient Will Transfer Sit To/From Stand Outcome: Adequate for Discharge Goal: Pt Will Ambulate Outcome: Adequate for Discharge Goal: Pt Will Go Up/Down Stairs Outcome: Adequate for Discharge

## 2021-11-09 NOTE — Progress Notes (Signed)
Physical Therapy Treatment Patient Details Name: Heather Gross MRN: [id number] DOB: December 18, 1945 Today's Date: 11/09/2021   History of Present Illness 76 yo female presenting to ED on 7/10 with diplopia. Recently came to ED on 7/8 for diplopia, MRI was ordered but wait was too long so pt left. 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 PMH including HTN, HLD, and prior strokes.    PT Comments    Pt in bathroom on arrival.  Performed gt training and stair training. She required cues for safety.  She continues to present with L inattention.  Pt has a tendency to push RW too far forward.  Pt will have support from family at home with plans to f/u with OPPT.      Recommendations for follow up therapy are one component of a multi-disciplinary discharge planning process, led by the attending physician.  Recommendations may be updated based on patient status, additional functional criteria and insurance authorization.  Follow Up Recommendations  Home health PT     Assistance Recommended at Discharge Intermittent Supervision/Assistance  Patient can return home with the following Help with stairs or ramp for entrance;A little help with walking and/or transfers   Equipment Recommendations  None recommended by PT    Recommendations for Other Services       Precautions / Restrictions Precautions Precautions: Fall Restrictions Weight Bearing Restrictions: No     Mobility  Bed Mobility Overal bed mobility: Modified Independent             General bed mobility comments: pt remained in bed during session    Transfers Overall transfer level: Needs assistance Equipment used: Rolling walker (2 wheels) Transfers: Sit to/from Stand Sit to Stand: Supervision           General transfer comment: Supervision for safety    Ambulation/Gait Ambulation/Gait assistance: Supervision Gait Distance (Feet): 180 Feet Assistive device: Rolling walker  (2 wheels) Gait Pattern/deviations: Step-through pattern, Decreased step length - right, Decreased step length - left, Decreased stride length Gait velocity: decr     General Gait Details: Verbal cues to stay closer to walker. Pt with tendency to be slightly left of center relative to walker. No loss of balance with walker.  L inattention noted.   Stairs Stairs: Yes Stairs assistance: Min assist Stair Management: No rails, Step to pattern, Backwards, Forwards Number of Stairs: 2 General stair comments: Cues for sequencing and safety this session.  Required cues for RW placement.  Tolerated trial well.  Family ed performed for carryover this session.   Wheelchair Mobility    Modified Rankin (Stroke Patients Only) Modified Rankin (Stroke Patients Only) Pre-Morbid Rankin Score: No significant disability Modified Rankin: Moderately severe disability     Balance Overall balance assessment: Needs assistance Sitting-balance support: No upper extremity supported, Feet supported Sitting balance-Leahy Scale: Good       Standing balance-Leahy Scale: Fair                              Cognition Arousal/Alertness: Awake/alert Behavior During Therapy: WFL for tasks assessed/performed Overall Cognitive Status: Within Functional Limits for tasks assessed                                 General Comments: Very motivated        Exercises      General Comments General comments (  skin integrity, edema, etc.): family member present during session      Pertinent Vitals/Pain Pain Assessment Pain Assessment: No/denies pain    Home Living                          Prior Function            PT Goals (current goals can now be found in the care plan section) Acute Rehab PT Goals Patient Stated Goal: return home Potential to Achieve Goals: Good Progress towards PT goals: Progressing toward goals    Frequency    Min 4X/week      PT Plan  Current plan remains appropriate    Co-evaluation              AM-PAC PT "6 Clicks" Mobility   Outcome Measure  Help needed turning from your back to your side while in a flat bed without using bedrails?: None Help needed moving from lying on your back to sitting on the side of a flat bed without using bedrails?: None Help needed moving to and from a bed to a chair (including a wheelchair)?: A Little Help needed standing up from a chair using your arms (e.g., wheelchair or bedside chair)?: A Little Help needed to walk in hospital room?: A Little Help needed climbing 3-5 steps with a railing? : A Little 6 Click Score: 20    End of Session Equipment Utilized During Treatment: Gait belt Activity Tolerance: Patient tolerated treatment well Patient left: with call bell/phone within reach;in bed Nurse Communication: Mobility status PT Visit Diagnosis: Other abnormalities of gait and mobility (R26.89);Muscle weakness (generalized) (M62.81)     Time: 1324-4010 PT Time Calculation (min) (ACUTE ONLY): 17 min  Charges:  $Gait Training: 8-22 mins                     Erasmo Leventhal , PTA Acute Rehabilitation Services Office [phone]    Cristela Blue 11/09/2021, 12:47 PM

## 2021-11-09 NOTE — Progress Notes (Signed)
STROKE TEAM PROGRESS NOTE   SUBJECTIVE (INTERVAL HISTORY) No acute event overnight.  Patient right INO is improving.  Diplopia improved some. OT put on eye occluder. She had several runs of atrial tachycardia overnight; No true fib, but high likelihood of potential A-fib.  EP recommend monitor and outpatient follow up.     OBJECTIVE Temp:  [97.4 F (36.3 C)-98.3 F (36.8 C)] 97.4 F (36.3 C) (07/12 1150) Pulse Rate:  [88-98] 88 (07/12 1150) Cardiac Rhythm: Normal sinus rhythm (07/12 0838) Resp:  [16-18] 16 (07/12 1150) BP: (110-133)/(70-77) 133/71 (07/12 1150) SpO2:  [96 %-100 %] 96 % (07/12 1150)  Recent Labs  Lab 11/08/21 1655 11/08/21 2203 11/09/21 0618 11/09/21 0822 11/09/21 1153  GLUCAP 137* 114* 125* 117* 113*   Recent Labs  Lab 11/06/21 1801 11/07/21 1100 11/09/21 0447  NA 141 138 138  K 4.0 4.4 3.3*  CL 104 103 105  CO2 23  --  24  GLUCOSE 118* 155* 118*  BUN 22 24* 23  CREATININE 1.78* 1.80* 1.60*  CALCIUM 10.2  --  9.5   Recent Labs  Lab 11/06/21 1801  AST 26  ALT 27  ALKPHOS 43  BILITOT 0.5  PROT 7.3  ALBUMIN 3.9   Recent Labs  Lab 11/06/21 1801 11/07/21 1100  WBC 8.1  --   NEUTROABS 4.9  --   HGB 11.9* 11.9*  HCT 35.7* 35.0*  MCV 93.0  --   PLT 194  --    No results for input(s): "CKTOTAL", "CKMB", "CKMBINDEX", "TROPONINI" in the last 168 hours. Recent Labs    11/06/21 1801  LABPROT 12.7  INR 1.0   No results for input(s): "COLORURINE", "LABSPEC", "PHURINE", "GLUCOSEU", "HGBUR", "BILIRUBINUR", "KETONESUR", "PROTEINUR", "UROBILINOGEN", "NITRITE", "LEUKOCYTESUR" in the last 72 hours.  Invalid input(s): "APPERANCEUR"     Component Value Date/Time   CHOL 91 11/08/2021 0517   TRIG 166 (H) 11/08/2021 0517   HDL 30 (L) 11/08/2021 0517   CHOLHDL 3.0 11/08/2021 0517   VLDL 33 11/08/2021 0517   LDLCALC 28 11/08/2021 0517   LDLCALC 93 12/30/2019 0821   Lab Results  Component Value Date   HGBA1C 7.0 (H) 11/08/2021      Component  Value Date/Time   LABOPIA NONE DETECTED 11/08/2021 1010   COCAINSCRNUR NONE DETECTED 11/08/2021 1010   LABBENZ NONE DETECTED 11/08/2021 1010   AMPHETMU NONE DETECTED 11/08/2021 1010   THCU NONE DETECTED 11/08/2021 1010   LABBARB NONE DETECTED 11/08/2021 1010    Recent Labs  Lab 11/06/21 1801  ETH <10    I have personally reviewed the radiological images below and agree with the radiology interpretations.  ECHOCARDIOGRAM COMPLETE  Result Date: 11/09/2021    ECHOCARDIOGRAM REPORT   Patient Name:   Heather Gross Date of Exam: 11/09/2021 Medical Rec #:  [id number]      Height:       65.0 in Accession #:    [id number]     Weight:       188.8 lb Date of Birth:  [birth year]       BSA:          1.930 m Patient Age:    76 years       BP:           125/70 mmHg Patient Gender: F              HR:           82 bpm. Exam Location:  Inpatient Procedure: 2D Echo, Cardiac  Doppler and Color Doppler Indications:    Stroke 163.9  History:        Patient has prior history of Echocardiogram examinations, most                 recent 09/01/2021. Stroke, Arrythmias:Tachycardia; Risk                 Factors:Dyslipidemia and Hypertension.  Sonographer:    Ronny Flurry Sonographer#2:  Meagan Baucom RDCS, FE, PE Referring Phys: Barb Merino IMPRESSIONS  1. Left ventricular ejection fraction, by estimation, is 60 to 65%. The left ventricle has normal function. The left ventricle has no regional wall motion abnormalities. There is mild concentric left ventricular hypertrophy. Left ventricular diastolic parameters are consistent with Grade I diastolic dysfunction (impaired relaxation).  2. Right ventricular systolic function was not well visualized. The right ventricular size is normal. There is normal pulmonary artery systolic pressure. The estimated right ventricular systolic pressure is 26.2 mmHg.  3. The mitral valve is normal in structure. No evidence of mitral valve regurgitation. No evidence of mitral stenosis.  4. The  aortic valve is tricuspid. Aortic valve regurgitation is not visualized. Aortic valve sclerosis/calcification is present, without any evidence of aortic stenosis.  5. The inferior vena cava is normal in size with greater than 50% respiratory variability, suggesting right atrial pressure of 3 mmHg. FINDINGS  Left Ventricle: Left ventricular ejection fraction, by estimation, is 60 to 65%. The left ventricle has normal function. The left ventricle has no regional wall motion abnormalities. The left ventricular internal cavity size was normal in size. There is  mild concentric left ventricular hypertrophy. Left ventricular diastolic parameters are consistent with Grade I diastolic dysfunction (impaired relaxation). Right Ventricle: The right ventricular size is normal. No increase in right ventricular wall thickness. Right ventricular systolic function was not well visualized. There is normal pulmonary artery systolic pressure. The tricuspid regurgitant velocity is  2.10 m/s, and with an assumed right atrial pressure of 3 mmHg, the estimated right ventricular systolic pressure is 03.5 mmHg. Left Atrium: Left atrial size was normal in size. Right Atrium: Right atrial size was normal in size. Pericardium: There is no evidence of pericardial effusion. Mitral Valve: The mitral valve is normal in structure. Mild mitral annular calcification. No evidence of mitral valve regurgitation. No evidence of mitral valve stenosis. Tricuspid Valve: The tricuspid valve is normal in structure. Tricuspid valve regurgitation is mild . No evidence of tricuspid stenosis. Aortic Valve: The aortic valve is tricuspid. Aortic valve regurgitation is not visualized. Aortic valve sclerosis/calcification is present, without any evidence of aortic stenosis. Pulmonic Valve: The pulmonic valve was normal in structure. Pulmonic valve regurgitation is not visualized. No evidence of pulmonic stenosis. Aorta: The aortic root is normal in size and  structure. Venous: The inferior vena cava is normal in size with greater than 50% respiratory variability, suggesting right atrial pressure of 3 mmHg. IAS/Shunts: No atrial level shunt detected by color flow Doppler.  LEFT VENTRICLE PLAX 2D LVIDd:         4.30 cm LVIDs:         3.50 cm LV PW:         1.20 cm LV IVS:        1.10 cm LVOT diam:     2.00 cm LV SV:         50 LV SV Index:   26 LVOT Area:     3.14 cm  LEFT ATRIUM  Index        RIGHT ATRIUM          Index LA Vol (A2C):   48.1 ml 24.92 ml/m  RA Area:     8.21 cm LA Vol (A4C):   23.1 ml 11.97 ml/m  RA Volume:   15.00 ml 7.77 ml/m LA Biplane Vol: 36.0 ml 18.65 ml/m  AORTIC VALVE LVOT Vmax:   76.90 cm/s LVOT Vmean:  49.400 cm/s LVOT VTI:    0.159 m  AORTA Ao Root diam: 3.30 cm Ao Asc diam:  3.30 cm MITRAL VALVE                TRICUSPID VALVE MV Area (PHT): 3.91 cm     TV Peak grad:   13.6 mmHg MV Decel Time: 194 msec     TV Mean grad:   14.0 mmHg MV E velocity: 55.90 cm/s   TV Vmax:        1.84 m/s MV A velocity: 101.00 cm/s  TV Vmean:       176.0 cm/s MV E/A ratio:  0.55         TV VTI:         0.65 msec                             TR Peak grad:   17.6 mmHg                             TR Vmax:        210.00 cm/s                              SHUNTS                             Systemic VTI:  0.16 m                             Systemic Diam: 2.00 cm Fransico Him MD Electronically signed by Fransico Him MD Signature Date/Time: 11/09/2021/12:27:15 PM    Final    VAS [place] LOWER EXTREMITY VENOUS (DVT)  Result Date: 11/08/2021  Lower Venous DVT Study Patient Name:  NICOLA HEINEMANN  Date of Exam:   11/08/2021 Medical Rec #: [id number]       Accession #:    [id number] Date of Birth: 1945-07-15        Patient Gender: F Patient Age:   46 years Exam Location:  [facility] Procedure:      VAS [place] LOWER EXTREMITY VENOUS (DVT) Referring Phys: Cornelius Moras Demetre Monaco --------------------------------------------------------------------------------  Indications: Stroke.   Comparison Study: No previous Performing Technologist: Jody Hill RVT, RDMS  Examination Guidelines: A complete evaluation includes B-mode imaging, spectral Doppler, color Doppler, and power Doppler as needed of all accessible portions of each vessel. Bilateral testing is considered an integral part of a complete examination. Limited examinations for reoccurring indications may be performed as noted. The reflux portion of the exam is performed with the patient in reverse Trendelenburg.  +---------+---------------+---------+-----------+----------+--------------+ RIGHT    CompressibilityPhasicitySpontaneityPropertiesThrombus Aging +---------+---------------+---------+-----------+----------+--------------+ CFV      Full           Yes      Yes                                 +---------+---------------+---------+-----------+----------+--------------+  SFJ      Full                                                        +---------+---------------+---------+-----------+----------+--------------+ FV Prox  Full           Yes      Yes                                 +---------+---------------+---------+-----------+----------+--------------+ FV Mid   Full           Yes      Yes                                 +---------+---------------+---------+-----------+----------+--------------+ FV DistalFull           Yes      Yes                                 +---------+---------------+---------+-----------+----------+--------------+ PFV      Full                                                        +---------+---------------+---------+-----------+----------+--------------+ POP      Full           Yes      Yes                                 +---------+---------------+---------+-----------+----------+--------------+ PTV      Full                                                        +---------+---------------+---------+-----------+----------+--------------+ PERO      Full                                                        +---------+---------------+---------+-----------+----------+--------------+   +---------+---------------+---------+-----------+----------+--------------+ LEFT     CompressibilityPhasicitySpontaneityPropertiesThrombus Aging +---------+---------------+---------+-----------+----------+--------------+ CFV      Full           Yes      Yes                                 +---------+---------------+---------+-----------+----------+--------------+ SFJ      Full                                                        +---------+---------------+---------+-----------+----------+--------------+  FV Prox  Full           Yes      Yes                                 +---------+---------------+---------+-----------+----------+--------------+ FV Mid   Full           Yes      Yes                                 +---------+---------------+---------+-----------+----------+--------------+ FV DistalFull           Yes      Yes                                 +---------+---------------+---------+-----------+----------+--------------+ PFV      Full                                                        +---------+---------------+---------+-----------+----------+--------------+ POP      Full           Yes      Yes                                 +---------+---------------+---------+-----------+----------+--------------+ PTV      Full                                                        +---------+---------------+---------+-----------+----------+--------------+ PERO     Full                                                        +---------+---------------+---------+-----------+----------+--------------+     Summary: BILATERAL: - No evidence of deep vein thrombosis seen in the lower extremities, bilaterally. -No evidence of popliteal cyst, bilaterally.   *See table(s) above for measurements and observations.  Electronically signed by Servando Snare MD on 11/08/2021 at 2:59:24 PM.    Final    VAS US CAROTID  Result Date: 11/08/2021 Carotid Arterial Duplex Study Patient Name:  BRITTINEE RISK  Date of Exam:   11/08/2021 Medical Rec #: [id number]       Accession #:    [id number] Date of Birth: 1945-08-30        Patient Gender: F Patient Age:   61 years Exam Location[facility]  [facility] Procedure:      VAS US CAROTID Referring Phys: Rosalin Hawking --------------------------------------------------------------------------------  Indications:       CVA. Risk Factors:      Hypertension, hyperlipidemia, Diabetes, past history of                    smoking, prior CVA. Comparison Study:  No previous exams Performing Technologist: Jody Hill RVT, RDMS  Examination Guidelines: A complete evaluation  includes B-mode imaging, spectral Doppler, color Doppler, and power Doppler as needed of all accessible portions of each vessel. Bilateral testing is considered an integral part of a complete examination. Limited examinations for reoccurring indications may be performed as noted.  Right Carotid Findings: +----------+--------+--------+--------+------------------+--------+           PSV cm/sEDV cm/sStenosisPlaque DescriptionComments +----------+--------+--------+--------+------------------+--------+ CCA Prox  58      13                                         +----------+--------+--------+--------+------------------+--------+ CCA Distal68      10                                         +----------+--------+--------+--------+------------------+--------+ ICA Prox  42      12                                         +----------+--------+--------+--------+------------------+--------+ ICA Distal44      11                                         +----------+--------+--------+--------+------------------+--------+ ECA       102     7                                           +----------+--------+--------+--------+------------------+--------+ +----------+--------+-------+----------------+-------------------+           PSV cm/sEDV cmsDescribe        Arm Pressure (mmHG) +----------+--------+-------+----------------+-------------------+ [id number]             Multiphasic, WNL                    +----------+--------+-------+----------------+-------------------+ +---------+--------+--+--------+--+---------+ VertebralPSV cm/s50EDV cm/s19Antegrade +---------+--------+--+--------+--+---------+  Left Carotid Findings: +----------+--------+--------+--------+------------------+------------------+           PSV cm/sEDV cm/sStenosisPlaque DescriptionComments           +----------+--------+--------+--------+------------------+------------------+ CCA Prox  65      12                                                   +----------+--------+--------+--------+------------------+------------------+ CCA Distal77      13                                intimal thickening +----------+--------+--------+--------+------------------+------------------+ ICA Prox  51      14                                                   +----------+--------+--------+--------+------------------+------------------+ ICA Distal42      13                                                   +----------+--------+--------+--------+------------------+------------------+  ECA       102     0                                 intimal thickening +----------+--------+--------+--------+------------------+------------------+ +----------+--------+--------+----------------+-------------------+           PSV cm/sEDV cm/sDescribe        Arm Pressure (mmHG) +----------+--------+--------+----------------+-------------------+ [id number]              Multiphasic, WNL                    +----------+--------+--------+----------------+-------------------+  +---------+--------+--+--------+-+---------+ VertebralPSV cm/s35EDV cm/s8Antegrade +---------+--------+--+--------+-+---------+   Summary: Right Carotid: The extracranial vessels were near-normal with only minimal wall                thickening or plaque. Left Carotid: The extracranial vessels were near-normal with only minimal wall               thickening or plaque. Vertebrals:  Bilateral vertebral arteries demonstrate antegrade flow. Subclavians: Normal flow hemodynamics were seen in bilateral subclavian              arteries. *See table(s) above for measurements and observations.  Electronically signed by Servando Snare MD on 11/08/2021 at 2:54:23 PM.    Final    MR Brain W and Wo Contrast  Result Date: 11/07/2021 CLINICAL DATA:  Neuro deficit, acute, stroke suspected; double vision EXAM: MRI HEAD WITHOUT AND WITH CONTRAST MRA HEAD WITHOUT CONTRAST TECHNIQUE: Multiplanar, multi-echo pulse sequences of the brain and surrounding structures were acquired without and with intravenous contrast. Angiographic images of the Circle of Willis were acquired using MRA technique without intravenous contrast. CONTRAST:  9.64m GADAVIST GADOBUTROL 1 MMOL/ML IV SOLN COMPARISON:  Sep 13, 2021 FINDINGS: MRI HEAD FINDINGS Brain: A few small foci of variably reduced diffusion are identified including left dorsal pons right paramedian midbrain tegmentum, and bilateral frontal white matter. Foci on the prior study have resolved. There is a small focus of enhancement in the white matter at adjacent to the left frontal horn (series 22, image 28). Very faint corresponding diffusion hyperintensity. This probably represents a subacute infarct. Additional patchy and confluent areas of T2 hyperintensity in the supratentorial and pontine white matter are nonspecific but probably reflect stable moderate to marked chronic microvascular ischemic changes. Chronic small vessel infarcts present including pons, thalamus, and central white  matter. Scattered foci of susceptibility are again identified likely reflecting chronic microhemorrhages with distribution suggesting hypertension as the underlying etiology. No mass or mass effect.  No hydrocephalus or extra-axial collection. Vascular: Major vessel flow voids at the skull base are preserved. Skull and upper cervical spine: Normal marrow signal is preserved. Sinuses/Orbits: Paranasal sinuses are aerated. Orbits are unremarkable. Other: Sella is unremarkable.  Mastoid air cells are clear. MRA HEAD FINDINGS Anterior circulation: Intracranial internal carotid arteries are patent with atherosclerotic irregularity. There is stenosis in the right paraclinoid and left paraclinoid and supraclinoid portions consistent with findings on recent CTA. Anterior and middle cerebral arteries are patent with atherosclerotic irregularity bilateral distal ACA stenoses are identified as seen on prior CTA. Posterior circulation: Intracranial vertebral arteries are partially included but patent. Basilar artery is patent. Posterior cerebral arteries are patent with atherosclerotic irregularity. Right posterior communicating artery is present and is the primary supply of the right PCA. Other: No aneurysm. IMPRESSION: Small acute and subacute infarcts. Most relevant to the reported diplopia, there is involvement of the right  periaqueductal gray matter. Moderate to marked chronic microvascular ischemic changes with chronic small vessel infarcts. No proximal intracranial vessel occlusion. Atherosclerosis as seen on recent CTA. Electronically Signed   By: Macy Mis M.D.   On: 11/07/2021 19:40   MR ANGIO HEAD WO CONTRAST  Result Date: 11/07/2021 CLINICAL DATA:  Neuro deficit, acute, stroke suspected; double vision EXAM: MRI HEAD WITHOUT AND WITH CONTRAST MRA HEAD WITHOUT CONTRAST TECHNIQUE: Multiplanar, multi-echo pulse sequences of the brain and surrounding structures were acquired without and with intravenous  contrast. Angiographic images of the Circle of Willis were acquired using MRA technique without intravenous contrast. CONTRAST:  9.24m GADAVIST GADOBUTROL 1 MMOL/ML IV SOLN COMPARISON:  Sep 13, 2021 FINDINGS: MRI HEAD FINDINGS Brain: A few small foci of variably reduced diffusion are identified including left dorsal pons right paramedian midbrain tegmentum, and bilateral frontal white matter. Foci on the prior study have resolved. There is a small focus of enhancement in the white matter at adjacent to the left frontal horn (series 22, image 28). Very faint corresponding diffusion hyperintensity. This probably represents a subacute infarct. Additional patchy and confluent areas of T2 hyperintensity in the supratentorial and pontine white matter are nonspecific but probably reflect stable moderate to marked chronic microvascular ischemic changes. Chronic small vessel infarcts present including pons, thalamus, and central white matter. Scattered foci of susceptibility are again identified likely reflecting chronic microhemorrhages with distribution suggesting hypertension as the underlying etiology. No mass or mass effect.  No hydrocephalus or extra-axial collection. Vascular: Major vessel flow voids at the skull base are preserved. Skull and upper cervical spine: Normal marrow signal is preserved. Sinuses/Orbits: Paranasal sinuses are aerated. Orbits are unremarkable. Other: Sella is unremarkable.  Mastoid air cells are clear. MRA HEAD FINDINGS Anterior circulation: Intracranial internal carotid arteries are patent with atherosclerotic irregularity. There is stenosis in the right paraclinoid and left paraclinoid and supraclinoid portions consistent with findings on recent CTA. Anterior and middle cerebral arteries are patent with atherosclerotic irregularity bilateral distal ACA stenoses are identified as seen on prior CTA. Posterior circulation: Intracranial vertebral arteries are partially included but patent.  Basilar artery is patent. Posterior cerebral arteries are patent with atherosclerotic irregularity. Right posterior communicating artery is present and is the primary supply of the right PCA. Other: No aneurysm. IMPRESSION: Small acute and subacute infarcts. Most relevant to the reported diplopia, there is involvement of the right periaqueductal gray matter. Moderate to marked chronic microvascular ischemic changes with chronic small vessel infarcts. No proximal intracranial vessel occlusion. Atherosclerosis as seen on recent CTA. Electronically Signed   By: PMacy MisM.D.   On: 11/07/2021 19:40     PHYSICAL EXAM  Temp:  [97.4 F (36.3 C)-98.3 F (36.8 C)] 97.4 F (36.3 C) (07/12 1150) Pulse Rate:  [88-98] 88 (07/12 1150) Resp:  [16-18] 16 (07/12 1150) BP: (110-133)/(70-77) 133/71 (07/12 1150) SpO2:  [96 %-100 %] 96 % (07/12 1150)  General - Well nourished, well developed, in no apparent distress.  Ophthalmologic - fundi not visualized due to noncooperation.  Cardiovascular - Regular rhythm and rate.  Mental Status -  Level of arousal and orientation to time, place, and person were intact. Language including expression, naming, repetition, comprehension was assessed and found intact. Fund of Knowledge was assessed and was intact.  Cranial Nerves II - XII - II - Visual field intact OU. III, IV, VI - Extraocular movements exam showed right INO, improving V - Facial sensation intact bilaterally. VII - Facial movement intact bilaterally. VIII -  Hearing & vestibular intact bilaterally. X - Palate elevates symmetrically. XI - Chin turning & shoulder shrug intact bilaterally. XII - Tongue protrusion intact.  Motor Strength - The patient's strength was normal in all extremities and pronator drift was absent except left shoulder mildly decreased ROM, chronic.  Bulk was normal and fasciculations were absent.   Motor Tone - Muscle tone was assessed at the neck and appendages and was  normal.  Reflexes - The patient's reflexes were symmetrical in all extremities and she had no pathological reflexes.  Sensory - Light touch, temperature/pinprick were assessed and were symmetrical.    Coordination - The patient had normal movements in the hands with no ataxia or dysmetria.  Tremor was absent.  Gait and Station - deferred.   ASSESSMENT/PLAN Ms. Heather Gross is a 76 y.o. female with history of diabetes, hypertension, hyperlipidemia, CKD admitted for double vision. No tPA given due to outside window.    Stroke: Multifocal infarct, embolic, concerning for occult A-fib MRI left dorsal pontine small infarct, right paramedian midbrain infarct, left temporal and bilateral frontal small infarcts. MRA bilateral siphon, right M2 atherosclerosis Carotid Doppler unremarkable 2D Echo EF 60 to 65% LE venous Doppler negative for DVT EP recommend cardiac event monitoring given overnight several runs of atrial tachycardia. LDL 28 HgbA1c 7.0 Lovenox for VTE prophylaxis clopidogrel 75 mg daily prior to admission, now on aspirin 81 mg daily and Brilinta (ticagrelor) 90 mg bid for 30 days and then Plavix alone. Patient counseled to be compliant with her antithrombotic medications Ongoing aggressive stroke risk factor management Therapy recommendations: Home health PT and outpatient OT Disposition: Pending  History of stroke 09/01/2021 admitted for falling and difficulty speaking.  MRI showed right frontoparietal white matter small infarct.  EF 55 to 60%, LDL 60, A1c 6.5, CTA head and neck showed bilateral siphon, A3 and the right M2 stenosis.  Discharged on DAPT and Crestor 40 09/14/2021 admitted again for confusion, speech difficulty.  CT PE negative.  MRI showed subacute left thalamic infarct.  CTA head and neck unchanged.  Discharged on aspirin and Brilinta.  Runs of atrial tachycardia Overnight telemetry recorded several runs of atrial tachycardia High likelihood of potential  A-fib EP notified, recommend cardiac event monitoring and if negative, consider loop recorder.  Diabetes HgbA1c 7.0 goal < 7.0 Uncontrolled CBG monitoring SSI DM education and close PCP follow up  Hypertension Stable Avoid low BP Long term BP goal normotensive  Hyperlipidemia Home meds: Crestor 40 LDL 28, goal < 70 Now on Crestor 40 Continue statin at discharge  Other Stroke Risk Factors Advanced age  Other Active Problems CKD 3A, creatinine 1.8  Hospital day # 0  Neurology will sign off. Please call with questions. Pt will follow up with Megan NP at [facility] in about 4 weeks. Thanks for the consult.   Rosalin Hawking, MD PhD Stroke Neurology 11/09/2021 5:49 PM    To contact Stroke Continuity provider, please refer to [email or web address] After hours, contact General Neurology

## 2021-11-09 NOTE — Progress Notes (Signed)
Discharge instructions, RX's and follow up appts explained and provided to patient and c/g verbalized understanding . Patient left floor via wheelchair accompanied by staff. No c/o pain or shortness of breath.  Jocelin Schuelke, Tivis Ringer, RN

## 2021-11-09 NOTE — TOC Transition Note (Signed)
Transition of Care [facility]) - CM/SW Discharge Note   Patient Details  Name: Heather Gross MRN: [id number] Date of Birth: Sep 22, 1945  Transition of Care [facility]) CM/SW Contact:  Pollie Friar, RN Phone Number: 11/09/2021, 12:04 PM   Clinical Narrative:    Pt is discharging home with outpatient therapy through [facility]. Orders in Epic and information on the AVS.  Pt uses cane at home and has a walker.  She manages her own medications at home.  Spouse does the driving.  Brilinta being filled through [facility] and should not have a co pay.  Pt has transportation home.    Final next level of care: OP Rehab Barriers to Discharge: No Barriers Identified   Patient Goals and CMS Choice     Choice offered to / list presented to : Patient  Discharge Placement                       Discharge Plan and Services                                     Social Determinants of Health (SDOH) Interventions     Readmission Risk Interventions     No data to display

## 2021-11-09 NOTE — Progress Notes (Signed)
Occupational Therapy Treatment Patient Details Name: Heather Gross MRN: [id number] DOB: [birth year] Today's Date: 11/09/2021   History of present illness 76 yo female presenting to ED on 7/10 with diplopia. Recently came to ED on 7/8 for diplopia, MRI was ordered but wait was too long so pt left. 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 PMH including HTN, HLD, and prior strokes.   OT comments  Pt greeted supine in bed, session focus on further visual assessment and reeducation on previous diplopia strategies. Able to remove small portion of tape on occlusion glasses with pt reporting double vision had improved, pt able to track in all quadrants ( slower on R side but [facility] ) and complete visual perception task of stacking cups with no difficulties noted, reviewed various other visual perception tasks to be carried over at home with pt and family member. Pt would continue to benefit from skilled occupational therapy while admitted and after d/c to address the below listed limitations in order to improve overall functional mobility and facilitate independence with BADL participation. DC plan remains appropriate, will follow acutely per POC.     Recommendations for follow up therapy are one component of a multi-disciplinary discharge planning process, led by the attending physician.  Recommendations may be updated based on patient status, additional functional criteria and insurance authorization.    Follow Up Recommendations  Outpatient OT    Assistance Recommended at Discharge PRN  Patient can return home with the following      Equipment Recommendations  None recommended by OT    Recommendations for Other Services      Precautions / Restrictions Precautions Precautions: Fall Restrictions Weight Bearing Restrictions: No       Mobility Bed Mobility               General bed mobility comments: pt remained in bed during session     Transfers                         Balance                                           ADL either performed or assessed with clinical judgement   ADL                                         General ADL Comments: session focus on continued visual assessment    Extremity/Trunk Assessment Upper Extremity Assessment Upper Extremity Assessment: Generalized weakness   Lower Extremity Assessment Lower Extremity Assessment: Defer to PT evaluation        Vision Baseline Vision/History: 1 Wears glasses Patient Visual Report: Other (comment) (improved diplopia from previous session, able to track in all quads with no difficulty. able to remove small portion of tape from with no changes noted) Vision Assessment?: Yes Tracking/Visual Pursuits: Able to track stimulus in all quads without difficulty Additional Comments: reviewed diplopia handout with pt and family   Perception Perception Perception: Within Functional Limits (+ time to complete perception task)   Praxis Praxis Praxis: Intact (+ time to complete task)    Cognition Arousal/Alertness: Awake/alert Behavior During Therapy: WFL for tasks assessed/performed Overall Cognitive Status: Within Functional Limits for tasks assessed  Exercises Other Exercises Other Exercises: Providing cup stacking exercises; pt able to recall from yesterday Other Exercises: reviewed diplopia handout from yesterday with good carryover    Shoulder Instructions       General Comments family member present during session    Pertinent Vitals/ Pain       Pain Assessment Pain Assessment: No/denies pain  Home Living                                          Prior Functioning/Environment              Frequency  Min 3X/week        Progress Toward Goals  OT Goals(current goals can now be found in the care plan  section)  Progress towards OT goals: Progressing toward goals  Acute Rehab OT Goals Patient Stated Goal: to go home OT Goal Formulation: With patient Time For Goal Achievement: 11/22/21 Potential to Achieve Goals: Good  Plan Discharge plan remains appropriate;Frequency remains appropriate    Co-evaluation                 AM-PAC OT "6 Clicks" Daily Activity     Outcome Measure   Help from another person eating meals?: None Help from another person taking care of personal grooming?: A Little Help from another person toileting, which includes using toliet, bedpan, or urinal?: A Little Help from another person bathing (including washing, rinsing, drying)?: A Little Help from another person to put on and taking off regular upper body clothing?: A Little Help from another person to put on and taking off regular lower body clothing?: A Little 6 Click Score: 19    End of Session    OT Visit Diagnosis: Low vision, both eyes (H54.2);Muscle weakness (generalized) (M62.81)   Activity Tolerance Patient tolerated treatment well   Patient Left in bed;with call bell/phone within reach;with family/visitor present   Nurse Communication          Time: 2637-8588 OT Time Calculation (min): 15 min  Charges: OT General Charges $OT Visit: 1 Visit OT Treatments $Therapeutic Exercise: 8-22 mins  Heather Gross., COTA/L Acute Rehabilitation Services [phone]   Heather Gross 11/09/2021, 12:15 PM

## 2021-11-09 NOTE — Care Management Obs Status (Signed)
[facility] NOTIFICATION   Patient Details  Name: Heather Gross MRN: [id number] Date of Birth: October 28, 1945   Medicare Observation Status Notification Given:  Yes    Pollie Friar, RN 11/09/2021, 11:51 AM

## 2021-11-15 ENCOUNTER — Encounter: Payer: Self-pay | Admitting: Pharmacist

## 2021-11-15 ENCOUNTER — Ambulatory Visit: Payer: Medicare HMO | Admitting: Pharmacist

## 2021-11-15 VITALS — BP 102/68 | HR 91 | Ht 65.0 in | Wt 184.6 lb

## 2021-11-15 DIAGNOSIS — I10 Essential (primary) hypertension: Secondary | ICD-10-CM | POA: Diagnosis not present

## 2021-11-15 NOTE — Progress Notes (Unsigned)
Patient ID: Heather Gross                 DOB: 10-05-1945                      MRN: [id number]     HPI: Heather Gross is a 76 y.o. female referred by Dr. Oval Linsey to HTN clinic. PMH is significant forhypertension, hyperlipidemia, CKD 3, LBBB, obesity, and recent stroke.  Patient discharged on 7/12 for recurrent stroke. Zio patch ordered.  Patient presents today with husband who is trying to to organize her medications. Brought medication bottles which include both Brilinta and Plavix. Did not have aspirin with her.  Has not been checking blood pressure at home. GFR low at 33. Walks with a cane. Denies chest pain or swelling but is unsteady on her feet. Having issues with double vision but has upcoming neuro appointments scheduled.  Has been losing weight.  At last visit with Dr Oval Linsey patient expressed confusion with medication regimen so patient was placed on olmesartan/amlodipine/HCTZ to help with compliance.  Current HTN meds:  Olmesartan/Amlodipine/HCTZ 40/5/25 mg daily  Previously tried:  Doxazosin HCTZ Lisinopril  BP goal: <130/80  Wt Readings from Last 3 Encounters:  10/18/21 188 lb 12.8 oz (85.6 kg)  09/28/21 192 lb 1.6 oz (87.1 kg)  09/14/21 207 lb 3.7 oz (94 kg)   BP Readings from Last 3 Encounters:  11/09/21 133/71  11/06/21 133/75  10/18/21 104/65   Pulse Readings from Last 3 Encounters:  11/09/21 88  11/06/21 72  10/18/21 (!) 104    Renal function: CrCl cannot be calculated (Unknown ideal weight.).  Past Medical History:  Diagnosis Date   Glaucoma    Hyperlipidemia    Hypertension    Stroke [facility])     Current Outpatient Medications on File Prior to Visit  Medication Sig Dispense Refill   aspirin EC 81 MG tablet Take 1 tablet (81 mg total) by mouth daily. Swallow whole. 30 tablet 0   Calcium 500 MG CHEW Chew 2 tablets (1,000 mg total) by mouth daily. 30 tablet 0   [START ON 12/11/2021] clopidogrel (PLAVIX) 75 MG tablet Take 1 tablet (75 mg total) by  mouth daily. Start on December 11, 2021 30 tablet 11   [employer] D3 2000 units CAPS TAKE 1 CAPSULE BY MOUTH EVERY DAY (Patient taking differently: Take 2,000 Units by mouth daily after breakfast.) 100 capsule 2   ezetimibe (ZETIA) 10 MG tablet Take 10 mg by mouth daily.     fluticasone (FLONASE) 50 MCG/ACT nasal spray Place 1-2 sprays into both nostrils daily as needed for allergies or rhinitis.     metFORMIN (GLUCOPHAGE) 500 MG tablet TAKE 1 TABLET BY MOUTH AT BEDTIME. (Patient taking differently: Take 500 mg by mouth daily with breakfast.) 30 tablet 0   Multiple Vitamin (MULTIVITAMIN) tablet Take 1 tablet by mouth daily with breakfast.     Olmesartan-amLODIPine-HCTZ 40-5-25 MG TABS Take 1 tablet by mouth daily. 90 tablet 3   pantoprazole (PROTONIX) 20 MG tablet Take 1 tablet (20 mg total) by mouth daily. (Patient taking differently: Take 20 mg by mouth daily as needed for indigestion.) 30 tablet 3   rosuvastatin (CRESTOR) 40 MG tablet Take 40 mg by mouth at bedtime.     ticagrelor (BRILINTA) 90 MG TABS tablet Take 1 tablet (90 mg total) by mouth 2 (two) times daily. 60 tablet 0   No current facility-administered medications on file prior to visit.    Allergies  Allergen Reactions   Lipitor [Atorvastatin] Other (See Comments)    Made mouth very dry and the lips discolored   Shellfish-Derived Products Other (See Comments)    Hands break out   Latex Rash     Assessment/Plan:  1. Hypertension -  Patient BP in room 102/68 which is at goal of <130/80. Advised patient should not be taking Plavix and Brilinta together. Per ED discharge, patient was supposed to begin Plavix once Brilinta was completed. Husband will remove Plavix from pill box. Gave samples of aspirin while in room and husband appreciative.  No other medication changes needed at this time.  Continue Olmesartan/Amlodipine/HCTZ 40/5/25 mg daily Stop Plavix at this time until Brilinta runs out Start Asprin '81mg'$  Recheck as  needed.  Karren Cobble, PharmD, BCACP, [place], [place], [place], [place], [address] Phone: [phone], Fax: [phone]

## 2021-11-15 NOTE — Patient Instructions (Addendum)
It was nice meeting you two today  We would like to keep your blood pressure less than 130/80  Please continue your Olmesartan/Amlodipine/Hydrochlorothiazide tablet every morning  Please remove your Clopidogrel '75mg'$  from your pill organizer. Restart it on 8/13.    Add a baby aspirin to your pill organizer daily  Please remember to watch your diet, reduce salt and fried foods, and remember to take your medications  Please call with any questions  Karren Cobble, PharmD, [place], [place], [place], [place], [place], [address] Phone: [phone], Fax: [phone]

## 2021-11-17 ENCOUNTER — Telehealth: Payer: Self-pay

## 2021-11-17 NOTE — Telephone Encounter (Signed)
Left message requesting call back to schedule intake for PREP class at [facility]

## 2021-11-28 NOTE — Telephone Encounter (Signed)
This encounter was created in error - please disregard.

## 2021-12-06 ENCOUNTER — Encounter: Payer: Self-pay | Admitting: Physical Therapy

## 2021-12-06 ENCOUNTER — Ambulatory Visit: Payer: Medicare HMO | Attending: Family Medicine | Admitting: Physical Therapy

## 2021-12-06 ENCOUNTER — Ambulatory Visit: Payer: Medicare HMO | Admitting: Occupational Therapy

## 2021-12-06 ENCOUNTER — Ambulatory Visit: Payer: Medicare HMO | Admitting: Speech Pathology

## 2021-12-06 VITALS — BP 160/80 | HR 91

## 2021-12-06 DIAGNOSIS — R2689 Other abnormalities of gait and mobility: Secondary | ICD-10-CM | POA: Insufficient documentation

## 2021-12-06 DIAGNOSIS — R41844 Frontal lobe and executive function deficit: Secondary | ICD-10-CM

## 2021-12-06 DIAGNOSIS — R41842 Visuospatial deficit: Secondary | ICD-10-CM | POA: Insufficient documentation

## 2021-12-06 DIAGNOSIS — R2681 Unsteadiness on feet: Secondary | ICD-10-CM

## 2021-12-06 DIAGNOSIS — I639 Cerebral infarction, unspecified: Secondary | ICD-10-CM | POA: Diagnosis not present

## 2021-12-06 DIAGNOSIS — R278 Other lack of coordination: Secondary | ICD-10-CM

## 2021-12-06 DIAGNOSIS — R41841 Cognitive communication deficit: Secondary | ICD-10-CM | POA: Diagnosis present

## 2021-12-06 DIAGNOSIS — M6281 Muscle weakness (generalized): Secondary | ICD-10-CM | POA: Insufficient documentation

## 2021-12-06 DIAGNOSIS — R41840 Attention and concentration deficit: Secondary | ICD-10-CM | POA: Insufficient documentation

## 2021-12-06 NOTE — Therapy (Signed)
OUTPATIENT PHYSICAL THERAPY NEURO EVALUATION   Patient Name: Heather Gross MRN: [id number] DOB:02/13/1946, 76 y.o., female Today's Date: 12/06/2021   PCP: [name], NP REFERRING PROVIDER: Flora Lipps, MD    PT End of Session - 12/06/21 1147     Visit Number 1    Number of Visits 7   with eval   Date for PT Re-Evaluation 01/17/22    Authorization Type [employer]    PT Start Time 1607    PT Stop Time 1215   evaluation   PT Time Calculation (min) 30 min    Activity Tolerance Patient tolerated treatment well    Behavior During Therapy [facility] for tasks assessed/performed             Past Medical History:  Diagnosis Date   Glaucoma    Hyperlipidemia    Hypertension    Stroke [facility])    Past Surgical History:  Procedure Laterality Date   BREAST BIOPSY Right 09/29/2015   BREAST EXCISIONAL BIOPSY Right 11/04/2015   papilloma   BREAST LUMPECTOMY WITH RADIOACTIVE SEED LOCALIZATION Right 11/04/2015   Procedure: BREAST LUMPECTOMY WITH RADIOACTIVE SEED LOCALIZATION;  Surgeon: Autumn Messing III, MD;  Location: [place];  Service: General;  Laterality: Right;   LIPOMA EXCISION     Patient Active Problem List   Diagnosis Date Noted   Family history of malignant neoplasm of digestive organs 10/28/2021   Cerebrovascular disease 09/13/2021   Acute ischemic stroke ([place]) 09/01/2021   LBBB (left bundle branch block) 09/01/2021   Obesity (BMI 30-39.9) 12/30/2019   Stage 3a chronic kidney disease (CKD) ([place]) -Baseline Scr 1.3-1.6 02/05/2017   Osteopenia 11/02/2016   Vitamin D deficiency 12/10/2014   Hyperlipidemia 06/11/2014   Type 2 diabetes mellitus with diabetic chronic kidney disease ([place])    Glaucoma    Essential hypertension 06/28/2006   Gastroesophageal reflux disease 06/28/2006   ECZEMA, ATOPIC DERMATITIS 06/28/2006    ONSET DATE: 11/09/2021   REFERRING DIAG: I63.9 (ICD-10-CM) - Acute ischemic stroke (HCC)   THERAPY DIAG:  Muscle weakness  (generalized)  Other abnormalities of gait and mobility  Rationale for Evaluation and Treatment Rehabilitation  SUBJECTIVE:                                                                                                                                                                                              SUBJECTIVE STATEMENT: Pt reports her balance has been off since the stroke and she wants to work on improving that. Pt states she used to go for walks a lot outdoors but doesn't feel comfortable doing that right now. Per  pt and husband she has been referred to a neuro-opthamologist for her vision impairments from the OT.  Pt accompanied by: self and significant other husband [name]  PERTINENT HISTORY: hypertension, hyperlipidemia, CKD 3, LBBB, obesity, and recent stroke.   PAIN:  Are you having pain? No  PRECAUTIONS: Fall  WEIGHT BEARING RESTRICTIONS No  FALLS: Has patient fallen in last 6 months? No  LIVING ENVIRONMENT: Lives with: lives with their spouse Lives in: House/apartment Stairs: Yes: External: 2 steps; none Has following equipment at home: Single point cane and Walker - 2 wheeled  PLOF: Independent with gait, Independent with transfers, and Requires assistive device for independence, husband provides Supervision on stairs  PATIENT GOALS "to get stronger"  OBJECTIVE:   DIAGNOSTIC FINDINGS:   IMPRESSION: MRI of brain 11/07/2021 Small acute and subacute infarcts. Most relevant to the reported diplopia, there is involvement of the right periaqueductal gray matter.   Moderate to marked chronic microvascular ischemic changes with chronic small vessel infarcts.   No proximal intracranial vessel occlusion. Atherosclerosis as seen on recent CTA.  COGNITION: Overall cognitive status: Within functional limits for tasks assessed   SENSATION: WFL per pt report  COORDINATION: Over and undershooting with LUE WFL heel to shin bilaterally  POSTURE: rounded  shoulders and forward head   LOWER EXTREMITY MMT:    MMT Right Eval Left Eval  Hip flexion 5 5  Hip extension    Hip abduction    Hip adduction    Hip internal rotation    Hip external rotation    Knee flexion 5 5  Knee extension 5 5  Ankle dorsiflexion 5 5  Ankle plantarflexion    Ankle inversion    Ankle eversion    (Blank rows = not tested)  BED MOBILITY:  Independent per pt report  TRANSFERS: Assistive device utilized: Single point cane "hurricane" Sit to stand: Modified independence Stand to sit: Modified independence Chair to chair: Modified independence Floor:  not assessed this date  GAIT: Gait pattern:  L inattension and WFL Distance walked: 115 ft Assistive device utilized: Single point cane "hurricane" Level of assistance: SBA Comments: WFL overall, cues needed to attend to L visual field for obstacle avoidance  FUNCTIONAL TESTs:    [facility] PT Assessment - 12/06/21 1157       Ambulation/Gait   Gait velocity 32.8 ft over 14.5 sec = 2.26 ft/sec   with SPC and Supervision, path deviation to the L and L inattention     Standardized Balance Assessment   Standardized Balance Assessment Five Times Sit to Stand;Timed Up and Go Test;Berg Balance Test    Five times sit to stand comments  21 sec with BUE support on arms of chair, cues to continue test      [employer] Test   Sit to Stand Able to stand without using hands and stabilize independently    Standing Unsupported Able to stand safely 2 minutes    Sitting with Back Unsupported but Feet Supported on Floor or Stool Able to sit safely and securely 2 minutes    Stand to Sit Sits safely with minimal use of hands    Transfers Able to transfer safely, minor use of hands    Standing Unsupported with Eyes Closed Able to stand 10 seconds with supervision    Standing Unsupported with Feet Together Able to place feet together independently and stand for 1 minute with supervision    From Standing, Reach Forward  with Outstretched Arm Loses balance while trying/requires external  support    From Standing Position, Pick up Object from [place] to pick up shoe, needs supervision    From Standing Position, Turn to Look Behind Over each Shoulder Looks behind from both sides and weight shifts well    Turn 360 Degrees Needs close supervision or verbal cueing    Standing Unsupported, Alternately Place Feet on Step/Stool Able to complete >2 steps/needs minimal assist    Standing Unsupported, One Foot in [place] to take small step independently and hold 30 seconds    Standing on One Leg Tries to lift leg/unable to hold 3 seconds but remains standing independently    Total Score 38    Berg comment: 38/56, significant fall risk      Timed Up and Go Test   TUG Normal TUG    Normal TUG (seconds) 12.3   no AD and Supervision            Vitals:   12/06/21 1215  BP: (!) 160/80  Pulse: 91     TODAY'S TREATMENT:  PT evaluation  PATIENT EDUCATION: Education details: Eval findings, POC Person educated: Patient and Spouse Education method: Explanation Education comprehension: verbalized understanding   HOME EXERCISE PROGRAM: To be established next session   GOALS: Goals reviewed with patient? Yes  SHORT TERM GOALS: Target date: 12/27/2021  Initial HEP to be established Baseline: Goal status: INITIAL  2.  Pt will improve Berg score to 42/56 for decreased fall risk Baseline: 38/56 (8/8) Goal status: INITIAL  3.  Pt will improve gait velocity to at least 2.0 ft/sec for improved gait efficiency and performance at Supervision level  Baseline: 2.26 ft/sec (8/8) Goal status: INITIAL   LONG TERM GOALS: Target date: 01/17/2022  Pt will be independent with balance HEP for improved strength, balance, transfers and gait. Baseline:  Goal status: INITIAL  2.  Pt will improve Berg score to 46/56 for decreased fall risk Baseline: 38/56 (8/8) Goal status: INITIAL  3.  Pt will improve gait  velocity to at least 1.5 ft/sec for improved gait efficiency and performance at Supervision level   Baseline: 2.26 ft/sec (8/8) Goal status: INITIAL   ASSESSMENT:  CLINICAL IMPRESSION: Patient is a 76 year old female referred to Neuro OPPT for R CVA.   Pt's PMH is significant for: hypertension, hyperlipidemia, CKD 3, LBBB, obesity, and recent stroke.  The following deficits were present during the exam: impaired balance, L inattention, decreased gait speed, and decreased safety and independence with functional mobility. Based on patients score of 38/56 on the Berg (significant fall risk), gait speed of 2.26 ft/sec, and L inattention, pt is an increased risk for falls. Pt would benefit from skilled PT to address these impairments and functional limitations to maximize functional mobility independence.  OBJECTIVE IMPAIRMENTS Abnormal gait, decreased balance, decreased safety awareness, and impaired vision/preception.   ACTIVITY LIMITATIONS carrying, lifting, bending, squatting, stairs, and locomotion level  PARTICIPATION LIMITATIONS: meal prep, cleaning, laundry, interpersonal relationship, driving, shopping, and community activity  PERSONAL FACTORS Age and 1-2 comorbidities:   hypertension, hyperlipidemia, CKD 3, LBBB, obesity, and recent stroke. are also affecting patient's functional outcome.   REHAB POTENTIAL: Good  CLINICAL DECISION MAKING: Stable/uncomplicated  EVALUATION COMPLEXITY: Low  PLAN: PT FREQUENCY: 1x/week  PT DURATION: 6 weeks  PLANNED INTERVENTIONS: Therapeutic exercises, Therapeutic activity, Neuromuscular re-education, Balance training, Gait training, Patient/Family education, Self Care, Joint mobilization, Stair training, DME instructions, Cryotherapy, Moist heat, Manual therapy, and Re-evaluation  PLAN FOR NEXT SESSION: initiate HEP for balance (tandem,  SLS, etc.)   Excell Seltzer, PT, DPT, CSRS 12/06/2021, 12:32 PM

## 2021-12-06 NOTE — Therapy (Signed)
OUTPATIENT OCCUPATIONAL THERAPY NEURO EVALUATION  Patient Name: Heather Gross MRN: [id number] DOB:04-21-1946, 76 y.o., female Today's Date: 12/06/2021  PCP:  [name] NP REFERRING PROVIDER: Dr. Louanne Belton   OT End of Session - 12/06/21 1239     Visit Number 1    Number of Visits 12    Date for OT Re-Evaluation 02/28/22    Authorization Type [employer]    Authorization - Visit Number 1    Progress Note Due on Visit 10    OT Start Time 1018    OT Stop Time 1055    OT Time Calculation (min) 37 min    Behavior During Therapy [facility] for tasks assessed/performed             Past Medical History:  Diagnosis Date   Glaucoma    Hyperlipidemia    Hypertension    Stroke [facility])    Past Surgical History:  Procedure Laterality Date   BREAST BIOPSY Right 09/29/2015   BREAST EXCISIONAL BIOPSY Right 11/04/2015   papilloma   BREAST LUMPECTOMY WITH RADIOACTIVE SEED LOCALIZATION Right 11/04/2015   Procedure: BREAST LUMPECTOMY WITH RADIOACTIVE SEED LOCALIZATION;  Surgeon: Autumn Messing III, MD;  Location: [place];  Service: General;  Laterality: Right;   LIPOMA EXCISION     Patient Active Problem List   Diagnosis Date Noted   Family history of malignant neoplasm of digestive organs 10/28/2021   Cerebrovascular disease 09/13/2021   Acute ischemic stroke ([place]) 09/01/2021   LBBB (left bundle branch block) 09/01/2021   Obesity (BMI 30-39.9) 12/30/2019   Stage 3a chronic kidney disease (CKD) ([place]) -Baseline Scr 1.3-1.6 02/05/2017   Osteopenia 11/02/2016   Vitamin D deficiency 12/10/2014   Hyperlipidemia 06/11/2014   Type 2 diabetes mellitus with diabetic chronic kidney disease ([place])    Glaucoma    Essential hypertension 06/28/2006   Gastroesophageal reflux disease 06/28/2006   ECZEMA, ATOPIC DERMATITIS 06/28/2006    ONSET DATE: 11/07/21  REFERRING DIAG: CVA  THERAPY DIAG:  Visuospatial deficit - Plan: Ot plan of care cert/re-cert  Attention and concentration  deficit - Plan: Ot plan of care cert/re-cert  Frontal lobe and executive function deficit - Plan: Ot plan of care cert/re-cert  Unsteadiness on feet - Plan: Ot plan of care cert/re-cert  Other lack of coordination - Plan: Ot plan of care cert/re-cert  Other abnormalities of gait and mobility - Plan: Ot plan of care cert/re-cert  Rationale for Evaluation and Treatment Rehabilitation  SUBJECTIVE:   SUBJECTIVE STATEMENT: Pt reports she wants to get back to how she was before Pt accompanied by: husband  PERTINENT HISTORY: 76 yo female presenting to ED on 7/10 with diplopia. Recently came to ED on 7/8 for diplopia, MRI was ordered but wait was too long so pt left. 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 PRECAUTIONS: Fall  WEIGHT BEARING RESTRICTIONS No  PAIN:  Are you having pain? No  FALLS: Has patient fallen in last 6 months? No  LIVING ENVIRONMENT: Lives with: lives with their spouse Lives in: House/apartment Stairs: No Has following equipment at home: Single point cane  PLOF: Independent  PATIENT GOALS to get back to myself, to drive  OBJECTIVE:   HAND DOMINANCE: Right  ADLs:  Transfer/ambulation related to ADLs:mod I Eating: mod I Grooming: mod I UB Dressing: mod I LB Dressing: mod I Toileting: mod I with cane  Bathing: supervision recommended  Tub Shower transfers: supervision recommended    IADLs: Shopping: needs  assist Light housekeeping: washed dishes mod I , has not resumed home managment Meal Prep: Pt's husband is cooking currently Community mobility: supervision, with cane Medication management: needs asist Financial management: pt does finanaces with assist   MOBILITY STATUS:  supervision with cane  standing functional reach, RUE 12 inches, LUE 10 inches      UPPER EXTREMITY ROM   WFLS   UPPER EXTREMITY MMT:   bilateral proximal strength 4/5, distal 4+/5  HAND FUNCTION: Grip strength:  Right: 41.6 lbs; Left: 49.1 lbs  COORDINATION: 9 Hole Peg test: Right: 30.31 sec; Left: 36.90 sec decr. coordination in LUE  SENSATION: WFL     COGNITION: Overall cognitive status:  decreased short term memory, decreased attention , difficulty following multi step commands, decreased multi tasking  VISION: Subjective report: Pt reports diplopia with fatigue Baseline vision: Wears glasses all the time Visual history: glaucoma per pt report  VISION ASSESSMENT: Impaired Eye alignment: WFL Tracking/Visual pursuits: Able to track stimulus in all quads without difficulty Visual Fields: no apparent deficits  Patient has difficulty with following activities due to following visual impairments: reading, sometimes too blurry   OBSERVATIONS: Pt requires increased time and v.c for commands   TODAY'S TREATMENT:  N/A   PATIENT EDUCATION: N/A   HOME EXERCISE PROGRAM: N/A    GOALS:   SHORT TERM GOALS: Target date: 01/05/22   I with HEP for vision Baseline: Goal status: INITIAL  2.  I with HEP for LUE coordination Baseline:  Goal status: INITIAL  3.  Pt will verbalize understanding of compensatory strategies for short term memory/ cognitive deficits. Baseline:  Goal status: INITIAL  4.  Pt/ husband will verbalize understanding of AE recommendations for increased safety with shower transfers/ shower.(Ie: tub transfer bench) Baseline:  Goal status: INITIAL  5  LONG TERM GOALS: Target date: 02/28/22  Pt will perform basic cooking with supervision demonstrating good safety awareness. Baseline:  Goal status: INITIAL  2.  Pt will perform home management activities demonstrating good safety awareness. Baseline:  Goal status: INITIAL  3.  Pt report ability to consistently read newsprint without diplopia Baseline:  Goal status: INITIAL  ASSESSMENT:  CLINICAL IMPRESSION: Patient is a 76 y.o. female who was seen today for occupational therapy evaluation for CVA.  PMH including HTN, HLD, and prior strokes.   PERFORMANCE DEFICITS in functional skills including ADLs, IADLs, coordination, dexterity, FMC, mobility, balance, decreased knowledge of precautions, decreased knowledge of use of DME, vision, and UE functional use, cognitive skills including attention, learn, memory, problem solving, safety awareness, sequencing, thought, and understand, and psychosocial skills including coping strategies, environmental adaptation, habits, interpersonal interactions, and routines and behaviors.   IMPAIRMENTS are limiting patient from ADLs, IADLs, rest and sleep, play, leisure, and social participation.   COMORBIDITIES may have co-morbidities  that affects occupational performance. Patient will benefit from skilled OT to address above impairments and improve overall function.  MODIFICATION OR ASSISTANCE TO COMPLETE EVALUATION: Min-Moderate modification of tasks or assist with assess necessary to complete an evaluation.  OT OCCUPATIONAL PROFILE AND HISTORY: Detailed assessment: Review of records and additional review of physical, cognitive, psychosocial history related to current functional performance.  CLINICAL DECISION MAKING: LOW - limited treatment options, no task modification necessary  REHAB POTENTIAL: Good  EVALUATION COMPLEXITY: Low    PLAN: OT FREQUENCY: 1x/week  OT DURATION: 12 weeks (anticipate d/c after 6 weeks)  PLANNED INTERVENTIONS: self care/ADL training, therapeutic exercise, therapeutic activity, neuromuscular re-education, manual therapy, gait training, balance training, functional mobility training,  moist heat, cryotherapy, contrast bath, patient/family education, cognitive remediation/compensation, visual/perceptual remediation/compensation, coping strategies training, DME and/or AE instructions, and Re-evaluation  RECOMMENDED OTHER SERVICES: PT  CONSULTED AND AGREED WITH PLAN OF CARE: Patient and family member/caregiver  PLAN FOR NEXT  SESSION: vision exercises, education regarding tub bench, memory strategies.   Myliah Medel, OT 12/06/2021, 1:06 PM

## 2021-12-06 NOTE — Addendum Note (Signed)
Encounter addended by: Markus Daft A on: 12/06/2021 6:22 PM  Actions taken: Imaging Exam ended

## 2021-12-06 NOTE — Therapy (Signed)
OUTPATIENT SPEECH LANGUAGE PATHOLOGY EVALUATION   Patient Name: Heather Gross MRN: [id number] DOB:10-24-1945, 76 y.o., female Today's Date: 12/06/2021  PCP: [name], NP REFERRING PROVIDER: Flora Lipps, MD   End of Session - 12/06/21 1407     Visit Number 1    Number of Visits 1    SLP Start Time 1100    SLP Stop Time  1145    SLP Time Calculation (min) 45 min    Activity Tolerance Patient tolerated treatment well             Past Medical History:  Diagnosis Date   Glaucoma    Hyperlipidemia    Hypertension    Stroke [facility])    Past Surgical History:  Procedure Laterality Date   BREAST BIOPSY Right 09/29/2015   BREAST EXCISIONAL BIOPSY Right 11/04/2015   papilloma   BREAST LUMPECTOMY WITH RADIOACTIVE SEED LOCALIZATION Right 11/04/2015   Procedure: BREAST LUMPECTOMY WITH RADIOACTIVE SEED LOCALIZATION;  Surgeon: Autumn Messing III, MD;  Location: [place];  Service: General;  Laterality: Right;   LIPOMA EXCISION     Patient Active Problem List   Diagnosis Date Noted   Family history of malignant neoplasm of digestive organs 10/28/2021   Cerebrovascular disease 09/13/2021   Acute ischemic stroke ([place]) 09/01/2021   LBBB (left bundle branch block) 09/01/2021   Obesity (BMI 30-39.9) 12/30/2019   Stage 3a chronic kidney disease (CKD) ([place]) -Baseline Scr 1.3-1.6 02/05/2017   Osteopenia 11/02/2016   Vitamin D deficiency 12/10/2014   Hyperlipidemia 06/11/2014   Type 2 diabetes mellitus with diabetic chronic kidney disease ([place])    Glaucoma    Essential hypertension 06/28/2006   Gastroesophageal reflux disease 06/28/2006   ECZEMA, ATOPIC DERMATITIS 06/28/2006    ONSET DATE: 11/07/21   REFERRING DIAG: I63.9 (ICD-10-CM) - Acute ischemic stroke (HCC)  THERAPY DIAG:  Cognitive communication deficit  Rationale for Evaluation and Treatment Rehabilitation  SUBJECTIVE:   SUBJECTIVE STATEMENT: "I think I'm doing ok"  Pt accompanied by: significant  other  PERTINENT HISTORY: MRI of the brain showed a small acute and subacute infarcts mostly involved right periaqueductal gray matter, mild to moderate chronic microvascular ischemic changes.MRA of the brain, no proximal intracranial vessel occlusion.  Atherosclerosis.  Recent CTA of the head and neck done on 5/23 with no significant disease.  PAIN:  Are you having pain? No   FALLS: Has patient fallen in last 6 months?  See PT evaluation for details  LIVING ENVIRONMENT: Lives with: lives with their spouse Lives in: House/apartment  PLOF:  Level of assistance: Independent with IADLs Employment: Retired   PATIENT GOALS none stated  OBJECTIVE:   COGNITION: Overall cognitive status: Within functional limits for tasks assessed Areas of impairment:  Memory: Impaired: Immediate Long term Functional deficits: Though pt demonstrating some long term memory impairment, her and spouse agree this is not a change in pt's baseline and additionally memory changes are being managed successfully at home.   COGNITIVE COMMUNICATION Auditory comprehension: WFL Verbal expression: WFL Functional communication: WFL  ORAL MOTOR EXAMINATION Overall status: Did not assess Comments: reports no difficulties with swallowing, no dysarthria observed  STANDARDIZED ASSESSMENTS: Informal assessment completed d/t no pt complaints. See clinical impression.    TODAY'S TREATMENT:  Education for memory strategies, which pt reports to using successfully at home to support memory changes she has been experiencing since prior to stroke. Informal evaluation reveals no overt communication, cognition, or swallowing challenges at this time. Education on SLP observation and recommendations. All  questions answered to satisfaction.    PATIENT EDUCATION: Education details: see above Person educated: Patient and Spouse Education method: Customer service manager Education comprehension: verbalized understanding  and returned demonstration ASSESSMENT:  CLINICAL IMPRESSION: Patient is a 76 y.o. F who was seen today for cognitive communication evaluation s/p stroke. Pt has been home for 1 month, reports no concerns for swallowing, cognition, or communication skills. SLP faciliated informal evaluation in which pt demonstrates cognition skills to be within functional limits for tasks assessed. Pt reports communication abilities to be within functional limits for expressing thoughts, wants, and needs across variety of communication partners. Is participating successfully in preferred and desired activities at home with no overt deficits since stroke 1 month ago. Husband endorses, tells SLP pt has returned to baseline following initial fatigue which did impact participation. Some memory deficits evidenced but appear to be consistent with pt's baseline. Denies swallowing difficulties. Given no significant functional changes in cognitive linguistic skills reported or exhibited, skilled ST is not recommended that this time. Pt and husband verbalize understanding of requirement for new referral should functional deficits present in future.     PLAN: SLP FREQUENCY: one time visit  SLP DURATION:  1 sessions   Su Monks, [place] 12/06/2021, 2:08 PM

## 2021-12-13 ENCOUNTER — Telehealth (HOSPITAL_COMMUNITY): Payer: Self-pay

## 2021-12-13 NOTE — Telephone Encounter (Signed)
Transitions of Care Pharmacy  ° °Call attempted for a pharmacy transitions of care follow-up. HIPAA appropriate voicemail was left with call back information provided.  ° °Call attempt #1. Will follow-up in 2-3 days.  °  °

## 2021-12-15 ENCOUNTER — Telehealth (HOSPITAL_COMMUNITY): Payer: Self-pay

## 2021-12-15 ENCOUNTER — Other Ambulatory Visit (HOSPITAL_COMMUNITY): Payer: Self-pay

## 2021-12-15 NOTE — Telephone Encounter (Signed)
Pharmacy Transitions of Care Follow-up Telephone Call  Date of discharge: 11/09/2021  Discharge Diagnosis: Acute ischemic stroke   How have you been since you were released from the hospital? Patient has been doing well since discharge. She has no questions or concerns regarding her medications.    Medication changes made at discharge:  CHANGE how you take: clopidogrel (Plavix)   STOP taking: aspirin EC 81 MG tablet  simvastatin 80 MG tablet (ZOCOR)    ASK how to take: aspirin EC 81 MG tablet  Brilinta 90 MG Tabs tablet (ticagrelor)  Patient was taking ticagrelor 90 mg tablets until 12/11/2021; then, she started clopidogrel 75 mg once daily.   Medication changes verified by the patient? Yes   Medication Accessibility:  [facility]   Was the patient provided with refills on discharged medications? No   Have all prescriptions been transferred from [facility] to home pharmacy? Yes   Is the patient interested in using a [place]? No  Is the patient able to afford medications? Yes  Referred patient to patient care advocate for medication assistance? No  Medication Review:  CLOPIDOGREL (PLAVIX) Clopidogrel 75 mg once daily - Discussed importance of taking medication around the same time every day.  - Advised patient of medications to avoid (NSAIDs, ASA maintenance doses>100 mg daily)  - Educated that Tylenol (acetaminophen) will be the preferred analgesic to prevent risk of bleeding. - Emphasized importance of monitoring for signs and symptoms of bleeding (abnormal bruising, prolonged bleeding, nose bleeds, bleeding from gums, discolored urine, black tarry stools)  - Advised patient to alert all providers of anticoagulation therapy prior to starting a new medication or having a procedure.  Follow-up Appointments: [facility] f/u appt confirmed? Saw Rollen Sox, [facility] on 11/15/2021   If their condition worsens, is the pt aware to call PCP or go to the  Emergency Dept.? Yes  Final Patient Assessment: Patient has had Cardiology follow-up and has refills sent to home pharmacy.

## 2021-12-23 ENCOUNTER — Ambulatory Visit: Payer: Medicare HMO | Admitting: Cardiology

## 2021-12-23 ENCOUNTER — Encounter: Payer: Self-pay | Admitting: Cardiology

## 2021-12-23 VITALS — BP 124/88 | HR 108 | Ht 65.0 in | Wt 190.0 lb

## 2021-12-23 DIAGNOSIS — I639 Cerebral infarction, unspecified: Secondary | ICD-10-CM

## 2021-12-23 NOTE — Patient Instructions (Signed)
Medication Instructions:  Your physician recommends that you continue on your current medications as directed. Please refer to the Current Medication list given to you today.  Labwork: None ordered.  Testing/Procedures: None ordered.  Follow-Up:  Your physician wants you to follow-up in: as needed.  You will receive a reminder letter in the mail two months in advance. If you don't receive a letter, please call our office to schedule the follow-up appointment.    Implantable Loop Recorder Placement, Care After This sheet gives you information about how to care for yourself after your procedure. Your health care provider may also give you more specific instructions. If you have problems or questions, contact your health care provider. What can I expect after the procedure? After the procedure, it is common to have: Soreness or discomfort near the incision. Some swelling or bruising near the incision.  Follow these instructions at home: Incision care  Monitor your cardiac device site for redness, swelling, and drainage. Call the device clinic at [phone] if you experience these symptoms or fever/chills.  Keep the large square bandage on your site for 24 hours and then you may remove it yourself. Keep the steri-strips underneath in place.   You may shower after 72 hours / 3 days from your procedure with the steri-strips in place. They will usually fall off on their own, or may be removed after 10 days. Pat dry.   Avoid lotions, ointments, or perfumes over your incision until it is well-healed.  Please do not submerge in water until your site is completely healed.   Your device is MRI compatible.   Remote monitoring is used to monitor your cardiac device from home. This monitoring is scheduled every month by our office. It allows [place] to keep an eye on the function of your device to ensure it is working properly.  If your wound site starts to bleed apply pressure.    For help  with the monitor please call [employer] Monitor Support Specialist directly at [phone].    If you have any questions/concerns please call the device clinic at [phone].  Activity  Return to your normal activities.  General instructions Follow instructions from your health care provider about how to manage your implantable loop recorder and transmit the information. Learn how to activate a recording if this is necessary for your type of device. You may go through a metal detection gate, and you may let someone hold a metal detector over your chest. Show your ID card if needed. Do not have an MRI unless you check with your health care provider first. Take over-the-counter and prescription medicines only as told by your health care provider. Keep all follow-up visits as told by your health care provider. This is important. Contact a health care provider if: You have redness, swelling, or pain around your incision. You have a fever. You have pain that is not relieved by your pain medicine. You have triggered your device because of fainting (syncope) or because of a heartbeat that feels like it is racing, slow, fluttering, or skipping (palpitations). Get help right away if you have: Chest pain. Difficulty breathing. Summary After the procedure, it is common to have soreness or discomfort near the incision. Change your dressing as told by your health care provider. Follow instructions from your health care provider about how to manage your implantable loop recorder and transmit the information. Keep all follow-up visits as told by your health care provider. This is important. This information is not intended  to replace advice given to you by your health care provider. Make sure you discuss any questions you have with your health care provider. Document Released: 03/29/2015 Document Revised: 06/02/2017 Document Reviewed: 06/02/2017 Elsevier Patient Education  2020 [employer].

## 2021-12-23 NOTE — Progress Notes (Signed)
Electrophysiology Office Note   Date:  12/23/2021   ID:  Heather Gross, DOB 1945-11-29, MRN [id number]  PCP:  [name], NP  Cardiologist: Oval Linsey Primary Electrophysiologist:  Maveryck Bahri Meredith Leeds, MD    Chief Complaint: CVA   History of Present Illness: Heather Gross is a 76 y.o. female who is being seen today for the evaluation of CVA at the request of [name], NP. Presenting today for electrophysiology evaluation.  She has a history significant for hypertension, hyperlipidemia, type 2 diabetes, CKD stage IIIa.  She presented to the hospital July 2023 with blurry vision.  She was found to have cryptogenic stroke.  She had had multifocal acute and subacute strokes on her imaging.  She had a full work-up without major abnormality.  She presents today for possible loop monitor implant.  Today, she denies symptoms of palpitations, chest pain, shortness of breath, orthopnea, PND, lower extremity edema, claudication, dizziness, presyncope, syncope, bleeding, or neurologic sequela. The patient is tolerating medications without difficulties.    Past Medical History:  Diagnosis Date   Glaucoma    Hyperlipidemia    Hypertension    Stroke [facility])    Past Surgical History:  Procedure Laterality Date   BREAST BIOPSY Right 09/29/2015   BREAST EXCISIONAL BIOPSY Right 11/04/2015   papilloma   BREAST LUMPECTOMY WITH RADIOACTIVE SEED LOCALIZATION Right 11/04/2015   Procedure: BREAST LUMPECTOMY WITH RADIOACTIVE SEED LOCALIZATION;  Surgeon: Autumn Messing III, MD;  Location: [place];  Service: General;  Laterality: Right;   LIPOMA EXCISION       Current Outpatient Medications  Medication Sig Dispense Refill   Calcium 500 MG CHEW Chew 2 tablets (1,000 mg total) by mouth daily. 30 tablet 0   clopidogrel (PLAVIX) 75 MG tablet Take 1 tablet (75 mg total) by mouth daily. Start on December 11, 2021 30 tablet 11   [employer] D3 2000 units CAPS TAKE 1 CAPSULE BY MOUTH EVERY DAY (Patient  taking differently: Take 2,000 Units by mouth daily after breakfast.) 100 capsule 2   ezetimibe (ZETIA) 10 MG tablet Take 10 mg by mouth daily.     fluticasone (FLONASE) 50 MCG/ACT nasal spray Place 1-2 sprays into both nostrils daily as needed for allergies or rhinitis.     metFORMIN (GLUCOPHAGE) 500 MG tablet TAKE 1 TABLET BY MOUTH AT BEDTIME. (Patient taking differently: Take 500 mg by mouth daily with breakfast.) 30 tablet 0   Multiple Vitamin (MULTIVITAMIN) tablet Take 1 tablet by mouth daily with breakfast.     Olmesartan-amLODIPine-HCTZ 40-5-25 MG TABS Take 1 tablet by mouth daily. 90 tablet 3   pantoprazole (PROTONIX) 20 MG tablet Take 1 tablet (20 mg total) by mouth daily. (Patient taking differently: Take 20 mg by mouth daily as needed for indigestion.) 30 tablet 3   rosuvastatin (CRESTOR) 40 MG tablet Take 40 mg by mouth at bedtime.     No current facility-administered medications for this visit.    Allergies:   Lipitor [atorvastatin], Shellfish-derived products, and Latex   Social History:  The patient  reports that she quit smoking about 31 years ago. Her smoking use included cigarettes. She has never used smokeless tobacco. She reports that she does not drink alcohol and does not use drugs.   Family History:  The patient's family history includes Breast cancer in her sister; Diabetes in her brother, sister, sister, sister, sister, and sister; Heart disease in her father.    ROS:  Please see the history of present illness.  Otherwise, review of systems is positive for none.   All other systems are reviewed and negative.    PHYSICAL EXAM: VS:  BP 124/88   Pulse (!) 108   Ht '5\' 5"'$  (1.651 m)   Wt 190 lb (86.2 kg)   SpO2 98%   BMI 31.62 kg/m  , BMI Body mass index is 31.62 kg/m. GEN: Well nourished, well developed, in no acute distress  HEENT: normal  Neck: no JVD, carotid bruits, or masses Cardiac: RRR; no murmurs, rubs, or gallops,no edema  Respiratory:  clear to  auscultation bilaterally, normal work of breathing GI: soft, nontender, nondistended, + BS MS: no deformity or atrophy  Skin: warm and dry Neuro:  Strength and sensation are intact Psych: euthymic mood, full affect  EKG:  EKG is ordered today. Personal review of the ekg ordered shows sinus rhythm  Recent Labs: 09/01/2021: TSH 0.768 11/06/2021: ALT 27; Platelets 194 11/07/2021: Hemoglobin 11.9 11/09/2021: BUN 23; Creatinine, Ser 1.60; Potassium 3.3; Sodium 138    Lipid Panel     Component Value Date/Time   CHOL 91 11/08/2021 0517   TRIG 166 (H) 11/08/2021 0517   HDL 30 (L) 11/08/2021 0517   CHOLHDL 3.0 11/08/2021 0517   VLDL 33 11/08/2021 0517   LDLCALC 28 11/08/2021 0517   LDLCALC 93 12/30/2019 0821   LDLDIRECT 88.9 08/13/2006 1412     Wt Readings from Last 3 Encounters:  12/23/21 190 lb (86.2 kg)  11/15/21 184 lb 9.6 oz (83.7 kg)  10/18/21 188 lb 12.8 oz (85.6 kg)      Other studies Reviewed: Additional studies/ records that were reviewed today include: TTE 11/09/21  Review of the above records today demonstrates:   1. Left ventricular ejection fraction, by estimation, is 60 to 65%. The  left ventricle has normal function. The left ventricle has no regional  wall motion abnormalities. There is mild concentric left ventricular  hypertrophy. Left ventricular diastolic  parameters are consistent with Grade I diastolic dysfunction (impaired  relaxation).   2. Right ventricular systolic function was not well visualized. The right  ventricular size is normal. There is normal pulmonary artery systolic  pressure. The estimated right ventricular systolic pressure is 93.9 mmHg.   3. The mitral valve is normal in structure. No evidence of mitral valve  regurgitation. No evidence of mitral stenosis.   4. The aortic valve is tricuspid. Aortic valve regurgitation is not  visualized. Aortic valve sclerosis/calcification is present, without any  evidence of aortic stenosis.   5. The  inferior vena cava is normal in size with greater than 50%  respiratory variability, suggesting right atrial pressure of 3 mmHg.    ASSESSMENT AND PLAN:  1.  Cryptogenic stroke: At this point, no causes been found.  She is worn a monitor without atrial fibrillation, echo with a normal ejection fraction.  Due to that, we Heather Gross plan for loop monitor implant.  Risk and benefits of been discussed risk of bleeding and infection.  She understands these risks and is agreed to the procedure.    Current medicines are reviewed at length with the patient today.   The patient does not have concerns regarding her medicines.  The following changes were made today:  none  Labs/ tests ordered today include:  Orders Placed This Encounter  Procedures   EKG 12-Lead     Disposition:   FU with Taeveon Keesling pending link monitor results  Signed, Juleen Sorrels Meredith Leeds, MD  12/23/2021 12:52 PM  [place] [address] [phone] (office) [phone] (fax)  SURGEON:  Allegra Lai, MD     PREPROCEDURE DIAGNOSIS:  Cryptogenic Stroke    POSTPROCEDURE DIAGNOSIS:  Cryptogenic Stroke     PROCEDURES:   1. Implantable loop recorder implantation    INTRODUCTION:  Heather Gross is a 76 y.o. female with a history of unexplained stroke who presents today for implantable loop implantation.  The patient has had a cryptogenic stroke.  Despite an extensive workup by neurology, no reversible causes have been identified.  she has worn telemetry during which she did not have arrhythmias.  There is significant concern for possible atrial fibrillation as the cause for the patients stroke.  The patient therefore presents today for implantable loop implantation.     DESCRIPTION OF PROCEDURE:  Informed written consent was obtained, and the patient was brought to the electrophysiology lab in a fasting state.  The patient required no sedation for the procedure today.   Mapping over the patient's chest was performed by the EP lab staff to identify the area where electrograms were most prominent for ILR recording.  This area was found to be the left parasternal region over the 3rd-4th intercostal space. The patients left chest was therefore prepped and draped in the usual sterile fashion by the EP lab staff. The skin overlying the left parasternal region was infiltrated with lidocaine for local analgesia.  A 0.5-cm incision was made over the left parasternal region over the 3rd intercostal space.  A subcutaneous ILR pocket was fashioned using a combination of sharp and blunt dissection.  A Medtronic Reveal Linq model [place] [id number] implantable loop recorder was then placed into the pocket  R waves were very prominent and measured 0.62 mV. EBL<1 ml.  Steri- Strips and a sterile dressing were then applied.  There were no early apparent complications.     CONCLUSIONS:   1. Successful implantation of a Medtronic Reveal LINQ implantable loop recorder for cryptogenic stroke  2. No early apparent complications.

## 2021-12-27 ENCOUNTER — Ambulatory Visit: Payer: Medicare HMO | Admitting: Physical Therapy

## 2021-12-27 ENCOUNTER — Ambulatory Visit: Payer: Medicare HMO | Admitting: Occupational Therapy

## 2021-12-27 VITALS — BP 155/79 | HR 99

## 2021-12-27 DIAGNOSIS — M6281 Muscle weakness (generalized): Secondary | ICD-10-CM

## 2021-12-27 DIAGNOSIS — R41842 Visuospatial deficit: Secondary | ICD-10-CM

## 2021-12-27 DIAGNOSIS — R278 Other lack of coordination: Secondary | ICD-10-CM

## 2021-12-27 DIAGNOSIS — R41840 Attention and concentration deficit: Secondary | ICD-10-CM

## 2021-12-27 DIAGNOSIS — R41844 Frontal lobe and executive function deficit: Secondary | ICD-10-CM

## 2021-12-27 DIAGNOSIS — R2689 Other abnormalities of gait and mobility: Secondary | ICD-10-CM

## 2021-12-27 DIAGNOSIS — R2681 Unsteadiness on feet: Secondary | ICD-10-CM

## 2021-12-27 NOTE — Therapy (Unsigned)
OUTPATIENT OCCUPATIONAL THERAPY NEURO Treatment  Patient Name: Heather Gross MRN: [id number] DOB:04/03/1946, 76 y.o., female Today's Date: 12/27/2021  PCP:  [name] NP REFERRING PROVIDER: Dr. Louanne Belton   OT End of Session - 12/27/21 1232     Visit Number 2    Number of Visits 12    Date for OT Re-Evaluation 02/28/22    Authorization Type [employer]    Authorization - Visit Number 1    Progress Note Due on Visit 10    OT Start Time 1231    OT Stop Time 1310    OT Time Calculation (min) 39 min             Past Medical History:  Diagnosis Date   Glaucoma    Hyperlipidemia    Hypertension    Stroke [facility])    Past Surgical History:  Procedure Laterality Date   BREAST BIOPSY Right 09/29/2015   BREAST EXCISIONAL BIOPSY Right 11/04/2015   papilloma   BREAST LUMPECTOMY WITH RADIOACTIVE SEED LOCALIZATION Right 11/04/2015   Procedure: BREAST LUMPECTOMY WITH RADIOACTIVE SEED LOCALIZATION;  Surgeon: Autumn Messing III, MD;  Location: [place];  Service: General;  Laterality: Right;   LIPOMA EXCISION     Patient Active Problem List   Diagnosis Date Noted   Family history of malignant neoplasm of digestive organs 10/28/2021   Cerebrovascular disease 09/13/2021   Acute ischemic stroke ([place]) 09/01/2021   LBBB (left bundle branch block) 09/01/2021   Obesity (BMI 30-39.9) 12/30/2019   Stage 3a chronic kidney disease (CKD) ([place]) -Baseline Scr 1.3-1.6 02/05/2017   Osteopenia 11/02/2016   Vitamin D deficiency 12/10/2014   Hyperlipidemia 06/11/2014   Type 2 diabetes mellitus with diabetic chronic kidney disease ([place])    Glaucoma    Essential hypertension 06/28/2006   Gastroesophageal reflux disease 06/28/2006   ECZEMA, ATOPIC DERMATITIS 06/28/2006    ONSET DATE: 11/07/21  REFERRING DIAG: CVA  THERAPY DIAG:  Muscle weakness (generalized)  Unsteadiness on feet  Visuospatial deficit  Attention and concentration deficit  Frontal lobe and executive function  deficit  Other lack of coordination  Rationale for Evaluation and Treatment Rehabilitation  SUBJECTIVE:   SUBJECTIVE STATEMENT: Denies pain Pt accompanied by: husband  PERTINENT HISTORY: 76 yo female presenting to ED on 7/10 with diplopia. Recently came to ED on 7/8 for diplopia, MRI was ordered but wait was too long so pt left. 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   PRECAUTIONS: Fall  WEIGHT BEARING RESTRICTIONS No  PAIN:  Are you having pain? No  FALLS: Has patient fallen in last 6 months? No  LIVING ENVIRONMENT: Lives with: lives with their spouse Lives in: House/apartment Stairs: No Has following equipment at home: Single point cane  PLOF: Independent  PATIENT GOALS to get back to myself, to drive  OBJECTIVE:   VISION: Subjective report: Pt reports diplopia with fatigue Baseline vision: Wears glasses all the time Visual history: glaucoma per pt report  VISION ASSESSMENT: Impaired Eye alignment: WFL Tracking/Visual pursuits: Able to track stimulus in all quads without difficulty Visual Fields: no apparent deficits  Patient has difficulty with following activities due to following visual impairments: reading, sometimes too blurry   OBSERVATIONS: Pt requires increased time and v.c for commands   TODAY'S TREATMENT:  Vision HEP issued, pt returned demonstration with min-mod v.c Pt practiced tub bench transfer, min v.c. and supervision for safety, handout provided  Copying small peg design with LUE for increased fine motor  coordination and cognitive/ visual skills.  PATIENT EDUCATION: 12/27/21 Vision HEP issued, tub bench transfers,  pt returned demonstration following education and min-mod v.c,   HOME EXERCISE PROGRAM: 12/27/21- vision HEP    GOALS:   SHORT TERM GOALS: Target date: 01/05/22   I with HEP for vision Baseline: Goal status: INITIAL  2.  I with HEP for LUE coordination Baseline:  Goal  status: INITIAL  3.  Pt will verbalize understanding of compensatory strategies for short term memory/ cognitive deficits. Baseline:  Goal status: INITIAL  4.  Pt/ husband will verbalize understanding of AE recommendations for increased safety with shower transfers/ shower.(Ie: tub transfer bench) Baseline:  Goal status: INITIAL  5  LONG TERM GOALS: Target date: 02/28/22  Pt will perform basic cooking with supervision demonstrating good safety awareness. Baseline:  Goal status: INITIAL  2.  Pt will perform home management activities demonstrating good safety awareness. Baseline:  Goal status: INITIAL  3.  Pt report ability to consistently read newsprint without diplopia Baseline:  Goal status: INITIAL  ASSESSMENT:  CLINICAL IMPRESSION: Patient is a 76 y.o. female who was seen today for occupational therapy evaluation for CVA. PMH including HTN, HLD, and prior strokes.   PERFORMANCE DEFICITS in functional skills including ADLs, IADLs, coordination, dexterity, FMC, mobility, balance, decreased knowledge of precautions, decreased knowledge of use of DME, vision, and UE functional use, cognitive skills including attention, learn, memory, problem solving, safety awareness, sequencing, thought, and understand, and psychosocial skills including coping strategies, environmental adaptation, habits, interpersonal interactions, and routines and behaviors.   IMPAIRMENTS are limiting patient from ADLs, IADLs, rest and sleep, play, leisure, and social participation.   COMORBIDITIES may have co-morbidities  that affects occupational performance. Patient will benefit from skilled OT to address above impairments and improve overall function.  MODIFICATION OR ASSISTANCE TO COMPLETE EVALUATION: Min-Moderate modification of tasks or assist with assess necessary to complete an evaluation.  OT OCCUPATIONAL PROFILE AND HISTORY: Detailed assessment: Review of records and additional review of  physical, cognitive, psychosocial history related to current functional performance.  CLINICAL DECISION MAKING: LOW - limited treatment options, no task modification necessary  REHAB POTENTIAL: Good  EVALUATION COMPLEXITY: Low    PLAN: OT FREQUENCY: 1x/week  OT DURATION: 12 weeks (anticipate d/c after 6 weeks)  PLANNED INTERVENTIONS: self care/ADL training, therapeutic exercise, therapeutic activity, neuromuscular re-education, manual therapy, gait training, balance training, functional mobility training, moist heat, cryotherapy, contrast bath, patient/family education, cognitive remediation/compensation, visual/perceptual remediation/compensation, coping strategies training, DME and/or AE instructions, and Re-evaluation  RECOMMENDED OTHER SERVICES: PT  CONSULTED AND AGREED WITH PLAN OF CARE: Patient and family member/caregiver  PLAN FOR NEXT SESSION: vision exercises, education regarding tub bench, memory strategies.   Milad Bublitz, OT 12/27/2021, 12:33 PM

## 2021-12-27 NOTE — Therapy (Signed)
OUTPATIENT PHYSICAL THERAPY NEURO TREATMENT   Patient Name: Heather Gross MRN: [id number] DOB:1945-05-13, 76 y.o., female Today's Date: 12/27/2021   PCP: [name], NP REFERRING PROVIDER: Flora Lipps, MD    PT End of Session - 12/27/21 1149     Visit Number 2    Number of Visits 7   with eval   Date for PT Re-Evaluation 01/17/22    Authorization Type [employer]    PT Start Time 1147    PT Stop Time 1227    PT Time Calculation (min) 40 min    Equipment Utilized During Treatment Gait belt    Activity Tolerance Patient tolerated treatment well    Behavior During Therapy WFL for tasks assessed/performed              Past Medical History:  Diagnosis Date   Glaucoma    Hyperlipidemia    Hypertension    Stroke [facility])    Past Surgical History:  Procedure Laterality Date   BREAST BIOPSY Right 09/29/2015   BREAST EXCISIONAL BIOPSY Right 11/04/2015   papilloma   BREAST LUMPECTOMY WITH RADIOACTIVE SEED LOCALIZATION Right 11/04/2015   Procedure: BREAST LUMPECTOMY WITH RADIOACTIVE SEED LOCALIZATION;  Surgeon: Autumn Messing III, MD;  Location: [place];  Service: General;  Laterality: Right;   LIPOMA EXCISION     Patient Active Problem List   Diagnosis Date Noted   Family history of malignant neoplasm of digestive organs 10/28/2021   Cerebrovascular disease 09/13/2021   Acute ischemic stroke ([place]) 09/01/2021   LBBB (left bundle branch block) 09/01/2021   Obesity (BMI 30-39.9) 12/30/2019   Stage 3a chronic kidney disease (CKD) ([place]) -Baseline Scr 1.3-1.6 02/05/2017   Osteopenia 11/02/2016   Vitamin D deficiency 12/10/2014   Hyperlipidemia 06/11/2014   Type 2 diabetes mellitus with diabetic chronic kidney disease ([place])    Glaucoma    Essential hypertension 06/28/2006   Gastroesophageal reflux disease 06/28/2006   ECZEMA, ATOPIC DERMATITIS 06/28/2006    ONSET DATE: 11/09/2021   REFERRING DIAG: I63.9 (ICD-10-CM) - Acute ischemic stroke (HCC)    THERAPY DIAG:  Unsteadiness on feet  Muscle weakness (generalized)  Other abnormalities of gait and mobility  Rationale for Evaluation and Treatment Rehabilitation  SUBJECTIVE:                                                                                                                                                                                              SUBJECTIVE STATEMENT: Pt reports no falls since last visit, no pain. Pt had loop recorder placed last week.  Pt accompanied by: self and significant other husband [name]  PERTINENT HISTORY: hypertension, hyperlipidemia, CKD 3, LBBB, obesity, and recent stroke.   PAIN:  Are you having pain? No  PRECAUTIONS: Fall   PATIENT GOALS "to get stronger"  OBJECTIVE:   TODAY'S TREATMENT:   Vitals:   12/27/21 1151  BP: (!) 155/79  Pulse: 99   Pt reports eating sausage biscuit prior to PT session. Pt would benefit from education regarding dietary modifications due to history of stroke and increased risk of another stroke.   THER ACT  [facility] PT Assessment - 12/27/21 1159       Ambulation/Gait   Gait velocity 32.8 ft over 15.29 sec = 2.15 ft/sec      Standardized Balance Assessment   Standardized Balance Assessment Berg Balance Test      Berg Balance Test   Sit to Stand Able to stand without using hands and stabilize independently    Standing Unsupported Able to stand safely 2 minutes    Sitting with Back Unsupported but Feet Supported on Floor or Stool Able to sit safely and securely 2 minutes    Stand to Sit Sits safely with minimal use of hands    Transfers Able to transfer safely, minor use of hands    Standing Unsupported with Eyes Closed Able to stand 10 seconds with supervision    Standing Unsupported with Feet Together Able to place feet together independently and stand for 1 minute with supervision    From Standing, Reach Forward with Outstretched Arm Can reach forward >5 cm safely (2")    From Standing  Position, Pick up Object from [place] to pick up shoe, needs supervision    From Standing Position, Turn to Look Behind Over each Shoulder Looks behind from both sides and weight shifts well    Turn 360 Degrees Able to turn 360 degrees safely but slowly    Standing Unsupported, Alternately Place Feet on Step/Stool Able to complete 4 steps without aid or supervision    Standing Unsupported, One Foot in Front Able to take small step independently and hold 30 seconds    Standing on One Leg Able to lift leg independently and hold equal to or more than 3 seconds    Total Score 43    Berg comment: 43/56, signficant fall risk            Initiated HEP with patient, see below  PATIENT EDUCATION: Education details: OM results and functional implications, HEP Person educated: Patient and Spouse Education method: Explanation and Handouts Education comprehension: verbalized understanding   HOME EXERCISE PROGRAM: Access Code: [email or web address] URL: [email or web address] Date: 12/27/2021 Prepared by: Excell Seltzer  Exercises - Alternating Step Taps with Counter Support  - 1 x daily - 7 x weekly - 3 sets - 10 reps - Side Stepping with Resistance at Ankles and Counter Support  - 1 x daily - 7 x weekly - 1 sets - 10 reps - Tandem Walking with Counter Support  - 1 x daily - 7 x weekly - 1 sets - 10 reps   GOALS: Goals reviewed with patient? Yes  SHORT TERM GOALS: Target date: 12/27/2021  Initial HEP to be established Baseline: initiated 8/29 Goal status: MET  2.  Pt will improve Berg score to 42/56 for decreased fall risk Baseline: 38/56 (8/8), 43/56 (8/29) Goal status: MET  3.  Pt will improve gait velocity to at least 2.5 ft/sec for improved gait efficiency and performance at Supervision level  Baseline: 2.26 ft/sec (8/8), 2.15 ft/sec (8/29) Goal status: IN PROGRESS  LONG TERM GOALS: Target date: 01/17/2022  Pt will be independent with final balance HEP for improved  strength, balance, transfers and gait. Baseline:  Goal status: INITIAL  2.  Pt will improve Berg score to 46/56 for decreased fall risk Baseline: 38/56 (8/8), 43/56 (8/29) Goal status: INITIAL  3.  Pt will improve gait velocity to at least 3.0 ft/sec for improved gait efficiency and performance at Supervision level   Baseline: 2.26 ft/sec (8/8), 2.15 ft/sec (8/29) Goal status: INITIAL   ASSESSMENT:  CLINICAL IMPRESSION: Emphasis of skilled PT session on reassessing STG and initiating HEP for strengthening and balance. Pt has met 2/3 STG (initial HEP established, improved Berg score from 38/56 to 43/56). However, pt does exhibit decreased gait speed from initial evaluation from 2.26 ft/sec to 2.15 ft/sec. Pt also continues to exhibit L inattention leading to decreased safety and independence with functional mobility. Pt will continue to benefit from skilled therapy services to address balance and safety impairments. Continue POC.   OBJECTIVE IMPAIRMENTS Abnormal gait, decreased balance, decreased safety awareness, and impaired vision/preception.   ACTIVITY LIMITATIONS carrying, lifting, bending, squatting, stairs, and locomotion level  PARTICIPATION LIMITATIONS: meal prep, cleaning, laundry, interpersonal relationship, driving, shopping, and community activity  PERSONAL FACTORS Age and 1-2 comorbidities:   hypertension, hyperlipidemia, CKD 3, LBBB, obesity, and recent stroke. are also affecting patient's functional outcome.   REHAB POTENTIAL: Good  CLINICAL DECISION MAKING: Stable/uncomplicated  EVALUATION COMPLEXITY: Low  PLAN: PT FREQUENCY: 1x/week  PT DURATION: 6 weeks  PLANNED INTERVENTIONS: Therapeutic exercises, Therapeutic activity, Neuromuscular re-education, Balance training, Gait training, Patient/Family education, Self Care, Joint mobilization, Stair training, DME instructions, Cryotherapy, Moist heat, Manual therapy, and Re-evaluation  PLAN FOR NEXT SESSION: add to  HEP for balance (tandem, SLS, etc.), work on L inattention, wants to work towards decreased AD reliance   Excell Seltzer, PT, DPT, CSRS 12/27/2021, 12:28 PM

## 2021-12-27 NOTE — Patient Instructions (Addendum)
Oculomotor: Smooth Pursuits    Holding a target, keep eyes on target and slowly move target side to side with head still. Perform sitting. Repeat _5-10___ times per session. Do 1-2____ sessions per day. Repeat using target on pattern background.  Copyright  VHI. All rights reserved.

## 2021-12-28 ENCOUNTER — Ambulatory Visit: Payer: Medicare HMO | Admitting: Adult Health

## 2021-12-28 ENCOUNTER — Encounter: Payer: Self-pay | Admitting: Adult Health

## 2021-12-28 VITALS — BP 159/95 | HR 79 | Ht 65.0 in | Wt 189.9 lb

## 2021-12-28 DIAGNOSIS — E785 Hyperlipidemia, unspecified: Secondary | ICD-10-CM | POA: Diagnosis not present

## 2021-12-28 DIAGNOSIS — I639 Cerebral infarction, unspecified: Secondary | ICD-10-CM | POA: Diagnosis not present

## 2021-12-28 DIAGNOSIS — I10 Essential (primary) hypertension: Secondary | ICD-10-CM

## 2021-12-28 NOTE — Patient Instructions (Signed)
Your Plan:  Continue Plavix 75 mg daily   Blood pressure goal <130/90 Cholesterol LDL goal <70 Diabetes goal A1c <7 Monitor diet and try to exercise   Thank you for coming to see [place] at [facility]. I hope we have been able to provide you high quality care today.  You may receive a patient satisfaction survey over the next few weeks. We would appreciate your feedback and comments so that we may continue to improve ourselves and the health of our patients.

## 2021-12-28 NOTE — Progress Notes (Signed)
PATIENT: Heather Gross DOB: Feb 20, 1946  REASON FOR VISIT: follow up HISTORY FROM: patient PRIMARY NEUROLOGIST: Dr. Leonie Man  Chief Complaint  Patient presents with   Hospitalization Follow-up    Rm 48, husband.   Seen [facility] 11-07-2021.  Getting PT/OT. At neuro rehab.      HISTORY OF PRESENT ILLNESS: Today 12/28/21:  Heather Gross is a 76 year old female with a history of stroke.  She returns today for follow-up.  Patient had a stroke back in May and then went back into the hospital in July for double vision.  She is now only on Plavix 75 mg daily.Now has a loop recorder. Diplopia comes and goes. Only in the left eye. Will be seeing an opthamologist this month 9/16.  Overall she has been doing well since her hospitalization.  Reports that she is trying to eat healthy and walk daily.  She returns today for an evaluation.  HISTORY Heather Gross is a 76 y.o. female with history of diabetes, hypertension, hyperlipidemia, CKD admitted for double vision. No tPA given due to outside window.     Stroke: Multifocal infarct, embolic, concerning for occult A-fib MRI left dorsal pontine small infarct, right paramedian midbrain infarct, left temporal and bilateral frontal small infarcts. MRA bilateral siphon, right M2 atherosclerosis Carotid Doppler unremarkable 2D Echo EF 60 to 65% LE venous Doppler negative for DVT EP recommend cardiac event monitoring given overnight several runs of atrial tachycardia. LDL 28 HgbA1c 7.0 Lovenox for VTE prophylaxis clopidogrel 75 mg daily prior to admission, now on aspirin 81 mg daily and Brilinta (ticagrelor) 90 mg bid for 30 days and then Plavix alone. Patient counseled to be compliant with her antithrombotic medications Ongoing aggressive stroke risk factor management Therapy recommendations: Home health PT and outpatient OT Disposition: Pending  History of stroke 09/01/2021 admitted for falling and difficulty speaking.  MRI showed right frontoparietal  white matter small infarct.  EF 55 to 60%, LDL 60, A1c 6.5, CTA head and neck showed bilateral siphon, A3 and the right M2 stenosis.  Discharged on DAPT and Crestor 40 09/14/2021 admitted again for confusion, speech difficulty.  CT PE negative.  MRI showed subacute left thalamic infarct.  CTA head and neck unchanged.  Discharged on aspirin and Brilinta.  REVIEW OF SYSTEMS: Out of a complete 14 system review of symptoms, the patient complains only of the following symptoms, and all other reviewed systems are negative.  ALLERGIES: Allergies  Allergen Reactions   Lipitor [Atorvastatin] Other (See Comments)    Made mouth very dry and the lips discolored   Shellfish-Derived Products Other (See Comments)    Hands break out   Latex Rash    HOME MEDICATIONS: Outpatient Medications Prior to Visit  Medication Sig Dispense Refill   Calcium 500 MG CHEW Chew 2 tablets (1,000 mg total) by mouth daily. 30 tablet 0   clopidogrel (PLAVIX) 75 MG tablet Take 1 tablet (75 mg total) by mouth daily. Start on December 11, 2021 30 tablet 11   [employer] D3 2000 units CAPS TAKE 1 CAPSULE BY MOUTH EVERY DAY (Patient taking differently: Take 2,000 Units by mouth daily after breakfast.) 100 capsule 2   ezetimibe (ZETIA) 10 MG tablet Take 10 mg by mouth daily.     fluticasone (FLONASE) 50 MCG/ACT nasal spray Place 1-2 sprays into both nostrils daily as needed for allergies or rhinitis.     metFORMIN (GLUCOPHAGE) 500 MG tablet TAKE 1 TABLET BY MOUTH AT BEDTIME. (Patient taking differently: Take 500 mg by mouth  daily with breakfast.) 30 tablet 0   Multiple Vitamin (MULTIVITAMIN) tablet Take 1 tablet by mouth daily with breakfast.     Olmesartan-amLODIPine-HCTZ 40-5-25 MG TABS Take 1 tablet by mouth daily. 90 tablet 3   pantoprazole (PROTONIX) 20 MG tablet Take 1 tablet (20 mg total) by mouth daily. (Patient taking differently: Take 20 mg by mouth daily as needed for indigestion.) 30 tablet 3   rosuvastatin (CRESTOR) 40 MG tablet  Take 40 mg by mouth at bedtime.     No facility-administered medications prior to visit.    PAST MEDICAL HISTORY: Past Medical History:  Diagnosis Date   Glaucoma    Hyperlipidemia    Hypertension    Stroke [facility])     PAST SURGICAL HISTORY: Past Surgical History:  Procedure Laterality Date   BREAST BIOPSY Right 09/29/2015   BREAST EXCISIONAL BIOPSY Right 11/04/2015   papilloma   BREAST LUMPECTOMY WITH RADIOACTIVE SEED LOCALIZATION Right 11/04/2015   Procedure: BREAST LUMPECTOMY WITH RADIOACTIVE SEED LOCALIZATION;  Surgeon: Autumn Messing III, MD;  Location: [place];  Service: General;  Laterality: Right;   LIPOMA EXCISION      FAMILY HISTORY: Family History  Problem Relation Age of Onset   Heart disease Father    Diabetes Sister    Breast cancer Sister    Diabetes Sister    Diabetes Sister    Diabetes Sister    Diabetes Sister    Diabetes Brother     SOCIAL HISTORY: Social History   Socioeconomic History   Marital status: Married    Spouse name: Not on file   Number of children: Not on file   Years of education: Not on file   Highest education level: Not on file  Occupational History   Not on file  Tobacco Use   Smoking status: Former    Types: Cigarettes    Quit date: 05/01/1990    Years since quitting: 31.6   Smokeless tobacco: Never  Substance and Sexual Activity   Alcohol use: No   Drug use: No   Sexual activity: Not Currently  Other Topics Concern   Not on file  Social History Narrative   Entered 06/2014:    Married x 48 years.   2 Children--2 sons   Social Determinants of Health   Financial Resource Strain: Low Risk  (10/24/2019)   Overall Financial Resource Strain (CARDIA)    Difficulty of Paying Living Expenses: Not very hard  Food Insecurity: Not on file  Transportation Needs: Not on file  Physical Activity: Not on file  Stress: Not on file  Social Connections: Not on file  Intimate Partner Violence: Not on file       PHYSICAL EXAM  Vitals:   12/28/21 0908  BP: (!) 159/95  Pulse: 79  Weight: 189 lb 14.4 oz (86.1 kg)  Height: '5\' 5"'$  (1.651 m)   Body mass index is 31.6 kg/m.  Generalized: Well developed, in no acute distress   Neurological examination  Mentation: Alert oriented to time, place, history taking. Follows all commands speech and language fluent Cranial nerve II-XII: Pupils were equal round reactive to light. Extraocular movements were full, visual field were full on confrontational test. Facial sensation and strength were normal. Head turning and shoulder shrug  were normal and symmetric. Motor: The motor testing reveals 5 over 5 strength of all 4 extremities. Good symmetric motor tone is noted throughout.  Sensory: Sensory testing is intact to soft touch on all 4 extremities. No evidence of  extinction is noted.  Coordination: Cerebellar testing reveals good finger-nose-finger and heel-to-shin bilaterally.  Gait and station: Gait is normal.    DIAGNOSTIC DATA (LABS, IMAGING, TESTING) - I reviewed patient records, labs, notes, testing and imaging myself where available.  Lab Results  Component Value Date   WBC 8.1 11/06/2021   HGB 11.9 (L) 11/07/2021   HCT 35.0 (L) 11/07/2021   MCV 93.0 11/06/2021   PLT 194 11/06/2021      Component Value Date/Time   NA 138 11/09/2021 0447   K 3.3 (L) 11/09/2021 0447   CL 105 11/09/2021 0447   CO2 24 11/09/2021 0447   GLUCOSE 118 (H) 11/09/2021 0447   BUN 23 11/09/2021 0447   CREATININE 1.60 (H) 11/09/2021 0447   CREATININE 1.12 (H) 12/30/2019 0821   CALCIUM 9.5 11/09/2021 0447   PROT 7.3 11/06/2021 1801   ALBUMIN 3.9 11/06/2021 1801   AST 26 11/06/2021 1801   ALT 27 11/06/2021 1801   ALKPHOS 43 11/06/2021 1801   BILITOT 0.5 11/06/2021 1801   GFRNONAA 33 (L) 11/09/2021 0447   GFRNONAA 49 (L) 12/30/2019 0821   GFRAA 56 (L) 12/30/2019 0821   Lab Results  Component Value Date   CHOL 91 11/08/2021   HDL 30 (L) 11/08/2021    LDLCALC 28 11/08/2021   LDLDIRECT 88.9 08/13/2006   TRIG 166 (H) 11/08/2021   CHOLHDL 3.0 11/08/2021   Lab Results  Component Value Date   HGBA1C 7.0 (H) 11/08/2021   No results found for: "VITAMINB12" Lab Results  Component Value Date   TSH 0.768 09/01/2021      ASSESSMENT AND PLAN 76 y.o. year old female  has a past medical history of Glaucoma, Hyperlipidemia, Hypertension, and Stroke ([place]). here with:  Stroke: Multifocal infarct, embolic, concerning for occult A-fib   Continue Plavix 75 mg daily   for secondary stroke prevention.  Discussed secondary stroke prevention measures and importance of close PCP follow up for aggressive stroke risk factor management. I have gone over the pathophysiology of stroke, warning signs and symptoms, risk factors and their management in some detail with instructions to go to the closest emergency room for symptoms of concern. HTN: BP goal <130/90.   HLD: LDL goal <70. Recent LDL 28.  DMII: A1c goal<7.0. Recent A1c 7.0.  Encouraged patient to monitor diet and encouraged exercise Now has a loop recorder Advised that after she has a follow-up with her ophthalmologist for diplopia if they cannot find a cause she can follow-up with our office for full work-up. FU with our office 3 to 4 months with Dr. Oletta Lamas, MSN, NP-C 12/28/2021, 9:03 AM [facility] [address] [place], [place] [address] [phone]

## 2021-12-30 NOTE — Progress Notes (Signed)
I agree with the above plan 

## 2022-01-05 ENCOUNTER — Ambulatory Visit: Payer: Medicare HMO | Admitting: Occupational Therapy

## 2022-01-05 ENCOUNTER — Ambulatory Visit: Payer: Medicare HMO | Admitting: Physical Therapy

## 2022-01-10 ENCOUNTER — Ambulatory Visit: Payer: Medicare HMO | Attending: Family Medicine | Admitting: Occupational Therapy

## 2022-01-10 ENCOUNTER — Ambulatory Visit: Payer: Medicare HMO | Admitting: Physical Therapy

## 2022-01-10 ENCOUNTER — Emergency Department (HOSPITAL_COMMUNITY)
Admission: EM | Admit: 2022-01-10 | Discharge: 2022-01-10 | Disposition: A | Discharge status: Home / Self Care | Payer: Medicare HMO | Attending: Student | Admitting: Student

## 2022-01-10 ENCOUNTER — Emergency Department (HOSPITAL_COMMUNITY): Payer: Medicare HMO

## 2022-01-10 ENCOUNTER — Encounter (HOSPITAL_COMMUNITY): Payer: Self-pay | Admitting: Emergency Medicine

## 2022-01-10 VITALS — BP 165/90

## 2022-01-10 DIAGNOSIS — Z7902 Long term (current) use of antithrombotics/antiplatelets: Secondary | ICD-10-CM | POA: Insufficient documentation

## 2022-01-10 DIAGNOSIS — E1122 Type 2 diabetes mellitus with diabetic chronic kidney disease: Secondary | ICD-10-CM | POA: Diagnosis not present

## 2022-01-10 DIAGNOSIS — Z79899 Other long term (current) drug therapy: Secondary | ICD-10-CM | POA: Diagnosis not present

## 2022-01-10 DIAGNOSIS — R2689 Other abnormalities of gait and mobility: Secondary | ICD-10-CM

## 2022-01-10 DIAGNOSIS — Z9011 Acquired absence of right breast and nipple: Secondary | ICD-10-CM | POA: Diagnosis not present

## 2022-01-10 DIAGNOSIS — R41844 Frontal lobe and executive function deficit: Secondary | ICD-10-CM | POA: Diagnosis present

## 2022-01-10 DIAGNOSIS — R2681 Unsteadiness on feet: Secondary | ICD-10-CM | POA: Insufficient documentation

## 2022-01-10 DIAGNOSIS — Z7984 Long term (current) use of oral hypoglycemic drugs: Secondary | ICD-10-CM | POA: Insufficient documentation

## 2022-01-10 DIAGNOSIS — N1831 Chronic kidney disease, stage 3a: Secondary | ICD-10-CM | POA: Diagnosis not present

## 2022-01-10 DIAGNOSIS — I160 Hypertensive urgency: Secondary | ICD-10-CM | POA: Insufficient documentation

## 2022-01-10 DIAGNOSIS — R41840 Attention and concentration deficit: Secondary | ICD-10-CM | POA: Insufficient documentation

## 2022-01-10 DIAGNOSIS — Z87891 Personal history of nicotine dependence: Secondary | ICD-10-CM | POA: Diagnosis not present

## 2022-01-10 DIAGNOSIS — R41842 Visuospatial deficit: Secondary | ICD-10-CM | POA: Diagnosis present

## 2022-01-10 DIAGNOSIS — Z91040 Latex allergy status: Secondary | ICD-10-CM | POA: Diagnosis not present

## 2022-01-10 DIAGNOSIS — I129 Hypertensive chronic kidney disease with stage 1 through stage 4 chronic kidney disease, or unspecified chronic kidney disease: Secondary | ICD-10-CM | POA: Diagnosis present

## 2022-01-10 DIAGNOSIS — M6281 Muscle weakness (generalized): Secondary | ICD-10-CM | POA: Insufficient documentation

## 2022-01-10 DIAGNOSIS — R278 Other lack of coordination: Secondary | ICD-10-CM | POA: Insufficient documentation

## 2022-01-10 LAB — BASIC METABOLIC PANEL
Anion gap: 11 (ref 5–15)
BUN: 20 mg/dL (ref 8–23)
CO2: 25 mmol/L (ref 22–32)
Calcium: 10.2 mg/dL (ref 8.9–10.3)
Chloride: 106 mmol/L (ref 98–111)
Creatinine, Ser: 1.44 mg/dL — ABNORMAL HIGH (ref 0.44–1.00)
GFR, Estimated: 38 mL/min — ABNORMAL LOW (ref >60)
Glucose, Bld: 116 mg/dL — ABNORMAL HIGH (ref 70–99)
Potassium: 4.4 mmol/L (ref 3.5–5.1)
Sodium: 142 mmol/L (ref 135–145)

## 2022-01-10 LAB — CBC
HCT: 36 % (ref 36.0–46.0)
Hemoglobin: 11.6 g/dL — ABNORMAL LOW (ref 12.0–15.0)
MCH: 31.6 pg (ref 26.0–34.0)
MCHC: 32.2 g/dL (ref 30.0–36.0)
MCV: 98.1 fL (ref 80.0–100.0)
Platelets: 186 10*3/uL (ref 150–400)
RBC: 3.67 MIL/uL — ABNORMAL LOW (ref 3.87–5.11)
RDW: 13.5 % (ref 11.5–15.5)
WBC: 8 10*3/uL (ref 4.0–10.5)
nRBC: 0 % (ref 0.0–0.2)

## 2022-01-10 LAB — TROPONIN I (HIGH SENSITIVITY): Troponin I (High Sensitivity): 8 ng/L (ref <18)

## 2022-01-10 NOTE — ED Notes (Signed)
RN reviewed discharge instructions with pt. Pt verbalized understanding and had no further questions. VSS upon discharge.  

## 2022-01-10 NOTE — Therapy (Signed)
OUTPATIENT OCCUPATIONAL THERAPY NEURO Treatment  Patient Name: Heather Gross MRN: [id number] DOB:08-05-1945, 76 y.o., female Today's Date: 01/10/2022  PCP:  [name] NP REFERRING PROVIDER: Dr. Louanne Belton   OT End of Session - 01/10/22 1214     Visit Number 3    Number of Visits 12    Date for OT Re-Evaluation 02/28/22    Authorization Type [employer]    Authorization - Visit Number 3    Progress Note Due on Visit 10    OT Start Time 1102    OT Stop Time 1142    OT Time Calculation (min) 40 min              Past Medical History:  Diagnosis Date   Glaucoma    Hyperlipidemia    Hypertension    Stroke [facility])    Past Surgical History:  Procedure Laterality Date   BREAST BIOPSY Right 09/29/2015   BREAST EXCISIONAL BIOPSY Right 11/04/2015   papilloma   BREAST LUMPECTOMY WITH RADIOACTIVE SEED LOCALIZATION Right 11/04/2015   Procedure: BREAST LUMPECTOMY WITH RADIOACTIVE SEED LOCALIZATION;  Surgeon: Autumn Messing III, MD;  Location: [place];  Service: General;  Laterality: Right;   LIPOMA EXCISION     Patient Active Problem List   Diagnosis Date Noted   Family history of malignant neoplasm of digestive organs 10/28/2021   Cerebrovascular disease 09/13/2021   Acute ischemic stroke ([place]) 09/01/2021   LBBB (left bundle branch block) 09/01/2021   Obesity (BMI 30-39.9) 12/30/2019   Stage 3a chronic kidney disease (CKD) (HCC) -Baseline Scr 1.3-1.6 02/05/2017   Osteopenia 11/02/2016   Vitamin D deficiency 12/10/2014   Hyperlipidemia 06/11/2014   Type 2 diabetes mellitus with diabetic chronic kidney disease ([place])    Glaucoma    Essential hypertension 06/28/2006   Gastroesophageal reflux disease 06/28/2006   ECZEMA, ATOPIC DERMATITIS 06/28/2006    ONSET DATE: 11/07/21  REFERRING DIAG: CVA  THERAPY DIAG:  Muscle weakness (generalized)  Unsteadiness on feet  Visuospatial deficit  Attention and concentration deficit  Frontal lobe and executive  function deficit  Other lack of coordination  Rationale for Evaluation and Treatment Rehabilitation  SUBJECTIVE:   SUBJECTIVE STATEMENT: Denies pain Pt accompanied by: husband  PERTINENT HISTORY: 76 yo female presenting to ED on 7/10 with diplopia. Recently came to ED on 7/8 for diplopia, MRI was ordered but wait was too long so pt left. 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   PRECAUTIONS: Fall  WEIGHT BEARING RESTRICTIONS No  PAIN:  Are you having pain? No  FALLS: Has patient fallen in last 6 months? No  LIVING ENVIRONMENT: Lives with: lives with their spouse Lives in: House/apartment Stairs: No Has following equipment at home: Single point cane  PLOF: Independent  PATIENT GOALS to get back to myself, to drive  OBJECTIVE:   VISION: Subjective report: Pt reports diplopia with fatigue Baseline vision: Wears glasses all the time Visual history: glaucoma per pt report  VISION ASSESSMENT: Impaired Eye alignment: WFL Tracking/Visual pursuits: Able to track stimulus in all quads without difficulty Visual Fields: no apparent deficits  Patient has difficulty with following activities due to following visual impairments: reading, sometimes too blurry   OBSERVATIONS: Pt requires increased time and v.c for commands   TODAY'S TREATMENT:  Pt was instructed in coordination HEP for LUE and memory compensations Copying small peg design with LUE for increased fine motor coordination and cognitive/ visual skills, min difficulty/ v.c PATIENT EDUCATION: PATIENT  EDUCATION: Education details: coordination HEP, memory compensations Person educated: Patient and Spouse Education method: Customer service manager, handpout issued Education comprehension: verbalized understanding, returned demonstration, and verbal cues required   HOME EXERCISE PROGRAM: 12/27/21- vision HEP 01/10/22 Coordination HEP, memory  compensations     GOALS:   SHORT TERM GOALS: Target date: 01/05/22   I with HEP for vision Baseline: Goal status: INITIAL  2.  I with HEP for LUE coordination Baseline:  Goal status: INITIAL  3.  Pt will verbalize understanding of compensatory strategies for short term memory/ cognitive deficits. Baseline:  Goal status: INITIAL  4.  Pt/ husband will verbalize understanding of AE recommendations for increased safety with shower transfers/ shower.(Ie: tub transfer bench) Baseline:  Goal status: INITIAL  5  LONG TERM GOALS: Target date: 02/28/22  Pt will perform basic cooking with supervision demonstrating good safety awareness. Baseline:  Goal status: INITIAL  2.  Pt will perform home management activities demonstrating good safety awareness. Baseline:  Goal status: INITIAL  3.  Pt report ability to consistently read newsprint without diplopia Baseline:  Goal status: INITIAL  ASSESSMENT:  CLINICAL IMPRESSION: Patient is  progressing towards goals. She demonstrates understanding of coordination HEP and memory compensations.   PERFORMANCE DEFICITS in functional skills including ADLs, IADLs, coordination, dexterity, FMC, mobility, balance, decreased knowledge of precautions, decreased knowledge of use of DME, vision, and UE functional use, cognitive skills including attention, learn, memory, problem solving, safety awareness, sequencing, thought, and understand, and psychosocial skills including coping strategies, environmental adaptation, habits, interpersonal interactions, and routines and behaviors.   IMPAIRMENTS are limiting patient from ADLs, IADLs, rest and sleep, play, leisure, and social participation.   COMORBIDITIES may have co-morbidities  that affects occupational performance. Patient will benefit from skilled OT to address above impairments and improve overall function.  MODIFICATION OR ASSISTANCE TO COMPLETE EVALUATION: Min-Moderate modification of tasks  or assist with assess necessary to complete an evaluation.  OT OCCUPATIONAL PROFILE AND HISTORY: Detailed assessment: Review of records and additional review of physical, cognitive, psychosocial history related to current functional performance.  CLINICAL DECISION MAKING: LOW - limited treatment options, no task modification necessary  REHAB POTENTIAL: Good  EVALUATION COMPLEXITY: Low    PLAN: OT FREQUENCY: 1x/week  OT DURATION: 12 weeks (anticipate d/c after 6 weeks)  PLANNED INTERVENTIONS: self care/ADL training, therapeutic exercise, therapeutic activity, neuromuscular re-education, manual therapy, gait training, balance training, functional mobility training, moist heat, cryotherapy, contrast bath, patient/family education, cognitive remediation/compensation, visual/perceptual remediation/compensation, coping strategies training, DME and/or AE instructions, and Re-evaluation  RECOMMENDED OTHER SERVICES: PT  CONSULTED AND AGREED WITH PLAN OF CARE: Patient and family member/caregiver  PLAN FOR NEXT SESSION:  simple kitchen task   Oceane Fosse, OT 01/10/2022, 12:15 PM

## 2022-01-10 NOTE — ED Provider Notes (Signed)
[place] EMERGENCY DEPARTMENT Provider Note  CSN: [id number] Arrival date & time: 01/10/22 1744  Chief Complaint(s) Hypertension  HPI Shareena Nusz is a 76 y.o. female with PMH glaucoma, HTN, HLD, previous CVA who presents emergency department for evaluation of asymptomatic hypertension.  Patient was working with physical therapy today when she was found to have systolics greater than 782 and despite having absolutely no symptoms associated with his blood pressure, the patient's primary care provider recommended that she come to the emergency department for further evaluation.  Here in the emergency department, patient continues to have no symptoms associated with her high blood pressure.  Denies chest pain, shortness of breath, abdominal pain, nausea, vomiting or other systemic symptoms.   Past Medical History Past Medical History:  Diagnosis Date   Glaucoma    Hyperlipidemia    Hypertension    Stroke [facility])    Patient Active Problem List   Diagnosis Date Noted   Family history of malignant neoplasm of digestive organs 10/28/2021   Cerebrovascular disease 09/13/2021   Acute ischemic stroke ([place]) 09/01/2021   LBBB (left bundle branch block) 09/01/2021   Obesity (BMI 30-39.9) 12/30/2019   Stage 3a chronic kidney disease (CKD) ([place]) -Baseline Scr 1.3-1.6 02/05/2017   Osteopenia 11/02/2016   Vitamin D deficiency 12/10/2014   Hyperlipidemia 06/11/2014   Type 2 diabetes mellitus with diabetic chronic kidney disease ([place])    Glaucoma    Essential hypertension 06/28/2006   Gastroesophageal reflux disease 06/28/2006   ECZEMA, ATOPIC DERMATITIS 06/28/2006   Home Medication(s) Prior to Admission medications   Medication Sig Start Date End Date Taking? Authorizing Provider  Calcium 500 MG CHEW Chew 2 tablets (1,000 mg total) by mouth daily. 12/13/16   Orlena Sheldon, PA-C  clopidogrel (PLAVIX) 75 MG tablet Take 1 tablet (75 mg total) by mouth daily. Start on December 11, 2021 12/11/21   Flora Lipps, MD  [employer] D3 2000 units CAPS TAKE 1 CAPSULE BY MOUTH EVERY DAY Patient taking differently: Take 2,000 Units by mouth daily after breakfast. 12/17/17   Orlena Sheldon, PA-C  ezetimibe (ZETIA) 10 MG tablet Take 10 mg by mouth daily. 09/14/21   [provider]  fluticasone (FLONASE) 50 MCG/ACT nasal spray Place 1-2 sprays into both nostrils daily as needed for allergies or rhinitis. 06/06/19   [provider]  metFORMIN (GLUCOPHAGE) 500 MG tablet TAKE 1 TABLET BY MOUTH AT BEDTIME. Patient taking differently: Take 500 mg by mouth daily with breakfast. 08/16/20   Alycia Rossetti, MD  Multiple Vitamin (MULTIVITAMIN) tablet Take 1 tablet by mouth daily with breakfast.    [provider]  Olmesartan-amLODIPine-HCTZ 40-5-25 MG TABS Take 1 tablet by mouth daily. 09/28/21   Skeet Latch, MD  pantoprazole (PROTONIX) 20 MG tablet Take 1 tablet (20 mg total) by mouth daily. Patient taking differently: Take 20 mg by mouth daily as needed for indigestion. 09/14/21 09/14/22  Danford, Suann Larry, MD  rosuvastatin (CRESTOR) 40 MG tablet Take 40 mg by mouth at bedtime. 09/08/21   [provider]  Past Surgical History Past Surgical History:  Procedure Laterality Date   BREAST BIOPSY Right 09/29/2015   BREAST EXCISIONAL BIOPSY Right 11/04/2015   papilloma   BREAST LUMPECTOMY WITH RADIOACTIVE SEED LOCALIZATION Right 11/04/2015   Procedure: BREAST LUMPECTOMY WITH RADIOACTIVE SEED LOCALIZATION;  Surgeon: Autumn Messing III, MD;  Location: [facility];  Service: General;  Laterality: Right;   LIPOMA EXCISION     Family History Family History  Problem Relation Age of Onset   Heart disease Father    Diabetes Sister    Breast cancer Sister    Diabetes Sister    Diabetes Sister    Diabetes Sister    Diabetes  Sister    Diabetes Brother     Social History Social History   Tobacco Use   Smoking status: Former    Types: Cigarettes    Quit date: 05/01/1990    Years since quitting: 31.7   Smokeless tobacco: Never  Substance Use Topics   Alcohol use: No   Drug use: No   Allergies Lipitor [atorvastatin], Shellfish-derived products, and Latex  Review of Systems Review of Systems  All other systems reviewed and are negative.   Physical Exam Vital Signs  I have reviewed the triage vital signs BP (!) 181/98   Pulse 84   Temp 98.2 F (36.8 C) (Oral)   Resp (!) 25   SpO2 98%   Physical Exam Vitals and nursing note reviewed.  Constitutional:      General: She is not in acute distress.    Appearance: She is well-developed.  HENT:     Head: Normocephalic and atraumatic.  Eyes:     Conjunctiva/sclera: Conjunctivae normal.  Cardiovascular:     Rate and Rhythm: Normal rate and regular rhythm.     Heart sounds: No murmur heard. Pulmonary:     Effort: Pulmonary effort is normal. No respiratory distress.     Breath sounds: Normal breath sounds.  Abdominal:     Palpations: Abdomen is soft.     Tenderness: There is no abdominal tenderness.  Musculoskeletal:        General: No swelling.     Cervical back: Neck supple.  Skin:    General: Skin is warm and dry.     Capillary Refill: Capillary refill takes less than 2 seconds.  Neurological:     Mental Status: She is alert.  Psychiatric:        Mood and Affect: Mood normal.     ED Results and Treatments Labs (all labs ordered are listed, but only abnormal results are displayed) Labs Reviewed  BASIC METABOLIC PANEL - Abnormal; Notable for the following components:      Result Value   Glucose, Bld 116 (*)    Creatinine, Ser 1.44 (*)    GFR, Estimated 38 (*)    All other components within normal limits  CBC - Abnormal; Notable for the following components:   RBC 3.67 (*)    Hemoglobin 11.6 (*)    All other components within  normal limits  TROPONIN I (HIGH SENSITIVITY)  TROPONIN I (HIGH SENSITIVITY)  Radiology DG Chest 2 View  Result Date: 01/10/2022 CLINICAL DATA:  Elevated blood pressure.  Indigestion earlier today. EXAM: CHEST - 2 VIEW COMPARISON:  None Available. FINDINGS: The heart size and mediastinal contours are normal. The lungs are clear. There is no pleural effusion or pneumothorax. No acute osseous findings are identified. Mild degenerative changes in the spine. IMPRESSION: No evidence of active cardiopulmonary process. Electronically Signed   By: Richardean Sale M.D.   On: 01/10/2022 18:57    Pertinent labs & imaging results that were available during my care of the patient were reviewed by me and considered in my medical decision making (see MDM for details).  Medications Ordered in ED Medications - No data to display                                                                                                                                   Procedures Procedures  (including critical care time)  Medical Decision Making / ED Course   This patient presents to the ED for concern of asymptomatic hypertension, this involves an extensive number of treatment options, and is a complaint that carries with it a high risk of complications and morbidity.  The differential diagnosis includes hypertensive urgency, hypertensive emergency, failure of outpatient blood pressure regimen  MDM: Patient seen the emergency room for evaluation of asymptomatic hypertension.  Laboratory evaluation with a creatinine of 1.44 which is improved from baseline, hemoglobin 11.6 which is baseline.  Nonischemic, chest x-ray normal.  Patient currently showing no evidence of endorgan damage and repeat blood pressure 180/98.  Patient is due for her nighttime blood pressure medicines and she was encouraged to go  home and take these medicines.  Current literature suggest that aggressive blood pressure management in the absence of symptoms in the emergency department leads to worse outcomes and thus, she will be discharged with outpatient PCP follow-up of her blood pressure.  Additional history obtained: -Additional history obtained from husband -External records from outside source obtained and reviewed including: Chart review including previous notes, labs, imaging, consultation notes   Lab Tests: -I ordered, reviewed, and interpreted labs.   The pertinent results include:   Labs Reviewed  BASIC METABOLIC PANEL - Abnormal; Notable for the following components:      Result Value   Glucose, Bld 116 (*)    Creatinine, Ser 1.44 (*)    GFR, Estimated 38 (*)    All other components within normal limits  CBC - Abnormal; Notable for the following components:   RBC 3.67 (*)    Hemoglobin 11.6 (*)    All other components within normal limits  TROPONIN I (HIGH SENSITIVITY)  TROPONIN I (HIGH SENSITIVITY)      EKG   EKG Interpretation  Date/Time:  Tuesday January 10 2022 18:30:06 EDT Ventricular Rate:  103 PR Interval:  154 QRS Duration: 70 QT Interval:  362 QTC Calculation: 474 R Axis:  43 Text Interpretation: Sinus tachycardia When compared with ECG of 13-Sep-2021 18:35, PREVIOUS ECG IS PRESENT Confirmed by Chariah Bailey ([id number]) on 01/10/2022 8:14:39 PM         Imaging Studies ordered: I ordered imaging studies including chest x-ray I independently visualized and interpreted imaging. I agree with the radiologist interpretation   Medicines ordered and prescription drug management: No orders of the defined types were placed in this encounter.   -I have reviewed the patients home medicines and have made adjustments as needed  Critical interventions none    Cardiac Monitoring: The patient was maintained on a cardiac monitor.  I personally viewed and interpreted the cardiac  monitored which showed an underlying rhythm of: NSR  Social Determinants of Health:  Factors impacting patients care include: none   Reevaluation: After the interventions noted above, I reevaluated the patient and found that they have :improved  Co morbidities that complicate the patient evaluation  Past Medical History:  Diagnosis Date   Glaucoma    Hyperlipidemia    Hypertension    Stroke [facility])       Dispostion: I considered admission for this patient, but patient does not meet inpatient criteria for admission is safe for discharge with outpatient follow-up     Final Clinical Impression(s) / ED Diagnoses Final diagnoses:  Hypertensive urgency     '@PCDICTATION'$ @    Teressa Lower, MD 01/10/22 2045

## 2022-01-10 NOTE — Patient Instructions (Signed)
  Coordination Activities  Perform the following activities for 20 minutes 1 times per day with left hand(s).  Rotate ball in fingertips (clockwise and counter-clockwise). Toss ball between hands. Toss ball in air and catch with the same hand. Flip cards 1 at a time as fast as you can. Deal cards with your thumb (Hold deck in hand and push card off top with thumb). Pick up coins, buttons, marbles, dried beans/pasta of different sizes and place in container. Pick up coins and place in container or coin bank. Pick up coins and stack. Pick up coins one at a time until you get 5-10 in your hand, then move coins from palm to fingertips to stack one at a time.      Memory Compensation Strategies  Use "WARM" strategy. W= write it down A=  associate it R=  repeat it M=  make a mental picture  You can keep a Memory Notebook. Use a 3-ring notebook with sections for the following:  calendar, important names and phone numbers, medications, doctors' names/phone numbers, "to do list"/reminders, and a section to journal what you did each day  Use a calendar to write appointments down.  Write yourself a schedule for the day.  This can be placed on the calendar or in a separate section of the Memory Notebook.  Keeping a regular schedule can help memory.  Use medication organizer with sections for each day or morning/evening pills  You may need help loading it  Keep a basket, or pegboard by the door.   Place items that you need to take out with you in the basket or on the pegboard.  You may also want to include a message board for reminders.  Use sticky notes. Place sticky notes with reminders in a place where the task is performed.  For example:  "turn off the stove" placed by the stove, "lock the door" placed on the door at eye level, "take your medications" on the bathroom mirror or by the place where you normally take your medications  Use alarms, timers, and/or a reminder app. Use  while cooking to remind yourself to check on food or as a reminder to take your medicine, or as a reminder to make a call, or as a reminder to perform another task, etc.

## 2022-01-10 NOTE — ED Triage Notes (Signed)
Patient referred to ED by physical therapist after blood pressure reading was high today. Patient has no complaints at this time, reports feeling some indigestion earlier today. Patient is alert, oriented, and in no apparent distress.

## 2022-01-10 NOTE — Therapy (Signed)
OUTPATIENT PHYSICAL THERAPY NEURO TREATMENT   Patient Name: Heather Gross MRN: [id number] DOB:08/25/1945, 76 y.o., female Today's Date: 01/10/2022   PCP: [name], NP REFERRING PROVIDER: Flora Lipps, MD    PT End of Session - 01/10/22 1017     Visit Number 3    Number of Visits 7   with eval   Date for PT Re-Evaluation 01/17/22    Authorization Type [employer]    PT Start Time 1016    PT Stop Time 1100    PT Time Calculation (min) 44 min    Equipment Utilized During Treatment Gait belt    Activity Tolerance Patient tolerated treatment well    Behavior During Therapy WFL for tasks assessed/performed               Past Medical History:  Diagnosis Date   Glaucoma    Hyperlipidemia    Hypertension    Stroke [facility])    Past Surgical History:  Procedure Laterality Date   BREAST BIOPSY Right 09/29/2015   BREAST EXCISIONAL BIOPSY Right 11/04/2015   papilloma   BREAST LUMPECTOMY WITH RADIOACTIVE SEED LOCALIZATION Right 11/04/2015   Procedure: BREAST LUMPECTOMY WITH RADIOACTIVE SEED LOCALIZATION;  Surgeon: Autumn Messing III, MD;  Location: [facility];  Service: General;  Laterality: Right;   LIPOMA EXCISION     Patient Active Problem List   Diagnosis Date Noted   Family history of malignant neoplasm of digestive organs 10/28/2021   Cerebrovascular disease 09/13/2021   Acute ischemic stroke ([place]) 09/01/2021   LBBB (left bundle branch block) 09/01/2021   Obesity (BMI 30-39.9) 12/30/2019   Stage 3a chronic kidney disease (CKD) ([place]) -Baseline Scr 1.3-1.6 02/05/2017   Osteopenia 11/02/2016   Vitamin D deficiency 12/10/2014   Hyperlipidemia 06/11/2014   Type 2 diabetes mellitus with diabetic chronic kidney disease ([place])    Glaucoma    Essential hypertension 06/28/2006   Gastroesophageal reflux disease 06/28/2006   ECZEMA, ATOPIC DERMATITIS 06/28/2006    ONSET DATE: 11/09/2021   REFERRING DIAG: I63.9 (ICD-10-CM) - Acute ischemic stroke (H[facility])    THERAPY DIAG:  Muscle weakness (generalized)  Unsteadiness on feet  Other abnormalities of gait and mobility  Rationale for Evaluation and Treatment Rehabilitation  SUBJECTIVE:                                                                                                                                                                                              SUBJECTIVE STATEMENT: No fall or near falls since last time. Pt reports she has been doing her HEP, still feels that it is a a good challenge.  No pain this AM. Pt's husband has noticed that she is "moving better", pt reports she feels that her balance is still her greatest challenge.  Pt reports she took her BP this AM and it was "154/?", then she took her BP meds.  Pt accompanied by: self and significant other husband [name]  PERTINENT HISTORY: hypertension, hyperlipidemia, CKD 3, LBBB, obesity, and recent stroke.   PAIN:  Are you having pain? No  PRECAUTIONS: Fall   PATIENT GOALS "to get stronger"  OBJECTIVE:   TODAY'S TREATMENT:   THER ACT: BP assessed several times throughout session due to elevated readings this AM:  179/104 175/87 Then as below:  Vitals:   01/10/22 1036 01/10/22 1041  BP: (!) 182/86 (!) 165/90   Vitals:   01/10/22 1036 01/10/22 1041  BP: (!) 182/86 (!) 165/90   Final BP taken manually. Educated pt on importance of monitoring BP and that she should reach out to her PCP regarding a trend of elevated BP over the past few weeks. Provided education about importance of keeping BP under control to decrease stress on blood vessels/arteries and to prevent another stroke.   GAIT:  GAIT: Gait pattern: decreased arm swing- Left, decreased ankle dorsiflexion- Right, decreased ankle dorsiflexion- Left, and shuffling Distance walked: 115 ft Assistive device utilized: Quad cane small base Level of assistance: Modified independence Comments: gait with "hurricane", pt tends to just carry cane  with her and occasionally use for balance as needed, increased speed as compared to no AD  Gait pattern: decreased arm swing- Left, decreased step length- Right, decreased step length- Left, decreased hip/knee flexion- Left, decreased ankle dorsiflexion- Right, decreased ankle dorsiflexion- Left, and shuffling Distance walked: 115 ft Assistive device utilized: None Level of assistance: CGA Comments: decreased speed without use of AD due to decreased confidence, shuffling gait, decreased BLE clearance  Agility ladder step-through with focus on increasing step length and LE clearance. Pt initially requires min HHA for balance but progresses to CGA for balance with no UE support. Ambulation around therapy gym following practice with agility ladder with focus on increasing step length and LE clearance during gait, good carryover. Pt also exhibits increased gait speed with increase in step length.   PATIENT EDUCATION: Education details: continue HEP, work on increasing step length and clearance with walks in the park that has been doing, continue to monitor BP and reach out to PCP about elevated BP Person educated: Patient and Spouse Education method: Explanation Education comprehension: verbalized understanding   HOME EXERCISE PROGRAM: Access Code: [email or web address] URL: [email or web address] Date: 12/27/2021 Prepared by: Excell Seltzer  Exercises - Alternating Step Taps with Counter Support  - 1 x daily - 7 x weekly - 3 sets - 10 reps - Side Stepping with Resistance at Ankles and Counter Support  - 1 x daily - 7 x weekly - 1 sets - 10 reps - Tandem Walking with Counter Support  - 1 x daily - 7 x weekly - 1 sets - 10 reps   GOALS: Goals reviewed with patient? Yes  SHORT TERM GOALS: Target date: 12/27/2021  Initial HEP to be established Baseline: initiated 8/29 Goal status: MET  2.  Pt will improve Berg score to 42/56 for decreased fall risk Baseline: 38/56 (8/8), 43/56  (8/29) Goal status: MET  3.  Pt will improve gait velocity to at least 2.5 ft/sec for improved gait efficiency and performance at Supervision level  Baseline: 2.26 ft/sec (8/8), 2.15 ft/sec (8/29) Goal status: IN PROGRESS   LONG  TERM GOALS: Target date: 01/17/2022  Pt will be independent with final balance HEP for improved strength, balance, transfers and gait. Baseline:  Goal status: INITIAL  2.  Pt will improve Berg score to 46/56 for decreased fall risk Baseline: 38/56 (8/8), 43/56 (8/29) Goal status: INITIAL  3.  Pt will improve gait velocity to at least 3.0 ft/sec for improved gait efficiency and performance at Supervision level   Baseline: 2.26 ft/sec (8/8), 2.15 ft/sec (8/29) Goal status: INITIAL   ASSESSMENT:  CLINICAL IMPRESSION: Emphasis of skilled PT session on continuing to assess pt's BP as it remains elevated this date. Provided handout for BP ranges and educated pt and husband that neurologist goal is for patient to keep her BP <130/90. Educated pt and her husband on importance of monitoring her BP and to reach out to her PCP about trend of elevated BP over the past few weeks. Remainder of session focus on improving gait mechanics as pt tends to ambulate with a shuffling gait pattern. Pt exhibits good carryover of work on agility ladder on increasing step length and LE clearance to gait in the clinic. Pt continues to benefit from skilled therapy services to address ongoing balance and gait impairments as well as L inattention. Continue POC.   OBJECTIVE IMPAIRMENTS Abnormal gait, decreased balance, decreased safety awareness, and impaired vision/preception.   ACTIVITY LIMITATIONS carrying, lifting, bending, squatting, stairs, and locomotion level  PARTICIPATION LIMITATIONS: meal prep, cleaning, laundry, interpersonal relationship, driving, shopping, and community activity  PERSONAL FACTORS Age and 1-2 comorbidities:   hypertension, hyperlipidemia, CKD 3, LBBB, obesity,  and recent stroke. are also affecting patient's functional outcome.   REHAB POTENTIAL: Good  CLINICAL DECISION MAKING: Stable/uncomplicated  EVALUATION COMPLEXITY: Low  PLAN: PT FREQUENCY: 1x/week  PT DURATION: 6 weeks  PLANNED INTERVENTIONS: Therapeutic exercises, Therapeutic activity, Neuromuscular re-education, Balance training, Gait training, Patient/Family education, Self Care, Joint mobilization, Stair training, DME instructions, Cryotherapy, Moist heat, Manual therapy, and Re-evaluation  PLAN FOR NEXT SESSION: add to HEP for balance (tandem, SLS, step-overs), work on L inattention, wants to work towards decreased AD reliance, recert next visit (assess LTG and write new goals)   Excell Seltzer, PT, DPT, CSRS 01/10/2022, 11:01 AM

## 2022-01-10 NOTE — ED Provider Triage Note (Signed)
Emergency Medicine Provider Triage Evaluation Note  Heather Gross , a 76 y.o. female  was evaluated in triage.  Pt complains of elevated BP. Went to therapy today, BP checked and found to be high. Called PCP who told her to go to the ER. States she feels fine but has some indigestion which just started a few minutes ago while in the ER.  Review of Systems  Positive: indigestion Negative: SHOB, headache, vision changes, weakness, numbness.   Physical Exam  BP (!) 192/112 (BP Location: Right Arm)   Pulse 87   Temp 98.2 F (36.8 C) (Oral)   Resp 16   SpO2 96%  Gen:   Awake, no distress   Resp:  Normal effort  MSK:   Moves extremities without difficulty  Other:    Medical Decision Making  Medically screening exam initiated at 6:17 PM.  Appropriate orders placed.  Heather Gross was informed that the remainder of the evaluation will be completed by another provider, this initial triage assessment does not replace that evaluation, and the importance of remaining in the ED until their evaluation is complete.     Tacy Learn, PA-C 01/10/22 1818

## 2022-01-17 ENCOUNTER — Ambulatory Visit: Payer: Medicare HMO | Admitting: Occupational Therapy

## 2022-01-17 ENCOUNTER — Ambulatory Visit: Payer: Medicare HMO | Admitting: Physical Therapy

## 2022-01-17 VITALS — BP 177/85 | HR 90

## 2022-01-17 DIAGNOSIS — M6281 Muscle weakness (generalized): Secondary | ICD-10-CM

## 2022-01-17 DIAGNOSIS — R2681 Unsteadiness on feet: Secondary | ICD-10-CM

## 2022-01-17 DIAGNOSIS — R278 Other lack of coordination: Secondary | ICD-10-CM

## 2022-01-17 DIAGNOSIS — R2689 Other abnormalities of gait and mobility: Secondary | ICD-10-CM

## 2022-01-17 DIAGNOSIS — R41840 Attention and concentration deficit: Secondary | ICD-10-CM

## 2022-01-17 DIAGNOSIS — R41842 Visuospatial deficit: Secondary | ICD-10-CM

## 2022-01-17 DIAGNOSIS — R41844 Frontal lobe and executive function deficit: Secondary | ICD-10-CM

## 2022-01-17 NOTE — Therapy (Signed)
OUTPATIENT PHYSICAL THERAPY NEURO TREATMENT-RECERT   Patient Name: Heather Gross MRN: [id number] DOB:1945/11/07, 76 y.o., female Today's Date: 01/17/2022   PCP: [name], NP REFERRING PROVIDER: Flora Lipps, MD    PT End of Session - 01/17/22 1021     Visit Number 4    Number of Visits 7   with eval   Date for PT Re-Evaluation 81/15/72   recert, to allow for scheduling delays   Authorization Type [employer]    PT Start Time 1016    PT Stop Time 1100    PT Time Calculation (min) 44 min    Equipment Utilized During Treatment Gait belt    Activity Tolerance Patient tolerated treatment well    Behavior During Therapy WFL for tasks assessed/performed                Past Medical History:  Diagnosis Date   Glaucoma    Hyperlipidemia    Hypertension    Stroke [facility])    Past Surgical History:  Procedure Laterality Date   BREAST BIOPSY Right 09/29/2015   BREAST EXCISIONAL BIOPSY Right 11/04/2015   papilloma   BREAST LUMPECTOMY WITH RADIOACTIVE SEED LOCALIZATION Right 11/04/2015   Procedure: BREAST LUMPECTOMY WITH RADIOACTIVE SEED LOCALIZATION;  Surgeon: Autumn Messing III, MD;  Location: [place];  Service: General;  Laterality: Right;   LIPOMA EXCISION     Patient Active Problem List   Diagnosis Date Noted   Family history of malignant neoplasm of digestive organs 10/28/2021   Cerebrovascular disease 09/13/2021   Acute ischemic stroke ([place]) 09/01/2021   LBBB (left bundle branch block) 09/01/2021   Obesity (BMI 30-39.9) 12/30/2019   Stage 3a chronic kidney disease (CKD) ([place]) -Baseline Scr 1.3-1.6 02/05/2017   Osteopenia 11/02/2016   Vitamin D deficiency 12/10/2014   Hyperlipidemia 06/11/2014   Type 2 diabetes mellitus with diabetic chronic kidney disease ([place])    Glaucoma    Essential hypertension 06/28/2006   Gastroesophageal reflux disease 06/28/2006   ECZEMA, ATOPIC DERMATITIS 06/28/2006    ONSET DATE: 11/09/2021   REFERRING DIAG:  I63.9 (ICD-10-CM) - Acute ischemic stroke (HCC)   THERAPY DIAG:  Muscle weakness (generalized)  Unsteadiness on feet  Other abnormalities of gait and mobility  Rationale for Evaluation and Treatment Rehabilitation  SUBJECTIVE:                                                                                                                                                                                              SUBJECTIVE STATEMENT: Pt reports she went to ED after last visit after speaking with PCP due to elevated BP.  Pt reports she believes her BP is still running high, in 180s. Assessed BP, see below. Pt's husband encouraged to reach out to her PCP after session as pt reports she received a call from her doctor's office about changing her medication but did not pass the information along to her husband and is unable to recall what the changes were.  Pt accompanied by: self and significant other husband [name]  PERTINENT HISTORY: hypertension, hyperlipidemia, CKD 3, LBBB, obesity, and recent stroke.   PAIN:  Are you having pain? No  PRECAUTIONS: Fall   PATIENT GOALS "to get stronger"  OBJECTIVE:   TODAY'S TREATMENT:   THER ACT: BP assessed during session due to history of hypertension: Vitals:   01/17/22 1023  BP: (!) 177/85  Pulse: 90   Again educated pt on importance of monitoring BP and that she/her husband should reach out to her PCP regarding a trend of elevated BP over the past few weeks. Provided education about importance of keeping BP under control to decrease stress on blood vessels/arteries and to prevent another stroke.    [facility] PT Assessment - 01/17/22 1032       Ambulation/Gait   Gait velocity 32.8 ft over 17.36 sec = 1.89 ft/sec      Standardized Balance Assessment   Standardized Balance Assessment Berg Balance Test      Berg Balance Test   Sit to Stand Able to stand without using hands and stabilize independently    Standing Unsupported Able to  stand safely 2 minutes    Sitting with Back Unsupported but Feet Supported on Floor or Stool Able to sit safely and securely 2 minutes    Stand to Sit Sits safely with minimal use of hands    Transfers Able to transfer safely, minor use of hands    Standing Unsupported with Eyes Closed Able to stand 10 seconds safely    Standing Unsupported with Feet Together Able to place feet together independently and stand 1 minute safely    From Standing, Reach Forward with Outstretched Arm Can reach forward >12 cm safely (5")    From Standing Position, Pick up Object from Floor Able to pick up shoe, needs supervision    From Standing Position, Turn to Look Behind Over each Shoulder Looks behind from both sides and weight shifts well    Turn 360 Degrees Able to turn 360 degrees safely in 4 seconds or less    Standing Unsupported, Alternately Place Feet on Step/Stool Able to complete >2 steps/needs minimal assist    Standing Unsupported, One Foot in Front Able to take small step independently and hold 30 seconds    Standing on One Leg Able to lift leg independently and hold equal to or more than 3 seconds    Total Score 47    Berg comment: 47/56, moderate fall risk      Functional Gait  Assessment   Gait assessed  Yes    Gait Level Surface Walks 20 ft in less than 7 sec but greater than 5.5 sec, uses assistive device, slower speed, mild gait deviations, or deviates 6-10 in outside of the 12 in walkway width.    Change in Gait Speed Makes only minor adjustments to walking speed, or accomplishes a change in speed with significant gait deviations, deviates 10-15 in outside the 12 in walkway width, or changes speed but loses balance but is able to recover and continue walking.    Gait with Horizontal Head Turns Performs head turns smoothly  with slight change in gait velocity (eg, minor disruption to smooth gait path), deviates 6-10 in outside 12 in walkway width, or uses an assistive device.    Gait with  Vertical Head Turns Performs task with slight change in gait velocity (eg, minor disruption to smooth gait path), deviates 6 - 10 in outside 12 in walkway width or uses assistive device    Gait and Pivot Turn Turns slowly, requires verbal cueing, or requires several small steps to catch balance following turn and stop    Step Over Obstacle Is able to step over one shoe box (4.5 in total height) but must slow down and adjust steps to clear box safely. May require verbal cueing.    Gait with Narrow Base of Support Ambulates less than 4 steps heel to toe or cannot perform without assistance.    Gait with Eyes Closed Cannot walk 20 ft without assistance, severe gait deviations or imbalance, deviates greater than 15 in outside 12 in walkway width or will not attempt task.    Ambulating Backwards Walks 20 ft, slow speed, abnormal gait pattern, evidence for imbalance, deviates 10-15 in outside 12 in walkway width.    Steps Alternating feet, must use rail.    Total Score 12    FGA comment: 12/30, high fall risk            Added to HEP, see bolded below   PATIENT EDUCATION: Education details: continue HEP, work on increasing step length and clearance with walks in the park that has been doing, continue to monitor BP and reach out to PCP about elevated BP, results of OM and PT POC Person educated: Patient and Spouse Education method: Explanation and Handouts Education comprehension: verbalized understanding   HOME EXERCISE PROGRAM: Access Code: [email or web address] URL: [email or web address] Date: 12/27/2021 Prepared by: Excell Seltzer  Exercises - Alternating Step Taps with Counter Support  - 1 x daily - 7 x weekly - 3 sets - 10 reps - Side Stepping with Resistance at Ankles and Counter Support  - 1 x daily - 7 x weekly - 1 sets - 10 reps - Tandem Walking with Counter Support  - 1 x daily - 7 x weekly - 1 sets - 10 reps - Step-Over  - 1 x daily - 7 x weekly - 3 sets - 10 reps - Standing  Single Leg Stance with Counter Support  - 1 x daily - 7 x weekly - 1 sets - 5 reps - 30 sec hold   GOALS: Goals reviewed with patient? Yes  SHORT TERM GOALS: Target date: 12/27/2021  Initial HEP to be established Baseline: initiated 8/29 Goal status: MET  2.  Pt will improve Berg score to 42/56 for decreased fall risk Baseline: 38/56 (8/8), 43/56 (8/29) Goal status: MET  3.  Pt will improve gait velocity to at least 2.5 ft/sec for improved gait efficiency and performance at Supervision level  Baseline: 2.26 ft/sec (8/8), 2.15 ft/sec (8/29) Goal status: IN PROGRESS   LONG TERM GOALS: Target date: 01/17/2022  Pt will be independent with final balance HEP for improved strength, balance, transfers and gait. Baseline:  Goal status: IN PROGRESS  2.  Pt will improve Berg score to 46/56 for decreased fall risk Baseline: 38/56 (8/8), 43/56 (8/29), 47/56 (9/19) Goal status: MET  3.  Pt will improve gait velocity to at least 3.0 ft/sec for improved gait efficiency and performance at Supervision level   Baseline: 2.26 ft/sec (8/8), 2.15 ft/sec (8/29), 1.89 ft/sec (9/19) Goal  status: NOT MET  NEW LONG TERM GOALS: Target date: 02/14/2022  Pt will be independent with final balance HEP for improved strength, balance, transfers and gait. Baseline:  Goal status: IN PROGRESS  2.  Pt will improve FGA to 18/30 for decreased fall risk  Baseline: 12/30 (9/19) Goal status: INITIAL  3.  Pt will improve gait velocity to at least 2.5 ft/sec for improved gait efficiency and performance at Supervision level   Baseline: 2.26 ft/sec (8/8), 2.15 ft/sec (8/29), 1.89 ft/sec (9/19) Goal status: REVISED/UPDATED    ASSESSMENT:  CLINICAL IMPRESSION: Emphasis of skilled PT session on reassessing current LTG, discussing PT POC and creating new LTG, discussing BP management, and adding to HEP. Pt has only met 1/3 LTG due to therapy sessions being limited by ongoing hypertension. Pt does exhibit improved  balance with improvement in her Berg score from 38/56 on initial evaluation (8/8) to 47/56 this date which demonstrates an improvement from "significant fall risk to "moderate" fall risk. Pt exhibits decreased gait speed from 2.26 ft/sec initially to 1.89 ft/sec this date, indicating an increase in her fall risk and decreased safety and independence with functional mobility. Pt is not currently independent with her final HEP due to exercises being added this date to continue to work on patient's balance and improve her gait mechanics to decrease fall risk. Pt initially certified for 7 PT visits but was unable to complete that number of visits in the originally requested amount of time (7 weeks), requesting recertification for an additional 4 weeks to complete patient's 3 remaining originally requested visits and to allow for scheduling conflicts. (1x/week visits). Additionally, performed the FGA today on which the patient scored 12/30, indicating a high fall risk. Pt continues to benefit from skilled therapy services to address ongoing balance and gait impairments, decreased safety and independence with functional mobility, and in order to decrease her fall risk. Continue POC.   OBJECTIVE IMPAIRMENTS Abnormal gait, decreased balance, decreased safety awareness, and impaired vision/preception.   ACTIVITY LIMITATIONS carrying, lifting, bending, squatting, stairs, and locomotion level  PARTICIPATION LIMITATIONS: meal prep, cleaning, laundry, interpersonal relationship, driving, shopping, and community activity  PERSONAL FACTORS Age and 1-2 comorbidities:   hypertension, hyperlipidemia, CKD 3, LBBB, obesity, and recent stroke. are also affecting patient's functional outcome.   REHAB POTENTIAL: Good  CLINICAL DECISION MAKING: Stable/uncomplicated  EVALUATION COMPLEXITY: Low  PLAN: PT FREQUENCY: 1x/week  PT DURATION: 6 weeks+ 4 weeks (recert)  PLANNED INTERVENTIONS: Therapeutic exercises, Therapeutic  activity, Neuromuscular re-education, Balance training, Gait training, Patient/Family education, Self Care, Joint mobilization, Stair training, DME instructions, Cryotherapy, Moist heat, Manual therapy, and Re-evaluation  PLAN FOR NEXT SESSION: add to HEP, work on L inattention, wants to work towards decreased AD reliance, monster walks, dot/ladder drills, mini-squats, gait on compliant surfaces, obstacle course, monitor BP   Excell Seltzer, PT, DPT, CSRS 01/17/2022, 6:39 PM

## 2022-01-17 NOTE — Therapy (Signed)
OUTPATIENT OCCUPATIONAL THERAPY NEURO Treatment  Patient Name: Heather Gross MRN: [id number] DOB:March 19, 1946, 76 y.o., female Today's Date: 01/17/2022  PCP:  [name] NP REFERRING PROVIDER: Dr. Louanne Belton      Past Medical History:  Diagnosis Date   Glaucoma    Hyperlipidemia    Hypertension    Stroke [facility])    Past Surgical History:  Procedure Laterality Date   BREAST BIOPSY Right 09/29/2015   BREAST EXCISIONAL BIOPSY Right 11/04/2015   papilloma   BREAST LUMPECTOMY WITH RADIOACTIVE SEED LOCALIZATION Right 11/04/2015   Procedure: BREAST LUMPECTOMY WITH RADIOACTIVE SEED LOCALIZATION;  Surgeon: Autumn Messing III, MD;  Location: [place];  Service: General;  Laterality: Right;   LIPOMA EXCISION     Patient Active Problem List   Diagnosis Date Noted   Family history of malignant neoplasm of digestive organs 10/28/2021   Cerebrovascular disease 09/13/2021   Acute ischemic stroke ([place]) 09/01/2021   LBBB (left bundle branch block) 09/01/2021   Obesity (BMI 30-39.9) 12/30/2019   Stage 3a chronic kidney disease (CKD) ([place]) -Baseline Scr 1.3-1.6 02/05/2017   Osteopenia 11/02/2016   Vitamin D deficiency 12/10/2014   Hyperlipidemia 06/11/2014   Type 2 diabetes mellitus with diabetic chronic kidney disease ([place])    Glaucoma    Essential hypertension 06/28/2006   Gastroesophageal reflux disease 06/28/2006   ECZEMA, ATOPIC DERMATITIS 06/28/2006    ONSET DATE: 11/07/21  REFERRING DIAG: CVA  THERAPY DIAG:  No diagnosis found.  Rationale for Evaluation and Treatment Rehabilitation  SUBJECTIVE:   SUBJECTIVE STATEMENT: Denies pain Pt accompanied by: husband  PERTINENT HISTORY: 76 yo female presenting to ED on 7/10 with diplopia. Recently came to ED on 7/8 for diplopia, MRI was ordered but wait was too long so pt left. 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   PRECAUTIONS: Fall  WEIGHT BEARING RESTRICTIONS No  PAIN:   Are you having pain? No  FALLS: Has patient fallen in last 6 months? No  LIVING ENVIRONMENT: Lives with: lives with their spouse Lives in: House/apartment Stairs: No Has following equipment at home: Single point cane  PLOF: Independent  PATIENT GOALS to get back to myself, to drive  OBJECTIVE:   VISION: Subjective report: Pt reports diplopia with fatigue Baseline vision: Wears glasses all the time Visual history: glaucoma per pt report  VISION ASSESSMENT: Impaired Eye alignment: WFL Tracking/Visual pursuits: Able to track stimulus in all quads without difficulty Visual Fields: no apparent deficits  Patient has difficulty with following activities due to following visual impairments: reading, sometimes too blurry   OBSERVATIONS: Pt requires increased time and v.c for commands   TODAY'S TREATMENT:  Simple cooking task to locate items then scramble an egg, min-mod questioning cues. Pt required min-mod questioning cues to locate items, and for safety. Pt had butter in the pan and it was turned up too high and pt stepped away to get something. Therapist cued pt turned stove down. Pt cooked the egg safely and turned off the stove. Therapist cued  pt.to leave hot pan on the stove to cool.  Graded clothespins for sustained pinch, 1-8# with LUE, min v.c  Grooved pegs with LUE for increased fine motor coordination, min-mod difficulty/ v.c BP remains elevated, therapist recommends pt calls PCP.  HOME EXERCISE PROGRAM: 12/27/21- vision HEP 01/10/22 Coordination HEP, memory compensations     GOALS:   SHORT TERM GOALS: Target date: 01/05/22   I with HEP for vision Baseline: Goal status: INITIAL  2.  I with HEP for LUE coordination Baseline:  Goal status: INITIAL  3.  Pt will verbalize understanding of compensatory strategies for short term memory/ cognitive deficits. Baseline:  Goal status: INITIAL  4.  Pt/ husband will verbalize understanding of AE recommendations  for increased safety with shower transfers/ shower.(Ie: tub transfer bench) Baseline:  Goal status: INITIAL  5  LONG TERM GOALS: Target date: 02/28/22  Pt will perform basic cooking with supervision demonstrating good safety awareness. Baseline:  Goal status: INITIAL  2.  Pt will perform home management activities demonstrating good safety awareness. Baseline:  Goal status: INITIAL  3.  Pt report ability to consistently read newsprint without diplopia Baseline:  Goal status: INITIAL  ASSESSMENT:  CLINICAL IMPRESSION: Patient is  progressing towards goals. She will benefit from additional practice of simple cooking tasks at home  with assistance/ supervision from her husband for safety.  PERFORMANCE DEFICITS in functional skills including ADLs, IADLs, coordination, dexterity, FMC, mobility, balance, decreased knowledge of precautions, decreased knowledge of use of DME, vision, and UE functional use, cognitive skills including attention, learn, memory, problem solving, safety awareness, sequencing, thought, and understand, and psychosocial skills including coping strategies, environmental adaptation, habits, interpersonal interactions, and routines and behaviors.   IMPAIRMENTS are limiting patient from ADLs, IADLs, rest and sleep, play, leisure, and social participation.   COMORBIDITIES may have co-morbidities  that affects occupational performance. Patient will benefit from skilled OT to address above impairments and improve overall function.  MODIFICATION OR ASSISTANCE TO COMPLETE EVALUATION: Min-Moderate modification of tasks or assist with assess necessary to complete an evaluation.  OT OCCUPATIONAL PROFILE AND HISTORY: Detailed assessment: Review of records and additional review of physical, cognitive, psychosocial history related to current functional performance.  CLINICAL DECISION MAKING: LOW - limited treatment options, no task modification necessary  REHAB POTENTIAL:  Good  EVALUATION COMPLEXITY: Low    PLAN: OT FREQUENCY: 1x/week  OT DURATION: 12 weeks (anticipate d/c after 6 weeks)  PLANNED INTERVENTIONS: self care/ADL training, therapeutic exercise, therapeutic activity, neuromuscular re-education, manual therapy, gait training, balance training, functional mobility training, moist heat, cryotherapy, contrast bath, patient/family education, cognitive remediation/compensation, visual/perceptual remediation/compensation, coping strategies training, DME and/or AE instructions, and Re-evaluation  RECOMMENDED OTHER SERVICES: PT  CONSULTED AND AGREED WITH PLAN OF CARE: Patient and family member/caregiver  PLAN FOR NEXT SESSION: monitor BP,coordination, visual perceptual skills,   Stillman Buenger, OT 01/17/2022, 7:34 AM

## 2022-01-23 NOTE — Therapy (Incomplete)
OUTPATIENT OCCUPATIONAL THERAPY NEURO Treatment  Patient Name: Heather Gross MRN: [id number] DOB:1945/12/10, 76 y.o., female Today's Date: 01/24/2022  PCP:  [name] NP REFERRING PROVIDER: Dr. Louanne Belton   OT End of Session - 01/24/22 1031     Visit Number 5    Number of Visits 12    Date for OT Re-Evaluation 02/28/22    Authorization Type [employer]    Authorization - Visit Number 5    Progress Note Due on Visit 10    OT Start Time 1103    OT Stop Time 1145    OT Time Calculation (min) 42 min    Activity Tolerance Patient tolerated treatment well    Behavior During Therapy [facility] for tasks assessed/performed               Past Medical History:  Diagnosis Date   Glaucoma    Hyperlipidemia    Hypertension    Stroke [facility])    Past Surgical History:  Procedure Laterality Date   BREAST BIOPSY Right 09/29/2015   BREAST EXCISIONAL BIOPSY Right 11/04/2015   papilloma   BREAST LUMPECTOMY WITH RADIOACTIVE SEED LOCALIZATION Right 11/04/2015   Procedure: BREAST LUMPECTOMY WITH RADIOACTIVE SEED LOCALIZATION;  Surgeon: Autumn Messing III, MD;  Location: [place];  Service: General;  Laterality: Right;   LIPOMA EXCISION     Patient Active Problem List   Diagnosis Date Noted   Family history of malignant neoplasm of digestive organs 10/28/2021   Cerebrovascular disease 09/13/2021   Acute ischemic stroke ([place]) 09/01/2021   LBBB (left bundle branch block) 09/01/2021   Obesity (BMI 30-39.9) 12/30/2019   Stage 3a chronic kidney disease (CKD) ([place]) -Baseline Scr 1.3-1.6 02/05/2017   Osteopenia 11/02/2016   Vitamin D deficiency 12/10/2014   Hyperlipidemia 06/11/2014   Type 2 diabetes mellitus with diabetic chronic kidney disease ([place])    Glaucoma    Essential hypertension 06/28/2006   Gastroesophageal reflux disease 06/28/2006   ECZEMA, ATOPIC DERMATITIS 06/28/2006    ONSET DATE: 11/07/21  REFERRING DIAG: CVA  THERAPY DIAG:  Visuospatial deficit  Muscle  weakness (generalized)  Attention and concentration deficit  Frontal lobe and executive function deficit  Other lack of coordination  Unsteadiness on feet  Rationale for Evaluation and Treatment Rehabilitation  SUBJECTIVE:   SUBJECTIVE STATEMENT: *** Denies pain  Pt accompanied by: husband  PERTINENT HISTORY: 76 yo female presenting to ED on 7/10 with diplopia. Recently came to ED on 7/8 for diplopia, MRI was ordered but wait was too long so pt left. Current MRI showing acute and subacute infarcts in right periaqueductal gray matter.   PRECAUTIONS: Fall  WEIGHT BEARING RESTRICTIONS No  PAIN:  Are you having pain? No  FALLS: Has patient fallen in last 6 months? No  LIVING ENVIRONMENT: Lives with: lives with their spouse Lives in: House/apartment Stairs: No Has following equipment at home: Single point cane  PLOF: Independent  PATIENT GOALS to get back to myself, to drive  OBJECTIVE:   VISION: Subjective report: Pt reports diplopia with fatigue Baseline vision: Wears glasses all the time Visual history: glaucoma per pt report  VISION ASSESSMENT: Impaired Eye alignment: WFL Tracking/Visual pursuits: Able to track stimulus in all quads without difficulty Visual Fields: no apparent deficits  Patient has difficulty with following activities due to following visual impairments: reading, sometimes too blurry   OBSERVATIONS: Pt requires increased time and v.c for commands   TODAY'S TREATMENT:  *** Simple cooking task to locate items then scramble an egg,  min-mod questioning cues. Pt required min-mod questioning cues to locate items, and for safety. Pt had butter in the pan and it was turned up too high and pt stepped away to get something. Therapist cued pt turned stove down. Pt cooked the egg safely and turned off the stove. Therapist cued  pt.to leave hot pan on the stove to cool.  Graded clothespins for sustained pinch, 1-8# with LUE, min v.c  Grooved pegs  with LUE for increased fine motor coordination, min-mod difficulty/ v.c BP remains elevated, therapist recommends pt calls PCP.  HOME EXERCISE PROGRAM: 12/27/21- vision HEP 01/10/22 Coordination HEP, memory compensations     GOALS:   SHORT TERM GOALS: Target date: 01/05/22   I with HEP for vision Baseline: Goal status: INITIAL  2.  I with HEP for LUE coordination Baseline:  Goal status: INITIAL  3.  Pt will verbalize understanding of compensatory strategies for short term memory/ cognitive deficits. Baseline:  Goal status: INITIAL  4.  Pt/ husband will verbalize understanding of AE recommendations for increased safety with shower transfers/ shower.(Ie: tub transfer bench) Baseline:  Goal status: INITIAL  5  LONG TERM GOALS: Target date: 02/28/22  Pt will perform basic cooking with supervision demonstrating good safety awareness. Baseline:  Goal status: INITIAL  2.  Pt will perform home management activities demonstrating good safety awareness. Baseline:  Goal status: INITIAL  3.  Pt report ability to consistently read newsprint without diplopia Baseline:  Goal status: INITIAL  ASSESSMENT:  CLINICAL IMPRESSION: *** Patient is  progressing towards goals. She will benefit from additional practice of simple cooking tasks at home with assistance/ supervision from her husband for safety.  PERFORMANCE DEFICITS in functional skills including ADLs, IADLs, coordination, dexterity, FMC, mobility, balance, decreased knowledge of precautions, decreased knowledge of use of DME, vision, and UE functional use, cognitive skills including attention, learn, memory, problem solving, safety awareness, sequencing, thought, and understand, and psychosocial skills including coping strategies, environmental adaptation, habits, interpersonal interactions, and routines and behaviors.   IMPAIRMENTS are limiting patient from ADLs, IADLs, rest and sleep, play, leisure, and social participation.    COMORBIDITIES may have co-morbidities  that affects occupational performance. Patient will benefit from skilled OT to address above impairments and improve overall function.  MODIFICATION OR ASSISTANCE TO COMPLETE EVALUATION: Min-Moderate modification of tasks or assist with assess necessary to complete an evaluation.  OT OCCUPATIONAL PROFILE AND HISTORY: Detailed assessment: Review of records and additional review of physical, cognitive, psychosocial history related to current functional performance.  CLINICAL DECISION MAKING: LOW - limited treatment options, no task modification necessary  REHAB POTENTIAL: Good  EVALUATION COMPLEXITY: Low   PLAN: OT FREQUENCY: 1x/week  OT DURATION: 12 weeks (anticipate d/c after 6 weeks)  PLANNED INTERVENTIONS: self care/ADL training, therapeutic exercise, therapeutic activity, neuromuscular re-education, manual therapy, gait training, balance training, functional mobility training, moist heat, cryotherapy, contrast bath, patient/family education, cognitive remediation/compensation, visual/perceptual remediation/compensation, coping strategies training, DME and/or AE instructions, and Re-evaluation  RECOMMENDED OTHER SERVICES: PT  CONSULTED AND AGREED WITH PLAN OF CARE: Patient and family member/caregiver  PLAN FOR NEXT SESSION: monitor BP, coordination, visual perceptual skills   Nivaan Dicenzo, OTR/L  01/24/2022, 10:33 AM

## 2022-01-24 ENCOUNTER — Ambulatory Visit: Payer: Medicare HMO | Admitting: Occupational Therapy

## 2022-01-24 ENCOUNTER — Ambulatory Visit: Payer: Medicare HMO | Admitting: Physical Therapy

## 2022-01-24 ENCOUNTER — Telehealth: Payer: Self-pay | Admitting: Physical Therapy

## 2022-01-24 ENCOUNTER — Encounter: Payer: Self-pay | Admitting: Occupational Therapy

## 2022-01-24 VITALS — BP 184/98

## 2022-01-24 DIAGNOSIS — R2689 Other abnormalities of gait and mobility: Secondary | ICD-10-CM

## 2022-01-24 DIAGNOSIS — M6281 Muscle weakness (generalized): Secondary | ICD-10-CM

## 2022-01-24 DIAGNOSIS — R2681 Unsteadiness on feet: Secondary | ICD-10-CM

## 2022-01-24 DIAGNOSIS — R41842 Visuospatial deficit: Secondary | ICD-10-CM

## 2022-01-24 NOTE — Therapy (Signed)
OUTPATIENT PHYSICAL THERAPY NEURO TREATMENT-ARRIVED NO CHARGE   Patient Name: Heather Gross MRN: [id number] DOB:April 13, 1946, 76 y.o., female 66 Date: 01/24/2022   PCP: [name], NP REFERRING PROVIDER: Flora Lipps, MD    PT End of Session - 01/24/22 1022     Visit Number 4    Number of Visits 7   with eval   Date for PT Re-Evaluation 93/71/69   recert, to allow for scheduling delays   Authorization Type [employer]    PT Start Time 52    PT Stop Time 1040   arrived no charge, unable to participate due to hypertension   PT Time Calculation (min) 20 min    Equipment Utilized During Treatment Gait belt    Activity Tolerance Treatment limited secondary to medical complications (Comment)   HTN   Behavior During Therapy [facility] for tasks assessed/performed                 Past Medical History:  Diagnosis Date   Glaucoma    Hyperlipidemia    Hypertension    Stroke [facility])    Past Surgical History:  Procedure Laterality Date   BREAST BIOPSY Right 09/29/2015   BREAST EXCISIONAL BIOPSY Right 11/04/2015   papilloma   BREAST LUMPECTOMY WITH RADIOACTIVE SEED LOCALIZATION Right 11/04/2015   Procedure: BREAST LUMPECTOMY WITH RADIOACTIVE SEED LOCALIZATION;  Surgeon: Autumn Messing III, MD;  Location: [place];  Service: General;  Laterality: Right;   LIPOMA EXCISION     Patient Active Problem List   Diagnosis Date Noted   Family history of malignant neoplasm of digestive organs 10/28/2021   Cerebrovascular disease 09/13/2021   Acute ischemic stroke ([place]) 09/01/2021   LBBB (left bundle branch block) 09/01/2021   Obesity (BMI 30-39.9) 12/30/2019   Stage 3a chronic kidney disease (CKD) ([place]) -Baseline Scr 1.3-1.6 02/05/2017   Osteopenia 11/02/2016   Vitamin D deficiency 12/10/2014   Hyperlipidemia 06/11/2014   Type 2 diabetes mellitus with diabetic chronic kidney disease ([place])    Glaucoma    Essential hypertension 06/28/2006   Gastroesophageal reflux  disease 06/28/2006   ECZEMA, ATOPIC DERMATITIS 06/28/2006    ONSET DATE: 11/09/2021   REFERRING DIAG: I63.9 (ICD-10-CM) - Acute ischemic stroke (HCC)   THERAPY DIAG:  Muscle weakness (generalized)  Unsteadiness on feet  Other abnormalities of gait and mobility  Rationale for Evaluation and Treatment Rehabilitation  SUBJECTIVE:                                                                                                                                                                                              SUBJECTIVE  STATEMENT: Pt reports she is doing well today, no pain, no falls or near falls. Pt's husband reached out to doctor about BP meds last week after therapy sessions, hasn't heard back yet from the office. Pt's husband reports that her "top # is coming down, bottom # is still high" regarding BP. Pt's husband reports she is not "wall-walking" anymore and pt not using AD in the home. Pt only uses [employer] for outdoors and community mobility.  Pt accompanied by: self and significant other husband [name]  PERTINENT HISTORY: hypertension, hyperlipidemia, CKD 3, LBBB, obesity, and recent stroke.   PAIN:  Are you having pain? No  PRECAUTIONS: Fall   PATIENT GOALS "to get stronger"  OBJECTIVE:   TODAY'S TREATMENT:   THER ACT: BP assessed during session due to history of hypertension: Vitals:   01/24/22 1028  BP: (!) 184/98   Again educated pt on importance of monitoring BP and that she/her husband should reach out to her PCP regarding a trend of elevated BP over the past few weeks and particularly with her BP this date. Provided education about importance of keeping BP under control to decrease stress on blood vessels/arteries and to prevent another stroke. Educated pt and her husband that it is not safe to participate in therapy session this date due to elevated BP. Attempted to send a Telephone Encounter to patient's PCP, [name] NP but she is not in Epic system. Pt  and her husband given instructions to call PCP after leaving clinic due to ongoing elevated BP.   PATIENT EDUCATION: Education details: continue to monitor BP and reach out to PCP about elevated BP Person educated: Patient and Spouse Education method: Explanation Education comprehension: verbalized understanding   HOME EXERCISE PROGRAM: Access Code: [email or web address] URL: [email or web address] Date: 12/27/2021 Prepared by: Excell Seltzer  Exercises - Alternating Step Taps with Counter Support  - 1 x daily - 7 x weekly - 3 sets - 10 reps - Side Stepping with Resistance at Ankles and Counter Support  - 1 x daily - 7 x weekly - 1 sets - 10 reps - Tandem Walking with Counter Support  - 1 x daily - 7 x weekly - 1 sets - 10 reps - Step-Over  - 1 x daily - 7 x weekly - 3 sets - 10 reps - Standing Single Leg Stance with Counter Support  - 1 x daily - 7 x weekly - 1 sets - 5 reps - 30 sec hold   GOALS: Goals reviewed with patient? Yes  SHORT TERM GOALS: Target date: 12/27/2021  Initial HEP to be established Baseline: initiated 8/29 Goal status: MET  2.  Pt will improve Berg score to 42/56 for decreased fall risk Baseline: 38/56 (8/8), 43/56 (8/29) Goal status: MET  3.  Pt will improve gait velocity to at least 2.5 ft/sec for improved gait efficiency and performance at Supervision level  Baseline: 2.26 ft/sec (8/8), 2.15 ft/sec (8/29) Goal status: IN PROGRESS   LONG TERM GOALS: Target date: 01/17/2022  Pt will be independent with final balance HEP for improved strength, balance, transfers and gait. Baseline:  Goal status: IN PROGRESS  2.  Pt will improve Berg score to 46/56 for decreased fall risk Baseline: 38/56 (8/8), 43/56 (8/29), 47/56 (9/19) Goal status: MET  3.  Pt will improve gait velocity to at least 3.0 ft/sec for improved gait efficiency and performance at Supervision level   Baseline: 2.26 ft/sec (8/8), 2.15 ft/sec (8/29), 1.89 ft/sec (9/19) Goal status: NOT  MET  NEW LONG TERM GOALS: Target date: 02/14/2022  Pt will be independent with final balance HEP for improved strength, balance, transfers and gait. Baseline:  Goal status: IN PROGRESS  2.  Pt will improve FGA to 18/30 for decreased fall risk  Baseline: 12/30 (9/19) Goal status: INITIAL  3.  Pt will improve gait velocity to at least 2.5 ft/sec for improved gait efficiency and performance at Supervision level   Baseline: 2.26 ft/sec (8/8), 2.15 ft/sec (8/29), 1.89 ft/sec (9/19) Goal status: REVISED/UPDATED    ASSESSMENT:  CLINICAL IMPRESSION: Arrived no charge due to elevated BP this date, unsafe to participate in therapy.   OBJECTIVE IMPAIRMENTS Abnormal gait, decreased balance, decreased safety awareness, and impaired vision/preception.   ACTIVITY LIMITATIONS carrying, lifting, bending, squatting, stairs, and locomotion level  PARTICIPATION LIMITATIONS: meal prep, cleaning, laundry, interpersonal relationship, driving, shopping, and community activity  PERSONAL FACTORS Age and 1-2 comorbidities:   hypertension, hyperlipidemia, CKD 3, LBBB, obesity, and recent stroke. are also affecting patient's functional outcome.   REHAB POTENTIAL: Good  CLINICAL DECISION MAKING: Stable/uncomplicated  EVALUATION COMPLEXITY: Low  PLAN: PT FREQUENCY: 1x/week  PT DURATION: 6 weeks+ 4 weeks (recert)  PLANNED INTERVENTIONS: Therapeutic exercises, Therapeutic activity, Neuromuscular re-education, Balance training, Gait training, Patient/Family education, Self Care, Joint mobilization, Stair training, DME instructions, Cryotherapy, Moist heat, Manual therapy, and Re-evaluation  PLAN FOR NEXT SESSION: add to HEP, work on L inattention, wants to work towards decreased AD reliance, monster walks, dot/ladder drills, mini-squats, gait on compliant surfaces, obstacle course, monitor BP   Excell Seltzer, PT, DPT, CSRS 01/24/2022, 3:36 PM

## 2022-01-25 ENCOUNTER — Other Ambulatory Visit: Payer: Self-pay | Admitting: Family

## 2022-01-25 DIAGNOSIS — I129 Hypertensive chronic kidney disease with stage 1 through stage 4 chronic kidney disease, or unspecified chronic kidney disease: Secondary | ICD-10-CM

## 2022-01-25 DIAGNOSIS — N1831 Chronic kidney disease, stage 3a: Secondary | ICD-10-CM

## 2022-01-26 NOTE — Therapy (Signed)
[facility] [address] [place], [place], [address] Phone: [phone]   Fax:  [phone]  Patient Details  Name: Heather Gross MRN: [id number] Date of Birth: [birth year] Referring Provider:  [name], NP  Encounter Date: 01/24/2022   Pt arrived for PT appt prior to OT and left without being seen for OT due to high BP--see PT note for details.  [facility], OTR/L 01/26/2022, 2:32 PM  [facility] [address] [place], [place], [address] Phone: [phone]   Fax:  [phone]

## 2022-01-27 ENCOUNTER — Telehealth: Payer: Self-pay

## 2022-01-27 NOTE — Telephone Encounter (Signed)
VMT pt requesting call back to discuss next PREP class at [facility].

## 2022-01-30 ENCOUNTER — Other Ambulatory Visit: Payer: Medicare HMO

## 2022-01-31 ENCOUNTER — Ambulatory Visit: Payer: Medicare HMO | Admitting: Occupational Therapy

## 2022-01-31 ENCOUNTER — Ambulatory Visit: Payer: Medicare HMO | Attending: Family Medicine | Admitting: Physical Therapy

## 2022-01-31 ENCOUNTER — Encounter: Payer: Self-pay | Admitting: Occupational Therapy

## 2022-01-31 VITALS — BP 128/79 | HR 106

## 2022-01-31 DIAGNOSIS — R41842 Visuospatial deficit: Secondary | ICD-10-CM | POA: Diagnosis present

## 2022-01-31 DIAGNOSIS — M6281 Muscle weakness (generalized): Secondary | ICD-10-CM | POA: Insufficient documentation

## 2022-01-31 DIAGNOSIS — R41840 Attention and concentration deficit: Secondary | ICD-10-CM | POA: Insufficient documentation

## 2022-01-31 DIAGNOSIS — R41844 Frontal lobe and executive function deficit: Secondary | ICD-10-CM

## 2022-01-31 DIAGNOSIS — R278 Other lack of coordination: Secondary | ICD-10-CM | POA: Insufficient documentation

## 2022-01-31 DIAGNOSIS — R2681 Unsteadiness on feet: Secondary | ICD-10-CM | POA: Diagnosis present

## 2022-01-31 DIAGNOSIS — R2689 Other abnormalities of gait and mobility: Secondary | ICD-10-CM | POA: Insufficient documentation

## 2022-01-31 NOTE — Therapy (Signed)
OUTPATIENT OCCUPATIONAL THERAPY NEURO Treatment  Patient Name: Heather Gross MRN: [id number] DOB:04-03-1946, 76 y.o., female Today's Date: 01/31/2022  PCP:  [name] NP REFERRING PROVIDER: Dr. Louanne Belton   OT End of Session - 01/31/22 1107     Visit Number 5    Number of Visits 12    Date for OT Re-Evaluation 02/28/22    Authorization Type [employer]    Authorization - Visit Number 5    Progress Note Due on Visit 10    OT Start Time 1106    OT Stop Time 1145    OT Time Calculation (min) 39 min    Behavior During Therapy [facility] for tasks assessed/performed               Past Medical History:  Diagnosis Date   Glaucoma    Hyperlipidemia    Hypertension    Stroke [facility])    Past Surgical History:  Procedure Laterality Date   BREAST BIOPSY Right 09/29/2015   BREAST EXCISIONAL BIOPSY Right 11/04/2015   papilloma   BREAST LUMPECTOMY WITH RADIOACTIVE SEED LOCALIZATION Right 11/04/2015   Procedure: BREAST LUMPECTOMY WITH RADIOACTIVE SEED LOCALIZATION;  Surgeon: Autumn Messing III, MD;  Location: [place];  Service: General;  Laterality: Right;   LIPOMA EXCISION     Patient Active Problem List   Diagnosis Date Noted   Family history of malignant neoplasm of digestive organs 10/28/2021   Cerebrovascular disease 09/13/2021   Acute ischemic stroke ([place]) 09/01/2021   LBBB (left bundle branch block) 09/01/2021   Obesity (BMI 30-39.9) 12/30/2019   Stage 3a chronic kidney disease (CKD) ([place]) -Baseline Scr 1.3-1.6 02/05/2017   Osteopenia 11/02/2016   Vitamin D deficiency 12/10/2014   Hyperlipidemia 06/11/2014   Type 2 diabetes mellitus with diabetic chronic kidney disease ([place])    Glaucoma    Essential hypertension 06/28/2006   Gastroesophageal reflux disease 06/28/2006   ECZEMA, ATOPIC DERMATITIS 06/28/2006    ONSET DATE: 11/07/21  REFERRING DIAG: CVA  THERAPY DIAG:  Frontal lobe and executive function deficit  Attention and concentration  deficit  Visuospatial deficit  Unsteadiness on feet  Muscle weakness (generalized)  Other lack of coordination  Rationale for Evaluation and Treatment Rehabilitation  SUBJECTIVE:   SUBJECTIVE STATEMENT:  Pt reports that son and husband does most of the cooking, sometimes she helps with supervision.  Pt accompanied by: husband  PERTINENT HISTORY: 76 yo female presenting to ED on 7/10 with diplopia. Recently came to ED on 7/8 for diplopia, MRI was ordered but wait was too long so pt left. 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   PRECAUTIONS: Fall  WEIGHT BEARING RESTRICTIONS No  PAIN:  Are you having pain? No  FALLS: Has patient fallen in last 6 months? No  LIVING ENVIRONMENT: Lives with: lives with their spouse Lives in: House/apartment Stairs: No Has following equipment at home: Single point cane  PLOF: Independent  PATIENT GOALS to get back to myself, to drive  OBJECTIVE:   VISION (from eval): Subjective report: Pt reports diplopia with fatigue Baseline vision: Wears glasses all the time Visual history: glaucoma per pt report  VISION ASSESSMENT: Impaired Eye alignment: WFL Tracking/Visual pursuits: Able to track stimulus in all quads without difficulty Visual Fields: no apparent deficits  Patient has difficulty with following activities due to following visual impairments: reading, sometimes too blurry   OBSERVATIONS (from eval): Pt requires increased time and v.c for commands   TODAY'S TREATMENT:  Per PT, BP WNL today.  Pt reports that MD incr medication from 1 pill to 2.    4M horizontal word search with mod difficulty/incr time.  Cued pt to use line guide.  Completed approx half, pt instructed to complete remaining for homework.  Placing small pegs in pegboard with L hand for incr coordination and visual scanning with good accuracy with visual perceptual skills and only min cueing initially to avoid  assisting with R hand for L hand in-hand manipulation.    Environmental Scanning/navigation (ambulating) in min distracting environment with 12/16 accuracy = 75% on first pass. Pt able to locate remaining items on second pass.  Discussed errors and need to perform head turns to compensate for visual deficits.  Pt with no LOB or reports of dizziness, but min cueing provided to place cane on ground vs carrying it for incr safety.  Also lowered pt's cane height for improved comfort and stability.      HOME EXERCISE PROGRAM: 12/27/21- vision HEP 01/10/22 Coordination HEP, memory compensations     GOALS:   SHORT TERM GOALS: Target date: 01/05/22   I with HEP for vision Baseline: Goal status: IN PROGRESS  Pt reports performing daily  2.  I with HEP for LUE coordination Baseline:  Goal status: IN PROGRESS  Pt reports performing daily.  3.  Pt will verbalize understanding of compensatory strategies for short term memory/ cognitive deficits. Baseline:  Goal status: INITIAL  4.  Pt/ husband will verbalize understanding of AE recommendations for increased safety with shower transfers/ shower.(Ie: tub transfer bench) Baseline:  Goal status: IN PROGRESS  5  LONG TERM GOALS: Target date: 02/28/22  Pt will perform basic cooking with supervision demonstrating good safety awareness. Goal status: INITIAL  2.  Pt will perform home management activities demonstrating good safety awareness. Goal status: IN PROGRESS  01/31/22:  making beds, doing laundry  3.  Pt report ability to consistently read newsprint without diplopia Goal status: INITIAL  ASSESSMENT:  CLINICAL IMPRESSION: Pt is progressing towards goals.  Pt demo improved visual scanning and coordination.    PERFORMANCE DEFICITS in functional skills including ADLs, IADLs, coordination, dexterity, FMC, mobility, balance, decreased knowledge of precautions, decreased knowledge of use of DME, vision, and UE functional use, cognitive  skills including attention, learn, memory, problem solving, safety awareness, sequencing, thought, and understand, and psychosocial skills including coping strategies, environmental adaptation, habits, interpersonal interactions, and routines and behaviors.   IMPAIRMENTS are limiting patient from ADLs, IADLs, rest and sleep, play, leisure, and social participation.   COMORBIDITIES may have co-morbidities  that affects occupational performance. Patient will benefit from skilled OT to address above impairments and improve overall function.  MODIFICATION OR ASSISTANCE TO COMPLETE EVALUATION: Min-Moderate modification of tasks or assist with assess necessary to complete an evaluation.  OT OCCUPATIONAL PROFILE AND HISTORY: Detailed assessment: Review of records and additional review of physical, cognitive, psychosocial history related to current functional performance.  CLINICAL DECISION MAKING: LOW - limited treatment options, no task modification necessary  REHAB POTENTIAL: Good  EVALUATION COMPLEXITY: Low  PLAN: OT FREQUENCY: 1x/week  OT DURATION: 12 weeks (anticipate d/c after 6 weeks)  PLANNED INTERVENTIONS: self care/ADL training, therapeutic exercise, therapeutic activity, neuromuscular re-education, manual therapy, gait training, balance training, functional mobility training, moist heat, cryotherapy, contrast bath, patient/family education, cognitive remediation/compensation, visual/perceptual remediation/compensation, coping strategies training, DME and/or AE instructions, and Re-evaluation  RECOMMENDED OTHER SERVICES: PT  CONSULTED AND AGREED WITH PLAN OF CARE: Patient and family Midwife  PLAN FOR  NEXT SESSION:  update/check progress towards goals, continue to monitor BP, coordination, visual perceptual skills (environmental scanning, functional scanning), simple home maintenance task   [facility], OTR/L 01/31/2022, 11:08 AM

## 2022-01-31 NOTE — Therapy (Signed)
OUTPATIENT PHYSICAL THERAPY NEURO TREATMENT   Patient Name: Heather Gross MRN: [id number] DOB:06-Sep-1945, 76 y.o., female Today's Date: 01/31/2022   PCP: [name], NP REFERRING PROVIDER: Flora Lipps, MD    PT End of Session - 01/31/22 1024     Visit Number 5    Number of Visits 7   with eval   Date for PT Re-Evaluation 48/25/00   recert, to allow for scheduling delays   Authorization Type [employer]    PT Start Time 1022   pt late   PT Stop Time 1100    PT Time Calculation (min) 38 min    Equipment Utilized During Treatment Gait belt    Activity Tolerance Treatment limited secondary to medical complications (Comment)   HTN   Behavior During Therapy [facility] for tasks assessed/performed                 Past Medical History:  Diagnosis Date   Glaucoma    Hyperlipidemia    Hypertension    Stroke [facility])    Past Surgical History:  Procedure Laterality Date   BREAST BIOPSY Right 09/29/2015   BREAST EXCISIONAL BIOPSY Right 11/04/2015   papilloma   BREAST LUMPECTOMY WITH RADIOACTIVE SEED LOCALIZATION Right 11/04/2015   Procedure: BREAST LUMPECTOMY WITH RADIOACTIVE SEED LOCALIZATION;  Surgeon: Heather Messing III, MD;  Location: [place];  Service: General;  Laterality: Right;   LIPOMA EXCISION     Patient Active Problem List   Diagnosis Date Noted   Family history of malignant neoplasm of digestive organs 10/28/2021   Cerebrovascular disease 09/13/2021   Acute ischemic stroke (Heather Gross) 09/01/2021   LBBB (left bundle branch block) 09/01/2021   Obesity (BMI 30-39.9) 12/30/2019   Stage 3a chronic kidney disease (CKD) (Heather Gross) -Baseline Scr 1.3-1.6 02/05/2017   Osteopenia 11/02/2016   Vitamin D deficiency 12/10/2014   Hyperlipidemia 06/11/2014   Type 2 diabetes mellitus with diabetic chronic kidney disease (Heather Gross)    Glaucoma    Essential hypertension 06/28/2006   Gastroesophageal reflux disease 06/28/2006   ECZEMA, ATOPIC DERMATITIS 06/28/2006    ONSET  DATE: 11/09/2021   REFERRING DIAG: I63.9 (ICD-10-CM) - Acute ischemic stroke (HCC)   THERAPY DIAG:  Muscle weakness (generalized)  Unsteadiness on feet  Other abnormalities of gait and mobility  Rationale for Evaluation and Treatment Rehabilitation  SUBJECTIVE:                                                                                                                                                                                              SUBJECTIVE STATEMENT: Pt's husband reports they got a hold of  her PCP after last visit and were able to have her BP meds changed, she is on a new medication to manage BP. Yesterday evening her reading was 121/62, this morning 143/76.  Pt accompanied by: self and significant other husband Heather Gross  PERTINENT HISTORY: hypertension, hyperlipidemia, CKD 3, LBBB, obesity, and recent stroke.   PAIN:  Are you having pain? No  PRECAUTIONS: Fall   PATIENT GOALS "to get stronger"  OBJECTIVE:   TODAY'S TREATMENT:   THER ACT: BP assessed during session due to history of hypertension, much improved this date from previous sessions: Vitals:   01/31/22 1028  BP: 128/79  Pulse: (!) 106   Ambulation over 4" hurdles with hurricane and CGA for balance, occasional circumduction of RLE with cues needed to prevent hip compensations.  Standing 2" beam forward/backward step-overs with hurricane and min A for balance, 2 x 10 reps each side. Pt exhibits most difficulty stepping with RLE with decreased trunk rotation noted.  Static stance on airex initially with one UE support on back of chair with progression to no UE support, 3 x 30 sec each with multidirectional sway noted but no LOB and good use of ankle strategy. Added in horizontal head turns with no LOB, vertical head turns with LOB anteriorly when looking up.  GAIT: Gait pattern: decreased step length- Right, decreased step length- Left, shuffling, poor foot clearance- Right, and poor foot  clearance- Left Distance walked: 115 ft Assistive device utilized: Quad cane small base "hurricane" Level of assistance: Modified independence Comments: decreased B step length and foot clearance, cues to increase heel strike with gait  Gait around therapy gym with focus on horizontal and vertical head turns. With addition of turns pt has decrease in gait speed and step length. Following gait with head turns pt exhibits decreased overall speed and needs cues to increase speed.  PATIENT EDUCATION: Education details: continue HEP, continue to monitor and log BP Person educated: Patient and Spouse Education method: Explanation Education comprehension: verbalized understanding   HOME EXERCISE PROGRAM: Access Code: [email or web address] URL: [email or web address] Date: 12/27/2021 Prepared by: Taylor Turkalo  Exercises - Alternating Step Taps with Counter Support  - 1 x daily - 7 x weekly - 3 sets - 10 reps - Side Stepping with Resistance at Ankles and Counter Support  - 1 x daily - 7 x weekly - 1 sets - 10 reps - Tandem Walking with Counter Support  - 1 x daily - 7 x weekly - 1 sets - 10 reps - Step-Over  - 1 x daily - 7 x weekly - 3 sets - 10 reps - Standing Single Leg Stance with Counter Support  - 1 x daily - 7 x weekly - 1 sets - 5 reps - 30 sec hold   GOALS: Goals reviewed with patient? Yes  SHORT TERM GOALS: Target date: 12/27/2021  Initial HEP to be established Baseline: initiated 8/29 Goal status: MET  2.  Pt will improve Berg score to 42/56 for decreased fall risk Baseline: 38/56 (8/8), 43/56 (8/29) Goal status: MET  3.  Pt will improve gait velocity to at least 2.5 ft/sec for improved gait efficiency and performance at Supervision level  Baseline: 2.26 ft/sec (8/8), 2.15 ft/sec (8/29) Goal status: IN PROGRESS   LONG TERM GOALS: Target date: 01/17/2022  Pt will be independent with final balance HEP for improved strength, balance, transfers and gait. Baseline:   Goal status: IN PROGRESS  2.  Pt will improve Berg score to 46/56 for decreased fall risk   Baseline: 38/56 (8/8), 43/56 (8/29), 47/56 (9/19) Goal status: MET  3.  Pt will improve gait velocity to at least 3.0 ft/sec for improved gait efficiency and performance at Supervision level   Baseline: 2.26 ft/sec (8/8), 2.15 ft/sec (8/29), 1.89 ft/sec (9/19) Goal status: NOT MET  NEW LONG TERM GOALS: Target date: 02/14/2022  Pt will be independent with final balance HEP for improved strength, balance, transfers and gait. Baseline:  Goal status: IN PROGRESS  2.  Pt will improve FGA to 18/30 for decreased fall risk  Baseline: 12/30 (9/19) Goal status: INITIAL  3.  Pt will improve gait velocity to at least 2.5 ft/sec for improved gait efficiency and performance at Supervision level   Baseline: 2.26 ft/sec (8/8), 2.15 ft/sec (8/29), 1.89 ft/sec (9/19) Goal status: REVISED/UPDATED    ASSESSMENT:  CLINICAL IMPRESSION: Emphasis of skilled PT session on working on improving gait mechanics including increased step length and clearance B (R>L) and use of ankle strategy for static standing balance. Pt continues to exhibit shuffling gait pattern with decreased LE clearance during gait, slight improvement noted following practice during therapy session. As session progresses pt also exhibits increased ability to utilize ankle strategy to maintain static standing balance on compliant surface. Pt exhibits significantly improved BP reading this session due to change in BP medication. Continue POC.   OBJECTIVE IMPAIRMENTS Abnormal gait, decreased balance, decreased safety awareness, and impaired vision/preception.   ACTIVITY LIMITATIONS carrying, lifting, bending, squatting, stairs, and locomotion level  PARTICIPATION LIMITATIONS: meal prep, cleaning, laundry, interpersonal relationship, driving, shopping, and community activity  PERSONAL FACTORS Age and 1-2 comorbidities:   hypertension,  hyperlipidemia, CKD 3, LBBB, obesity, and recent stroke. are also affecting patient's functional outcome.   REHAB POTENTIAL: Good  CLINICAL DECISION MAKING: Stable/uncomplicated  EVALUATION COMPLEXITY: Low  PLAN: PT FREQUENCY: 1x/week  PT DURATION: 6 weeks+ 4 weeks (recert)  PLANNED INTERVENTIONS: Therapeutic exercises, Therapeutic activity, Neuromuscular re-education, Balance training, Gait training, Patient/Family education, Self Care, Joint mobilization, Stair training, DME instructions, Cryotherapy, Moist heat, Manual therapy, and Re-evaluation  PLAN FOR NEXT SESSION: add to HEP (resisted step-taps, pillow balance), work on L inattention, wants to work towards decreased AD reliance, monster walks, dot/ladder drills, mini-squats, gait on compliant surfaces, obstacle course, monitor BP   Taylor  Turkalo, PT, DPT, CSRS 01/31/2022, 11:02 AM    

## 2022-02-03 ENCOUNTER — Telehealth: Payer: Self-pay

## 2022-02-03 NOTE — Telephone Encounter (Signed)
Retrying pt for PREP starting on Oct 17th VMT pt requesting call back to discuss

## 2022-02-06 ENCOUNTER — Encounter: Payer: Self-pay | Admitting: Occupational Therapy

## 2022-02-06 ENCOUNTER — Ambulatory Visit: Payer: Medicare HMO | Admitting: Occupational Therapy

## 2022-02-06 ENCOUNTER — Ambulatory Visit: Payer: Medicare HMO | Admitting: Physical Therapy

## 2022-02-06 VITALS — BP 140/79 | HR 105

## 2022-02-06 VITALS — BP 129/82 | HR 93

## 2022-02-06 DIAGNOSIS — R278 Other lack of coordination: Secondary | ICD-10-CM

## 2022-02-06 DIAGNOSIS — R41844 Frontal lobe and executive function deficit: Secondary | ICD-10-CM

## 2022-02-06 DIAGNOSIS — M6281 Muscle weakness (generalized): Secondary | ICD-10-CM | POA: Diagnosis not present

## 2022-02-06 DIAGNOSIS — R41842 Visuospatial deficit: Secondary | ICD-10-CM

## 2022-02-06 DIAGNOSIS — R2681 Unsteadiness on feet: Secondary | ICD-10-CM

## 2022-02-06 DIAGNOSIS — R41840 Attention and concentration deficit: Secondary | ICD-10-CM

## 2022-02-06 DIAGNOSIS — R2689 Other abnormalities of gait and mobility: Secondary | ICD-10-CM

## 2022-02-06 NOTE — Therapy (Signed)
OUTPATIENT PHYSICAL THERAPY NEURO TREATMENT-DISCHARGE   Patient Name: Heather Gross MRN: [id number] DOB:[birth year], 76 y.o., female Today's Date: 02/06/2022   PHYSICAL THERAPY DISCHARGE SUMMARY  Visits from Start of Care: 6  Current functional level related to goals / functional outcomes: Mod I with occasional use of SBQC, occasional use of no AD   Remaining deficits: Ongoing decreased balance and increased fall risk as evidenced by score of 21/30 on FGA and gait speed of 2.34 ft/sec, improved from initial eval   Education / Equipment: Provided handout for HEP   Patient agrees to discharge. Patient goals were partially met. Patient is being discharged due to being pleased with the current functional level.   PCP: [name], NP REFERRING PROVIDER: Flora Lipps, MD    PT End of Session - 02/06/22 1107     Visit Number 6    Number of Visits 7   with eval   Date for PT Re-Evaluation 95/63/87   recert, to allow for scheduling delays   Authorization Type [employer]    PT Start Time 1105   from OT session   PT Stop Time 1135   d/c   PT Time Calculation (min) 30 min    Equipment Utilized During Treatment Gait belt    Activity Tolerance Patient tolerated treatment well    Behavior During Therapy [facility] for tasks assessed/performed                  Past Medical History:  Diagnosis Date   Glaucoma    Hyperlipidemia    Hypertension    Stroke [facility])    Past Surgical History:  Procedure Laterality Date   BREAST BIOPSY Right 09/29/2015   BREAST EXCISIONAL BIOPSY Right 11/04/2015   papilloma   BREAST LUMPECTOMY WITH RADIOACTIVE SEED LOCALIZATION Right 11/04/2015   Procedure: BREAST LUMPECTOMY WITH RADIOACTIVE SEED LOCALIZATION;  Surgeon: Autumn Messing III, MD;  Location: [place];  Service: General;  Laterality: Right;   LIPOMA EXCISION     Patient Active Problem List   Diagnosis Date Noted   Family history of malignant neoplasm of digestive organs  10/28/2021   Cerebrovascular disease 09/13/2021   Acute ischemic stroke ([place]) 09/01/2021   LBBB (left bundle branch block) 09/01/2021   Obesity (BMI 30-39.9) 12/30/2019   Stage 3a chronic kidney disease (CKD) ([place]) -Baseline Scr 1.3-1.6 02/05/2017   Osteopenia 11/02/2016   Vitamin D deficiency 12/10/2014   Hyperlipidemia 06/11/2014   Type 2 diabetes mellitus with diabetic chronic kidney disease ([place])    Glaucoma    Essential hypertension 06/28/2006   Gastroesophageal reflux disease 06/28/2006   ECZEMA, ATOPIC DERMATITIS 06/28/2006    ONSET DATE: 11/09/2021   REFERRING DIAG: I63.9 (ICD-10-CM) - Acute ischemic stroke (HCC)   THERAPY DIAG:  Unsteadiness on feet  Muscle weakness (generalized)  Other abnormalities of gait and mobility  Rationale for Evaluation and Treatment Rehabilitation  SUBJECTIVE:  SUBJECTIVE STATEMENT: Pt reports she is doing well today, no big changes since last session. Pt's husband reports she is doing much better with her mobility, is walking around the house without her cane and is no longer "furniture-walking" or holding onto the wall with balance. Pt and her husband agreeable to d/c from PT this session.  Pt accompanied by: self and significant other husband [name]  PERTINENT HISTORY: hypertension, hyperlipidemia, CKD 3, LBBB, obesity, and recent stroke.   PAIN:  Are you having pain? No  PRECAUTIONS: Fall   PATIENT GOALS "to get stronger"  OBJECTIVE:   TODAY'S TREATMENT:   THER ACT: BP assessed during session due to history of hypertension, much improved this date from previous sessions: Vitals:   02/06/22 1108  BP: (!) 140/79  Pulse: (!) 105  Just got done ambulating with OT prior to BP reading this session.   THER ACT:  [facility] PT Assessment - 02/06/22  1115       Ambulation/Gait   Gait velocity 32.8 ft over 14 sec = 2.34 ft/sec      Functional Gait  Assessment   Gait assessed  Yes    Gait Level Surface Walks 20 ft in less than 5.5 sec, no assistive devices, good speed, no evidence for imbalance, normal gait pattern, deviates no more than 6 in outside of the 12 in walkway width.    Change in Gait Speed Able to change speed, demonstrates mild gait deviations, deviates 6-10 in outside of the 12 in walkway width, or no gait deviations, unable to achieve a major change in velocity, or uses a change in velocity, or uses an assistive device.    Gait with Horizontal Head Turns Performs head turns smoothly with slight change in gait velocity (eg, minor disruption to smooth gait path), deviates 6-10 in outside 12 in walkway width, or uses an assistive device.    Gait with Vertical Head Turns Performs task with slight change in gait velocity (eg, minor disruption to smooth gait path), deviates 6 - 10 in outside 12 in walkway width or uses assistive device    Gait and Pivot Turn Pivot turns safely within 3 sec and stops quickly with no loss of balance.    Step Over Obstacle Is able to step over one shoe box (4.5 in total height) without changing gait speed. No evidence of imbalance.    Gait with Narrow Base of Support Ambulates 4-7 steps.    Gait with Eyes Closed Walks 20 ft, slow speed, abnormal gait pattern, evidence for imbalance, deviates 10-15 in outside 12 in walkway width. Requires more than 9 sec to ambulate 20 ft.    Ambulating Backwards Walks 20 ft, no assistive devices, good speed, no evidence for imbalance, normal gait    Steps Alternating feet, must use rail.    Total Score 21    FGA comment: 21/30, medium fall risk            Reviewed HEP and added resisted monster walks with GTB to HEP, see bolded below.   PATIENT EDUCATION: Education details: continue HEP and added to HEP, continue to monitor and log BP, OM results and functional  implications, d/c from PT Person educated: Patient and Spouse Education method: Explanation and Handouts Education comprehension: verbalized understanding   HOME EXERCISE PROGRAM: Access Code: [email or web address] URL: [email or web address] Date: 12/27/2021 Prepared by: Excell Seltzer  Exercises - Alternating Step Taps with Counter Support  - 1 x daily - 7 x weekly - 3 sets - 10  reps - Side Stepping with Resistance at Ankles and Counter Support  - 1 x daily - 7 x weekly - 1 sets - 10 reps - Tandem Walking with Counter Support  - 1 x daily - 7 x weekly - 1 sets - 10 reps - Step-Over  - 1 x daily - 7 x weekly - 3 sets - 10 reps - Standing Single Leg Stance with Counter Support  - 1 x daily - 7 x weekly - 1 sets - 5 reps - 30 sec hold - Forward Monster Walk with Resistance at Ankles and Counter Support  - 1 x daily - 7 x weekly - 3 sets - 10 reps   GOALS: Goals reviewed with patient? Yes  NEW LONG TERM GOALS: Target date: 02/14/2022  Pt will be independent with final balance HEP for improved strength, balance, transfers and gait. Baseline:  Goal status: MET  2.  Pt will improve FGA to 18/30 for decreased fall risk  Baseline: 12/30 (9/19), 21/30 (10/9) Goal status: MET  3.  Pt will improve gait velocity to at least 2.5 ft/sec for improved gait efficiency and performance at Supervision level   Baseline: 2.26 ft/sec (8/8), 2.15 ft/sec (8/29), 1.89 ft/sec (9/19), 2.34 ft/sec (10/9) Goal status: NOT MET    ASSESSMENT:  CLINICAL IMPRESSION: Emphasis of skilled PT session on reassessing LTG and reviewing final HEP in preparation for d/c from PT services this date. Pt has met 2/3 LTG due to being independent with her HEP and improving her FGA score from 12/30 initially to 21/30 this date. Pt did not meet gait speed goal of 2.5 ft/sec but did show improvement in her speed from 2.26 ft/sec initially to 2.34 ft/sec this date. Pt also exhibits decreased AD reliance with her gait and  mobility. Pt has met goals adequately for a safe d/c from OPPT services at this time, pt and her husband in agreement with d/c plan.   OBJECTIVE IMPAIRMENTS Abnormal gait, decreased balance, decreased safety awareness, and impaired vision/preception.   ACTIVITY LIMITATIONS carrying, lifting, bending, squatting, stairs, and locomotion level  PARTICIPATION LIMITATIONS: meal prep, cleaning, laundry, interpersonal relationship, driving, shopping, and community activity  PERSONAL FACTORS Age and 1-2 comorbidities:   hypertension, hyperlipidemia, CKD 3, LBBB, obesity, and recent stroke. are also affecting patient's functional outcome.   REHAB POTENTIAL: Good  CLINICAL DECISION MAKING: Stable/uncomplicated  EVALUATION COMPLEXITY: Low    Excell Seltzer, PT, DPT, CSRS 02/06/2022, 11:36 AM

## 2022-02-06 NOTE — Therapy (Signed)
OUTPATIENT OCCUPATIONAL THERAPY NEURO Treatment  Patient Name: Heather Gross MRN: [id number] DOB:[birth year], 76 y.o., female Today's Date: 02/06/2022  PCP:  [name] NP REFERRING PROVIDER: Dr. Louanne Belton   OT End of Session - 02/06/22 1026     Visit Number 6    Number of Visits 12    Date for OT Re-Evaluation 02/28/22    Authorization Type [employer]    Authorization - Visit Number 6    Progress Note Due on Visit 10    OT Start Time 1022    OT Stop Time 1100    OT Time Calculation (min) 38 min    Activity Tolerance Patient tolerated treatment well    Behavior During Therapy WFL for tasks assessed/performed                Past Medical History:  Diagnosis Date   Glaucoma    Hyperlipidemia    Hypertension    Stroke [facility])    Past Surgical History:  Procedure Laterality Date   BREAST BIOPSY Right 09/29/2015   BREAST EXCISIONAL BIOPSY Right 11/04/2015   papilloma   BREAST LUMPECTOMY WITH RADIOACTIVE SEED LOCALIZATION Right 11/04/2015   Procedure: BREAST LUMPECTOMY WITH RADIOACTIVE SEED LOCALIZATION;  Surgeon: Autumn Messing III, MD;  Location: [place];  Service: General;  Laterality: Right;   LIPOMA EXCISION     Patient Active Problem List   Diagnosis Date Noted   Family history of malignant neoplasm of digestive organs 10/28/2021   Cerebrovascular disease 09/13/2021   Acute ischemic stroke ([place]) 09/01/2021   LBBB (left bundle branch block) 09/01/2021   Obesity (BMI 30-39.9) 12/30/2019   Stage 3a chronic kidney disease (CKD) ([place]) -Baseline Scr 1.3-1.6 02/05/2017   Osteopenia 11/02/2016   Vitamin D deficiency 12/10/2014   Hyperlipidemia 06/11/2014   Type 2 diabetes mellitus with diabetic chronic kidney disease ([place])    Glaucoma    Essential hypertension 06/28/2006   Gastroesophageal reflux disease 06/28/2006   ECZEMA, ATOPIC DERMATITIS 06/28/2006    ONSET DATE: 11/07/21  REFERRING DIAG: CVA  THERAPY DIAG:  Frontal lobe and executive  function deficit  Attention and concentration deficit  Visuospatial deficit  Unsteadiness on feet  Muscle weakness (generalized)  Other lack of coordination  Rationale for Evaluation and Treatment Rehabilitation  SUBJECTIVE:   SUBJECTIVE STATEMENT:  No reports of diplopia today.  Pt reports that she goes to the eye doctor next Friday.  Husband reports that he cooked 1 time and did well, but that he thinks that pt is "enjoying not cooking."  Pt accompanied by: husband  PERTINENT HISTORY: 76 yo female presenting to ED on 7/10 with diplopia. Recently came to ED on 7/8 for diplopia, MRI was ordered but wait was too long so pt left. 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   PRECAUTIONS: Fall  WEIGHT BEARING RESTRICTIONS No  PAIN:  Are you having pain? No  FALLS: Has patient fallen in last 6 months? No  LIVING ENVIRONMENT: Lives with: lives with their spouse Lives in: House/apartment Stairs: No Has following equipment at home: Single point cane  PLOF: Independent  PATIENT GOALS to get back to myself, to drive  OBJECTIVE:   VISION (from eval): Subjective report: Pt reports diplopia with fatigue Baseline vision: Wears glasses all the time Visual history: glaucoma per pt report  VISION ASSESSMENT: Impaired Eye alignment: WFL Tracking/Visual pursuits: Able to track stimulus in all quads without difficulty Visual Fields: no apparent deficits  Patient has  difficulty with following activities due to following visual impairments: reading, sometimes too blurry   OBSERVATIONS (from eval): Pt requires increased time and v.c for commands   TODAY'S TREATMENT:   Vitals:   02/06/22 1025  BP: 129/82  Pulse: 93     Pt able to read 1/2 page newsprint with good accuracy and no reports of diplopia.    Began checking goals and discussing progress.--see goals section below.  Constant therapy: --Symbol Match:  Level 6, 73%  accuracy, with 54.06sec average response/completion time. --Alternating Symbol Match (for alternating attention and visual scanning):  Level 3,  Pt with max cues/difficulty remembering which symbol that she was on to alternate.  Discontinued due to difficulty/max cueing.    Environmental Scanning/navigation (ambulating) in min distracting environment with 11/15 accuracy = 73% on first pass. Pt able to locate remaining items on second pass with min cueing.  Discussed errors and need to perform head turns to compensate for visual deficits.  Pt with no LOB.    Completing [place] Pegboard with L hand with min difficulty for incr coordination.    HOME EXERCISE PROGRAM: 12/27/21- vision HEP 01/10/22 Coordination HEP, memory compensations   GOALS:   SHORT TERM GOALS: Target date: 01/05/22   I with HEP for vision Baseline: Goal status: IN PROGRESS  Pt reports performing daily  2.  I with HEP for LUE coordination Baseline:  Goal status: IN PROGRESS  Pt reports performing daily.  3.  Pt will verbalize understanding of compensatory strategies for short term memory/ cognitive deficits. Baseline:  Goal status: INITIAL  4.  Pt/ husband will verbalize understanding of AE recommendations for increased safety with shower transfers/ shower.(Ie: tub transfer bench) Baseline:  Goal status: IN PROGRESS   LONG TERM GOALS: Target date: 02/28/22  Pt will perform basic cooking with supervision demonstrating good safety awareness. Goal status: IN PROGRESS  2.  Pt will perform home management activities demonstrating good safety awareness. Goal status: MET  01/31/22:  making beds, doing laundry.  02/06/22:  met per pt/husband report  3.  Pt report ability to consistently read newsprint without diplopia Goal status: MET  ASSESSMENT:  CLINICAL IMPRESSION:  Pt is progressing towards goals.  Pt demo improved visual scanning and coordination, but cognitive deficits remain a barrier.    PERFORMANCE  DEFICITS in functional skills including ADLs, IADLs, coordination, dexterity, FMC, mobility, balance, decreased knowledge of precautions, decreased knowledge of use of DME, vision, and UE functional use, cognitive skills including attention, learn, memory, problem solving, safety awareness, sequencing, thought, and understand, and psychosocial skills including coping strategies, environmental adaptation, habits, interpersonal interactions, and routines and behaviors.   IMPAIRMENTS are limiting patient from ADLs, IADLs, rest and sleep, play, leisure, and social participation.   COMORBIDITIES may have co-morbidities  that affects occupational performance. Patient will benefit from skilled OT to address above impairments and improve overall function.  MODIFICATION OR ASSISTANCE TO COMPLETE EVALUATION: Min-Moderate modification of tasks or assist with assess necessary to complete an evaluation.  OT OCCUPATIONAL PROFILE AND HISTORY: Detailed assessment: Review of records and additional review of physical, cognitive, psychosocial history related to current functional performance.  CLINICAL DECISION MAKING: LOW - limited treatment options, no task modification necessary  REHAB POTENTIAL: Good  EVALUATION COMPLEXITY: Low  PLAN: OT FREQUENCY: 1x/week  OT DURATION: 12 weeks (anticipate d/c after 6 weeks)  PLANNED INTERVENTIONS: self care/ADL training, therapeutic exercise, therapeutic activity, neuromuscular re-education, manual therapy, gait training, balance training, functional mobility training, moist heat, cryotherapy, contrast bath, patient/family  education, cognitive remediation/compensation, visual/perceptual remediation/compensation, coping strategies training, DME and/or AE instructions, and Re-evaluation  RECOMMENDED OTHER SERVICES: PT  CONSULTED AND AGREED WITH PLAN OF CARE: Patient and family member/caregiver  PLAN FOR NEXT SESSION:  update/check progress towards goals, continue to  monitor BP, coordination, visual perceptual skills (environmental scanning, functional scanning), simple cooking task   [facility], OTR/L 02/06/2022, 12:16 PM

## 2022-02-13 ENCOUNTER — Ambulatory Visit (INDEPENDENT_AMBULATORY_CARE_PROVIDER_SITE_OTHER): Payer: Medicare HMO

## 2022-02-13 DIAGNOSIS — I639 Cerebral infarction, unspecified: Secondary | ICD-10-CM | POA: Diagnosis not present

## 2022-02-13 LAB — CUP PACEART REMOTE DEVICE CHECK
Date Time Interrogation Session: 20231015160340
Implantable Pulse Generator Implant Date: 20230825

## 2022-02-14 ENCOUNTER — Ambulatory Visit: Payer: Medicare HMO | Admitting: Occupational Therapy

## 2022-02-14 DIAGNOSIS — M6281 Muscle weakness (generalized): Secondary | ICD-10-CM | POA: Diagnosis not present

## 2022-02-14 DIAGNOSIS — R2689 Other abnormalities of gait and mobility: Secondary | ICD-10-CM

## 2022-02-14 DIAGNOSIS — R278 Other lack of coordination: Secondary | ICD-10-CM

## 2022-02-14 DIAGNOSIS — R41842 Visuospatial deficit: Secondary | ICD-10-CM

## 2022-02-14 DIAGNOSIS — R41844 Frontal lobe and executive function deficit: Secondary | ICD-10-CM

## 2022-02-14 DIAGNOSIS — R2681 Unsteadiness on feet: Secondary | ICD-10-CM

## 2022-02-14 DIAGNOSIS — R41840 Attention and concentration deficit: Secondary | ICD-10-CM

## 2022-02-14 NOTE — Therapy (Signed)
OUTPATIENT OCCUPATIONAL THERAPY NEURO Treatment  Patient Name: Heather Gross MRN: [id number] DOB:01/11/1946, 76 y.o., female Today's Date: 02/14/2022  PCP:  Kim Nguyen NP REFERRING PROVIDER: Dr. Pokhrel   OT End of Session - 02/14/22 1208     Visit Number 7    Number of Visits 12    Date for OT Re-Evaluation 02/28/22    Authorization Type [employer]    Authorization - Visit Number 7    Progress Note Due on Visit 10    OT Start Time 1103    OT Stop Time 1144    OT Time Calculation (min) 41 min    Activity Tolerance Patient tolerated treatment well    Behavior During Therapy WFL for tasks assessed/performed           OCCUPATIONAL THERAPY DISCHARGE SUMMARY    Current functional level related to goals / functional outcomes: Pt made good overall progress, however she did not fully achieve all goals due to cognitive and visual perceptual deficits.   Remaining deficits: Cognitive deficits, visual perceptual deficits, decreased balance.   Education / Equipment: Pt/ husband were instructed in HEP's and safety for cooking(with supervision only). Therapist recommends pt does not drive. Pt/ husband verbalize understanding of all education.  Patient agrees to discharge. Patient goals were partially met. Patient is being discharged due to the patient's request..         Past Medical History:  Diagnosis Date   Glaucoma    Hyperlipidemia    Hypertension    Stroke ([facility])    Past Surgical History:  Procedure Laterality Date   BREAST BIOPSY Right 09/29/2015   BREAST EXCISIONAL BIOPSY Right 11/04/2015   papilloma   BREAST LUMPECTOMY WITH RADIOACTIVE SEED LOCALIZATION Right 11/04/2015   Procedure: BREAST LUMPECTOMY WITH RADIOACTIVE SEED LOCALIZATION;  Surgeon: Paul Toth III, MD;  Location: [facility];  Service: General;  Laterality: Right;   LIPOMA EXCISION     Patient Active Problem List   Diagnosis Date Noted   Family history of malignant neoplasm of  digestive organs 10/28/2021   Cerebrovascular disease 09/13/2021   Acute ischemic stroke ([facility]) 09/01/2021   LBBB (left bundle branch block) 09/01/2021   Obesity (BMI 30-39.9) 12/30/2019   Stage 3a chronic kidney disease (CKD) ([facility]) -Baseline Scr 1.3-1.6 02/05/2017   Osteopenia 11/02/2016   Vitamin D deficiency 12/10/2014   Hyperlipidemia 06/11/2014   Type 2 diabetes mellitus with diabetic chronic kidney disease ([facility])    Glaucoma    Essential hypertension 06/28/2006   Gastroesophageal reflux disease 06/28/2006   ECZEMA, ATOPIC DERMATITIS 06/28/2006    ONSET DATE: 11/07/21  REFERRING DIAG: CVA  THERAPY DIAG:  Muscle weakness (generalized)  Unsteadiness on feet  Other abnormalities of gait and mobility  Frontal lobe and executive function deficit  Attention and concentration deficit  Visuospatial deficit  Other lack of coordination  Rationale for Evaluation and Treatment Rehabilitation  SUBJECTIVE:   SUBJECTIVE STATEMENT:  No reports of diplopia today.  Pt reports that she goes to the eye doctor next Friday.  Husband reports that he cooked 1 time and did well, but that he thinks that pt is "enjoying not cooking."  Pt accompanied by: husband  PERTINENT HISTORY: 76 yo female presenting to ED on 7/10 with diplopia. Recently came to ED on 7/8 for diplopia, MRI was ordered but wait was too long so pt left. 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   PRECAUTIONS: Fall  WEIGHT BEARING RESTRICTIONS No    PAIN:  Are you having pain? No  FALLS: Has patient fallen in last 6 months? No  LIVING ENVIRONMENT: Lives with: lives with their spouse Lives in: House/apartment Stairs: No Has following equipment at home: Single point cane  PLOF: Independent  PATIENT GOALS to get back to myself, to drive  OBJECTIVE:   VISION (from eval): Subjective report: Pt reports diplopia with fatigue Baseline vision: Wears glasses all the  time Visual history: glaucoma per pt report  VISION ASSESSMENT: Impaired Eye alignment: WFL Tracking/Visual pursuits: Able to track stimulus in all quads without difficulty Visual Fields: no apparent deficits  Patient has difficulty with following activities due to following visual impairments: reading, sometimes too blurry   OBSERVATIONS (from eval): Pt requires increased time and v.c for commands   TODAY'S TREATMENT:   BP: 114/68    Checked progress towards goals and discussed with pt/ husband.    Environmental Scanning/navigation (ambulating) in min distracting  88%, 14/16 located first pass. Pt able to locate remaining items on second pass with min cueing.  Discussed errors and need to perform head turns to compensate for visual deficits.  Pt with no LOB.  Therapist recommends pt does not drive due to risk for injury.  Completing grooved Pegboard with L hand with min difficulty for incr coordination, then removing with in hand manipulation.  Pt simulated stepping over into tub, with 1 hand UE support as pt has grab bar min A to transfer in and out of tub, pt also simulated sitting down on tub seat and swinging legs into tub, pt was safer with this task, however therapist recommends supervision/ min A with all tub transfers for safety.  Standing to place and remove graded clothespins from vertical antennae 1-8# with LUE, min difficulty  HOME EXERCISE PROGRAM: 12/27/21- vision HEP 01/10/22 Coordination HEP, memory compensations   GOALS:   SHORT TERM GOALS: Target date: 01/05/22   I with HEP for vision Baseline: Goal status: met  Pt reports performing daily  2.  I with HEP for LUE coordination Baseline:  Goal status: met Pt reports performing daily.  3.  Pt will verbalize understanding of compensatory strategies for short term memory/ cognitive deficits. Baseline:  Goal status: met, handout issued  4.  Pt/ husband will verbalize understanding of AE recommendations  for increased safety with shower transfers/ shower.(Ie: tub transfer bench) Baseline:  Goal status:met, pt got grab bars , pt ordered tub bench but she states it is too large, pt's son to order smaller tub seat. Pt practiced stepping into simulated tub with therapist and then sitting and turning around on tub bench   LONG TERM GOALS: Target date: 02/28/22  Pt will perform basic cooking with supervision demonstrating good safety awareness. Goal status: partially met, min v.c in clinic, pt's husband reports pt is performing with supervision at home for simple tasks  2.  Pt will perform home management activities demonstrating good safety awareness. Goal status: MET  01/31/22:  making beds, doing laundry.  02/06/22:  met per pt/husband report  3.  Pt report ability to consistently read newsprint without diplopia Goal status: MET  ASSESSMENT:  CLINICAL IMPRESSION:  Pt is progressing towards goals.  Pt made good overall progress and she requests d/c from OT.  PERFORMANCE DEFICITS in functional skills including ADLs, IADLs, coordination, dexterity, FMC, mobility, balance, decreased knowledge of precautions, decreased knowledge of use of DME, vision, and UE functional use, cognitive skills including attention, learn, memory, problem solving, safety awareness, sequencing, thought, and understand,  and psychosocial skills including coping strategies, environmental adaptation, habits, interpersonal interactions, and routines and behaviors.   IMPAIRMENTS are limiting patient from ADLs, IADLs, rest and sleep, play, leisure, and social participation.   COMORBIDITIES may have co-morbidities  that affects occupational performance. Patient will benefit from skilled OT to address above impairments and improve overall function.  MODIFICATION OR ASSISTANCE TO COMPLETE EVALUATION: Min-Moderate modification of tasks or assist with assess necessary to complete an evaluation.  OT OCCUPATIONAL PROFILE AND HISTORY:  Detailed assessment: Review of records and additional review of physical, cognitive, psychosocial history related to current functional performance.  CLINICAL DECISION MAKING: LOW - limited treatment options, no task modification necessary  REHAB POTENTIAL: Good  EVALUATION COMPLEXITY: Low  PLAN: OT FREQUENCY: 1x/week  OT DURATION: 12 weeks (anticipate d/c after 6 weeks)  PLANNED INTERVENTIONS: self care/ADL training, therapeutic exercise, therapeutic activity, neuromuscular re-education, manual therapy, gait training, balance training, functional mobility training, moist heat, cryotherapy, contrast bath, patient/family education, cognitive remediation/compensation, visual/perceptual remediation/compensation, coping strategies training, DME and/or AE instructions, and Re-evaluation  RECOMMENDED OTHER SERVICES: PT  CONSULTED AND AGREED WITH PLAN OF CARE: Patient and family member/caregiver  PLAN FOR NEXT SESSION:  d/c OT   RINE,KATHRYN, OTR/L 02/14/2022, 12:14 PM           

## 2022-02-20 ENCOUNTER — Telehealth: Payer: Self-pay

## 2022-02-20 NOTE — Telephone Encounter (Signed)
VMT pt requesting call back reference starting PREP on 02/27/22

## 2022-03-06 NOTE — Progress Notes (Signed)
[facility] Summary Report / Loop Recorder 

## 2022-03-20 ENCOUNTER — Ambulatory Visit (INDEPENDENT_AMBULATORY_CARE_PROVIDER_SITE_OTHER): Payer: Medicare HMO

## 2022-03-20 DIAGNOSIS — I639 Cerebral infarction, unspecified: Secondary | ICD-10-CM

## 2022-03-21 LAB — CUP PACEART REMOTE DEVICE CHECK
Date Time Interrogation Session: 20231119231707
Implantable Pulse Generator Implant Date: 20230825

## 2022-03-29 ENCOUNTER — Ambulatory Visit (HOSPITAL_BASED_OUTPATIENT_CLINIC_OR_DEPARTMENT_OTHER): Payer: Medicare HMO | Admitting: Cardiovascular Disease

## 2022-04-18 ENCOUNTER — Ambulatory Visit: Payer: Medicare HMO | Admitting: Adult Health

## 2022-04-25 ENCOUNTER — Ambulatory Visit (INDEPENDENT_AMBULATORY_CARE_PROVIDER_SITE_OTHER): Payer: Medicare HMO

## 2022-04-25 DIAGNOSIS — I639 Cerebral infarction, unspecified: Secondary | ICD-10-CM | POA: Diagnosis not present

## 2022-04-25 LAB — CUP PACEART REMOTE DEVICE CHECK
Date Time Interrogation Session: 20231225230842
Implantable Pulse Generator Implant Date: 20230825

## 2022-05-03 NOTE — Progress Notes (Signed)
[facility] Summary Report / Loop Recorder 

## 2022-05-08 ENCOUNTER — Ambulatory Visit: Payer: Medicare HMO | Admitting: Neurology

## 2022-05-08 ENCOUNTER — Encounter: Payer: Self-pay | Admitting: Neurology

## 2022-05-08 VITALS — BP 134/77 | HR 91 | Ht 65.0 in | Wt 190.0 lb

## 2022-05-08 DIAGNOSIS — I6381 Other cerebral infarction due to occlusion or stenosis of small artery: Secondary | ICD-10-CM | POA: Diagnosis not present

## 2022-05-08 DIAGNOSIS — I69398 Other sequelae of cerebral infarction: Secondary | ICD-10-CM

## 2022-05-08 DIAGNOSIS — R269 Unspecified abnormalities of gait and mobility: Secondary | ICD-10-CM | POA: Diagnosis not present

## 2022-05-08 NOTE — Progress Notes (Signed)
PATIENT: Heather Gross DOB: 1945/07/29  REASON FOR VISIT: follow up HISTORY FROM: patient PRIMARY NEUROLOGIST: Dr. Leonie Man  Chief Complaint  Patient presents with   Follow-up    Rm 17 with husband Heather Gross. Reports 3 events of low blood pressure and almost passing out.    Update 05/08/2022 : She returns for follow-up after last visit with [name], NP on 12/28/2021.  Patient states she continues to have some gait and balance difficulties.  Walking is still wobbly but she has had no falls or injuries.  She uses a cane.  He is tolerating Plavix well without bleeding or bruising.  Her blood pressure is under good control.  Blood sugars have also been fine.  She is tolerating Crestor well without muscle aches and pains.  Diplopia resolved.  Loop recorder so far has not yet shown paroxysmal A-fib.  He has no new complaints today.  Hemoglobin A1c on 11/08/2021 was elevated at 7.0.   12/28/2021 [name], NP )Heather Gross is a 77 year old female with a history of stroke.  She returns today for follow-up.  Patient had a stroke back in May and then went back into the hospital in July for double vision.  She is now only on Plavix 75 mg daily.Now has a loop recorder. Diplopia comes and goes. Only in the left eye. Will be seeing an opthamologist this month 9/16.  Overall she has been doing well since her hospitalization.  Reports that she is trying to eat healthy and walk daily.  She returns today for an evaluation.  HISTORY Ms. Heather Gross is a 77 y.o. female with history of diabetes, hypertension, hyperlipidemia, CKD admitted for double vision. No tPA given due to outside window.     Stroke: Multifocal infarct, embolic, concerning for occult A-fib MRI left dorsal pontine small infarct, right paramedian midbrain infarct, left temporal and bilateral frontal small infarcts. MRA bilateral siphon, right M2 atherosclerosis Carotid Doppler unremarkable 2D Echo EF 60 to 65% LE venous Doppler negative  for DVT EP recommend cardiac event monitoring given overnight several runs of atrial tachycardia. LDL 28 HgbA1c 7.0 Lovenox for VTE prophylaxis clopidogrel 75 mg daily prior to admission, now on aspirin 81 mg daily and Brilinta (ticagrelor) 90 mg bid for 30 days and then Plavix alone. Patient counseled to be compliant with her antithrombotic medications Ongoing aggressive stroke risk factor management Therapy recommendations: Home health PT and outpatient OT Disposition: Pending  History of stroke 09/01/2021 admitted for falling and difficulty speaking.  MRI showed right frontoparietal white matter small infarct.  EF 55 to 60%, LDL 60, A1c 6.5, CTA head and neck showed bilateral siphon, A3 and the right M2 stenosis.  Discharged on DAPT and Crestor 40 09/14/2021 admitted again for confusion, speech difficulty.  CT PE negative.  MRI showed subacute left thalamic infarct.  CTA head and neck unchanged.  Discharged on aspirin and Brilinta.  REVIEW OF SYSTEMS: Out of a complete 14 system review of symptoms, the patient complains only of the following symptoms, and all other reviewed systems are negative.  ALLERGIES: Allergies  Allergen Reactions   Lipitor [Atorvastatin] Other (See Comments)    Made mouth very dry and the lips discolored   Shellfish-Derived Products Other (See Comments)    Hands break out   Latex Rash    HOME MEDICATIONS: Outpatient Medications Prior to Visit  Medication Sig Dispense Refill   Calcium 500 MG CHEW Chew 2 tablets (1,000 mg total) by mouth daily. 30 tablet 0  clopidogrel (PLAVIX) 75 MG tablet Take 1 tablet (75 mg total) by mouth daily. Start on December 11, 2021 30 tablet 11   [employer] D3 2000 units CAPS TAKE 1 CAPSULE BY MOUTH EVERY DAY (Patient taking differently: Take 2,000 Units by mouth daily after breakfast.) 100 capsule 2   ezetimibe (ZETIA) 10 MG tablet Take 10 mg by mouth daily.     fluticasone (FLONASE) 50 MCG/ACT nasal spray Place 1-2 sprays into both  nostrils daily as needed for allergies or rhinitis.     metFORMIN (GLUCOPHAGE) 500 MG tablet TAKE 1 TABLET BY MOUTH AT BEDTIME. (Patient taking differently: Take 500 mg by mouth daily with breakfast.) 30 tablet 0   Multiple Vitamin (MULTIVITAMIN) tablet Take 1 tablet by mouth daily with breakfast.     Olmesartan-amLODIPine-HCTZ 40-5-25 MG TABS Take 1 tablet by mouth daily. 90 tablet 3   pantoprazole (PROTONIX) 20 MG tablet Take 1 tablet (20 mg total) by mouth daily. (Patient taking differently: Take 20 mg by mouth daily as needed for indigestion.) 30 tablet 3   rosuvastatin (CRESTOR) 40 MG tablet Take 40 mg by mouth at bedtime.     No facility-administered medications prior to visit.    PAST MEDICAL HISTORY: Past Medical History:  Diagnosis Date   Glaucoma    Hyperlipidemia    Hypertension    Stroke [facility])     PAST SURGICAL HISTORY: Past Surgical History:  Procedure Laterality Date   BREAST BIOPSY Right 09/29/2015   BREAST EXCISIONAL BIOPSY Right 11/04/2015   papilloma   BREAST LUMPECTOMY WITH RADIOACTIVE SEED LOCALIZATION Right 11/04/2015   Procedure: BREAST LUMPECTOMY WITH RADIOACTIVE SEED LOCALIZATION;  Surgeon: Autumn Messing III, MD;  Location: [facility];  Service: General;  Laterality: Right;   LIPOMA EXCISION      FAMILY HISTORY: Family History  Problem Relation Age of Onset   Heart disease Father    Diabetes Sister    Breast cancer Sister    Diabetes Sister    Diabetes Sister    Diabetes Sister    Diabetes Sister    Diabetes Brother     SOCIAL HISTORY: Social History   Socioeconomic History   Marital status: Married    Spouse name: Not on file   Number of children: Not on file   Years of education: Not on file   Highest education level: Not on file  Occupational History   Not on file  Tobacco Use   Smoking status: Former    Types: Cigarettes    Quit date: 05/01/1990    Years since quitting: 32.0   Smokeless tobacco: Never  Substance and Sexual  Activity   Alcohol use: No   Drug use: No   Sexual activity: Not Currently  Other Topics Concern   Not on file  Social History Narrative   Entered 06/2014:    Married x 48 years.   2 Children--2 sons   Social Determinants of Health   Financial Resource Strain: Low Risk  (10/24/2019)   Overall Financial Resource Strain (CARDIA)    Difficulty of Paying Living Expenses: Not very hard  Food Insecurity: Not on file  Transportation Needs: Not on file  Physical Activity: Not on file  Stress: Not on file  Social Connections: Not on file  Intimate Partner Violence: Not on file      PHYSICAL EXAM  Vitals:   05/08/22 1406  BP: 134/77  Pulse: 91  Weight: 190 lb (86.2 kg)  Height: '5\' 5"'$  (1.651 m)  Body mass index is 31.62 kg/m.  Generalized: Well developed, in no acute distress   Neurological examination  Mentation: Alert oriented to time, place, history taking. Follows all commands speech and language fluent Cranial nerve II-XII: Pupils were equal round reactive to light. Extraocular movements were full, visual field were full on confrontational test. Facial sensation and strength were normal. Head turning and shoulder shrug  were normal and symmetric. Motor: The motor testing reveals 5 over 5 strength of all 4 extremities. Diminished fine finger movements on the right and orbits left or right upper extremity.  Good symmetric motor tone is noted throughout.  Sensory: Sensory testing is intact to soft touch on all 4 extremities. No evidence of extinction is noted.  Coordination: Cerebellar testing reveals good finger-nose-finger and heel-to-shin bilaterally.  Gait and station: Gait is cautious with slight dragging of the right leg.   DIAGNOSTIC DATA (LABS, IMAGING, TESTING) - I reviewed patient records, labs, notes, testing and imaging myself where available.  Lab Results  Component Value Date   WBC 8.0 01/10/2022   HGB 11.6 (L) 01/10/2022   HCT 36.0 01/10/2022   MCV 98.1  01/10/2022   PLT 186 01/10/2022      Component Value Date/Time   NA 142 01/10/2022 1830   K 4.4 01/10/2022 1830   CL 106 01/10/2022 1830   CO2 25 01/10/2022 1830   GLUCOSE 116 (H) 01/10/2022 1830   BUN 20 01/10/2022 1830   CREATININE 1.44 (H) 01/10/2022 1830   CREATININE 1.12 (H) 12/30/2019 0821   CALCIUM 10.2 01/10/2022 1830   PROT 7.3 11/06/2021 1801   ALBUMIN 3.9 11/06/2021 1801   AST 26 11/06/2021 1801   ALT 27 11/06/2021 1801   ALKPHOS 43 11/06/2021 1801   BILITOT 0.5 11/06/2021 1801   GFRNONAA 38 (L) 01/10/2022 1830   GFRNONAA 49 (L) 12/30/2019 0821   GFRAA 56 (L) 12/30/2019 0821   Lab Results  Component Value Date   CHOL 91 11/08/2021   HDL 30 (L) 11/08/2021   LDLCALC 28 11/08/2021   LDLDIRECT 88.9 08/13/2006   TRIG 166 (H) 11/08/2021   CHOLHDL 3.0 11/08/2021   Lab Results  Component Value Date   HGBA1C 7.0 (H) 11/08/2021   No results found for: "VITAMINB12" Lab Results  Component Value Date   TSH 0.768 09/01/2021      ASSESSMENT AND PLAN 77 y.o. year old female  has a past medical history of Glaucoma, Hyperlipidemia, Hypertension, and Stroke ([place]). here with:  Stroke: Multifocal small subcortical infarcts in July 1517, embolic, concerning for occult A-fib   I had a long d/w patient about his recent stroke, risk for recurrent stroke/TIAs, personally independently reviewed imaging studies and stroke evaluation results and answered questions.Continue Plavix (clopidogrel) 75 mg daily  for secondary stroke prevention and maintain strict control of hypertension with blood pressure goal below 130/90, diabetes with hemoglobin A1c goal below 6.5% and lipids with LDL cholesterol goal below 70 mg/dL. I also advised the patient to eat a healthy diet with plenty of whole grains, cereals, fruits and vegetables, exercise regularly and maintain ideal body weight she was advised to use her cane at all times and we discussed fall precautions.  Followup in the future with [name], NP in 6 months or call earlier if necessary. Greater than 50% time during this 35-minute visit was spent in counseling and coordination of care about her multiple lacunar strokes and discussion of stroke evaluation, prevention, treatment and answering questions.Antony Contras, MD  05/08/2022, 2:35 PM [facility] [address], [place], [place] [address] [phone]

## 2022-05-08 NOTE — Patient Instructions (Signed)
I had a long d/w patient about his recent stroke, risk for recurrent stroke/TIAs, personally independently reviewed imaging studies and stroke evaluation results and answered questions.Continue Plavix (clopidogrel) 75 mg daily  for secondary stroke prevention and maintain strict control of hypertension with blood pressure goal below 130/90, diabetes with hemoglobin A1c goal below 6.5% and lipids with LDL cholesterol goal below 70 mg/dL. I also advised the patient to eat a healthy diet with plenty of whole grains, cereals, fruits and vegetables, exercise regularly and maintain ideal body weight she was advised to use her cane at all times and we discussed fall precautions.  Followup in the future with [name], NP in 6 months or call earlier if necessary.  Fall Prevention in the Home, Adult Falls can cause injuries and can happen to people of all ages. There are many things you can do to make your home safe and to help prevent falls. Ask for help when making these changes. What actions can I take to prevent falls? General Instructions Use good lighting in all rooms. Replace any light bulbs that burn out. Turn on the lights in dark areas. Use night-lights. Keep items that you use often in easy-to-reach places. Lower the shelves around your home if needed. Set up your furniture so you have a clear path. Avoid moving your furniture around. Do not have throw rugs or other things on the floor that can make you trip. Avoid walking on wet floors. If any of your floors are uneven, fix them. Add color or contrast paint or tape to clearly mark and help you see: Grab bars or handrails. First and last steps of staircases. Where the edge of each step is. If you use a stepladder: Make sure that it is fully opened. Do not climb a closed stepladder. Make sure the sides of the stepladder are locked in place. Ask someone to hold the stepladder while you use it. Know where your pets are when moving through  your home. What can I do in the bathroom?     Keep the floor dry. Clean up any water on the floor right away. Remove soap buildup in the tub or shower. Use nonskid mats or decals on the floor of the tub or shower. Attach bath mats securely with double-sided, nonslip rug tape. If you need to sit down in the shower, use a plastic, nonslip stool. Install grab bars by the toilet and in the tub and shower. Do not use towel bars as grab bars. What can I do in the bedroom? Make sure that you have a light by your bed that is easy to reach. Do not use any sheets or blankets for your bed that hang to the floor. Have a firm chair with side arms that you can use for support when you get dressed. What can I do in the kitchen? Clean up any spills right away. If you need to reach something above you, use a step stool with a grab bar. Keep electrical cords out of the way. Do not use floor polish or wax that makes floors slippery. What can I do with my stairs? Do not leave any items on the stairs. Make sure that you have a light switch at the top and the bottom of the stairs. Make sure that there are handrails on both sides of the stairs. Fix handrails that are broken or loose. Install nonslip stair treads on all your stairs. Avoid having throw rugs at the top or bottom of the stairs.  Choose a carpet that does not hide the edge of the steps on the stairs. Check carpeting to make sure that it is firmly attached to the stairs. Fix carpet that is loose or worn. What can I do on the outside of my home? Use bright outdoor lighting. Fix the edges of walkways and driveways and fix any cracks. Remove anything that might make you trip as you walk through a door, such as a raised step or threshold. Trim any bushes or trees on paths to your home. Check to see if handrails are loose or broken and that both sides of all steps have handrails. Install guardrails along the edges of any raised decks and  porches. Clear paths of anything that can make you trip, such as tools or rocks. Have leaves, snow, or ice cleared regularly. Use sand or salt on paths during winter. Clean up any spills in your garage right away. This includes grease or oil spills. What other actions can I take? Wear shoes that: Have a low heel. Do not wear high heels. Have rubber bottoms. Feel good on your feet and fit well. Are closed at the toe. Do not wear open-toe sandals. Use tools that help you move around if needed. These include: Canes. Walkers. Scooters. Crutches. Review your medicines with your doctor. Some medicines can make you feel dizzy. This can increase your chance of falling. Ask your doctor what else you can do to help prevent falls. Where to find more information Centers for Disease Control and Prevention, [employer]: http://www.wolf.info/ National Institute on Aging: http://kim-miller.com/ Contact a doctor if: You are afraid of falling at home. You feel weak, drowsy, or dizzy at home. You fall at home. Summary There are many simple things that you can do to make your home safe and to help prevent falls. Ways to make your home safe include removing things that can make you trip and installing grab bars in the bathroom. Ask for help when making these changes in your home. This information is not intended to replace advice given to you by your health care provider. Make sure you discuss any questions you have with your health care provider. Document Revised: 01/17/2021 Document Reviewed: 11/19/2019 Elsevier Patient Education  [place].

## 2022-05-18 NOTE — Progress Notes (Signed)
[facility] Summary Report / Loop Recorder

## 2022-05-29 ENCOUNTER — Ambulatory Visit: Payer: Medicare HMO

## 2022-05-29 DIAGNOSIS — I639 Cerebral infarction, unspecified: Secondary | ICD-10-CM | POA: Diagnosis not present

## 2022-05-30 LAB — CUP PACEART REMOTE DEVICE CHECK
Date Time Interrogation Session: 20240127230506
Implantable Pulse Generator Implant Date: 20230825

## 2022-07-03 ENCOUNTER — Ambulatory Visit (INDEPENDENT_AMBULATORY_CARE_PROVIDER_SITE_OTHER): Payer: Medicare HMO

## 2022-07-03 DIAGNOSIS — I639 Cerebral infarction, unspecified: Secondary | ICD-10-CM

## 2022-07-03 LAB — CUP PACEART REMOTE DEVICE CHECK
Date Time Interrogation Session: 20240303232131
Implantable Pulse Generator Implant Date: 20230825

## 2022-07-12 NOTE — Progress Notes (Signed)
[facility] Summary Report / Loop Recorder 

## 2022-07-20 ENCOUNTER — Ambulatory Visit
Admission: RE | Admit: 2022-07-20 | Discharge: 2022-07-20 | Disposition: A | Discharge status: Home / Self Care | Payer: Medicare HMO | Source: Ambulatory Visit | Attending: Family | Admitting: Family

## 2022-07-20 DIAGNOSIS — I129 Hypertensive chronic kidney disease with stage 1 through stage 4 chronic kidney disease, or unspecified chronic kidney disease: Secondary | ICD-10-CM

## 2022-07-20 DIAGNOSIS — N1831 Chronic kidney disease, stage 3a: Secondary | ICD-10-CM

## 2022-08-07 ENCOUNTER — Ambulatory Visit (INDEPENDENT_AMBULATORY_CARE_PROVIDER_SITE_OTHER): Payer: Medicare HMO

## 2022-08-07 DIAGNOSIS — I639 Cerebral infarction, unspecified: Secondary | ICD-10-CM | POA: Diagnosis not present

## 2022-08-07 LAB — CUP PACEART REMOTE DEVICE CHECK
Date Time Interrogation Session: 20240407231546
Implantable Pulse Generator Implant Date: 20230825

## 2022-08-10 NOTE — Progress Notes (Signed)
[facility] Summary Report / Loop Recorder 

## 2022-09-11 ENCOUNTER — Ambulatory Visit (INDEPENDENT_AMBULATORY_CARE_PROVIDER_SITE_OTHER): Payer: Medicare HMO

## 2022-09-11 DIAGNOSIS — I639 Cerebral infarction, unspecified: Secondary | ICD-10-CM | POA: Diagnosis not present

## 2022-09-11 LAB — CUP PACEART REMOTE DEVICE CHECK
Date Time Interrogation Session: 20240512231454
Implantable Pulse Generator Implant Date: 20230825

## 2022-09-13 NOTE — Progress Notes (Signed)
[facility] Summary Report / Loop Recorder 

## 2022-09-28 ENCOUNTER — Emergency Department (HOSPITAL_COMMUNITY): Payer: Medicare HMO

## 2022-09-28 ENCOUNTER — Inpatient Hospital Stay (HOSPITAL_COMMUNITY)
Admission: EM | Admit: 2022-09-28 | Discharge: 2022-10-01 | DRG: 682 | Disposition: A | Discharge status: Home / Self Care | Payer: Medicare HMO | Attending: Internal Medicine | Admitting: Internal Medicine

## 2022-09-28 ENCOUNTER — Other Ambulatory Visit: Payer: Self-pay

## 2022-09-28 DIAGNOSIS — R0602 Shortness of breath: Secondary | ICD-10-CM | POA: Diagnosis not present

## 2022-09-28 DIAGNOSIS — D696 Thrombocytopenia, unspecified: Secondary | ICD-10-CM | POA: Diagnosis present

## 2022-09-28 DIAGNOSIS — K219 Gastro-esophageal reflux disease without esophagitis: Secondary | ICD-10-CM | POA: Diagnosis present

## 2022-09-28 DIAGNOSIS — R0902 Hypoxemia: Secondary | ICD-10-CM

## 2022-09-28 DIAGNOSIS — Z79899 Other long term (current) drug therapy: Secondary | ICD-10-CM

## 2022-09-28 DIAGNOSIS — I129 Hypertensive chronic kidney disease with stage 1 through stage 4 chronic kidney disease, or unspecified chronic kidney disease: Secondary | ICD-10-CM | POA: Diagnosis present

## 2022-09-28 DIAGNOSIS — N1831 Chronic kidney disease, stage 3a: Secondary | ICD-10-CM | POA: Diagnosis present

## 2022-09-28 DIAGNOSIS — Z8673 Personal history of transient ischemic attack (TIA), and cerebral infarction without residual deficits: Secondary | ICD-10-CM

## 2022-09-28 DIAGNOSIS — E872 Acidosis, unspecified: Secondary | ICD-10-CM

## 2022-09-28 DIAGNOSIS — Z7984 Long term (current) use of oral hypoglycemic drugs: Secondary | ICD-10-CM

## 2022-09-28 DIAGNOSIS — Z5982 Transportation insecurity: Secondary | ICD-10-CM

## 2022-09-28 DIAGNOSIS — E1122 Type 2 diabetes mellitus with diabetic chronic kidney disease: Secondary | ICD-10-CM | POA: Diagnosis present

## 2022-09-28 DIAGNOSIS — Z6831 Body mass index (BMI) 31.0-31.9, adult: Secondary | ICD-10-CM

## 2022-09-28 DIAGNOSIS — Z7902 Long term (current) use of antithrombotics/antiplatelets: Secondary | ICD-10-CM

## 2022-09-28 DIAGNOSIS — Z833 Family history of diabetes mellitus: Secondary | ICD-10-CM

## 2022-09-28 DIAGNOSIS — B952 Enterococcus as the cause of diseases classified elsewhere: Secondary | ICD-10-CM | POA: Diagnosis present

## 2022-09-28 DIAGNOSIS — E669 Obesity, unspecified: Secondary | ICD-10-CM | POA: Diagnosis present

## 2022-09-28 DIAGNOSIS — N179 Acute kidney failure, unspecified: Secondary | ICD-10-CM | POA: Diagnosis not present

## 2022-09-28 DIAGNOSIS — H409 Unspecified glaucoma: Secondary | ICD-10-CM | POA: Diagnosis present

## 2022-09-28 DIAGNOSIS — Z8249 Family history of ischemic heart disease and other diseases of the circulatory system: Secondary | ICD-10-CM

## 2022-09-28 DIAGNOSIS — J9601 Acute respiratory failure with hypoxia: Secondary | ICD-10-CM | POA: Diagnosis present

## 2022-09-28 DIAGNOSIS — Z91040 Latex allergy status: Secondary | ICD-10-CM

## 2022-09-28 DIAGNOSIS — N390 Urinary tract infection, site not specified: Secondary | ICD-10-CM | POA: Diagnosis present

## 2022-09-28 DIAGNOSIS — E876 Hypokalemia: Secondary | ICD-10-CM | POA: Diagnosis present

## 2022-09-28 DIAGNOSIS — B9620 Unspecified Escherichia coli [E. coli] as the cause of diseases classified elsewhere: Secondary | ICD-10-CM | POA: Diagnosis present

## 2022-09-28 DIAGNOSIS — E785 Hyperlipidemia, unspecified: Secondary | ICD-10-CM

## 2022-09-28 DIAGNOSIS — R001 Bradycardia, unspecified: Secondary | ICD-10-CM | POA: Diagnosis not present

## 2022-09-28 DIAGNOSIS — I447 Left bundle-branch block, unspecified: Secondary | ICD-10-CM | POA: Diagnosis present

## 2022-09-28 DIAGNOSIS — I10 Essential (primary) hypertension: Secondary | ICD-10-CM | POA: Diagnosis present

## 2022-09-28 DIAGNOSIS — R6510 Systemic inflammatory response syndrome (SIRS) of non-infectious origin without acute organ dysfunction: Secondary | ICD-10-CM | POA: Diagnosis not present

## 2022-09-28 DIAGNOSIS — Z91013 Allergy to seafood: Secondary | ICD-10-CM

## 2022-09-28 DIAGNOSIS — Z1152 Encounter for screening for COVID-19: Secondary | ICD-10-CM

## 2022-09-28 DIAGNOSIS — Z87891 Personal history of nicotine dependence: Secondary | ICD-10-CM

## 2022-09-28 DIAGNOSIS — D631 Anemia in chronic kidney disease: Secondary | ICD-10-CM | POA: Diagnosis present

## 2022-09-28 DIAGNOSIS — F410 Panic disorder [episodic paroxysmal anxiety] without agoraphobia: Secondary | ICD-10-CM | POA: Diagnosis present

## 2022-09-28 DIAGNOSIS — I480 Paroxysmal atrial fibrillation: Secondary | ICD-10-CM | POA: Diagnosis present

## 2022-09-28 DIAGNOSIS — Z888 Allergy status to other drugs, medicaments and biological substances status: Secondary | ICD-10-CM

## 2022-09-28 DIAGNOSIS — I471 Supraventricular tachycardia, unspecified: Secondary | ICD-10-CM | POA: Diagnosis present

## 2022-09-28 LAB — CBC WITH DIFFERENTIAL/PLATELET
Abs Immature Granulocytes: 0.02 10*3/uL (ref 0.00–0.07)
Basophils Absolute: 0 10*3/uL (ref 0.0–0.1)
Basophils Relative: 1 %
Eosinophils Absolute: 0.1 10*3/uL (ref 0.0–0.5)
Eosinophils Relative: 1 %
HCT: 34.8 % — ABNORMAL LOW (ref 36.0–46.0)
Hemoglobin: 11.2 g/dL — ABNORMAL LOW (ref 12.0–15.0)
Immature Granulocytes: 0 %
Lymphocytes Relative: 29 %
Lymphs Abs: 2.1 10*3/uL (ref 0.7–4.0)
MCH: 29.9 pg (ref 26.0–34.0)
MCHC: 32.2 g/dL (ref 30.0–36.0)
MCV: 93 fL (ref 80.0–100.0)
Monocytes Absolute: 0.6 10*3/uL (ref 0.1–1.0)
Monocytes Relative: 8 %
Neutro Abs: 4.4 10*3/uL (ref 1.7–7.7)
Neutrophils Relative %: 61 %
Platelets: 176 10*3/uL (ref 150–400)
RBC: 3.74 MIL/uL — ABNORMAL LOW (ref 3.87–5.11)
RDW: 12.4 % (ref 11.5–15.5)
WBC: 7.1 10*3/uL (ref 4.0–10.5)
nRBC: 0 % (ref 0.0–0.2)

## 2022-09-28 LAB — COMPREHENSIVE METABOLIC PANEL
ALT: 18 U/L (ref 0–44)
AST: 24 U/L (ref 15–41)
Albumin: 4.1 g/dL (ref 3.5–5.0)
Alkaline Phosphatase: 42 U/L (ref 38–126)
Anion gap: 16 — ABNORMAL HIGH (ref 5–15)
BUN: 44 mg/dL — ABNORMAL HIGH (ref 8–23)
CO2: 19 mmol/L — ABNORMAL LOW (ref 22–32)
Calcium: 10 mg/dL (ref 8.9–10.3)
Chloride: 103 mmol/L (ref 98–111)
Creatinine, Ser: 2.8 mg/dL — ABNORMAL HIGH (ref 0.44–1.00)
GFR, Estimated: 17 mL/min — ABNORMAL LOW (ref >60)
Glucose, Bld: 110 mg/dL — ABNORMAL HIGH (ref 70–99)
Potassium: 3.3 mmol/L — ABNORMAL LOW (ref 3.5–5.1)
Sodium: 138 mmol/L (ref 135–145)
Total Bilirubin: 0.6 mg/dL (ref 0.3–1.2)
Total Protein: 7.1 g/dL (ref 6.5–8.1)

## 2022-09-28 LAB — BRAIN NATRIURETIC PEPTIDE: B Natriuretic Peptide: 19.6 pg/mL (ref 0.0–100.0)

## 2022-09-28 LAB — RESPIRATORY PANEL BY PCR

## 2022-09-28 LAB — D-DIMER, QUANTITATIVE: D-Dimer, Quant: 0.38 ug/mL-FEU (ref 0.00–0.50)

## 2022-09-28 LAB — TROPONIN I (HIGH SENSITIVITY)
Troponin I (High Sensitivity): 6 ng/L (ref <18)
Troponin I (High Sensitivity): 6 ng/L (ref <18)

## 2022-09-28 LAB — MAGNESIUM: Magnesium: 2.1 mg/dL (ref 1.7–2.4)

## 2022-09-28 LAB — SARS CORONAVIRUS 2 BY RT PCR: SARS Coronavirus 2 by RT PCR: NEGATIVE

## 2022-09-28 LAB — TSH: TSH: 1.876 u[IU]/mL (ref 0.350–4.500)

## 2022-09-28 MED ORDER — HYDROXYZINE HCL 10 MG PO TABS
10.0000 mg | ORAL_TABLET | Freq: Three times a day (TID) | ORAL | Status: DC | PRN
  Administered 2022-09-30 (×2): 10 mg via ORAL
  Filled 2022-09-28 (×2): qty 1

## 2022-09-28 MED ORDER — CLOPIDOGREL BISULFATE 75 MG PO TABS
75.0000 mg | ORAL_TABLET | Freq: Every day | ORAL | Status: DC
  Administered 2022-09-29 – 2022-10-01 (×3): 75 mg via ORAL
  Filled 2022-09-28 (×3): qty 1

## 2022-09-28 MED ORDER — ENOXAPARIN SODIUM 30 MG/0.3ML IJ SOSY
30.0000 mg | PREFILLED_SYRINGE | INTRAMUSCULAR | Status: DC
  Administered 2022-09-28 – 2022-09-29 (×2): 30 mg via SUBCUTANEOUS
  Filled 2022-09-28 (×2): qty 0.3

## 2022-09-28 MED ORDER — EZETIMIBE 10 MG PO TABS
10.0000 mg | ORAL_TABLET | Freq: Every day | ORAL | Status: DC
  Administered 2022-09-29 – 2022-10-01 (×3): 10 mg via ORAL
  Filled 2022-09-28 (×3): qty 1

## 2022-09-28 MED ORDER — POTASSIUM CHLORIDE CRYS ER 20 MEQ PO TBCR
40.0000 meq | EXTENDED_RELEASE_TABLET | Freq: Once | ORAL | Status: AC
  Administered 2022-09-28: 40 meq via ORAL
  Filled 2022-09-28: qty 2

## 2022-09-28 MED ORDER — FLUTICASONE PROPIONATE 50 MCG/ACT NA SUSP: 1.0000 | Freq: Every day | NASAL | Status: DC | PRN

## 2022-09-28 MED ORDER — SODIUM CHLORIDE 0.9 % IV SOLN: INTRAVENOUS | Status: DC

## 2022-09-28 MED ORDER — INSULIN ASPART 100 UNIT/ML IJ SOLN
0.0000 [IU] | Freq: Three times a day (TID) | INTRAMUSCULAR | Status: DC
  Administered 2022-09-30: 1 [IU] via SUBCUTANEOUS

## 2022-09-28 MED ORDER — PANTOPRAZOLE SODIUM 20 MG PO TBEC: 20.0000 mg | DELAYED_RELEASE_TABLET | Freq: Every day | ORAL | Status: DC | PRN

## 2022-09-28 MED ORDER — LACTATED RINGERS IV BOLUS
1000.0000 mL | Freq: Once | INTRAVENOUS | Status: AC
  Administered 2022-09-28: 1000 mL via INTRAVENOUS

## 2022-09-28 MED ORDER — ROSUVASTATIN CALCIUM 20 MG PO TABS
40.0000 mg | ORAL_TABLET | Freq: Every day | ORAL | Status: DC
  Administered 2022-09-28 – 2022-09-30 (×3): 40 mg via ORAL
  Filled 2022-09-28 (×3): qty 2

## 2022-09-28 NOTE — ED Triage Notes (Signed)
Pt BIB [facility] from home. Pt was watching tv and had sudden SOB.  Hx: Pt reports similar episode previously.  EMS VS: 76% room air. 99% on 7L, RR: 48 BP: 92/54, 104/72

## 2022-09-28 NOTE — ED Notes (Signed)
ED TO INPATIENT HANDOFF REPORT  ED Nurse Name and Phone #: Sherron Ales and Aggie Cosier [phone]  S Name/Age/Gender Heather Gross 77 y.o. female Room/Bed: 019C/019C  Code Status   Code Status: Prior  Home/SNF/Other Home Patient oriented to: self, place, time, and situation Is this baseline? Yes   Triage Complete: Triage complete  Chief Complaint AKI (acute kidney injury) (HCC) [N17.9]  Triage Note Pt BIB [facility] from home. Pt was watching tv and had sudden SOB.  Hx: Pt reports similar episode previously.  EMS VS: 76% room air. 99% on 7L, RR: 48 BP: 92/54, 104/72    Allergies Allergies  Allergen Reactions   Lipitor [Atorvastatin] Other (See Comments)    Made mouth very dry and the lips discolored   Shellfish-Derived Products Other (See Comments)    Hands break out   Latex Rash    Level of Care/Admitting Diagnosis ED Disposition     ED Disposition  Admit   Condition  --   Comment  Hospital [facility]: [facility] [[id number]]  Level of Care: Telemetry Cardiac [[id number]]  May place patient in observation at [facility] or [facility] if equivalent level of care is available:: Yes  Covid Evaluation: Asymptomatic - no recent exposure (last 10 days) testing not required  Diagnosis: AKI (acute kidney injury) [facility]) [[id number]]  Admitting Physician: Kathlen Mody [[id number]]  Attending Physician: Kathlen Mody [[id number]]          B Medical/Surgery History Past Medical History:  Diagnosis Date   Glaucoma    Hyperlipidemia    Hypertension    Stroke [facility])    Past Surgical History:  Procedure Laterality Date   BREAST BIOPSY Right 09/29/2015   BREAST EXCISIONAL BIOPSY Right 11/04/2015   papilloma   BREAST LUMPECTOMY WITH RADIOACTIVE SEED LOCALIZATION Right 11/04/2015   Procedure: BREAST LUMPECTOMY WITH RADIOACTIVE SEED LOCALIZATION;  Surgeon: Chevis Pretty III, MD;  Location: [facility];  Service: General;  Laterality: Right;   LIPOMA EXCISION       A IV  Location/Drains/Wounds Patient Lines/Drains/Airways Status     Active Line/Drains/Airways     Name Placement date Placement time Site Days   Peripheral IV 09/28/22 20 G Right Antecubital 09/28/22  1440  Antecubital  less than 1            Intake/Output Last 24 hours No intake or output data in the 24 hours ending 09/28/22 1604  Labs/Imaging Results for orders placed or performed during the hospital encounter of 09/28/22 (from the past 48 hour(s))  Comprehensive metabolic panel     Status: Abnormal   Collection Time: 09/28/22  1:15 PM  Result Value Ref Range   Sodium 138 135 - 145 mmol/L   Potassium 3.3 (L) 3.5 - 5.1 mmol/L   Chloride 103 98 - 111 mmol/L   CO2 19 (L) 22 - 32 mmol/L   Glucose, Bld 110 (H) 70 - 99 mg/dL    Comment: Glucose reference range applies only to samples taken after fasting for at least 8 hours.   BUN 44 (H) 8 - 23 mg/dL   Creatinine, Ser 8.46 (H) 0.44 - 1.00 mg/dL   Calcium 96.2 8.9 - 95.2 mg/dL   Total Protein 7.1 6.5 - 8.1 g/dL   Albumin 4.1 3.5 - 5.0 g/dL   AST 24 15 - 41 U/L   ALT 18 0 - 44 U/L   Alkaline Phosphatase 42 38 - 126 U/L   Total Bilirubin 0.6 0.3 - 1.2 mg/dL   GFR,  Estimated 17 (L) >60 mL/min    Comment: (NOTE) Calculated using the CKD-EPI Creatinine Equation (2021)    Anion gap 16 (H) 5 - 15    Comment: Performed at [facility], [address], [place], [place] [address]  CBC with Differential     Status: Abnormal   Collection Time: 09/28/22  1:15 PM  Result Value Ref Range   WBC 7.1 4.0 - 10.5 K/uL   RBC 3.74 (L) 3.87 - 5.11 MIL/uL   Hemoglobin 11.2 (L) 12.0 - 15.0 g/dL   HCT 28.4 (L) 13.2 - 44.0 %   MCV 93.0 80.0 - 100.0 fL   MCH 29.9 26.0 - 34.0 pg   MCHC 32.2 30.0 - 36.0 g/dL   RDW 10.2 72.5 - 36.6 %   Platelets 176 150 - 400 K/uL   nRBC 0.0 0.0 - 0.2 %   Neutrophils Relative % 61 %   Neutro Abs 4.4 1.7 - 7.7 K/uL   Lymphocytes Relative 29 %   Lymphs Abs 2.1 0.7 - 4.0 K/uL   Monocytes Relative 8 %    Monocytes Absolute 0.6 0.1 - 1.0 K/uL   Eosinophils Relative 1 %   Eosinophils Absolute 0.1 0.0 - 0.5 K/uL   Basophils Relative 1 %   Basophils Absolute 0.0 0.0 - 0.1 K/uL   Immature Granulocytes 0 %   Abs Immature Granulocytes 0.02 0.00 - 0.07 K/uL    Comment: Performed at [facility], [address], [place], [place] [address]  D-dimer, quantitative     Status: None   Collection Time: 09/28/22  1:15 PM  Result Value Ref Range   D-Dimer, Quant 0.38 0.00 - 0.50 ug/mL-FEU    Comment: (NOTE) At the manufacturer cut-off value of 0.5 g/mL FEU, this assay has a negative predictive value of 95-100%.This assay is intended for use in conjunction with a clinical pretest probability (PTP) assessment model to exclude pulmonary embolism (PE) and deep venous thrombosis (DVT) in outpatients suspected of PE or DVT. Results should be correlated with clinical presentation. Performed at [facility], [address] [place], [place] [address]   Troponin I (High Sensitivity)     Status: None   Collection Time: 09/28/22  1:15 PM  Result Value Ref Range   Troponin I (High Sensitivity) 6 <18 ng/L    Comment: (NOTE) Elevated high sensitivity troponin I (hsTnI) values and significant  changes across serial measurements may suggest ACS but many other  chronic and acute conditions are known to elevate hsTnI results.  Refer to the "Links" section for chest pain algorithms and additional  guidance. Performed at [facility], [address], [place], [place] [address]   Brain natriuretic peptide     Status: None   Collection Time: 09/28/22  1:15 PM  Result Value Ref Range   B Natriuretic Peptide 19.6 0.0 - 100.0 pg/mL    Comment: Performed at [facility], [address] [place], [place] [address]   DG Chest Port 1 View  Result Date: 09/28/2022 CLINICAL DATA:  Shortness of breath EXAM: PORTABLE CHEST 1 VIEW COMPARISON:  01/10/2022 FINDINGS: The heart size and mediastinal  contours are within normal limits. Both lungs are clear. No consolidation, pneumothorax, effusion or edema. The visualized skeletal structures are unremarkable. Overlapping cardiac leads. Presumed loop recorder along the lower left hemithorax, new from previous. IMPRESSION: No acute cardiopulmonary disease. Electronically Signed   By: Karen Kays M.D.   On: 09/28/2022 14:01  Pending Labs Unresulted Labs (From admission, onward)    None       Vitals/Pain Today's Vitals   09/28/22 1254 09/28/22 1255 09/28/22 1345 09/28/22 1445  BP:   (!) 101/53 (!) 95/51  Pulse:   73 69  Resp:   20 16  SpO2:   100% 100%  Height:  5\' 5"  (1.651 m)    PainSc: 0-No pain       Isolation Precautions No active isolations  Medications Medications  lactated ringers bolus 1,000 mL (1,000 mLs Intravenous New Bag/Given 09/28/22 1446)  potassium chloride SA (KLOR-CON M) CR tablet 40 mEq (40 mEq Oral Given 09/28/22 1534)    Mobility walks     Focused Assessments Pulmonary Assessment Handoff:  Lung sounds:          R Recommendations: See Admitting Provider Note  Report given to:   Additional Notes: Pt A&Ox4, 1L O2.

## 2022-09-28 NOTE — ED Provider Notes (Signed)
[place] EMERGENCY DEPARTMENT AT [facility] Provider Note   CSN: [id number] Arrival date & time: 09/28/22  1244     History  Chief Complaint  Patient presents with   Shortness of Breath    Heather Gross is a 77 y.o. female.  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  Patient states that she was watching TV when she had sudden onset severe shortness of breath.  EMS was called and on their arrival she was found to be hypoxic to the 70s on room air and was placed on 7 L nasal cannula.  She also had mildly low blood pressures and wrote.  Patient denies any associated chest pain.  She denies any recent fever or cough.  She denies any recent nausea, vomiting or diarrhea.  She states that she had similar symptoms once before in the past but is unsure of the cause.  She denies any history of blood clots, recent hospitalization or surgery, recent long travel in the car or plane, hormone use or cancer history.  The history is provided by the patient and the spouse.  Shortness of Breath      Home Medications Prior to Admission medications   Medication Sig Start Date End Date Taking? Authorizing Provider  Calcium 500 MG CHEW Chew 2 tablets (1,000 mg total) by mouth daily. 12/13/16   Dorena Bodo, PA-C  clopidogrel (PLAVIX) 75 MG tablet Take 1 tablet (75 mg total) by mouth daily. Start on December 11, 2021 12/11/21   Joycelyn Das, MD  [employer] D3 2000 units CAPS TAKE 1 CAPSULE BY MOUTH EVERY DAY Patient taking differently: Take 2,000 Units by mouth daily after breakfast. 12/17/17   Dorena Bodo, PA-C  ezetimibe (ZETIA) 10 MG tablet Take 10 mg by mouth daily. 09/14/21   [provider]  fluticasone (FLONASE) 50 MCG/ACT nasal spray Place 1-2 sprays into both nostrils daily as needed for allergies or rhinitis. 06/06/19   [provider]   metFORMIN (GLUCOPHAGE) 500 MG tablet TAKE 1 TABLET BY MOUTH AT BEDTIME. Patient taking differently: Take 500 mg by mouth daily with breakfast. 08/16/20   Salley Scarlet, MD  Multiple Vitamin (MULTIVITAMIN) tablet Take 1 tablet by mouth daily with breakfast.    [provider]  Olmesartan-amLODIPine-HCTZ 40-5-25 MG TABS Take 1 tablet by mouth daily. 09/28/21   Chilton Si, MD  pantoprazole (PROTONIX) 20 MG tablet Take 1 tablet (20 mg total) by mouth daily. Patient taking differently: Take 20 mg by mouth daily as needed for indigestion. 09/14/21 09/14/22  Danford, Earl Lites, MD  rosuvastatin (CRESTOR) 40 MG tablet Take 40 mg by mouth at bedtime. 09/08/21   [provider]      Allergies    Lipitor [atorvastatin], Shellfish-derived products, and Latex    Review of Systems   Review of Systems  Respiratory:  Positive for shortness of breath.     Physical Exam Updated Vital Signs BP (!) 95/51   Pulse 69   Resp 16   Ht 5\' 5"  (1.651 m)   SpO2 100%   BMI 31.62 kg/m  Physical Exam Vitals and nursing note reviewed.  Constitutional:      General: She is not in acute distress.    Appearance: She is well-developed.  HENT:     Head: Normocephalic and atraumatic.     Mouth/Throat:     Mouth: Mucous membranes  are moist.  Eyes:     Extraocular Movements: Extraocular movements intact.  Neck:     Vascular: No JVD.  Cardiovascular:     Rate and Rhythm: Normal rate and regular rhythm.     Pulses: Normal pulses.     Heart sounds: Normal heart sounds.  Pulmonary:     Breath sounds: Normal breath sounds.     Comments: Mildly tachypneic Abdominal:     Palpations: Abdomen is soft.     Tenderness: There is no abdominal tenderness.  Musculoskeletal:        General: Normal range of motion.     Cervical back: Normal range of motion and neck supple.     Right lower leg: No edema.     Left lower leg: No edema.  Skin:    General: Skin is warm and dry.  Neurological:      General: No focal deficit present.     Mental Status: She is alert and oriented to person, place, and time.  Psychiatric:        Mood and Affect: Mood normal.        Behavior: Behavior normal.     ED Results / Procedures / Treatments   Labs (all labs ordered are listed, but only abnormal results are displayed) Labs Reviewed  COMPREHENSIVE METABOLIC PANEL - Abnormal; Notable for the following components:      Result Value   Potassium 3.3 (*)    CO2 19 (*)    Glucose, Bld 110 (*)    BUN 44 (*)    Creatinine, Ser 2.80 (*)    GFR, Estimated 17 (*)    Anion gap 16 (*)    All other components within normal limits  CBC WITH DIFFERENTIAL/PLATELET - Abnormal; Notable for the following components:   RBC 3.74 (*)    Hemoglobin 11.2 (*)    HCT 34.8 (*)    All other components within normal limits  D-DIMER, QUANTITATIVE  BRAIN NATRIURETIC PEPTIDE  TROPONIN I (HIGH SENSITIVITY)  TROPONIN I (HIGH SENSITIVITY)    EKG EKG Interpretation  Date/Time:  Thursday Sep 28 2022 13:31:47 EDT Ventricular Rate:  66 PR Interval:  186 QRS Duration: 94 QT Interval:  401 QTC Calculation: 421 R Axis:   28 Text Interpretation: Sinus rhythm Ventricular premature complex Abnormal R-wave progression, early transition Borderline repolarization abnormality No significant change since last tracing Confirmed by Elayne Snare ([id number]) on 09/28/2022 2:09:10 PM  Radiology DG Chest Port 1 View  Result Date: 09/28/2022 CLINICAL DATA:  Shortness of breath EXAM: PORTABLE CHEST 1 VIEW COMPARISON:  01/10/2022 FINDINGS: The heart size and mediastinal contours are within normal limits. Both lungs are clear. No consolidation, pneumothorax, effusion or edema. The visualized skeletal structures are unremarkable. Overlapping cardiac leads. Presumed loop recorder along the lower left hemithorax, new from previous. IMPRESSION: No acute cardiopulmonary disease. Electronically Signed   By: Karen Kays M.D.   On:  09/28/2022 14:01    Procedures Procedures    Medications Ordered in ED Medications  lactated ringers bolus 1,000 mL (1,000 mLs Intravenous New Bag/Given 09/28/22 1446)  potassium chloride SA (KLOR-CON M) CR tablet 40 mEq (40 mEq Oral Given 09/28/22 1534)    ED Course/ Medical Decision Making/ A&P Clinical Course as of 09/28/22 1607  Thu Sep 28, 2022  1438 Patient uncomfortable on NRB satting 100% and transitioning to 3L [place]. Lungs CTAB. Complaining of cold feet. Pulses intact. BP low in 70s and Rpt in 90s. She was started on 1L IVF. [  VK]    Clinical Course User Index [VK] Rexford Maus, DO                             Medical Decision Making This patient presents to the ED with chief complaint(s) of shortness of breath with pertinent past medical history of CKD, CVA which further complicates the presenting complaint. The complaint involves an extensive differential diagnosis and also carries with it a high risk of complications and morbidity.    The differential diagnosis includes ACS, arrhythmia, anemia, pneumonia, pulmonary edema, pleural effusion, pneumothorax, considering PE with her hypoxia though she is low risk by Wells criteria  Additional history obtained: Additional history obtained from spouse Records reviewed outpatient neurology  ED Course and Reassessment: On patient's arrival she was satting well on nonrebreather and will attempt to be titrated down.  Thinks patient mildly soft blood pressures on arrival in the 100s and will be closely monitored.  Patient will have labs including troponin, EKG and chest x-ray performed to further evaluate for cause of her shortness of breath and will be closely reassessed.  Independent labs interpretation:  The following labs were independently interpreted: AKI on CKD otherwise within normal range  Independent visualization of imaging: - I independently visualized the following imaging with scope of interpretation limited to  determining acute life threatening conditions related to emergency care: Chest x-ray, which revealed no acute disease  Consultation: - Consulted or discussed management/test interpretation w/ external professional: Hospitalists  Consideration for admission or further workup: Patient requires admission for her AKI Social Determinants of health: N/A    Amount and/or Complexity of Data Reviewed Labs: ordered. Radiology: ordered.          Final Clinical Impression(s) / ED Diagnoses Final diagnoses:  AKI (acute kidney injury) (HCC)  Hypoxia    Rx / DC Orders ED Discharge Orders     None         Rexford Maus, DO 09/28/22 1607

## 2022-09-28 NOTE — H&P (Addendum)
History and Physical    Patient: Heather Gross ZOX:[id number] DOB: October 12, 1945 DOA: 09/28/2022 DOS: the patient was seen and examined on 09/28/2022 PCP: Loura Back, NP  Patient coming from: Home  Chief Complaint:  Chief Complaint  Patient presents with   Shortness of Breath   HPI: Heather Gross is a 77 y.o. female with medical history significant of hypertension, CVA with no residual deficits, hyperlipidemia, reports having sob intermittently over the last few weeks, worsened today to the point that she couldn't breath. She denies chest pain, cough, fever, chills, nausea, vomiting, diarrhea , abdominal pain, headache, dizziness and blurry vision, dysuria, syncope. EMS called and she was found to be hypoxic requiring 7 lit of [place] oxygen. She was also found to be borderline hypotensive on arrival to ED. On my exam patient was weaned down to 1 lit of [place] oxygen with sats around 100%.  ED work up include CXR negative for acute findings.  Labs significant for hemoglobin of 11.2, BNP of 19 and negative troponins.  BMP show potassium of 3.3, BUn of 44, creatinine of 2.8, bicarb os 19, . D dimer is 0.38, TSH wnl  EKG shows non specific t wave inversions. Sinus rhythm.   She was referred to [facility] for admission for AKI.    Review of Systems: As mentioned in the history of present illness. All other systems reviewed and are negative. Past Medical History:  Diagnosis Date   Glaucoma    Hyperlipidemia    Hypertension    Stroke [facility])    Past Surgical History:  Procedure Laterality Date   BREAST BIOPSY Right 09/29/2015   BREAST EXCISIONAL BIOPSY Right 11/04/2015   papilloma   BREAST LUMPECTOMY WITH RADIOACTIVE SEED LOCALIZATION Right 11/04/2015   Procedure: BREAST LUMPECTOMY WITH RADIOACTIVE SEED LOCALIZATION;  Surgeon: Chevis Pretty III, MD;  Location: [facility];  Service: General;  Laterality: Right;   LIPOMA EXCISION     Social History:  reports that she quit smoking about 32 years  ago. Her smoking use included cigarettes. She has never used smokeless tobacco. She reports that she does not drink alcohol and does not use drugs.  Allergies  Allergen Reactions   Lipitor [Atorvastatin] Other (See Comments)    Made mouth very dry and the lips discolored   Shellfish-Derived Products Other (See Comments)    Hands break out   Latex Rash    Family History  Problem Relation Age of Onset   Heart disease Father    Diabetes Sister    Breast cancer Sister    Diabetes Sister    Diabetes Sister    Diabetes Sister    Diabetes Sister    Diabetes Brother     Prior to Admission medications   Medication Sig Start Date End Date Taking? Authorizing Provider  Calcium 500 MG CHEW Chew 2 tablets (1,000 mg total) by mouth daily. 12/13/16   Dorena Bodo, PA-C  clopidogrel (PLAVIX) 75 MG tablet Take 1 tablet (75 mg total) by mouth daily. Start on December 11, 2021 12/11/21   Joycelyn Das, MD  [employer] D3 2000 units CAPS TAKE 1 CAPSULE BY MOUTH EVERY DAY Patient taking differently: Take 2,000 Units by mouth daily after breakfast. 12/17/17   Dorena Bodo, PA-C  ezetimibe (ZETIA) 10 MG tablet Take 10 mg by mouth daily. 09/14/21   [provider]  fluticasone (FLONASE) 50 MCG/ACT nasal spray Place 1-2 sprays into both nostrils daily as needed for allergies or rhinitis. 06/06/19   [provider]  metFORMIN (GLUCOPHAGE) 500 MG tablet TAKE 1 TABLET BY MOUTH AT BEDTIME. Patient taking differently: Take 500 mg by mouth daily with breakfast. 08/16/20   Salley Scarlet, MD  Multiple Vitamin (MULTIVITAMIN) tablet Take 1 tablet by mouth daily with breakfast.    [provider]  Olmesartan-amLODIPine-HCTZ 40-5-25 MG TABS Take 1 tablet by mouth daily. 09/28/21   Chilton Si, MD  pantoprazole (PROTONIX) 20 MG tablet Take 1 tablet (20 mg total) by mouth daily. Patient taking differently: Take 20 mg by mouth daily as needed for indigestion. 09/14/21 09/14/22  Danford, Earl Lites, MD  rosuvastatin (CRESTOR) 40 MG tablet Take 40 mg by mouth at bedtime. 09/08/21   [provider]    Physical Exam: Vitals:   09/28/22 1345 09/28/22 1445 09/28/22 1626 09/28/22 1643  BP: (!) 101/53 (!) 95/51  129/72  Pulse: 73 69  (!) 108  Resp: 20 16  (!) 38  Temp:   97.7 F (36.5 C)   TempSrc:   Oral   SpO2: 100% 100%  100%  Weight:    85.6 kg  Height:    5\' 5"  (1.651 m)   General exam: Appears calm and comfortable  Respiratory system: Clear to auscultation. Respiratory effort normal. Cardiovascular system: S1 & S2 heard, RRR. No JVD,  Gastrointestinal system: Abdomen is nondistended, soft and nontender.  Central nervous system: Alert and oriented. No focal neurological deficits. Extremities: Symmetric 5 x 5 power. Skin: No rashes, lesions or ulcers Psychiatry: Mood & affect appropriate.   Data Reviewed: Results for orders placed or performed during the hospital encounter of 09/28/22 (from the past 24 hour(s))  Comprehensive metabolic panel     Status: Abnormal   Collection Time: 09/28/22  1:15 PM  Result Value Ref Range   Sodium 138 135 - 145 mmol/L   Potassium 3.3 (L) 3.5 - 5.1 mmol/L   Chloride 103 98 - 111 mmol/L   CO2 19 (L) 22 - 32 mmol/L   Glucose, Bld 110 (H) 70 - 99 mg/dL   BUN 44 (H) 8 - 23 mg/dL   Creatinine, Ser 1.61 (H) 0.44 - 1.00 mg/dL   Calcium 09.6 8.9 - 04.5 mg/dL   Total Protein 7.1 6.5 - 8.1 g/dL   Albumin 4.1 3.5 - 5.0 g/dL   AST 24 15 - 41 U/L   ALT 18 0 - 44 U/L   Alkaline Phosphatase 42 38 - 126 U/L   Total Bilirubin 0.6 0.3 - 1.2 mg/dL   GFR, Estimated 17 (L) >60 mL/min   Anion gap 16 (H) 5 - 15  CBC with Differential     Status: Abnormal   Collection Time: 09/28/22  1:15 PM  Result Value Ref Range   WBC 7.1 4.0 - 10.5 K/uL   RBC 3.74 (L) 3.87 - 5.11 MIL/uL   Hemoglobin 11.2 (L) 12.0 - 15.0 g/dL   HCT 40.9 (L) 81.1 - 91.4 %   MCV 93.0 80.0 - 100.0 fL   MCH 29.9 26.0 - 34.0 pg   MCHC 32.2 30.0 - 36.0 g/dL   RDW 78.2 95.6 -  21.3 %   Platelets 176 150 - 400 K/uL   nRBC 0.0 0.0 - 0.2 %   Neutrophils Relative % 61 %   Neutro Abs 4.4 1.7 - 7.7 K/uL   Lymphocytes Relative 29 %   Lymphs Abs 2.1 0.7 - 4.0 K/uL   Monocytes Relative 8 %   Monocytes Absolute 0.6 0.1 - 1.0 K/uL   Eosinophils  Relative 1 %   Eosinophils Absolute 0.1 0.0 - 0.5 K/uL   Basophils Relative 1 %   Basophils Absolute 0.0 0.0 - 0.1 K/uL   Immature Granulocytes 0 %   Abs Immature Granulocytes 0.02 0.00 - 0.07 K/uL  D-dimer, quantitative     Status: None   Collection Time: 09/28/22  1:15 PM  Result Value Ref Range   D-Dimer, Quant 0.38 0.00 - 0.50 ug/mL-FEU  Troponin I (High Sensitivity)     Status: None   Collection Time: 09/28/22  1:15 PM  Result Value Ref Range   Troponin I (High Sensitivity) 6 <18 ng/L  Brain natriuretic peptide     Status: None   Collection Time: 09/28/22  1:15 PM  Result Value Ref Range   B Natriuretic Peptide 19.6 0.0 - 100.0 pg/mL  Troponin I (High Sensitivity)     Status: None   Collection Time: 09/28/22  3:21 PM  Result Value Ref Range   Troponin I (High Sensitivity) 6 <18 ng/L  TSH     Status: None   Collection Time: 09/28/22  4:49 PM  Result Value Ref Range   TSH 1.876 0.350 - 4.500 uIU/mL  Magnesium     Status: None   Collection Time: 09/28/22  4:49 PM  Result Value Ref Range   Magnesium 2.1 1.7 - 2.4 mg/dL  SARS Coronavirus 2 by RT PCR (hospital order, performed in [facility] hospital lab) *cepheid single result test* Anterior Nasal Swab     Status: None   Collection Time: 09/28/22  4:55 PM   Specimen: Anterior Nasal Swab  Result Value Ref Range   SARS Coronavirus 2 by RT PCR NEGATIVE NEGATIVE  Respiratory (~20 pathogens) panel by PCR     Status: None   Collection Time: 09/28/22  4:56 PM   Specimen: Nasopharyngeal Swab; Respiratory  Result Value Ref Range   Adenovirus NOT DETECTED NOT DETECTED   Coronavirus 229E NOT DETECTED NOT DETECTED   Coronavirus HKU1 NOT DETECTED NOT DETECTED    Coronavirus NL63 NOT DETECTED NOT DETECTED   Coronavirus OC43 NOT DETECTED NOT DETECTED   Metapneumovirus NOT DETECTED NOT DETECTED   Rhinovirus / Enterovirus NOT DETECTED NOT DETECTED   Influenza A NOT DETECTED NOT DETECTED   Influenza B NOT DETECTED NOT DETECTED   Parainfluenza Virus 1 NOT DETECTED NOT DETECTED   Parainfluenza Virus 2 NOT DETECTED NOT DETECTED   Parainfluenza Virus 3 NOT DETECTED NOT DETECTED   Parainfluenza Virus 4 NOT DETECTED NOT DETECTED   Respiratory Syncytial Virus NOT DETECTED NOT DETECTED   Bordetella pertussis NOT DETECTED NOT DETECTED   Bordetella Parapertussis NOT DETECTED NOT DETECTED   Chlamydophila pneumoniae NOT DETECTED NOT DETECTED   Mycoplasma pneumoniae NOT DETECTED NOT DETECTED    Assessment and Plan:  Acute respiratory failure with hypoxia: Unclear etiology.  Differential include metabolic acidosis  sec to AKI vs ACS vs PANIC attacks vs arrhythmias  Monitor on telemetry  Troponin 2  negative, BNP wnl.  EKG does not show any Ischemic changes.  CXR does not show any acute findings.  D dimer is negative. Very low probability for PE.  Initially required about 7 lit of [place] oxygen, currently on 1 lit with sats in 100%.  COVID 19 pcr ordered, respiratory panel ordered.     SIRS:  Hypotensive with BP 90/50's, tachycardic 108/min, tachypnea of 38/min, and hypoxic.  Will get Blood cultures.  No pneumonia on CXR.  Get UA and urine cultures.   Acute kidney injury on  stage 3 a CKD with  anion gap metabolic acidosis  AG is 16, bicarb is 19, baseline creatinine around 1.5 and she was admitted with a creatinine of 2.80. Suspect from dehydration and from taking hydrochlorothiazide.  Hypotension on admission with BP around 90/50 mmhg.  Hold ARB and diuretics.  Gently hydrate and repeat renal parameters in am.    Hyperlipidemia:  Resume Crestor and Zetia.     Type 2 DM non insulin dependent  Get A1c  Hold metformin due to acidosis.    Hypokalemia replaced.  Magnesium is wnl.    Mild normocytic anemia Hemoglobin around 11 and stable.    H/o CVA without any residual deficits Continue with plavix.    GERD: On ppi.    Anxiety: added atarax.    Advance Care Planning:   Code Status: Full Code   Consults: none.   Family Communication: family at bedside.   Severity of Illness: The appropriate patient status for this patient is OBSERVATION. Observation status is judged to be reasonable and necessary in order to provide the required intensity of service to ensure the patient's safety. The patient's presenting symptoms, physical exam findings, and initial radiographic and laboratory data in the context of their medical condition is felt to place them at decreased risk for further clinical deterioration. Furthermore, it is anticipated that the patient will be medically stable for discharge from the hospital within 2 midnights of admission.   Author: Kathlen Mody, MD 09/28/2022 7:45 PM  For on call review [email or web address].

## 2022-09-28 NOTE — ED Notes (Signed)
Gave patient some warm blankets patient is resting with call bell in reach 

## 2022-09-29 ENCOUNTER — Observation Stay (HOSPITAL_COMMUNITY): Payer: Medicare HMO

## 2022-09-29 ENCOUNTER — Observation Stay (HOSPITAL_BASED_OUTPATIENT_CLINIC_OR_DEPARTMENT_OTHER): Payer: Medicare HMO

## 2022-09-29 ENCOUNTER — Encounter (HOSPITAL_COMMUNITY): Payer: Self-pay | Admitting: Internal Medicine

## 2022-09-29 DIAGNOSIS — I129 Hypertensive chronic kidney disease with stage 1 through stage 4 chronic kidney disease, or unspecified chronic kidney disease: Secondary | ICD-10-CM | POA: Diagnosis present

## 2022-09-29 DIAGNOSIS — E785 Hyperlipidemia, unspecified: Secondary | ICD-10-CM | POA: Diagnosis present

## 2022-09-29 DIAGNOSIS — F410 Panic disorder [episodic paroxysmal anxiety] without agoraphobia: Secondary | ICD-10-CM | POA: Diagnosis present

## 2022-09-29 DIAGNOSIS — R0602 Shortness of breath: Secondary | ICD-10-CM | POA: Diagnosis present

## 2022-09-29 DIAGNOSIS — N1831 Chronic kidney disease, stage 3a: Secondary | ICD-10-CM | POA: Diagnosis present

## 2022-09-29 DIAGNOSIS — I471 Supraventricular tachycardia, unspecified: Secondary | ICD-10-CM | POA: Diagnosis present

## 2022-09-29 DIAGNOSIS — Z6831 Body mass index (BMI) 31.0-31.9, adult: Secondary | ICD-10-CM | POA: Diagnosis not present

## 2022-09-29 DIAGNOSIS — Z5982 Transportation insecurity: Secondary | ICD-10-CM | POA: Diagnosis not present

## 2022-09-29 DIAGNOSIS — R0609 Other forms of dyspnea: Secondary | ICD-10-CM | POA: Diagnosis not present

## 2022-09-29 DIAGNOSIS — E1122 Type 2 diabetes mellitus with diabetic chronic kidney disease: Secondary | ICD-10-CM

## 2022-09-29 DIAGNOSIS — N390 Urinary tract infection, site not specified: Secondary | ICD-10-CM | POA: Diagnosis present

## 2022-09-29 DIAGNOSIS — Z8249 Family history of ischemic heart disease and other diseases of the circulatory system: Secondary | ICD-10-CM | POA: Diagnosis not present

## 2022-09-29 DIAGNOSIS — B952 Enterococcus as the cause of diseases classified elsewhere: Secondary | ICD-10-CM | POA: Diagnosis present

## 2022-09-29 DIAGNOSIS — I10 Essential (primary) hypertension: Secondary | ICD-10-CM

## 2022-09-29 DIAGNOSIS — D696 Thrombocytopenia, unspecified: Secondary | ICD-10-CM | POA: Diagnosis present

## 2022-09-29 DIAGNOSIS — E669 Obesity, unspecified: Secondary | ICD-10-CM | POA: Diagnosis present

## 2022-09-29 DIAGNOSIS — E872 Acidosis, unspecified: Secondary | ICD-10-CM | POA: Diagnosis present

## 2022-09-29 DIAGNOSIS — I480 Paroxysmal atrial fibrillation: Secondary | ICD-10-CM | POA: Diagnosis present

## 2022-09-29 DIAGNOSIS — Z8673 Personal history of transient ischemic attack (TIA), and cerebral infarction without residual deficits: Secondary | ICD-10-CM | POA: Diagnosis not present

## 2022-09-29 DIAGNOSIS — N179 Acute kidney failure, unspecified: Secondary | ICD-10-CM | POA: Diagnosis present

## 2022-09-29 DIAGNOSIS — D631 Anemia in chronic kidney disease: Secondary | ICD-10-CM | POA: Diagnosis present

## 2022-09-29 DIAGNOSIS — N182 Chronic kidney disease, stage 2 (mild): Secondary | ICD-10-CM

## 2022-09-29 DIAGNOSIS — K219 Gastro-esophageal reflux disease without esophagitis: Secondary | ICD-10-CM | POA: Diagnosis present

## 2022-09-29 DIAGNOSIS — E876 Hypokalemia: Secondary | ICD-10-CM | POA: Diagnosis present

## 2022-09-29 DIAGNOSIS — J9601 Acute respiratory failure with hypoxia: Secondary | ICD-10-CM | POA: Diagnosis present

## 2022-09-29 DIAGNOSIS — B9620 Unspecified Escherichia coli [E. coli] as the cause of diseases classified elsewhere: Secondary | ICD-10-CM | POA: Diagnosis present

## 2022-09-29 DIAGNOSIS — Z1152 Encounter for screening for COVID-19: Secondary | ICD-10-CM | POA: Diagnosis not present

## 2022-09-29 DIAGNOSIS — Z87891 Personal history of nicotine dependence: Secondary | ICD-10-CM | POA: Diagnosis not present

## 2022-09-29 LAB — ECHOCARDIOGRAM COMPLETE
AR max vel: 2.13 cm2
AV Area VTI: 2.36 cm2
AV Area mean vel: 2.25 cm2
AV Mean grad: 6 mmHg
AV Peak grad: 9.2 mmHg
Ao pk vel: 1.52 m/s
Area-P 1/2: 2.16 cm2
Height: 65 in
S' Lateral: 2.2 cm
Weight: 3019.42 oz

## 2022-09-29 LAB — URINALYSIS, ROUTINE W REFLEX MICROSCOPIC
Bilirubin Urine: NEGATIVE
Glucose, UA: NEGATIVE mg/dL
Ketones, ur: NEGATIVE mg/dL
Nitrite: NEGATIVE
Protein, ur: 30 mg/dL — AB
Specific Gravity, Urine: 1.01 (ref 1.005–1.030)
WBC, UA: >50 WBC/hpf (ref 0–5)
pH: 5 (ref 5.0–8.0)

## 2022-09-29 LAB — CBC WITH DIFFERENTIAL/PLATELET
Abs Immature Granulocytes: 0.03 10*3/uL (ref 0.00–0.07)
Basophils Absolute: 0 10*3/uL (ref 0.0–0.1)
Basophils Relative: 0 %
Eosinophils Absolute: 0.1 10*3/uL (ref 0.0–0.5)
Eosinophils Relative: 1 %
HCT: 28.8 % — ABNORMAL LOW (ref 36.0–46.0)
Hemoglobin: 9.7 g/dL — ABNORMAL LOW (ref 12.0–15.0)
Immature Granulocytes: 0 %
Lymphocytes Relative: 35 %
Lymphs Abs: 2.4 10*3/uL (ref 0.7–4.0)
MCH: 30.2 pg (ref 26.0–34.0)
MCHC: 33.7 g/dL (ref 30.0–36.0)
MCV: 89.7 fL (ref 80.0–100.0)
Monocytes Absolute: 0.5 10*3/uL (ref 0.1–1.0)
Monocytes Relative: 8 %
Neutro Abs: 3.6 10*3/uL (ref 1.7–7.7)
Neutrophils Relative %: 56 %
Platelets: 149 10*3/uL — ABNORMAL LOW (ref 150–400)
RBC: 3.21 MIL/uL — ABNORMAL LOW (ref 3.87–5.11)
RDW: 12.5 % (ref 11.5–15.5)
WBC: 6.7 10*3/uL (ref 4.0–10.5)
nRBC: 0 % (ref 0.0–0.2)

## 2022-09-29 LAB — BASIC METABOLIC PANEL
Anion gap: 11 (ref 5–15)
BUN: 41 mg/dL — ABNORMAL HIGH (ref 8–23)
CO2: 21 mmol/L — ABNORMAL LOW (ref 22–32)
Calcium: 9.1 mg/dL (ref 8.9–10.3)
Chloride: 105 mmol/L (ref 98–111)
Creatinine, Ser: 2.59 mg/dL — ABNORMAL HIGH (ref 0.44–1.00)
GFR, Estimated: 19 mL/min — ABNORMAL LOW (ref >60)
Glucose, Bld: 103 mg/dL — ABNORMAL HIGH (ref 70–99)
Potassium: 3 mmol/L — ABNORMAL LOW (ref 3.5–5.1)
Sodium: 137 mmol/L (ref 135–145)

## 2022-09-29 LAB — IRON AND TIBC
Iron: 82 ug/dL (ref 28–170)
Saturation Ratios: 30 % (ref 10.4–31.8)
TIBC: 276 ug/dL (ref 250–450)
UIBC: 194 ug/dL

## 2022-09-29 LAB — FOLATE: Folate: 18 ng/mL (ref >5.9)

## 2022-09-29 LAB — HEMOGLOBIN A1C
Hgb A1c MFr Bld: 7.3 % — ABNORMAL HIGH (ref 4.8–5.6)
Mean Plasma Glucose: 163 mg/dL

## 2022-09-29 LAB — URINALYSIS, W/ REFLEX TO CULTURE (INFECTION SUSPECTED)
Bacteria, UA: NONE SEEN
Bilirubin Urine: NEGATIVE
Glucose, UA: NEGATIVE mg/dL
Ketones, ur: NEGATIVE mg/dL
Nitrite: NEGATIVE
Protein, ur: 30 mg/dL — AB
Specific Gravity, Urine: 1.008 (ref 1.005–1.030)
WBC, UA: >50 WBC/hpf (ref 0–5)
pH: 6 (ref 5.0–8.0)

## 2022-09-29 LAB — FERRITIN: Ferritin: 309 ng/mL — ABNORMAL HIGH (ref 11–307)

## 2022-09-29 LAB — CULTURE, BLOOD (ROUTINE X 2)

## 2022-09-29 LAB — RETICULOCYTES
Immature Retic Fract: 6.8 % (ref 2.3–15.9)
RBC.: 3.34 MIL/uL — ABNORMAL LOW (ref 3.87–5.11)
Retic Count, Absolute: 40.7 10*3/uL (ref 19.0–186.0)
Retic Ct Pct: 1.2 % (ref 0.4–3.1)

## 2022-09-29 LAB — SODIUM, URINE, RANDOM: Sodium, Ur: 88 mmol/L

## 2022-09-29 LAB — GLUCOSE, CAPILLARY
Glucose-Capillary: 100 mg/dL — ABNORMAL HIGH (ref 70–99)
Glucose-Capillary: 101 mg/dL — ABNORMAL HIGH (ref 70–99)
Glucose-Capillary: 103 mg/dL — ABNORMAL HIGH (ref 70–99)

## 2022-09-29 LAB — VITAMIN B12: Vitamin B-12: 303 pg/mL (ref 180–914)

## 2022-09-29 LAB — MAGNESIUM: Magnesium: 2 mg/dL (ref 1.7–2.4)

## 2022-09-29 MED ORDER — SODIUM CHLORIDE 0.9 % IV SOLN
1.0000 g | INTRAVENOUS | Status: DC
  Administered 2022-09-30 – 2022-10-01 (×2): 1 g via INTRAVENOUS
  Filled 2022-09-29 (×2): qty 10

## 2022-09-29 MED ORDER — DILTIAZEM HCL 25 MG/5ML IV SOLN
10.0000 mg | Freq: Once | INTRAVENOUS | Status: AC
  Administered 2022-09-30: 10 mg via INTRAVENOUS
  Filled 2022-09-29: qty 5

## 2022-09-29 MED ORDER — PERFLUTREN LIPID MICROSPHERE
1.0000 mL | INTRAVENOUS | Status: AC | PRN
  Administered 2022-09-29: 2 mL via INTRAVENOUS

## 2022-09-29 MED ORDER — POTASSIUM CHLORIDE CRYS ER 20 MEQ PO TBCR
40.0000 meq | EXTENDED_RELEASE_TABLET | Freq: Two times a day (BID) | ORAL | Status: AC
  Administered 2022-09-29 (×2): 40 meq via ORAL
  Filled 2022-09-29 (×2): qty 2

## 2022-09-29 MED ORDER — POTASSIUM CHLORIDE CRYS ER 20 MEQ PO TBCR: 40.0000 meq | EXTENDED_RELEASE_TABLET | Freq: Two times a day (BID) | ORAL | Status: DC

## 2022-09-29 NOTE — Evaluation (Signed)
Physical Therapy Evaluation Patient Details Name: Chenee Corella MRN: [id number] DOB: [birth year] Today's Date: 09/29/2022  History of Present Illness  Pt is a 77 year old female admitted on 09/28/22 for SOB. Past medical history significant of hypertension, CVA with no residual deficits, hyperlipidemia.  Clinical Impression  Pt presents with admitting diagnosis above. Pt was ambulate in hallway today with no AD and navigate stairs at supervision/Min G level. Pt presents at or near baseline mobility. Pt has no further acute PT needs and will be signing off. Re consult PT if mobility status changes. PT would benefit from continued mobility with mobility specialist during acute stay.      Recommendations for follow up therapy are one component of a multi-disciplinary discharge planning process, led by the attending physician.  Recommendations may be updated based on patient status, additional functional criteria and insurance authorization.  Follow Up Recommendations       Assistance Recommended at Discharge PRN  Patient can return home with the following  Help with stairs or ramp for entrance;Assist for transportation;A little help with walking and/or transfers;A little help with bathing/dressing/bathroom;Assistance with cooking/housework    Equipment Recommendations None recommended by PT  Recommendations for Other Services       Functional Status Assessment Patient has had a recent decline in their functional status and demonstrates the ability to make significant improvements in function in a reasonable and predictable amount of time.     Precautions / Restrictions Precautions Precautions: Fall Restrictions Weight Bearing Restrictions: No      Mobility  Bed Mobility Overal bed mobility: Modified Independent             General bed mobility comments: HOB elevated    Transfers Overall transfer level: Independent Equipment used: None                     Ambulation/Gait Ambulation/Gait assistance: Supervision, Min guard Gait Distance (Feet): 150 Feet Assistive device: None Gait Pattern/deviations: Decreased stride length, Step-through pattern, Trunk flexed Gait velocity: decreased Gait velocity interpretation: <1.31 ft/sec, indicative of household ambulator   General Gait Details: Pt with very slow steady cautious gait. Pt noted with arms out for balance and cued to take bigger steps. no LOB noted.  Stairs Stairs: Yes Stairs assistance: Min guard Stair Management: Two rails, Alternating pattern, Forwards Number of Stairs: 2 General stair comments: no LOB noted  Wheelchair Mobility    Modified Rankin (Stroke Patients Only)       Balance Overall balance assessment: Mild deficits observed, not formally tested                                           Pertinent Vitals/Pain Pain Assessment Pain Assessment: No/denies pain    Home Living Family/patient expects to be discharged to:: Private residence Living Arrangements: Children;Spouse/significant other (Son) Available Help at Discharge: Family;Available 24 hours/day Type of Home: House Home Access: Stairs to enter Entrance Stairs-Rails: None Entrance Stairs-Number of Steps: 2   Home Layout: One level Home Equipment: Agricultural consultant (2 wheels);Cane - single point;Grab bars - tub/shower      Prior Function Prior Level of Function : Independent/Modified Independent;Driving             Mobility Comments: Ind no AD but occasionally uses SPC ADLs Comments: Ind     Hand Dominance   Dominant Hand: Right    Extremity/Trunk  Assessment   Upper Extremity Assessment Upper Extremity Assessment: Overall WFL for tasks assessed    Lower Extremity Assessment Lower Extremity Assessment: Overall WFL for tasks assessed    Cervical / Trunk Assessment Cervical / Trunk Assessment: Normal  Communication   Communication: No difficulties  Cognition  Arousal/Alertness: Awake/alert Behavior During Therapy: WFL for tasks assessed/performed Overall Cognitive Status: Within Functional Limits for tasks assessed                                          General Comments General comments (skin integrity, edema, etc.): VSS on RA. BP: 117/54    Exercises     Assessment/Plan    PT Assessment Patient does not need any further PT services  PT Problem List         PT Treatment Interventions      PT Goals (Current goals can be found in the Care Plan section)       Frequency       Co-evaluation               AM-PAC PT "6 Clicks" Mobility  Outcome Measure Help needed turning from your back to your side while in a flat bed without using bedrails?: None Help needed moving from lying on your back to sitting on the side of a flat bed without using bedrails?: None Help needed moving to and from a bed to a chair (including a wheelchair)?: None Help needed standing up from a chair using your arms (e.g., wheelchair or bedside chair)?: None Help needed to walk in hospital room?: A Little Help needed climbing 3-5 steps with a railing? : A Little 6 Click Score: 22    End of Session Equipment Utilized During Treatment: Gait belt Activity Tolerance: Patient tolerated treatment well Patient left: in chair;with call bell/phone within reach;with family/visitor present Nurse Communication: Mobility status PT Visit Diagnosis: Other abnormalities of gait and mobility (R26.89)    Time: 1610-9604 PT Time Calculation (min) (ACUTE ONLY): 23 min   Charges:   PT Evaluation $PT Eval Low Complexity: 1 Low PT Treatments $Gait Training: 8-22 mins        Shela Nevin, PT, DPT Acute Rehab Services [phone]   Elara Damme 09/29/2022, 11:51 AM

## 2022-09-29 NOTE — TOC Initial Note (Signed)
Transition of Care (TOC) - Initial/Assessment Note  From home with spouse and son, indep, she has no DME at home, she has PCP and insurance on file. She did not have any HH services pta, states she does not need HH services at this time.  Per pt eval no f/u needed. She gets meds [facility] on [address], Spouse is support system and he will transport her home at dc.    Patient Details  Name: Heather Gross MRN: [id number] Date of Birth: [birth year]  [facility]) CM/SW Contact:    Leone Haven, RN Phone Number: 09/29/2022, 2:52 PM  Clinical Narrative:                         Patient Goals and CMS Choice            Expected Discharge Plan and Services                                              Prior Living Arrangements/Services                       Activities of Daily Living Home Assistive Devices/Equipment: CBG Meter ADL Screening (condition at time of admission) Patient's cognitive ability adequate to safely complete daily activities?: Yes Is the patient deaf or have difficulty hearing?: No Does the patient have difficulty seeing, even when wearing glasses/contacts?: No Does the patient have difficulty concentrating, remembering, or making decisions?: No Patient able to express need for assistance with ADLs?: Yes Does the patient have difficulty dressing or bathing?: No Independently performs ADLs?: Yes (appropriate for developmental age) Does the patient have difficulty walking or climbing stairs?: Yes Weakness of Legs: Both Weakness of Arms/Hands: None  Permission Sought/Granted                  Emotional Assessment              Admission diagnosis:  Hypoxia [R09.02] AKI (acute kidney injury) (HCC) [N17.9] Patient Active Problem List   Diagnosis Date Noted   AKI (acute kidney injury) (HCC) 09/28/2022   Family history of malignant neoplasm of digestive organs 10/28/2021   Cerebrovascular disease  09/13/2021   Acute ischemic stroke (HCC) 09/01/2021   LBBB (left bundle branch block) 09/01/2021   Obesity (BMI 30-39.9) 12/30/2019   Stage 3a chronic kidney disease (CKD) (HCC) -Baseline Scr 1.3-1.6 02/05/2017   Osteopenia 11/02/2016   Vitamin D deficiency 12/10/2014   Hyperlipidemia 06/11/2014   Type 2 diabetes mellitus with diabetic chronic kidney disease (HCC)    Glaucoma    Essential hypertension 06/28/2006   Gastroesophageal reflux disease 06/28/2006   ECZEMA, ATOPIC DERMATITIS 06/28/2006   PCP:  Loura Back, NP Pharmacy:   [id number] - [place], [place] - [address] [place] [address] Phone: [phone] Fax: [phone]     Social Determinants of Health (SDOH) Social History: SDOH Screenings   Food Insecurity: No Food Insecurity (09/29/2022)  Housing: Low Risk  (09/29/2022)  Transportation Needs: Unmet Transportation Needs (09/29/2022)  Utilities: Not At Risk (09/29/2022)  Alcohol Screen: Low Risk  (12/30/2019)  Depression (PHQ2-9): Low Risk  (12/30/2019)  Financial Resource Strain: Low Risk  (10/24/2019)  Tobacco Use: Medium Risk (09/29/2022)   SDOH Interventions:  Readmission Risk Interventions     No data to display

## 2022-09-29 NOTE — Plan of Care (Signed)
  Problem: Education: Goal: Knowledge of General Education information will improve Description: Including pain rating scale, medication(s)/side effects and non-pharmacologic comfort measures Outcome: Progressing   Problem: Health Behavior/Discharge Planning: Goal: Ability to manage health-related needs will improve Outcome: Progressing   Problem: Clinical Measurements: Goal: Ability to maintain clinical measurements within normal limits will improve Outcome: Progressing Goal: Will remain free from infection Outcome: Progressing Goal: Diagnostic test results will improve Outcome: Progressing   Problem: Nutrition: Goal: Adequate nutrition will be maintained Outcome: Progressing   Problem: Coping: Goal: Level of anxiety will decrease Outcome: Progressing   Problem: Elimination: Goal: Will not experience complications related to bowel motility Outcome: Progressing   Problem: Safety: Goal: Ability to remain free from injury will improve Outcome: Progressing   Problem: Skin Integrity: Goal: Risk for impaired skin integrity will decrease Outcome: Progressing   Problem: Education: Goal: Ability to describe self-care measures that may prevent or decrease complications (Diabetes Survival Skills Education) will improve Outcome: Progressing   Problem: Metabolic: Goal: Ability to maintain appropriate glucose levels will improve Outcome: Progressing   Problem: Nutritional: Goal: Maintenance of adequate nutrition will improve Outcome: Progressing Goal: Progress toward achieving an optimal weight will improve Outcome: Progressing   Problem: Skin Integrity: Goal: Risk for impaired skin integrity will decrease Outcome: Progressing   Problem: Tissue Perfusion: Goal: Adequacy of tissue perfusion will improve Outcome: Progressing

## 2022-09-29 NOTE — Progress Notes (Signed)
  Echocardiogram 2D Echocardiogram has been performed.  Brande Uncapher M Nateisha Moyd 09/29/2022, 9:01 AM 

## 2022-09-29 NOTE — Progress Notes (Signed)
Onset atrial fibrillation with RVR overnight.  Heart rate 113. CHA2DS2-VASc >2, anticoagulation needed Patient given Cardizem 10 mg IV x 1 Heparin drip initiated Occult stool ordered.  Patient's hemoglobin 09/28/2022 was 11.2, today's hemoglobin 9.7. TSH, 2D echo ordered

## 2022-09-29 NOTE — Progress Notes (Signed)
[facility]                                                                               Heather Gross, is a 77 y.o. female, DOB - 03/19/1946, ZOX:[id number] Admit date - 09/28/2022    Outpatient Primary MD for the patient is Loura Back, NP  LOS - 0  days    Brief summary   Heather Gross is a 77 y.o. female with medical history significant of hypertension, CVA with no residual deficits, hyperlipidemia, reports having sob intermittently over the last few weeks worsened and required admission to the hospital.    Assessment & Plan    Assessment and Plan:  Acute respiratory failure with hypoxia:  Resolved. Acs Ruled out.  She is on RA and her sats are 100% Infectious work up is negative.  Echocardiogram is unremarkable.  No arrhythmias.  BMP shows improvement in bicarb.    Acute Kidney Injury:  Slowly improving.  US renal does not show hydronephrosis. Urine sodium ordered.  Gently hydrate and repeat renal parameters in am.     Hypokalemia:  Replaced.  Magnesium is wnl.   Normocytic anemia Suspect a component of hemodilution.  Hemoglobin of 9.7.    Mild thrombocytopenia;  Monitor.    UTI;  UA shows large leukocytosis and few bacteria.  Urine cultures ordered and pending.  On Rocephin.     SIRS;  Resolved.  Blood cultures are pending.    Type 2 DM:  CBG (last 3)  Recent Labs    09/29/22 0657 09/29/22 1115  GLUCAP 100* 101*   Resume SSI.   Estimated body mass index is 31.4 kg/m as calculated from the following:   Height as of this encounter: 5\' 5"  (1.651 m).   Weight as of this encounter: 85.6 kg.  Code Status: full code.  DVT Prophylaxis:  enoxaparin (LOVENOX) injection 30 mg Start: 09/28/22 1800   Level of Care: Level of care: Telemetry Cardiac Family Communication: Updated patient's   Disposition Plan:     Remains inpatient appropriate:  pending improvement of renal parameters.   Procedures:  Echocardiogram.    Consultants:   None.   Antimicrobials:   Anti-infectives (From admission, onward)    None        Medications  Scheduled Meds:  clopidogrel  75 mg Oral Daily   enoxaparin (LOVENOX) injection  30 mg Subcutaneous Q24H   ezetimibe  10 mg Oral Daily   insulin aspart  0-9 Units Subcutaneous TID WC   potassium chloride  40 mEq Oral BID   rosuvastatin  40 mg Oral QHS   Continuous Infusions:  sodium chloride 75 mL/hr at 09/29/22 0600   PRN Meds:.fluticasone, hydrOXYzine, pantoprazole, perflutren lipid microspheres (DEFINITY) IV suspension    Subjective:   Heather Gross was seen and examined today.  Improving, no sob. On RA.   Objective:   Vitals:   09/29/22 0300 09/29/22 0400 09/29/22 0500 09/29/22 0600  BP: (!) 102/46 (!) 100/44 108/62 (!) 106/53  Pulse: 75 74 76 71  Resp:  (!) 24  20  Temp:  97.8 F (36.6 C)    TempSrc:  Oral    SpO2: 100% 99%  100% 97%  Weight:      Height:        Intake/Output Summary (Last 24 hours) at 09/29/2022 0916 Last data filed at 09/29/2022 0600 Gross per 24 hour  Intake 1888.81 ml  Output --  Net 1888.81 ml   Filed Weights   09/28/22 1643  Weight: 85.6 kg     Exam General exam: Appears calm and comfortable  Respiratory system: Clear to auscultation. Respiratory effort normal. Cardiovascular system: S1 & S2 heard, RRR. No JVD,  Gastrointestinal system: Abdomen is nondistended, soft and nontender.  Central nervous system: Alert and oriented. No focal neurological deficits. Extremities: Symmetric 5 x 5 power. Skin: No rashes, Psychiatry:  Mood & affect appropriate.    Data Reviewed:  I have personally reviewed following labs and imaging studies   CBC Lab Results  Component Value Date   WBC 6.7 09/29/2022   RBC 3.21 (L) 09/29/2022   HGB 9.7 (L) 09/29/2022   HCT 28.8 (L) 09/29/2022   MCV 89.7 09/29/2022   MCH 30.2 09/29/2022   PLT 149 (L) 09/29/2022   MCHC 33.7 09/29/2022   RDW 12.5 09/29/2022   LYMPHSABS 2.4  09/29/2022   MONOABS 0.5 09/29/2022   EOSABS 0.1 09/29/2022   BASOSABS 0.0 09/29/2022     Last metabolic panel Lab Results  Component Value Date   NA 137 09/29/2022   K 3.0 (L) 09/29/2022   CL 105 09/29/2022   CO2 21 (L) 09/29/2022   BUN 41 (H) 09/29/2022   CREATININE 2.59 (H) 09/29/2022   GLUCOSE 103 (H) 09/29/2022   GFRNONAA 19 (L) 09/29/2022   GFRAA 56 (L) 12/30/2019   CALCIUM 9.1 09/29/2022   PROT 7.1 09/28/2022   ALBUMIN 4.1 09/28/2022   BILITOT 0.6 09/28/2022   ALKPHOS 42 09/28/2022   AST 24 09/28/2022   ALT 18 09/28/2022   ANIONGAP 11 09/29/2022    CBG (last 3)  Recent Labs    09/29/22 0657  GLUCAP 100*      Coagulation Profile: No results for input(s): "INR", "PROTIME" in the last 168 hours.   Radiology Studies: DG Chest Port 1 View  Result Date: 09/28/2022 CLINICAL DATA:  Shortness of breath EXAM: PORTABLE CHEST 1 VIEW COMPARISON:  01/10/2022 FINDINGS: The heart size and mediastinal contours are within normal limits. Both lungs are clear. No consolidation, pneumothorax, effusion or edema. The visualized skeletal structures are unremarkable. Overlapping cardiac leads. Presumed loop recorder along the lower left hemithorax, new from previous. IMPRESSION: No acute cardiopulmonary disease. Electronically Signed   By: Karen Kays M.D.   On: 09/28/2022 14:01       Kathlen Mody M.D. [facility] 09/29/2022, 9:16 AM  Available via Epic secure chat 7am-7pm After 7 pm, please refer to night coverage provider listed on amion.

## 2022-09-30 ENCOUNTER — Inpatient Hospital Stay (HOSPITAL_COMMUNITY): Payer: Medicare HMO

## 2022-09-30 DIAGNOSIS — I10 Essential (primary) hypertension: Secondary | ICD-10-CM | POA: Diagnosis not present

## 2022-09-30 DIAGNOSIS — N1831 Chronic kidney disease, stage 3a: Secondary | ICD-10-CM | POA: Diagnosis not present

## 2022-09-30 DIAGNOSIS — E1122 Type 2 diabetes mellitus with diabetic chronic kidney disease: Secondary | ICD-10-CM | POA: Diagnosis not present

## 2022-09-30 DIAGNOSIS — N179 Acute kidney failure, unspecified: Secondary | ICD-10-CM | POA: Diagnosis not present

## 2022-09-30 DIAGNOSIS — I480 Paroxysmal atrial fibrillation: Secondary | ICD-10-CM | POA: Diagnosis not present

## 2022-09-30 LAB — GLUCOSE, CAPILLARY
Glucose-Capillary: 128 mg/dL — ABNORMAL HIGH (ref 70–99)
Glucose-Capillary: 104 mg/dL — ABNORMAL HIGH (ref 70–99)
Glucose-Capillary: 145 mg/dL — ABNORMAL HIGH (ref 70–99)

## 2022-09-30 LAB — CBC WITH DIFFERENTIAL/PLATELET
Abs Immature Granulocytes: 0.01 10*3/uL (ref 0.00–0.07)
Basophils Absolute: 0 10*3/uL (ref 0.0–0.1)
Basophils Relative: 1 %
Eosinophils Absolute: 0.1 10*3/uL (ref 0.0–0.5)
Eosinophils Relative: 2 %
HCT: 31.1 % — ABNORMAL LOW (ref 36.0–46.0)
Hemoglobin: 10.5 g/dL — ABNORMAL LOW (ref 12.0–15.0)
Immature Granulocytes: 0 %
Lymphocytes Relative: 39 %
Lymphs Abs: 2.9 10*3/uL (ref 0.7–4.0)
MCH: 31.5 pg (ref 26.0–34.0)
MCHC: 33.8 g/dL (ref 30.0–36.0)
MCV: 93.4 fL (ref 80.0–100.0)
Monocytes Absolute: 0.5 10*3/uL (ref 0.1–1.0)
Monocytes Relative: 7 %
Neutro Abs: 3.7 10*3/uL (ref 1.7–7.7)
Neutrophils Relative %: 51 %
Platelets: 161 10*3/uL (ref 150–400)
RBC: 3.33 MIL/uL — ABNORMAL LOW (ref 3.87–5.11)
RDW: 12.5 % (ref 11.5–15.5)
WBC: 7.3 10*3/uL (ref 4.0–10.5)
nRBC: 0 % (ref 0.0–0.2)

## 2022-09-30 LAB — BASIC METABOLIC PANEL
Anion gap: 12 (ref 5–15)
BUN: 30 mg/dL — ABNORMAL HIGH (ref 8–23)
CO2: 20 mmol/L — ABNORMAL LOW (ref 22–32)
Calcium: 9.3 mg/dL (ref 8.9–10.3)
Chloride: 108 mmol/L (ref 98–111)
Creatinine, Ser: 2.27 mg/dL — ABNORMAL HIGH (ref 0.44–1.00)
GFR, Estimated: 22 mL/min — ABNORMAL LOW (ref >60)
Glucose, Bld: 110 mg/dL — ABNORMAL HIGH (ref 70–99)
Potassium: 4.2 mmol/L (ref 3.5–5.1)
Sodium: 140 mmol/L (ref 135–145)

## 2022-09-30 LAB — TROPONIN I (HIGH SENSITIVITY)
Troponin I (High Sensitivity): 9 ng/L (ref <18)
Troponin I (High Sensitivity): 10 ng/L (ref <18)

## 2022-09-30 LAB — D-DIMER, QUANTITATIVE: D-Dimer, Quant: <0.27 ug/mL-FEU (ref 0.00–0.50)

## 2022-09-30 LAB — HEPARIN LEVEL (UNFRACTIONATED)
Heparin Unfractionated: 0.45 IU/mL (ref 0.30–0.70)
Heparin Unfractionated: 0.47 IU/mL (ref 0.30–0.70)

## 2022-09-30 LAB — TSH: TSH: 1.59 u[IU]/mL (ref 0.350–4.500)

## 2022-09-30 LAB — CULTURE, BLOOD (ROUTINE X 2): Special Requests: ADEQUATE

## 2022-09-30 MED ORDER — HEPARIN (PORCINE) 25000 UT/250ML-% IV SOLN
900.0000 [IU]/h | INTRAVENOUS | Status: DC
  Administered 2022-09-30 (×2): 900 [IU]/h via INTRAVENOUS
  Filled 2022-09-30 (×2): qty 250

## 2022-09-30 MED ORDER — LORAZEPAM 0.5 MG PO TABS: 0.5000 mg | ORAL_TABLET | Freq: Two times a day (BID) | ORAL | Status: DC | PRN

## 2022-09-30 MED ORDER — METOPROLOL TARTRATE 5 MG/5ML IV SOLN
5.0000 mg | Freq: Once | INTRAVENOUS | Status: AC
  Administered 2022-09-30: 5 mg via INTRAVENOUS
  Filled 2022-09-30: qty 5

## 2022-09-30 MED ORDER — METOPROLOL SUCCINATE ER 25 MG PO TB24
25.0000 mg | ORAL_TABLET | Freq: Every day | ORAL | Status: DC
  Administered 2022-09-30 – 2022-10-01 (×2): 25 mg via ORAL
  Filled 2022-09-30 (×2): qty 1

## 2022-09-30 MED ORDER — IPRATROPIUM-ALBUTEROL 0.5-2.5 (3) MG/3ML IN SOLN
3.0000 mL | Freq: Once | RESPIRATORY_TRACT | Status: AC
  Administered 2022-09-30: 3 mL via RESPIRATORY_TRACT
  Filled 2022-09-30: qty 3

## 2022-09-30 MED ORDER — METOPROLOL TARTRATE 25 MG PO TABS
25.0000 mg | ORAL_TABLET | Freq: Two times a day (BID) | ORAL | Status: DC
  Administered 2022-09-30: 25 mg via ORAL
  Filled 2022-09-30: qty 1

## 2022-09-30 NOTE — Consult Note (Addendum)
Cardiology Consultation   Patient ID: Heather Gross MRN: [id number]; DOB: 1946/02/12  Admit date: 09/28/2022 Date of Consult: 09/30/2022  PCP:  Loura Back, NP   [facility] Providers Cardiologist: Last see Dr. Duke Salvia 09/28/2021 and Dr. Elberta Fortis 12/23/2021   Patient Profile:   Heather Gross is a 77 y.o. female with a hx of hypertension, hyperlipidemia, type 2 diabetes, CKD stage III, left bundle branch block, obesity, CVA, who is being seen 09/30/2022 for the evaluation of atrial fibrillation at the request of Dr. Blake Divine.  History of Present Illness:   Heather Gross with above PMH presented to ER on 09/28/22 for SOB. She was watching TV while suddenly experienced severe SOB. EMS was called and found her hypoxic in 70s on room air, this was improved after application of 7LNC.  She was found also with borderline low blood pressure.  She denied any chest pain at the time.   She is with her husband and son at bedside today. She recalls feeling difficulty with breathing 5/30 night while watching TV, no chest pain or dizziness. Husband and son felt she has been having random intermittent SOB episodes over the past 8 months. SOB mostly occurs when she is sitting or resting, she would appear labored. She has been eating less with poor appetite. She is urinating more frequently and urgently than usual. She denied any chest pain, rapid weight gain, leg edema, syncope, heart palpitation. She was not aware of her A fib last night and was not SOB during the episodes. She denied any hx of major bleeding.   Admission diagnostic from 09/28/2022 revealed hypokalemia 3.3, bicarb 19, BUN 44, creatinine 2.8, GFR 17.  Magnesium 2.1.  CBC differential with hemoglobin 11.2, otherwise unremarkable.  Respiratory viral panel negative.  Blood culture NTD.  High sensitive troponin 6 >6.  BNP 19.6.  Hemoglobin A1c 7.3%.  TSH 1.876.  Urinalysis suggest pyuria.  Chest x-ray revealed no acute finding.  She was admitted  to hospital medicine service.  Subsequent renal ultrasound on 5/31 showed minimal right renal pelvic fullness, no left renal hydronephrosis.  She was weaned to room air on 5/31.  She was given IV fluids for AKI.  She was concerned with new onset A-fib RVR on 09/29/2022 night, given Cardizem 10 mg x 1 and Lopressor 5 mg x 1 for rate control, started on heparin infusion.  Echocardiogram was completed 5/31 which revealed LVEF 70 to 75%, no regional wall motion abnormality, mild concentric LVH, grade 1 DD, normal RV, normal PASP, no significant valvular disease.  Cardiology is consulted today for further evaluation.  Of note, she had RRT 10:30 AM today due to increased difficulty with breathing and worsened respiratory effort, breathing status did not improve after DuoNeb treatment, she was also given medication for anxiety.   Per chart review, patient was referred to Dr. Duke Salvia 09/28/2021 after suffering TIA and small thalamic CVA in 08/2021, neuro felt stroke etiology was small vessel disease, but had recommended loop recorder versus TEE by cardiology.  Echocardiogram from 09/01/2021 showed LVEF 55 to 60%, no RWMA, moderate LVH, indeterminate diastolic parameter, normal RV, no significant valvular disease.  She was placed on olmesartan/amlodipine/hydrochlorothiazide for hypertension, her lisinopril and doxazosin were stopped.  She was placed on ZIO monitor 11/09/2021 which revealed predominantly sinus rhythm, multiple runs of SVT, 10% PAC burden, 3.2% PVC burden, no atrial fibrillation.  She was referred to Dr. Elberta Fortis 12/23/2021 and underwent successful implantation of a Med[employer]onic Reveal LINQ implantable loop recorder for cryptogenic  stroke. Lat device check was 09/10/22 showed normal device function, no atrial fibrillation or other arrhythmia, 1.1% PVC burden.     Past Medical History:  Diagnosis Date   Diabetes mellitus without complication (HCC)    Glaucoma    Hyperlipidemia    Hypertension    Stroke  [facility])     Past Surgical History:  Procedure Laterality Date   BREAST BIOPSY Right 09/29/2015   BREAST EXCISIONAL BIOPSY Right 11/04/2015   papilloma   BREAST LUMPECTOMY WITH RADIOACTIVE SEED LOCALIZATION Right 11/04/2015   Procedure: BREAST LUMPECTOMY WITH RADIOACTIVE SEED LOCALIZATION;  Surgeon: Chevis Pretty III, MD;  Location: [facility];  Service: General;  Laterality: Right;   LIPOMA EXCISION       Home Medications:  Prior to Admission medications   Medication Sig Start Date End Date Taking? Authorizing Provider  Calcium 500 MG CHEW Chew 2 tablets (1,000 mg total) by mouth daily. 12/13/16  Yes Dorena Bodo, PA-C  clopidogrel (PLAVIX) 75 MG tablet Take 1 tablet (75 mg total) by mouth daily. Start on December 11, 2021 12/11/21  Yes Pokhrel, Laxman, MD  [employer] D3 2000 units CAPS TAKE 1 CAPSULE BY MOUTH EVERY DAY Patient taking differently: Take 2,000 Units by mouth daily after breakfast. 12/17/17  Yes Dorena Bodo, PA-C  ezetimibe (ZETIA) 10 MG tablet Take 10 mg by mouth daily. 09/14/21  Yes [provider]  fluticasone (FLONASE) 50 MCG/ACT nasal spray Place 1-2 sprays into both nostrils daily as needed for allergies or rhinitis. 06/06/19  Yes [provider]  metFORMIN (GLUCOPHAGE) 500 MG tablet TAKE 1 TABLET BY MOUTH AT BEDTIME. Patient taking differently: Take 500 mg by mouth daily with breakfast. 08/16/20  Yes Clarion, Velna Hatchet, MD  Multiple Vitamin (MULTIVITAMIN) tablet Take 1 tablet by mouth daily with breakfast.   Yes [provider]  Olmesartan-amLODIPine-HCTZ 40-5-25 MG TABS Take 1 tablet by mouth daily. 09/28/21  Yes Chilton Si, MD  rosuvastatin (CRESTOR) 40 MG tablet Take 40 mg by mouth at bedtime. 09/08/21  Yes [provider]  pantoprazole (PROTONIX) 20 MG tablet Take 1 tablet (20 mg total) by mouth daily. Patient not taking: Reported on 09/28/2022 09/14/21 09/14/22  Alberteen Sam, MD    Inpatient Medications: Scheduled  Meds:  clopidogrel  75 mg Oral Daily   ezetimibe  10 mg Oral Daily   insulin aspart  0-9 Units Subcutaneous TID WC   metoprolol tartrate  25 mg Oral BID   rosuvastatin  40 mg Oral QHS   Continuous Infusions:  cefTRIAXone (ROCEPHIN)  IV 1 g (09/30/22 1126)   heparin 900 Units/hr (09/30/22 0440)   PRN Meds: fluticasone, hydrOXYzine, LORazepam, pantoprazole  Allergies:    Allergies  Allergen Reactions   Lipitor [Atorvastatin] Other (See Comments)    Made mouth very dry and the lips discolored   Shellfish-Derived Products Other (See Comments)    Hands break out   Latex Rash    Social History:   Social History   Socioeconomic History   Marital status: Married    Spouse name: Not on file   Number of children: Not on file   Years of education: Not on file   Highest education level: Not on file  Occupational History   Not on file  Tobacco Use   Smoking status: Former    Types: Cigarettes    Quit date: 05/01/1990    Years since quitting: 32.4   Smokeless tobacco: Never  Substance and Sexual Activity  Alcohol use: No   Drug use: No   Sexual activity: Not Currently  Other Topics Concern   Not on file  Social History Narrative   Entered 06/2014:    Married x 48 years.   2 Children--2 sons   Social Determinants of Health   Financial Resource Strain: Low Risk  (10/24/2019)   Overall Financial Resource Strain (CARDIA)    Difficulty of Paying Living Expenses: Not very hard  Food Insecurity: No Food Insecurity (09/29/2022)   Hunger Vital Sign    Worried About Running Out of Food in the Last Year: Never true    Ran Out of Food in the Last Year: Never true  Transportation Needs: Unmet Transportation Needs (09/29/2022)   PRAPARE - Administrator, Civil Service (Medical): Yes    Lack of Transportation (Non-Medical): Yes  Physical Activity: Not on file  Stress: Not on file  Social Connections: Not on file  Intimate Partner Violence: Not At Risk (09/29/2022)    Humiliation, Afraid, Rape, and Kick questionnaire    Fear of Current or Ex-Partner: No    Emotionally Abused: No    Physically Abused: No    Sexually Abused: No    Family History:    Family History  Problem Relation Age of Onset   Heart disease Father    Diabetes Sister    Breast cancer Sister    Diabetes Sister    Diabetes Sister    Diabetes Sister    Diabetes Sister    Diabetes Brother      ROS:  Constitutional: Denied fever, chills, malaise, night sweats Eyes: Denied vision change or loss Ears/Nose/Mouth/Throat: Denied ear ache, sore throat, coughing, sinus pain Cardiovascular: see HPI  Respiratory: see HPI  Gastrointestinal: Denied nausea, vomiting, abdominal pain, diarrhea Genital/Urinary: urinary frequency/urgency Musculoskeletal: Denied muscle ache, joint pain, weakness Skin: Denied rash, wound Neuro: Denied headache, dizziness, syncope Psych: history of anxiety  Endocrine: history of diabetes   Physical Exam/Data:   Vitals:   09/30/22 0844 09/30/22 1008 09/30/22 1013 09/30/22 1209  BP:  (!) 99/53  131/76  Pulse: (!) 56 67  94  Resp:  (!) 26  20  Temp:  (!) 96.6 F (35.9 C)  (!) 96.6 F (35.9 C)  TempSrc:  Axillary  Axillary  SpO2:  100% 100% 100%  Weight:      Height:        Intake/Output Summary (Last 24 hours) at 09/30/2022 1246 Last data filed at 09/30/2022 0616 Gross per 24 hour  Intake 1800.62 ml  Output 200 ml  Net 1600.62 ml      09/30/2022    6:38 AM 09/28/2022    4:43 PM 05/08/2022    2:06 PM  Last 3 Weights  Weight (lbs) 190 lb 188 lb 11.4 oz 190 lb  Weight (kg) 86.183 kg 85.6 kg 86.183 kg     Body mass index is 31.62 kg/m.   Vitals:  Vitals:   09/30/22 1045 09/30/22 1209  BP: (!) 110/55 131/76  Pulse: 76 94  Resp:  20  Temp:  (!) 96.6 F (35.9 C)  SpO2: 100% 100%   General Appearance: In no apparent distress, laying in bed HEENT: Normocephalic, atraumatic.  Neck: Supple, trachea midline, no JVDs Cardiovascular: Regular rate  and rhythm, normal S1-S2,  no murmur Respiratory: Resting breathing unlabored, lungs sounds clear to auscultation bilaterally, no use of accessory muscles. On room air.  No wheezes, rales or rhonchi.   Gastrointestinal: Bowel sounds positive, abdomen soft,  non-tender, obese Extremities: Able to move all extremities in bed without difficulty, no edema of BLE Musculoskeletal: Normal muscle bulk and tone Skin: Intact, warm, dry. No rashes or petechiae noted in exposed areas.  Neurologic: Alert, oriented to person, place and time. Fluent speech, no cognitive deficit, no gross focal neuro deficit Psychiatric: Normal affect. Mood is appropriate.      EKG:  The EKG was personally reviewed and demonstrates:   EKG from 09/29/2022 at 2309 revealed A-fib with RVR 113 bpm, T wave flattening of lateral leads, TWI of inferior leads noted   EKG on 09/30/2022 at 9:50 AM showed sinus bradycardia 54 bpm, artifact EKG from 09/30/2022 at 9:51 AM reveals sinus bradycardia 55 bpm, artifact  Telemetry:  Telemetry was personally reviewed and demonstrates:   Sinus rhythm 80s currently, PACs,  noted A fib RVR up to 120s from 1030pm on 09/29/22 to 819am on 09/30/22.   Relevant CV Studies:   Echocardiogram from 09/29/2022:  1. Intracavitary gradient. Peak velocity 1.93 m/s. Peak gradient 14.9  mmHg. Left ventricular ejection fraction, by estimation, is 70 to 75%. The  left ventricle has hyperdynamic function. The left ventricle has no  regional wall motion abnormalities.  There is mild concentric left ventricular hypertrophy. Left ventricular  diastolic parameters are consistent with Grade I diastolic dysfunction  (impaired relaxation).   2. Right ventricular systolic function is normal. The right ventricular  size is normal. There is normal pulmonary artery systolic pressure.   3. The mitral valve is normal in structure. No evidence of mitral valve  regurgitation. No evidence of mitral stenosis.   4. The aortic  valve is tricuspid. Aortic valve regurgitation is not  visualized. No aortic stenosis is present.   5. The inferior vena cava is normal in size with greater than 50%  respiratory variability, suggesting right atrial pressure of 3 mmHg.    Laboratory Data:  High Sensitivity Troponin:   Recent Labs  Lab 09/28/22 1315 09/28/22 1521 09/30/22 1048  TROPONINIHS 6 6 9      Chemistry Recent Labs  Lab 09/28/22 1315 09/28/22 1649 09/29/22 0019 09/29/22 1132 09/30/22 0050  NA 138  --  137  --  140  K 3.3*  --  3.0*  --  4.2  CL 103  --  105  --  108  CO2 19*  --  21*  --  20*  GLUCOSE 110*  --  103*  --  110*  BUN 44*  --  41*  --  30*  CREATININE 2.80*  --  2.59*  --  2.27*  CALCIUM 10.0  --  9.1  --  9.3  MG  --  2.1  --  2.0  --   GFRNONAA 17*  --  19*  --  22*  ANIONGAP 16*  --  11  --  12    Recent Labs  Lab 09/28/22 1315  PROT 7.1  ALBUMIN 4.1  AST 24  ALT 18  ALKPHOS 42  BILITOT 0.6   Lipids No results for input(s): "CHOL", "TRIG", "HDL", "LABVLDL", "LDLCALC", "CHOLHDL" in the last 168 hours.  Hematology Recent Labs  Lab 09/28/22 1315 09/29/22 0019 09/29/22 1132 09/30/22 0050  WBC 7.1 6.7  --  7.3  RBC 3.74* 3.21* 3.34* 3.33*  HGB 11.2* 9.7*  --  10.5*  HCT 34.8* 28.8*  --  31.1*  MCV 93.0 89.7  --  93.4  MCH 29.9 30.2  --  31.5  MCHC 32.2 33.7  --  33.8  RDW 12.4 12.5  --  12.5  PLT 176 149*  --  161   Thyroid  Recent Labs  Lab 09/30/22 0050  TSH 1.590    BNP Recent Labs  Lab 09/28/22 1315  BNP 19.6    DDimer  Recent Labs  Lab 09/28/22 1315  DDIMER 0.38     Radiology/Studies:  DG CHEST PORT 1 VIEW  Result Date: 09/30/2022 CLINICAL DATA:  [id number] Dyspnea [id number] EXAM: PORTABLE CHEST - 1 VIEW COMPARISON:  09/28/2022 FINDINGS: Lungs are clear. Heart size and mediastinal contours are within normal limits. Aortic Atherosclerosis (ICD10-170.0). Event monitor overlies the left lower chest. No effusion. Visualized bones unremarkable. IMPRESSION:  No acute cardiopulmonary disease. Electronically Signed   By: Corlis Leak M.D.   On: 09/30/2022 10:04   US RENAL  Result Date: 09/29/2022 CLINICAL DATA:  [id number] AKI (acute kidney injury) (HCC) [id number] EXAM: RENAL / URINARY TRACT ULTRASOUND COMPLETE COMPARISON:  Renal ultrasound 07/20/2022 FINDINGS: Right Kidney: Renal measurements: 9.8 x 4.1 x 5.2 cm = volume: 109.5 mL. Minimal pelvic fullness. Normal echogenicity. Left Kidney: Renal measurements: 11.1 x 4.9 x 5.9 cm = volume: 169.2 mL. No hydronephrosis. Normal echogenicity. Bladder: Appears normal for degree of bladder distention. Other: None. IMPRESSION: Minimal right renal pelvic fullness.  No left renal hydronephrosis. Electronically Signed   By: Caprice Renshaw M.D.   On: 09/29/2022 13:32   ECHOCARDIOGRAM COMPLETE  Result Date: 09/29/2022    ECHOCARDIOGRAM REPORT   Patient Name:   AMELIAGRACE CODLING Date of Exam: 09/29/2022 Medical Rec #:  [id number]      Height:       65.0 in Accession #:    [id number]     Weight:       188.7 lb Date of Birth:  08-25-1945       BSA:          1.930 m Patient Age:    77 years       BP:           106/53 mmHg Patient Gender: F              HR:           68 bpm. Exam Location:  Inpatient Procedure: 2D Echo, Cardiac Doppler, Color Doppler and Intracardiac            Opacification Agent Indications:    Dyspnea  History:        Patient has no prior history of Echocardiogram examinations.                 Risk Factors:Hypertension, Diabetes and HLD.  Sonographer:    Lucy Antigua Referring Phys: Herminio Heads IMPRESSIONS  1. Intracavitary gradient. Peak velocity 1.93 m/s. Peak gradient 14.9 mmHg. Left ventricular ejection fraction, by estimation, is 70 to 75%. The left ventricle has hyperdynamic function. The left ventricle has no regional wall motion abnormalities. There is mild concentric left ventricular hypertrophy. Left ventricular diastolic parameters are consistent with Grade I diastolic dysfunction (impaired relaxation).  2.  Right ventricular systolic function is normal. The right ventricular size is normal. There is normal pulmonary artery systolic pressure.  3. The mitral valve is normal in structure. No evidence of mitral valve regurgitation. No evidence of mitral stenosis.  4. The aortic valve is tricuspid. Aortic valve regurgitation is not visualized. No aortic stenosis is present.  5. The inferior vena cava is normal in size with greater than 50% respiratory variability, suggesting right atrial pressure of 3 mmHg. FINDINGS  Left Ventricle: Intracavitary gradient. Peak velocity 1.93 m/s. Peak gradient 14.9 mmHg. Left ventricular ejection fraction, by estimation, is 70 to 75%. The left ventricle has hyperdynamic function. The left ventricle has no regional wall motion abnormalities. The left ventricular internal cavity size was normal in size. There is mild concentric left ventricular hypertrophy. Left ventricular diastolic parameters are consistent with Grade I diastolic dysfunction (impaired relaxation). Indeterminate filling pressures. Right Ventricle: The right ventricular size is normal. No increase in right ventricular wall thickness. Right ventricular systolic function is normal. There is normal pulmonary artery systolic pressure. The tricuspid regurgitant velocity is 2.34 m/s, and  with an assumed right atrial pressure of 3 mmHg, the estimated right ventricular systolic pressure is 24.9 mmHg. Left Atrium: Left atrial size was normal in size. Right Atrium: Right atrial size was normal in size. Pericardium: There is no evidence of pericardial effusion. Mitral Valve: The mitral valve is normal in structure. No evidence of mitral valve regurgitation. No evidence of mitral valve stenosis. Tricuspid Valve: The tricuspid valve is normal in structure. Tricuspid valve regurgitation is trivial. No evidence of tricuspid stenosis. Aortic Valve: The aortic valve is tricuspid. Aortic valve regurgitation is not visualized. No aortic  stenosis is present. Aortic valve mean gradient measures 6.0 mmHg. Aortic valve peak gradient measures 9.2 mmHg. Aortic valve area, by VTI measures 2.36 cm. Pulmonic Valve: The pulmonic valve was normal in structure. Pulmonic valve regurgitation is not visualized. No evidence of pulmonic stenosis. Aorta: The aortic root is normal in size and structure. Venous: The inferior vena cava is normal in size with greater than 50% respiratory variability, suggesting right atrial pressure of 3 mmHg. IAS/Shunts: No atrial level shunt detected by color flow Doppler.  LEFT VENTRICLE PLAX 2D LVIDd:         3.70 cm   Diastology LVIDs:         2.20 cm   LV e' medial:    6.84 cm/s LV PW:         1.20 cm   LV E/e' medial:  10.2 LV IVS:        1.20 cm   LV e' lateral:   10.60 cm/s LVOT diam:     2.00 cm   LV E/e' lateral: 6.6 LV SV:         87 LV SV Index:   45 LVOT Area:     3.14 cm  RIGHT VENTRICLE RV S prime:     10.90 cm/s TAPSE (M-mode): 2.4 cm LEFT ATRIUM             Index LA Vol (A2C):   49.4 ml 25.60 ml/m LA Vol (A4C):   43.5 ml 22.54 ml/m LA Biplane Vol: 47.1 ml 24.41 ml/m  AORTIC VALVE AV Area (Vmax):    2.13 cm AV Area (Vmean):   2.25 cm AV Area (VTI):     2.36 cm AV Vmax:           152.00 cm/s AV Vmean:          115.000 cm/s AV VTI:            0.368 m AV Peak Grad:      9.2 mmHg AV Mean Grad:      6.0 mmHg LVOT Vmax:         103.00 cm/s LVOT Vmean:        82.200 cm/s LVOT VTI:          0.277 m LVOT/AV VTI ratio: 0.75  AORTA Ao  Root diam: 3.20 cm Ao Asc diam:  3.40 cm MITRAL VALVE                TRICUSPID VALVE MV Area (PHT): 2.16 cm     TR Peak grad:   21.9 mmHg MV Decel Time: 352 msec     TR Vmax:        234.00 cm/s MV E velocity: 69.50 cm/s MV A velocity: 115.00 cm/s  SHUNTS MV E/A ratio:  0.60         Systemic VTI:  0.28 m                             Systemic Diam: 2.00 cm Chilton Si MD Electronically signed by Chilton Si MD Signature Date/Time: 09/29/2022/10:42:55 AM    Final    DG Chest Port 1  View  Result Date: 09/28/2022 CLINICAL DATA:  Shortness of breath EXAM: PORTABLE CHEST 1 VIEW COMPARISON:  01/10/2022 FINDINGS: The heart size and mediastinal contours are within normal limits. Both lungs are clear. No consolidation, pneumothorax, effusion or edema. The visualized skeletal structures are unremarkable. Overlapping cardiac leads. Presumed loop recorder along the lower left hemithorax, new from previous. IMPRESSION: No acute cardiopulmonary disease. Electronically Signed   By: Karen Kays M.D.   On: 09/28/2022 14:01     Assessment and Plan:   Paroxysmal atrial fibrillation with RVR Sinus bradycardia -Noted from 1030pm on 09/29/2022 to 0819am on 09/30/22, spontaneously converted to sinus bradycardia after receiving IV Cardizem and metoprolol overnight, now sinus at 80s - Unclear if SOB episode are due to A fib, she was in A fib RVR overnight and did not have symptoms, ILP no A fib in the past  - TSH WNL,on antibiotic for UTI, AKI improving  - Echo with LVEF 70 to 75%, no RWMA, mild concentric LVH, grade 1 DD, normal RV, no significant valvular disease - Recommend start PO metoprolol XL 25mg  daily for rate control; start PO Eliquis (renal dose) when ready for discharge   Acute hypoxia of unclear etiology Acute kidney injury Presumed UTI Anemia of chronic disease Type 2 diabetes History of cryptogenic stroke - per primary team      Risk Assessment/Risk Scores:   CHA2DS2-VASc Score = 8  his indicates a 10.8% annual risk of stroke. The patient's score is based upon: CHF History: 0 HTN History: 1 Diabetes History: 1 Stroke History: 2 Vascular Disease History: 1 Age Score: 2 Gender Score: 1        For questions or updates, please contact [facility] Please consult [email or web address] for contact info under    Signed, Cyndi Bender, NP  09/30/2022 12:46 PM   History and all data above reviewed.  Patient examined.  I agree with the findings as above.  The nursing  note from the event this morning demonstrated that the patient was having difficulty breathing.  She was on 2 L.  She had increased respiratory effort.  Her saturations were 100%. She was in sinus.  I spoke with the nurse and they did not see distress and could not find objective reasons for this event. I also reviewed the EMS run sheet.  Her saturations below with her were in the 90s except for 1 reading of 76.  She was apparently clammy and diaphoretic.   Her husband had driven her to the EMS by their house.  She is very vague about the symptoms and says "I just did not  feel right."  Here in the ED.  She was saturating fine and was transitioned quickly from nonrebreather to nasal cannula.  She was hypotensive however.  She required IV fluids.  Her rhythm has been sinus.  He had AKI.  Chest x-ray has had no acute pulmonary disease.  He is not found to have atrial fibrillation paroxysms but he is not correlating with any of her symptoms.  Her events at home did not correlate with atrial fibrillation and she has prior to this did not demonstrate any arrhythmia.  She did have transient flutter then bradycardia when her rhythm broke.  The patient exam reveals COR:RRR  ,  Lungs: Clear  ,  Abd: Positive bowel sounds, no rebound no guarding, Ext No edema   .  All available labs, radiology testing, previous records reviewed. Agree with documented assessment and plan.  New onset atrial fibrillation: At this point this is the only objective finding.  Agree with anticoagulation.  I think she will tolerate a low dose of beta blocker.  We can go back and look at her implanted monitor result from home to make sure there were no arrhythmias at the time of her event at home.  Respiratory distress: Etiology of this is not clear.  There was apparent decreased sats at home but no objective evidence to explain why and it seemed to be transient and resolved quickly and spontaneously.  No clear etiology identified.  No pulmonary edema.   It did not have any correlation with the arrhythmia.   I do not think this is pulmonary embolism embolism but I will check a D-dimer .  Regardless she will be on anticoagulation as above for the arrhythmia.   No evidence of ischemia.  However, we could also consider an ischemia workup eventually.  She cannot have contrast at this point and we could consider PET as an outpatient.     Fayrene Fearing Kalisha Keadle  2:02 PM  09/30/2022

## 2022-09-30 NOTE — Progress Notes (Signed)
RT called to patient room for patient having difficulty breathing.  Upon arrival, patient noted to have increased respiratory effort.  Patient's sats were 100% on 2L [place] and clear to auscultation.  Chest xray ordered and resulted.  One time Duoneb treatment given with no change in patient status.  Patient had received medication for anxiety.  MD present to patient room.  Rapid response RN called by bedside RN.  Will continue to monitor.

## 2022-09-30 NOTE — Progress Notes (Signed)
Approximately 0845--This RN messaged MD Kathlen Mody regarding pt. Pt appears tachypneic with increased work of breathing. RR currently 26-28. HR also converted from Afib in the 100s this AM to SB 47-53bpm. PRN Hydroxyzine given approximately 1 hour prior with no notable changes.  MD assessed pt at bedside. Orders placed. Pt was a Yellow MEWS, but now RED. MD aware. Telemetry notified. Respiratory, Charge RN, and Rapid Response notified as well. RN to continue to monitor.

## 2022-09-30 NOTE — Significant Event (Signed)
Rapid Response Event Note   Reason for Call :  Pt with tachypnea and Red MEWS  Initial Focused Assessment:  On arrival, pt sitting in bed with family at bedside. She is A&O and follows commands. Lungs clear/diminished in bases. Pt c/o feeling sob and stating "my feet are cold".  VS: HR 73- SR, BP 110/55, RR 28, spO2 100% on 2L [place].   O2 requirements have not increased  Interventions:  CXR- PTA EKG- PTA Troponin 0.5mg  Ativan  Plan of Care:  Continue to monitor respiratory status. Instructed RN to give Ativan and see if that helps. Awaiting cards consult.  RN instructed to call with any changes or concerns.    Event Summary:   MD Notified: PTA Call Time: 1024 End Time: 1055  Mordecai Rasmussen, RN

## 2022-09-30 NOTE — Progress Notes (Signed)
ANTICOAGULATION CONSULT NOTE - Initial Consult  Pharmacy Consult for Heparin  Indication: atrial fibrillation  Allergies  Allergen Reactions   Lipitor [Atorvastatin] Other (See Comments)    Made mouth very dry and the lips discolored   Shellfish-Derived Products Other (See Comments)    Hands break out   Latex Rash    Patient Measurements: Height: 5\' 5"  (165.1 cm) Weight: 86.2 kg (190 lb) IBW/kg (Calculated) : 57  Vital Signs: Temp: 96.6 F (35.9 C) (06/01 1209) Temp Source: Axillary (06/01 1209) BP: 131/76 (06/01 1209) Pulse Rate: 94 (06/01 1209)  Labs: Recent Labs    09/28/22 1315 09/28/22 1521 09/29/22 0019 09/30/22 0050 09/30/22 1044 09/30/22 1048 09/30/22 1232  HGB 11.2*  --  9.7* 10.5*  --   --   --   HCT 34.8*  --  28.8* 31.1*  --   --   --   PLT 176  --  149* 161  --   --   --   HEPARINUNFRC  --   --   --   --  0.45  --  0.47  CREATININE 2.80*  --  2.59* 2.27*  --   --   --   TROPONINIHS 6 6  --   --   --  9  --      Estimated Creatinine Clearance: 22.9 mL/min (A) (by C-G formula based on SCr of 2.27 mg/dL (H)).   Medical History: Past Medical History:  Diagnosis Date   Diabetes mellitus without complication (HCC)    Glaucoma    Hyperlipidemia    Hypertension    Stroke [facility])    Assessment: 77 y/o F with nest onset afib overnight, starting heparin per pharmacy, Hgb 10.5, noted renal dysfunction, PTA meds reviewed. Patient was on enoxaparin for VTE prophylaxis, this has been discontinued.   Heparin level therapeutic at 0.47. Hgb 10.5, PLT 161 stable. No issues with heparin infusion or s/sx bleeding noted.   Goal of Therapy:  Heparin level 0.3-0.7 units/ml Monitor platelets by anticoagulation protocol: Yes   Plan:  Continue heparin infusion at 900 units/hr Daily CBC/heparin level Monitor for bleeding  Larena Sox, PharmD PGY1 Pharmacy Resident   09/30/2022  1:08 PM

## 2022-09-30 NOTE — Progress Notes (Signed)
ANTICOAGULATION CONSULT NOTE - Initial Consult  Pharmacy Consult for Heparin  Indication: atrial fibrillation  Allergies  Allergen Reactions   Lipitor [Atorvastatin] Other (See Comments)    Made mouth very dry and the lips discolored   Shellfish-Derived Products Other (See Comments)    Hands break out   Latex Rash    Patient Measurements: Height: 5\' 5"  (165.1 cm) Weight: 85.6 kg (188 lb 11.4 oz) IBW/kg (Calculated) : 57  Vital Signs: Temp: 97.4 F (36.3 C) (06/01 0300) Temp Source: Oral (06/01 0300) BP: 114/68 (06/01 0300) Pulse Rate: 112 (06/01 0300)  Labs: Recent Labs    09/28/22 1315 09/28/22 1521 09/29/22 0019 09/30/22 0050  HGB 11.2*  --  9.7* 10.5*  HCT 34.8*  --  28.8* 31.1*  PLT 176  --  149* 161  CREATININE 2.80*  --  2.59* 2.27*  TROPONINIHS 6 6  --   --     Estimated Creatinine Clearance: 22.8 mL/min (A) (by C-G formula based on SCr of 2.27 mg/dL (H)).   Medical History: Past Medical History:  Diagnosis Date   Diabetes mellitus without complication (HCC)    Glaucoma    Hyperlipidemia    Hypertension    Stroke [facility])    Assessment: 77 y/o F with nest onset afib overnight, starting heparin per pharmacy, Hgb 10.5, noted renal dysfunction, PTA meds reviewed.   Goal of Therapy:  Heparin level 0.3-0.7 units/ml Monitor platelets by anticoagulation protocol: Yes   Plan:  DC Lovenox Start heparin drip at 900 units/hr 1200 Heparin level Daily CBC/Heparin level Monitor for bleeding  Abran Duke, PharmD, BCPS Clinical Pharmacist Phone: [phone]

## 2022-09-30 NOTE — Progress Notes (Signed)
Red MEWS    09/30/22 1008  Assess: MEWS Score  Temp (!) 96.6 F (35.9 C)  BP (!) 99/53  MAP (mmHg) (!) 63  Pulse Rate 67  ECG Heart Rate 67  Resp (!) 26  Level of Consciousness Alert  SpO2 100 %  O2 Device Nasal Cannula  O2 Flow Rate (L/min) 2 L/min  Assess: MEWS Score  MEWS Temp 1  MEWS Systolic 1  MEWS Pulse 0  MEWS RR 2  MEWS LOC 0  MEWS Score 4  MEWS Score Color Red  Assess: if the MEWS score is Yellow or Red  Were vital signs taken at a resting state? Yes  Focused Assessment Change from prior assessment (see assessment flowsheet)  Does the patient meet 2 or more of the SIRS criteria? No  Does the patient have a confirmed or suspected source of infection? No  MEWS guidelines implemented  Yes, red  Treat  MEWS Interventions Considered administering scheduled or prn medications/treatments as ordered  Take Vital Signs  Increase Vital Sign Frequency  Red: Q1hr x2, continue Q4hrs until patient remains green for 12hrs  Escalate  MEWS: Escalate Red: Discuss with charge nurse and notify provider. Consider notifying RRT. If remains red for 2 hours consider need for higher level of care  Notify: Charge Nurse/RN  Name of Charge Nurse/RN Notified Trey Paula, RN  Provider Notification  Provider Name/Title Blake Divine MD  Date Provider Notified 09/30/22  Time Provider Notified 1008  Method of Notification Face-to-face  Notification Reason Change in status;Other (Comment) (Red MEWS)  Provider response At bedside  Date of Provider Response 09/30/22  Time of Provider Response 1008  Notify: Rapid Response  Date Rapid Response Notified 09/30/22  Time Rapid Response Notified 1015  Assess: SIRS CRITERIA  SIRS Temperature  1  SIRS Pulse 0  SIRS Respirations  1  SIRS WBC 0  SIRS Score Sum  2

## 2022-09-30 NOTE — Progress Notes (Signed)
Yellow MEWS  09/30/22 0753  Assess: MEWS Score  Temp (!) 97.1 F (36.2 C)  BP 115/89  MAP (mmHg) 96  Pulse Rate 87  ECG Heart Rate (!) 113  Resp 18  Level of Consciousness Alert  SpO2 95 %  O2 Device Room Air  Assess: MEWS Score  MEWS Temp 0  MEWS Systolic 0  MEWS Pulse 2  MEWS RR 0  MEWS LOC 0  MEWS Score 2  MEWS Score Color Yellow  Assess: if the MEWS score is Yellow or Red  Were vital signs taken at a resting state? Yes  Focused Assessment No change from prior assessment  Does the patient meet 2 or more of the SIRS criteria? No  Does the patient have a confirmed or suspected source of infection? Yes  MEWS guidelines implemented  Yes, yellow  Treat  MEWS Interventions Considered administering scheduled or prn medications/treatments as ordered  Take Vital Signs  Increase Vital Sign Frequency  Yellow: Q2hr x1, continue Q4hrs until patient remains green for 12hrs  Escalate  MEWS: Escalate Yellow: Discuss with charge nurse and consider notifying provider and/or RRT  Notify: Charge Nurse/RN  Name of Charge Nurse/RN Guadalupe Dawn, RN  Provider Notification  Provider Name/Title Blake Divine MD  Date Provider Notified 09/30/22  Time Provider Notified [phone]  Method of Notification Page  Notification Reason Other (Comment) (Yellow MEWS d/t HR being in the low 100s afib. Now HR 52-53bpm SB)  Provider response See new orders  Date of Provider Response 09/30/22  Time of Provider Response 0845  Assess: SIRS CRITERIA  SIRS Temperature  0  SIRS Pulse 1  SIRS Respirations  0  SIRS WBC 0  SIRS Score Sum  1

## 2022-09-30 NOTE — Progress Notes (Signed)
e        [facility]                                                                               Heather Gross, is a 77 y.o. female, DOB - 11-Dec-1945, ZOX:[id number] Admit date - 09/28/2022    Outpatient Primary MD for the patient is Loura Back, NP  LOS - 1  days    Brief summary   Heather Gross is a 77 y.o. female with medical history significant of hypertension, CVA with no residual deficits, hyperlipidemia, reports having sob intermittently over the last few weeks worsened and required admission to the hospital. She was found to be in AKI suspect from diuretic use. She was admitted for management and evaluation of AKI.    Assessment & Plan    Assessment and Plan:  Acute respiratory failure with hypoxia: unclear etiology.  Acs Ruled out. Patient had another episode of dyspnea with tachycardia, tachypnea, .   Repeat CXR is negative.  She is on RA and her sats are 100%  BNP and d dimer are negative.  Echocardiogram is unremarkable.     Atrial fib with RVR overnight.  Rate better with a dose of cardizem and metoprolol .  Currently on IV heparin for anti coagulation.  Cardiology consulted to see if her symptoms of SOB are related to the afib.     Acute Kidney Injury with anion gap metabolic acidosis:  Suspect from diuretic use. Hold losartan and hydrochlorothiazide.  Slowly improving.  Baseline creatinine about 1.8, admitted with a creatinine of 2.8 improved to 2.27.  US renal does not show hydronephrosis. Urine sodium ordered.  Gently hydrated and repeat renal parameters show improvement.  Bicarb improved to 20, gap closed.   H/o CVA On plavix, zetia and crestor.    Anxiety attacks? On hydroxyzine.    Hypokalemia:  Replaced.  Magnesium is wnl.   Normocytic anemia Hemoglobin this morning around 10. No obvious signs of bleeding.    Mild thrombocytopenia;  Monitor.    UTI;  UA shows large leukocytosis and few bacteria.  Urine cultures ordered,  empirically started on IV ceftriaxone.     SIRS;  Resolved.  Blood cultures are pending and negative so far. .    Type 2 DM:  CBG (last 3)  Recent Labs    09/29/22 1115 09/29/22 2115 09/30/22 0605  GLUCAP 101* 103* 128*    Resume SSI. Holding metformin for acidosis.   Estimated body mass index is 31.62 kg/m as calculated from the following:   Height as of this encounter: 5\' 5"  (1.651 m).   Weight as of this encounter: 86.2 kg.  Code Status: full code.  DVT Prophylaxis:  heparin.    Level of Care: Level of care: Telemetry Cardiac Family Communication: none at bedside.   Disposition Plan:     Remains inpatient appropriate:  pending improvement of renal parameters.   Procedures:  Echocardiogram.   Consultants:   Cardiology.   Antimicrobials:   Anti-infectives (From admission, onward)    Start     Dose/Rate Route Frequency Ordered Stop   09/30/22 1000  cefTRIAXone (ROCEPHIN) 1 g in sodium chloride 0.9 % 100 mL  IVPB        1 g 200 mL/hr over 30 Minutes Intravenous Every 24 hours 09/29/22 1436          Medications  Scheduled Meds:  clopidogrel  75 mg Oral Daily   ezetimibe  10 mg Oral Daily   insulin aspart  0-9 Units Subcutaneous TID WC   metoprolol tartrate  25 mg Oral BID   rosuvastatin  40 mg Oral QHS   Continuous Infusions:  cefTRIAXone (ROCEPHIN)  IV 1 g (09/30/22 1126)   heparin 900 Units/hr (09/30/22 0440)   PRN Meds:.fluticasone, hydrOXYzine, LORazepam, pantoprazole    Subjective:   Heather Gross was seen and examined today.  An episode of not feeling well.   Objective:   Vitals:   09/30/22 0844 09/30/22 1008 09/30/22 1013 09/30/22 1209  BP:  (!) 99/53  131/76  Pulse: (!) 56 67  94  Resp:  (!) 26  20  Temp:  (!) 96.6 F (35.9 C)  (!) 96.6 F (35.9 C)  TempSrc:  Axillary  Axillary  SpO2:  100% 100% 100%  Weight:      Height:        Intake/Output Summary (Last 24 hours) at 09/30/2022 1312 Last data filed at 09/30/2022  0616 Gross per 24 hour  Intake 1800.62 ml  Output 200 ml  Net 1600.62 ml    Filed Weights   09/28/22 1643 09/30/22 0638  Weight: 85.6 kg 86.2 kg     Exam General exam: Appears calm and comfortable  Respiratory system: clear to auscultation. No wheezing or rhonchi.  Cardiovascular system: S1 & S2 heard, bradycardic.  Gastrointestinal system: Abdomen is nondistended, soft and nontender.  Central nervous system: Alert and oriented. No focal neurological deficits. Extremities: Symmetric 5 x 5 power. Skin: No rashes,  Psychiatry: anxious.     Data Reviewed:  I have personally reviewed following labs and imaging studies   CBC Lab Results  Component Value Date   WBC 7.3 09/30/2022   RBC 3.33 (L) 09/30/2022   HGB 10.5 (L) 09/30/2022   HCT 31.1 (L) 09/30/2022   MCV 93.4 09/30/2022   MCH 31.5 09/30/2022   PLT 161 09/30/2022   MCHC 33.8 09/30/2022   RDW 12.5 09/30/2022   LYMPHSABS 2.9 09/30/2022   MONOABS 0.5 09/30/2022   EOSABS 0.1 09/30/2022   BASOSABS 0.0 09/30/2022     Last metabolic panel Lab Results  Component Value Date   NA 140 09/30/2022   K 4.2 09/30/2022   CL 108 09/30/2022   CO2 20 (L) 09/30/2022   BUN 30 (H) 09/30/2022   CREATININE 2.27 (H) 09/30/2022   GLUCOSE 110 (H) 09/30/2022   GFRNONAA 22 (L) 09/30/2022   GFRAA 56 (L) 12/30/2019   CALCIUM 9.3 09/30/2022   PROT 7.1 09/28/2022   ALBUMIN 4.1 09/28/2022   BILITOT 0.6 09/28/2022   ALKPHOS 42 09/28/2022   AST 24 09/28/2022   ALT 18 09/28/2022   ANIONGAP 12 09/30/2022    CBG (last 3)  Recent Labs    09/29/22 1115 09/29/22 2115 09/30/22 0605  GLUCAP 101* 103* 128*       Coagulation Profile: No results for input(s): "INR", "PROTIME" in the last 168 hours.   Radiology Studies: DG CHEST PORT 1 VIEW  Result Date: 09/30/2022 CLINICAL DATA:  [id number] Dyspnea [id number] EXAM: PORTABLE CHEST - 1 VIEW COMPARISON:  09/28/2022 FINDINGS: Lungs are clear. Heart size and mediastinal contours are within  normal limits. Aortic Atherosclerosis (ICD10-170.0). Event monitor overlies the left lower  chest. No effusion. Visualized bones unremarkable. IMPRESSION: No acute cardiopulmonary disease. Electronically Signed   By: Corlis Leak M.D.   On: 09/30/2022 10:04   US RENAL  Result Date: 09/29/2022 CLINICAL DATA:  [id number] AKI (acute kidney injury) (HCC) [id number] EXAM: RENAL / URINARY TRACT ULTRASOUND COMPLETE COMPARISON:  Renal ultrasound 07/20/2022 FINDINGS: Right Kidney: Renal measurements: 9.8 x 4.1 x 5.2 cm = volume: 109.5 mL. Minimal pelvic fullness. Normal echogenicity. Left Kidney: Renal measurements: 11.1 x 4.9 x 5.9 cm = volume: 169.2 mL. No hydronephrosis. Normal echogenicity. Bladder: Appears normal for degree of bladder distention. Other: None. IMPRESSION: Minimal right renal pelvic fullness.  No left renal hydronephrosis. Electronically Signed   By: Caprice Renshaw M.D.   On: 09/29/2022 13:32   ECHOCARDIOGRAM COMPLETE  Result Date: 09/29/2022    ECHOCARDIOGRAM REPORT   Patient Name:   SIMA GENNETTE Date of Exam: 09/29/2022 Medical Rec #:  [id number]      Height:       65.0 in Accession #:    [id number]     Weight:       188.7 lb Date of Birth:  05/27/1945       BSA:          1.930 m Patient Age:    77 years       BP:           106/53 mmHg Patient Gender: F              HR:           68 bpm. Exam Location:  Inpatient Procedure: 2D Echo, Cardiac Doppler, Color Doppler and Intracardiac            Opacification Agent Indications:    Dyspnea  History:        Patient has no prior history of Echocardiogram examinations.                 Risk Factors:Hypertension, Diabetes and HLD.  Sonographer:    Lucy Antigua Referring Phys: Herminio Heads IMPRESSIONS  1. Intracavitary gradient. Peak velocity 1.93 m/s. Peak gradient 14.9 mmHg. Left ventricular ejection fraction, by estimation, is 70 to 75%. The left ventricle has hyperdynamic function. The left ventricle has no regional wall motion abnormalities. There is mild  concentric left ventricular hypertrophy. Left ventricular diastolic parameters are consistent with Grade I diastolic dysfunction (impaired relaxation).  2. Right ventricular systolic function is normal. The right ventricular size is normal. There is normal pulmonary artery systolic pressure.  3. The mitral valve is normal in structure. No evidence of mitral valve regurgitation. No evidence of mitral stenosis.  4. The aortic valve is tricuspid. Aortic valve regurgitation is not visualized. No aortic stenosis is present.  5. The inferior vena cava is normal in size with greater than 50% respiratory variability, suggesting right atrial pressure of 3 mmHg. FINDINGS  Left Ventricle: Intracavitary gradient. Peak velocity 1.93 m/s. Peak gradient 14.9 mmHg. Left ventricular ejection fraction, by estimation, is 70 to 75%. The left ventricle has hyperdynamic function. The left ventricle has no regional wall motion abnormalities. The left ventricular internal cavity size was normal in size. There is mild concentric left ventricular hypertrophy. Left ventricular diastolic parameters are consistent with Grade I diastolic dysfunction (impaired relaxation). Indeterminate filling pressures. Right Ventricle: The right ventricular size is normal. No increase in right ventricular wall thickness. Right ventricular systolic function is normal. There is normal pulmonary artery systolic pressure. The tricuspid regurgitant velocity is 2.34 m/s, and  with an assumed right atrial pressure of 3 mmHg, the estimated right ventricular systolic pressure is 24.9 mmHg. Left Atrium: Left atrial size was normal in size. Right Atrium: Right atrial size was normal in size. Pericardium: There is no evidence of pericardial effusion. Mitral Valve: The mitral valve is normal in structure. No evidence of mitral valve regurgitation. No evidence of mitral valve stenosis. Tricuspid Valve: The tricuspid valve is normal in structure. Tricuspid valve  regurgitation is trivial. No evidence of tricuspid stenosis. Aortic Valve: The aortic valve is tricuspid. Aortic valve regurgitation is not visualized. No aortic stenosis is present. Aortic valve mean gradient measures 6.0 mmHg. Aortic valve peak gradient measures 9.2 mmHg. Aortic valve area, by VTI measures 2.36 cm. Pulmonic Valve: The pulmonic valve was normal in structure. Pulmonic valve regurgitation is not visualized. No evidence of pulmonic stenosis. Aorta: The aortic root is normal in size and structure. Venous: The inferior vena cava is normal in size with greater than 50% respiratory variability, suggesting right atrial pressure of 3 mmHg. IAS/Shunts: No atrial level shunt detected by color flow Doppler.  LEFT VENTRICLE PLAX 2D LVIDd:         3.70 cm   Diastology LVIDs:         2.20 cm   LV e' medial:    6.84 cm/s LV PW:         1.20 cm   LV E/e' medial:  10.2 LV IVS:        1.20 cm   LV e' lateral:   10.60 cm/s LVOT diam:     2.00 cm   LV E/e' lateral: 6.6 LV SV:         87 LV SV Index:   45 LVOT Area:     3.14 cm  RIGHT VENTRICLE RV S prime:     10.90 cm/s TAPSE (M-mode): 2.4 cm LEFT ATRIUM             Index LA Vol (A2C):   49.4 ml 25.60 ml/m LA Vol (A4C):   43.5 ml 22.54 ml/m LA Biplane Vol: 47.1 ml 24.41 ml/m  AORTIC VALVE AV Area (Vmax):    2.13 cm AV Area (Vmean):   2.25 cm AV Area (VTI):     2.36 cm AV Vmax:           152.00 cm/s AV Vmean:          115.000 cm/s AV VTI:            0.368 m AV Peak Grad:      9.2 mmHg AV Mean Grad:      6.0 mmHg LVOT Vmax:         103.00 cm/s LVOT Vmean:        82.200 cm/s LVOT VTI:          0.277 m LVOT/AV VTI ratio: 0.75  AORTA Ao Root diam: 3.20 cm Ao Asc diam:  3.40 cm MITRAL VALVE                TRICUSPID VALVE MV Area (PHT): 2.16 cm     TR Peak grad:   21.9 mmHg MV Decel Time: 352 msec     TR Vmax:        234.00 cm/s MV E velocity: 69.50 cm/s MV A velocity: 115.00 cm/s  SHUNTS MV E/A ratio:  0.60         Systemic VTI:  0.28 m  Systemic Diam: 2.00 cm Chilton Si MD Electronically signed by Chilton Si MD Signature Date/Time: 09/29/2022/10:42:55 AM    Final    DG Chest Port 1 View  Result Date: 09/28/2022 CLINICAL DATA:  Shortness of breath EXAM: PORTABLE CHEST 1 VIEW COMPARISON:  01/10/2022 FINDINGS: The heart size and mediastinal contours are within normal limits. Both lungs are clear. No consolidation, pneumothorax, effusion or edema. The visualized skeletal structures are unremarkable. Overlapping cardiac leads. Presumed loop recorder along the lower left hemithorax, new from previous. IMPRESSION: No acute cardiopulmonary disease. Electronically Signed   By: Karen Kays M.D.   On: 09/28/2022 14:01       Kathlen Mody M.D. [facility] 09/30/2022, 1:12 PM  Available via Epic secure chat 7am-7pm After 7 pm, please refer to night coverage provider listed on amion.

## 2022-09-30 NOTE — Plan of Care (Signed)

## 2022-10-01 DIAGNOSIS — I480 Paroxysmal atrial fibrillation: Secondary | ICD-10-CM | POA: Diagnosis not present

## 2022-10-01 DIAGNOSIS — N179 Acute kidney failure, unspecified: Secondary | ICD-10-CM | POA: Diagnosis not present

## 2022-10-01 DIAGNOSIS — K219 Gastro-esophageal reflux disease without esophagitis: Secondary | ICD-10-CM | POA: Diagnosis not present

## 2022-10-01 DIAGNOSIS — I10 Essential (primary) hypertension: Secondary | ICD-10-CM | POA: Diagnosis not present

## 2022-10-01 LAB — BASIC METABOLIC PANEL
Anion gap: 9 (ref 5–15)
BUN: 24 mg/dL — ABNORMAL HIGH (ref 8–23)
CO2: 20 mmol/L — ABNORMAL LOW (ref 22–32)
Calcium: 9 mg/dL (ref 8.9–10.3)
Chloride: 109 mmol/L (ref 98–111)
Creatinine, Ser: 2.17 mg/dL — ABNORMAL HIGH (ref 0.44–1.00)
GFR, Estimated: 23 mL/min — ABNORMAL LOW (ref >60)
Glucose, Bld: 124 mg/dL — ABNORMAL HIGH (ref 70–99)
Potassium: 3.7 mmol/L (ref 3.5–5.1)
Sodium: 138 mmol/L (ref 135–145)

## 2022-10-01 LAB — GLUCOSE, CAPILLARY
Glucose-Capillary: 116 mg/dL — ABNORMAL HIGH (ref 70–99)
Glucose-Capillary: 111 mg/dL — ABNORMAL HIGH (ref 70–99)
Glucose-Capillary: 98 mg/dL (ref 70–99)

## 2022-10-01 LAB — CBC
HCT: 27.7 % — ABNORMAL LOW (ref 36.0–46.0)
Hemoglobin: 9.3 g/dL — ABNORMAL LOW (ref 12.0–15.0)
MCH: 31.2 pg (ref 26.0–34.0)
MCHC: 33.6 g/dL (ref 30.0–36.0)
MCV: 93 fL (ref 80.0–100.0)
Platelets: 131 10*3/uL — ABNORMAL LOW (ref 150–400)
RBC: 2.98 MIL/uL — ABNORMAL LOW (ref 3.87–5.11)
RDW: 12.7 % (ref 11.5–15.5)
WBC: 6.7 10*3/uL (ref 4.0–10.5)
nRBC: 0 % (ref 0.0–0.2)

## 2022-10-01 LAB — URINE CULTURE: Culture: >=100000 — AB

## 2022-10-01 LAB — CULTURE, BLOOD (ROUTINE X 2): Culture: NO GROWTH

## 2022-10-01 LAB — HEPARIN LEVEL (UNFRACTIONATED): Heparin Unfractionated: 0.66 IU/mL (ref 0.30–0.70)

## 2022-10-01 MED ORDER — AMOXICILLIN 500 MG PO CAPS: 500.0000 mg | ORAL_CAPSULE | Freq: Two times a day (BID) | ORAL | 0 refills | Status: AC

## 2022-10-01 MED ORDER — SODIUM BICARBONATE 650 MG PO TABS: 650.0000 mg | ORAL_TABLET | Freq: Two times a day (BID) | ORAL | 1 refills | Status: AC

## 2022-10-01 MED ORDER — METOPROLOL SUCCINATE ER 25 MG PO TB24: 25.0000 mg | ORAL_TABLET | Freq: Every day | ORAL | 2 refills | Status: DC

## 2022-10-01 MED ORDER — APIXABAN 5 MG PO TABS: 5.0000 mg | ORAL_TABLET | Freq: Two times a day (BID) | ORAL | 2 refills | Status: DC

## 2022-10-01 MED ORDER — HYDROXYZINE HCL 10 MG PO TABS: 10.0000 mg | ORAL_TABLET | Freq: Three times a day (TID) | ORAL | 0 refills | Status: AC | PRN

## 2022-10-01 MED ORDER — AMOXICILLIN 500 MG PO CAPS
500.0000 mg | ORAL_CAPSULE | Freq: Two times a day (BID) | ORAL | Status: DC
  Administered 2022-10-01: 500 mg via ORAL
  Filled 2022-10-01: qty 1

## 2022-10-01 MED ORDER — APIXABAN 5 MG PO TABS
5.0000 mg | ORAL_TABLET | Freq: Two times a day (BID) | ORAL | Status: DC
  Administered 2022-10-01: 5 mg via ORAL
  Filled 2022-10-01: qty 1

## 2022-10-01 NOTE — Plan of Care (Signed)

## 2022-10-01 NOTE — Progress Notes (Addendum)
ANTICOAGULATION CONSULT NOTE - Initial Consult  Pharmacy Consult for Heparin  Indication: atrial fibrillation  Allergies  Allergen Reactions   Lipitor [Atorvastatin] Other (See Comments)    Made mouth very dry and the lips discolored   Shellfish-Derived Products Other (See Comments)    Hands break out   Latex Rash    Patient Measurements: Height: 5\' 5"  (165.1 cm) Weight: 85.7 kg (188 lb 14.4 oz) IBW/kg (Calculated) : 57  Vital Signs: Temp: 97.5 F (36.4 C) (06/02 0910) Temp Source: Axillary (06/02 0910) BP: 113/69 (06/02 0910) Pulse Rate: 71 (06/02 0910)  Labs: Recent Labs    09/28/22 1315 09/28/22 1521 09/29/22 0019 09/30/22 0050 09/30/22 1044 09/30/22 1048 09/30/22 1232 10/01/22 0738  HGB 11.2*  --  9.7* 10.5*  --   --   --  9.3*  HCT 34.8*  --  28.8* 31.1*  --   --   --  27.7*  PLT 176  --  149* 161  --   --   --  131*  HEPARINUNFRC  --   --   --   --  0.45  --  0.47 0.66  CREATININE 2.80*  --  2.59* 2.27*  --   --   --   --   TROPONINIHS 6 6  --   --   --  9 10  --      Estimated Creatinine Clearance: 22.8 mL/min (A) (by C-G formula based on SCr of 2.27 mg/dL (H)).   Medical History: Past Medical History:  Diagnosis Date   Diabetes mellitus without complication (HCC)    Glaucoma    Hyperlipidemia    Hypertension    Stroke [facility])    Assessment: 77 y/o F with nest onset afib overnight, starting heparin per pharmacy, Hgb 10.5, noted renal dysfunction, PTA meds reviewed. Patient was on enoxaparin for VTE prophylaxis, this has been discontinued. Patient is continued on clopidogrel from PTA.   Daily heparin level therapeutic at 0.66 on 900 units/hr. Hgb 9.3, PLT 131. No issues with heparin infusion or s/sx bleeding per RN and RN will continue to monitor closely for bleeding.   Goal of Therapy:  Heparin level 0.3-0.7 units/ml Monitor platelets by anticoagulation protocol: Yes   Plan:  Continue heparin infusion at 900 units/hr Daily CBC/heparin  level Monitor for bleeding   Larena Sox, PharmD PGY1 Pharmacy Resident   10/01/2022  9:12 AM   ADDENDUM   Pharmacy consulted to transition heparin to Eliquis for atrial fibrillation. Pt 77, weight 85.7 kg, Scr 2.17.   Plan:  Stop heparin infusion Start Eliquis 5 mg PO BID  Monitor for s/sx bleeding Pharmacy will sign off  Larena Sox, PharmD PGY1 Pharmacy Resident   10/01/2022  12:05 PM

## 2022-10-01 NOTE — TOC Transition Note (Signed)
Transition of Care [facility]) - CM/SW Discharge Note   Patient Details  Name: Heather Gross MRN: [id number] Date of Birth: Jun 15, 1945  Transition of Care [facility]) CM/SW Contact:  Tom-Mccaslin, Hershal Coria, RN Phone Number: 10/01/2022, 1:25 PM   Clinical Narrative:     Patient is scheduled for discharge today.  Readmission Risk Assessment done. Outpatient f/u, hospital f/u and discharge instructions on AVS. No PT f/u noted, no TOC needs or recommendations noted. Husband, [name] to transport at discharge.  No further TOC needs noted.        Final next level of care: Home/Self Care Barriers to Discharge: Barriers Resolved   Patient Goals and CMS Choice CMS Medicare.gov Compare Post Acute Care list provided to:: Patient Choice offered to / list presented to : NA  Discharge Placement                  Patient to be transferred to facility by: Husband Name of family member notified: [facility] and Services Additional resources added to the After Visit Summary for                  DME Arranged: N/A DME Agency: NA       HH Arranged: NA HH Agency: NA        Social Determinants of Health (SDOH) Interventions SDOH Screenings   Food Insecurity: No Food Insecurity (09/29/2022)  Housing: Low Risk  (09/29/2022)  Transportation Needs: Unmet Transportation Needs (09/29/2022)  Utilities: Not At Risk (09/29/2022)  Alcohol Screen: Low Risk  (12/30/2019)  Depression (PHQ2-9): Low Risk  (12/30/2019)  Financial Resource Strain: Low Risk  (10/24/2019)  Tobacco Use: Medium Risk (09/29/2022)     Readmission Risk Interventions    10/01/2022    1:24 PM  Readmission Risk Prevention Plan  Post Dischage Appt Complete  Medication Screening Complete  Transportation Screening Complete

## 2022-10-01 NOTE — Progress Notes (Signed)
   Patient Name: Shandrell Travers Date of Encounter: 10/01/2022 [facility] Cardiologist: None   Interval Summary   No acute events overnight. She denies SOB or palpitations.   Vital Signs   Vitals:   09/30/22 1953 09/30/22 2347 10/01/22 0442 10/01/22 0910  BP: (!) 121/50 (!) 113/58 (!) 102/59 113/69  Pulse: 69 76 70 71  Resp: 18 16 18 18   Temp: (!) 97.4 F (36.3 C) (!) 97.4 F (36.3 C) (!) 97.2 F (36.2 C) (!) 97.5 F (36.4 C)  TempSrc: Oral Oral Oral Axillary  SpO2: 96% 98% 100% 97%  Weight:   85.7 kg   Height:        Intake/Output Summary (Last 24 hours) at 10/01/2022 1059 Last data filed at 10/01/2022 1013 Gross per 24 hour  Intake 1654.95 ml  Output 900 ml  Net 754.95 ml      10/01/2022    4:42 AM 09/30/2022    6:38 AM 09/28/2022    4:43 PM  Last 3 Weights  Weight (lbs) 188 lb 14.4 oz 190 lb 188 lb 11.4 oz  Weight (kg) 85.684 kg 86.183 kg 85.6 kg      Telemetry/ECG    NSR - Personally Reviewed  Physical Exam  GEN: No acute distress.   Neck: No JVD Cardiac: RRR, no murmurs, rubs, or gallops.  Respiratory: Clear to auscultation bilaterally. GI: Soft, nontender, non-distended  MS: No edema  Assessment & Plan    PAF:  No paroxysms.  Tolerating low dose beta blocker for rate control with paroxysms.  Now on anticoagulation.  No change in therapy.    ACUTE HYPOXEMIA:  As in consult note no clear etiology.  Documentation of acute O2 desaturations is limited to comment on EMS records.  No objective findings help with this.  D dimer was normal and pretest prob of PE was low.  No further work up.     For questions or updates, please contact [facility] Please consult [email or web address] for contact info under        Signed, Rollene Rotunda, MD

## 2022-10-01 NOTE — Progress Notes (Signed)
Approximately 1530--Pt discharged to home with son and husband. At time of discharge, pt A&O x 4 and VSS. AVS discharge instructions provided to pt with teach-back technique utilized. All patient belongings taken with pt--pt verified. All PIVs removed. Telemetry discontinued and notified.

## 2022-10-01 NOTE — Discharge Instructions (Signed)

## 2022-10-01 NOTE — Progress Notes (Signed)
Follow up arranged on 10/13/22 with cardiology.

## 2022-10-01 NOTE — Discharge Summary (Signed)
Physician Discharge Summary   Patient: Heather Gross MRN: [id number] DOB: [birth year]  Admit date:     09/28/2022  Discharge date: 10/01/22  Discharge Physician: Kathlen Mody   PCP: Loura Back, NP   Recommendations at discharge:  Please follow up with cardiology as outpatient.  Please follow up with nephrology in one week.  You will need outpatient follow up with PCP and repeat BMP in  3 days to check your kidney function.   Discharge Diagnoses: Principal Problem:   AKI (acute kidney injury) (HCC) Active Problems:   Essential hypertension   Gastroesophageal reflux disease   Hyperlipidemia   Type 2 diabetes mellitus with diabetic chronic kidney disease (HCC)   Stage 3a chronic kidney disease (CKD) (HCC) -Baseline Scr 1.3-1.6    Hospital Course: Heather Gross is a 77 y.o. female with medical history significant of hypertension, CVA with no residual deficits, hyperlipidemia, reports having sob intermittently over the last few weeks worsened and required admission to the hospital. She was found to be in AKI suspect from diuretic use. She was admitted for management and evaluation of AKI.    Assessment and Plan:   Acute respiratory failure with hypoxia: unclear etiology.  Acs Ruled out. Patient had another episode of dyspnea with tachycardia, tachypnea, .  Suspect anxiety attacks. Started her on hydroxyzine.  Repeat CXR is negative.  She is on RA and her sats are 100%  BNP and d dimer are negative.  Echocardiogram is unremarkable.        Atrial fib with RVR overnight.  Rate better with a dose of cardizem and metoprolol .  Currently on IV heparin for anti coagulation transitioned to eliquis.  Cardiology consulted to see if her symptoms of SOB are related to the afib.        Acute Kidney Injury with anion gap metabolic acidosis:  Suspect from diuretic use. Hold losartan and hydrochlorothiazide. On discharge.  Slowly improving.   Baseline creatinine about 1.8, admitted  with a creatinine of 2.8 improved to 2.27 to 2.1.  Recommended another 24 hours observation, but patient wanted to go home and follow up with PCP and nephrology as outpatient.  US renal does not show hydronephrosis. Gently hydrated and repeat renal parameters show improvement.  Bicarb improved to 20, gap closed.  Discharged on oral bicarbonate.    H/o CVA On plavix, zetia and crestor.      Anxiety attacks? On hydroxyzine.      Hypokalemia:  Replaced.  Magnesium is wnl.    Normocytic anemia Hemoglobin this morning around 10. No obvious signs of bleeding.      Mild thrombocytopenia;  Monitor.      UTI;  UA shows large leukocytosis and few bacteria.  Urine cultures ordered showed enterococcus and E coli, empirically started on IV ceftriaxone transitioned to amoxicillin on discharge.        SIRS;  Resolved.  Blood cultures are pending and negative so far. .      Type 2 DM:  A1c is 7.3.  Had to hold the metformin for metabolic acidosis.  Recommend outpatient follow up    Estimated body mass index is 31.62 kg/m as calculated from the following:   Height as of this encounter: 5\' 5"  (1.651 m).   Weight as of this encounter: 86.2 kg.      Consultants: cardiology Procedures performed: echo  Disposition: Home Diet recommendation:  Discharge Diet Orders (From admission, onward)     Start     Ordered   10/01/22  0000  Diet - low sodium heart healthy        10/01/22 1314           Carb modified diet DISCHARGE MEDICATION: Allergies as of 10/01/2022       Reactions   Lipitor [atorvastatin] Other (See Comments)   Made mouth very dry and the lips discolored   Shellfish-derived Products Other (See Comments)   Hands break out   Latex Rash        Medication List     STOP taking these medications    metFORMIN 500 MG tablet Commonly known as: GLUCOPHAGE   Olmesartan-amLODIPine-HCTZ 40-5-25 MG Tabs       TAKE these medications    amoxicillin 500 MG  capsule Commonly known as: AMOXIL Take 1 capsule (500 mg total) by mouth every 12 (twelve) hours for 3 days.   apixaban 5 MG Tabs tablet Commonly known as: ELIQUIS Take 1 tablet (5 mg total) by mouth 2 (two) times daily.   Calcium 500 MG Chew Chew 2 tablets (1,000 mg total) by mouth daily.   clopidogrel 75 MG tablet Commonly known as: Plavix Take 1 tablet (75 mg total) by mouth daily. Start on December 11, 2021   [employer] D3 50 MCG (2000 UT) Caps Generic drug: Cholecalciferol TAKE 1 CAPSULE BY MOUTH EVERY DAY What changed:  how much to take when to take this   ezetimibe 10 MG tablet Commonly known as: ZETIA Take 10 mg by mouth daily.   fluticasone 50 MCG/ACT nasal spray Commonly known as: FLONASE Place 1-2 sprays into both nostrils daily as needed for allergies or rhinitis.   hydrOXYzine 10 MG tablet Commonly known as: ATARAX Take 1 tablet (10 mg total) by mouth 3 (three) times daily as needed for anxiety.   metoprolol succinate 25 MG 24 hr tablet Commonly known as: TOPROL-XL Take 1 tablet (25 mg total) by mouth daily. Start taking on: October 02, 2022   multivitamin tablet Take 1 tablet by mouth daily with breakfast.   pantoprazole 20 MG tablet Commonly known as: Protonix Take 1 tablet (20 mg total) by mouth daily.   rosuvastatin 40 MG tablet Commonly known as: CRESTOR Take 40 mg by mouth at bedtime.   sodium bicarbonate 650 MG tablet Take 1 tablet (650 mg total) by mouth 2 (two) times daily.        Follow-up Information     Alver Sorrow, NP Follow up on 10/13/2022.   Specialty: Cardiology Why: at 8:25 am for your cardiology follow up appointment Contact information: [address] [place] [address] [phone]         Loura Back, NP. Schedule an appointment as soon as possible for a visit in 1 week(s).   Specialty: Nurse Practitioner Contact information: [address] [place] [address] [phone]                Discharge  Exam: Filed Weights   09/28/22 1643 09/30/22 0638 10/01/22 0442  Weight: 85.6 kg 86.2 kg 85.7 kg   General exam: Appears calm and comfortable  Respiratory system: Clear to auscultation. Respiratory effort normal. Cardiovascular system: S1 & S2 heard, RRR. No JVD, murmurs, rubs, gallops or clicks. No pedal edema. Gastrointestinal system: Abdomen is nondistended, soft and nontender. No organomegaly or masses felt. Normal bowel sounds heard. Central nervous system: Alert and oriented. No focal neurological deficits. Extremities: Symmetric 5 x 5 power. Skin: No rashes, lesions or ulcers Psychiatry: Judgement and insight appear normal. Mood & affect appropriate.  Condition at discharge: fair  The results of significant diagnostics from this hospitalization (including imaging, microbiology, ancillary and laboratory) are listed below for reference.   Imaging Studies: DG CHEST PORT 1 VIEW  Result Date: 09/30/2022 CLINICAL DATA:  [id number] Dyspnea [id number] EXAM: PORTABLE CHEST - 1 VIEW COMPARISON:  09/28/2022 FINDINGS: Lungs are clear. Heart size and mediastinal contours are within normal limits. Aortic Atherosclerosis (ICD10-170.0). Event monitor overlies the left lower chest. No effusion. Visualized bones unremarkable. IMPRESSION: No acute cardiopulmonary disease. Electronically Signed   By: Corlis Leak M.D.   On: 09/30/2022 10:04   US RENAL  Result Date: 09/29/2022 CLINICAL DATA:  [id number] AKI (acute kidney injury) (HCC) [id number] EXAM: RENAL / URINARY TRACT ULTRASOUND COMPLETE COMPARISON:  Renal ultrasound 07/20/2022 FINDINGS: Right Kidney: Renal measurements: 9.8 x 4.1 x 5.2 cm = volume: 109.5 mL. Minimal pelvic fullness. Normal echogenicity. Left Kidney: Renal measurements: 11.1 x 4.9 x 5.9 cm = volume: 169.2 mL. No hydronephrosis. Normal echogenicity. Bladder: Appears normal for degree of bladder distention. Other: None. IMPRESSION: Minimal right renal pelvic fullness.  No left renal hydronephrosis.  Electronically Signed   By: Caprice Renshaw M.D.   On: 09/29/2022 13:32   ECHOCARDIOGRAM COMPLETE  Result Date: 09/29/2022    ECHOCARDIOGRAM REPORT   Patient Name:   Heather Gross Date of Exam: 09/29/2022 Medical Rec #:  [id number]      Height:       65.0 in Accession #:    [id number]     Weight:       188.7 lb Date of Birth:  May 29, 1945       BSA:          1.930 m Patient Age:    77 years       BP:           106/53 mmHg Patient Gender: F              HR:           68 bpm. Exam Location:  Inpatient Procedure: 2D Echo, Cardiac Doppler, Color Doppler and Intracardiac            Opacification Agent Indications:    Dyspnea  History:        Patient has no prior history of Echocardiogram examinations.                 Risk Factors:Hypertension, Diabetes and HLD.  Sonographer:    Lucy Antigua Referring Phys: Herminio Heads IMPRESSIONS  1. Intracavitary gradient. Peak velocity 1.93 m/s. Peak gradient 14.9 mmHg. Left ventricular ejection fraction, by estimation, is 70 to 75%. The left ventricle has hyperdynamic function. The left ventricle has no regional wall motion abnormalities. There is mild concentric left ventricular hypertrophy. Left ventricular diastolic parameters are consistent with Grade I diastolic dysfunction (impaired relaxation).  2. Right ventricular systolic function is normal. The right ventricular size is normal. There is normal pulmonary artery systolic pressure.  3. The mitral valve is normal in structure. No evidence of mitral valve regurgitation. No evidence of mitral stenosis.  4. The aortic valve is tricuspid. Aortic valve regurgitation is not visualized. No aortic stenosis is present.  5. The inferior vena cava is normal in size with greater than 50% respiratory variability, suggesting right atrial pressure of 3 mmHg. FINDINGS  Left Ventricle: Intracavitary gradient. Peak velocity 1.93 m/s. Peak gradient 14.9 mmHg. Left ventricular ejection fraction, by estimation, is 70 to 75%. The left ventricle  has hyperdynamic function. The left ventricle has  no regional wall motion abnormalities. The left ventricular internal cavity size was normal in size. There is mild concentric left ventricular hypertrophy. Left ventricular diastolic parameters are consistent with Grade I diastolic dysfunction (impaired relaxation). Indeterminate filling pressures. Right Ventricle: The right ventricular size is normal. No increase in right ventricular wall thickness. Right ventricular systolic function is normal. There is normal pulmonary artery systolic pressure. The tricuspid regurgitant velocity is 2.34 m/s, and  with an assumed right atrial pressure of 3 mmHg, the estimated right ventricular systolic pressure is 24.9 mmHg. Left Atrium: Left atrial size was normal in size. Right Atrium: Right atrial size was normal in size. Pericardium: There is no evidence of pericardial effusion. Mitral Valve: The mitral valve is normal in structure. No evidence of mitral valve regurgitation. No evidence of mitral valve stenosis. Tricuspid Valve: The tricuspid valve is normal in structure. Tricuspid valve regurgitation is trivial. No evidence of tricuspid stenosis. Aortic Valve: The aortic valve is tricuspid. Aortic valve regurgitation is not visualized. No aortic stenosis is present. Aortic valve mean gradient measures 6.0 mmHg. Aortic valve peak gradient measures 9.2 mmHg. Aortic valve area, by VTI measures 2.36 cm. Pulmonic Valve: The pulmonic valve was normal in structure. Pulmonic valve regurgitation is not visualized. No evidence of pulmonic stenosis. Aorta: The aortic root is normal in size and structure. Venous: The inferior vena cava is normal in size with greater than 50% respiratory variability, suggesting right atrial pressure of 3 mmHg. IAS/Shunts: No atrial level shunt detected by color flow Doppler.  LEFT VENTRICLE PLAX 2D LVIDd:         3.70 cm   Diastology LVIDs:         2.20 cm   LV e' medial:    6.84 cm/s LV PW:         1.20  cm   LV E/e' medial:  10.2 LV IVS:        1.20 cm   LV e' lateral:   10.60 cm/s LVOT diam:     2.00 cm   LV E/e' lateral: 6.6 LV SV:         87 LV SV Index:   45 LVOT Area:     3.14 cm  RIGHT VENTRICLE RV S prime:     10.90 cm/s TAPSE (M-mode): 2.4 cm LEFT ATRIUM             Index LA Vol (A2C):   49.4 ml 25.60 ml/m LA Vol (A4C):   43.5 ml 22.54 ml/m LA Biplane Vol: 47.1 ml 24.41 ml/m  AORTIC VALVE AV Area (Vmax):    2.13 cm AV Area (Vmean):   2.25 cm AV Area (VTI):     2.36 cm AV Vmax:           152.00 cm/s AV Vmean:          115.000 cm/s AV VTI:            0.368 m AV Peak Grad:      9.2 mmHg AV Mean Grad:      6.0 mmHg LVOT Vmax:         103.00 cm/s LVOT Vmean:        82.200 cm/s LVOT VTI:          0.277 m LVOT/AV VTI ratio: 0.75  AORTA Ao Root diam: 3.20 cm Ao Asc diam:  3.40 cm MITRAL VALVE                TRICUSPID VALVE MV Area (PHT):  2.16 cm     TR Peak grad:   21.9 mmHg MV Decel Time: 352 msec     TR Vmax:        234.00 cm/s MV E velocity: 69.50 cm/s MV A velocity: 115.00 cm/s  SHUNTS MV E/A ratio:  0.60         Systemic VTI:  0.28 m                             Systemic Diam: 2.00 cm Chilton Si MD Electronically signed by Chilton Si MD Signature Date/Time: 09/29/2022/10:42:55 AM    Final    DG Chest Port 1 View  Result Date: 09/28/2022 CLINICAL DATA:  Shortness of breath EXAM: PORTABLE CHEST 1 VIEW COMPARISON:  01/10/2022 FINDINGS: The heart size and mediastinal contours are within normal limits. Both lungs are clear. No consolidation, pneumothorax, effusion or edema. The visualized skeletal structures are unremarkable. Overlapping cardiac leads. Presumed loop recorder along the lower left hemithorax, new from previous. IMPRESSION: No acute cardiopulmonary disease. Electronically Signed   By: Karen Kays M.D.   On: 09/28/2022 14:01   CUP PACEART REMOTE DEVICE CHECK  Result Date: 09/11/2022 ILR summary report received. Battery status OK. Normal device function. No new symptom, tachy,  brady, or pause episodes. Monthly summary reports and ROV/PRN Brief AT/AF per trends, < detection LA, CVRS   Microbiology: Results for orders placed or performed during the hospital encounter of 09/28/22  SARS Coronavirus 2 by RT PCR (hospital order, performed in [facility] hospital lab) *cepheid single result test* Anterior Nasal Swab     Status: None   Collection Time: 09/28/22  4:55 PM   Specimen: Anterior Nasal Swab  Result Value Ref Range Status   SARS Coronavirus 2 by RT PCR NEGATIVE NEGATIVE Final    Comment: Performed at [facility], [address], [place], [place] [address]  Respiratory (~20 pathogens) panel by PCR     Status: None   Collection Time: 09/28/22  4:56 PM   Specimen: Nasopharyngeal Swab; Respiratory  Result Value Ref Range Status   Adenovirus NOT DETECTED NOT DETECTED Final   Coronavirus 229E NOT DETECTED NOT DETECTED Final    Comment: (NOTE) The Coronavirus on the Respiratory Panel, DOES NOT test for the novel  Coronavirus (2019 nCoV)    Coronavirus HKU1 NOT DETECTED NOT DETECTED Final   Coronavirus NL63 NOT DETECTED NOT DETECTED Final   Coronavirus OC43 NOT DETECTED NOT DETECTED Final   Metapneumovirus NOT DETECTED NOT DETECTED Final   Rhinovirus / Enterovirus NOT DETECTED NOT DETECTED Final   Influenza A NOT DETECTED NOT DETECTED Final   Influenza B NOT DETECTED NOT DETECTED Final   Parainfluenza Virus 1 NOT DETECTED NOT DETECTED Final   Parainfluenza Virus 2 NOT DETECTED NOT DETECTED Final   Parainfluenza Virus 3 NOT DETECTED NOT DETECTED Final   Parainfluenza Virus 4 NOT DETECTED NOT DETECTED Final   Respiratory Syncytial Virus NOT DETECTED NOT DETECTED Final   Bordetella pertussis NOT DETECTED NOT DETECTED Final   Bordetella Parapertussis NOT DETECTED NOT DETECTED Final   Chlamydophila pneumoniae NOT DETECTED NOT DETECTED Final   Mycoplasma pneumoniae NOT DETECTED NOT DETECTED Final    Comment: Performed at [facility], [address], [place], [place] [address]  Culture, blood (Routine X 2) w Reflex to ID Panel     Status: None (Preliminary result)   Collection Time: 09/28/22  8:42 PM  Specimen: BLOOD LEFT ARM  Result Value Ref Range Status   Specimen Description BLOOD LEFT ARM  Final   Special Requests   Final    BOTTLES DRAWN AEROBIC AND ANAEROBIC Blood Culture adequate volume   Culture   Final    NO GROWTH 3 DAYS Performed at [facility], [address] [place], [place] [address]    Report Status PENDING  Incomplete  Culture, blood (Routine X 2) w Reflex to ID Panel     Status: None (Preliminary result)   Collection Time: 09/28/22  8:42 PM   Specimen: BLOOD LEFT ARM  Result Value Ref Range Status   Specimen Description BLOOD LEFT ARM  Final   Special Requests   Final    BOTTLES DRAWN AEROBIC ONLY Blood Culture adequate volume   Culture   Final    NO GROWTH 3 DAYS Performed at [facility], [address], [place], [place] [address]    Report Status PENDING  Incomplete  Urine Culture     Status: Abnormal   Collection Time: 09/29/22  9:19 PM   Specimen: Urine, Random  Result Value Ref Range Status   Specimen Description URINE, RANDOM  Final   Special Requests   Final    NONE Reflexed from [id number] Performed at [facility], [address] [place], [place] [address]    Culture (A)  Final    >=100,000 COLONIES/mL ENTEROCOCCUS FAECALIS 40,000 COLONIES/mL ESCHERICHIA COLI    Report Status 10/01/2022 FINAL  Final   Organism ID, Bacteria ENTEROCOCCUS FAECALIS (A)  Final   Organism ID, Bacteria ESCHERICHIA COLI (A)  Final      Susceptibility   Escherichia coli - MIC*    AMPICILLIN 8 SENSITIVE Sensitive     CEFAZOLIN <=4 SENSITIVE Sensitive     CEFEPIME <=0.12 SENSITIVE Sensitive     CEFTRIAXONE <=0.25 SENSITIVE Sensitive     CIPROFLOXACIN <=0.25 SENSITIVE Sensitive     GENTAMICIN <=1 SENSITIVE Sensitive     IMIPENEM <=0.25 SENSITIVE Sensitive     NITROFURANTOIN <=16 SENSITIVE  Sensitive     TRIMETH/SULFA <=20 SENSITIVE Sensitive     AMPICILLIN/SULBACTAM 4 SENSITIVE Sensitive     PIP/TAZO <=4 SENSITIVE Sensitive     * 40,000 COLONIES/mL ESCHERICHIA COLI   Enterococcus faecalis - MIC*    AMPICILLIN <=2 SENSITIVE Sensitive     NITROFURANTOIN <=16 SENSITIVE Sensitive     VANCOMYCIN 1 SENSITIVE Sensitive     * >=100,000 COLONIES/mL ENTEROCOCCUS FAECALIS    Labs: CBC: Recent Labs  Lab 09/28/22 1315 09/29/22 0019 09/30/22 0050 10/01/22 0738  WBC 7.1 6.7 7.3 6.7  NEUTROABS 4.4 3.6 3.7  --   HGB 11.2* 9.7* 10.5* 9.3*  HCT 34.8* 28.8* 31.1* 27.7*  MCV 93.0 89.7 93.4 93.0  PLT 176 149* 161 131*   Basic Metabolic Panel: Recent Labs  Lab 09/28/22 1315 09/28/22 1649 09/29/22 0019 09/29/22 1132 09/30/22 0050 10/01/22 0738  NA 138  --  137  --  140 138  K 3.3*  --  3.0*  --  4.2 3.7  CL 103  --  105  --  108 109  CO2 19*  --  21*  --  20* 20*  GLUCOSE 110*  --  103*  --  110* 124*  BUN 44*  --  41*  --  30* 24*  CREATININE 2.80*  --  2.59*  --  2.27* 2.17*  CALCIUM 10.0  --  9.1  --  9.3  9.0  MG  --  2.1  --  2.0  --   --    Liver Function Tests: Recent Labs  Lab 09/28/22 1315  AST 24  ALT 18  ALKPHOS 42  BILITOT 0.6  PROT 7.1  ALBUMIN 4.1   CBG: Recent Labs  Lab 09/30/22 1629 09/30/22 2138 10/01/22 0648 10/01/22 0743 10/01/22 1152  GLUCAP 104* 145* 116* 111* 98    Discharge time spent: 41 minutes.   Signed: Kathlen Mody, MD [facility] 10/01/2022

## 2022-10-02 LAB — CULTURE, BLOOD (ROUTINE X 2): Special Requests: ADEQUATE

## 2022-10-03 LAB — CULTURE, BLOOD (ROUTINE X 2): Culture: NO GROWTH

## 2022-10-06 NOTE — Progress Notes (Signed)
[facility] Summary Report / Loop Recorder 

## 2022-10-13 ENCOUNTER — Encounter (HOSPITAL_BASED_OUTPATIENT_CLINIC_OR_DEPARTMENT_OTHER): Payer: Self-pay | Admitting: Family

## 2022-10-13 ENCOUNTER — Ambulatory Visit (HOSPITAL_BASED_OUTPATIENT_CLINIC_OR_DEPARTMENT_OTHER): Payer: Medicare HMO | Admitting: Family

## 2022-10-13 VITALS — BP 142/80 | HR 72 | Ht 65.0 in | Wt 197.4 lb

## 2022-10-13 DIAGNOSIS — Z8673 Personal history of transient ischemic attack (TIA), and cerebral infarction without residual deficits: Secondary | ICD-10-CM | POA: Diagnosis not present

## 2022-10-13 DIAGNOSIS — E782 Mixed hyperlipidemia: Secondary | ICD-10-CM | POA: Diagnosis not present

## 2022-10-13 DIAGNOSIS — I10 Essential (primary) hypertension: Secondary | ICD-10-CM

## 2022-10-13 DIAGNOSIS — I480 Paroxysmal atrial fibrillation: Secondary | ICD-10-CM | POA: Diagnosis not present

## 2022-10-13 DIAGNOSIS — Z79899 Other long term (current) drug therapy: Secondary | ICD-10-CM | POA: Diagnosis not present

## 2022-10-13 DIAGNOSIS — D6859 Other primary thrombophilia: Secondary | ICD-10-CM

## 2022-10-13 MED ORDER — APIXABAN 5 MG PO TABS
5.0000 mg | ORAL_TABLET | Freq: Two times a day (BID) | ORAL | 11 refills | Status: DC
Start: 2022-10-13 — End: 2022-11-27

## 2022-10-13 MED ORDER — APIXABAN 5 MG PO TABS
5.0000 mg | ORAL_TABLET | Freq: Two times a day (BID) | ORAL | 0 refills | Status: AC
Start: 2022-10-13 — End: ?

## 2022-10-13 MED ORDER — METOPROLOL SUCCINATE ER 25 MG PO TB24
25.0000 mg | ORAL_TABLET | Freq: Every day | ORAL | 3 refills | Status: DC
Start: 2022-10-13 — End: 2023-11-09

## 2022-10-13 NOTE — Progress Notes (Signed)
Cardiology Office Note:  .   Date:  10/13/2022  ID:  Heather Gross, DOB 27-Jul-1945, MRN [id number] PCP: Loura Back, NP  [facility] Providers Cardiologist:  Chilton Si, MD    History of Present Illness: Heather Gross is a 77 y.o. female history of hypertension, anemia, LBBB, CVA with no residual deficits s/p loop recorder 11/2021, DM 2, CKDIIIa, hyperlipidemia, atrial fibrillation.   Prior CVA July 2023 with echo, event monitor unremarkable.  11/2021 a loop recorder was placed.  Admitted 5/30 - 10/01/2022 with acute respiratory failure with hypoxia with BNP/D-dimer/echo unremarkable, AKI from diuretic use with losartan and hydrochlorothiazide held on discharge.  There was also paroxysmal atrial fibrillation for which she was treated with low-dose beta-blocker and Eliquis.  She was treated for UTI on discharge.  Presents today for follow up with her spouse. She is feeling well since discharge. Reports no palptiations, chest pain.  No shortness of breath at rest and reports exertional dyspnea is improving.  Concerns regarding cost for Eliquis.  Reviewed pathophysiology and treatment of atrial fibrillation at length.  ROS: Please see the history of present illness.    All other systems reviewed and are negative.   Studies Reviewed: Marland Kitchen    EKG:  EKG is not ordered today.   Cardiac Studies & Procedures       ECHOCARDIOGRAM  ECHOCARDIOGRAM COMPLETE 09/29/2022  Narrative ECHOCARDIOGRAM REPORT    Patient Name:   Heather Gross Date of Exam: 09/29/2022 Medical Rec #:  [id number]      Height:       65.0 in Accession #:    [id number]     Weight:       188.7 lb Date of Birth:  [birth year]       BSA:          1.930 m Patient Age:    77 years       BP:           106/53 mmHg Patient Gender: F              HR:           68 bpm. Exam Location:  Inpatient  Procedure: 2D Echo, Cardiac Doppler, Color Doppler and Intracardiac Opacification Agent  Indications:    Dyspnea  History:         Patient has no prior history of Echocardiogram examinations. Risk Factors:Hypertension, Diabetes and HLD.  Sonographer:    Lucy Antigua Referring Phys: Herminio Heads  IMPRESSIONS   1. Intracavitary gradient. Peak velocity 1.93 m/s. Peak gradient 14.9 mmHg. Left ventricular ejection fraction, by estimation, is 70 to 75%. The left ventricle has hyperdynamic function. The left ventricle has no regional wall motion abnormalities. There is mild concentric left ventricular hypertrophy. Left ventricular diastolic parameters are consistent with Grade I diastolic dysfunction (impaired relaxation). 2. Right ventricular systolic function is normal. The right ventricular size is normal. There is normal pulmonary artery systolic pressure. 3. The mitral valve is normal in structure. No evidence of mitral valve regurgitation. No evidence of mitral stenosis. 4. The aortic valve is tricuspid. Aortic valve regurgitation is not visualized. No aortic stenosis is present. 5. The inferior vena cava is normal in size with greater than 50% respiratory variability, suggesting right atrial pressure of 3 mmHg.  FINDINGS Left Ventricle: Intracavitary gradient. Peak velocity 1.93 m/s. Peak gradient 14.9 mmHg. Left ventricular ejection fraction, by estimation, is 70 to 75%. The left ventricle has hyperdynamic function. The left ventricle has  no regional wall motion abnormalities. The left ventricular internal cavity size was normal in size. There is mild concentric left ventricular hypertrophy. Left ventricular diastolic parameters are consistent with Grade I diastolic dysfunction (impaired relaxation). Indeterminate filling pressures.  Right Ventricle: The right ventricular size is normal. No increase in right ventricular wall thickness. Right ventricular systolic function is normal. There is normal pulmonary artery systolic pressure. The tricuspid regurgitant velocity is 2.34 m/s, and with an assumed right  atrial pressure of 3 mmHg, the estimated right ventricular systolic pressure is 24.9 mmHg.  Left Atrium: Left atrial size was normal in size.  Right Atrium: Right atrial size was normal in size.  Pericardium: There is no evidence of pericardial effusion.  Mitral Valve: The mitral valve is normal in structure. No evidence of mitral valve regurgitation. No evidence of mitral valve stenosis.  Tricuspid Valve: The tricuspid valve is normal in structure. Tricuspid valve regurgitation is trivial. No evidence of tricuspid stenosis.  Aortic Valve: The aortic valve is tricuspid. Aortic valve regurgitation is not visualized. No aortic stenosis is present. Aortic valve mean gradient measures 6.0 mmHg. Aortic valve peak gradient measures 9.2 mmHg. Aortic valve area, by VTI measures 2.36 cm.  Pulmonic Valve: The pulmonic valve was normal in structure. Pulmonic valve regurgitation is not visualized. No evidence of pulmonic stenosis.  Aorta: The aortic root is normal in size and structure.  Venous: The inferior vena cava is normal in size with greater than 50% respiratory variability, suggesting right atrial pressure of 3 mmHg.  IAS/Shunts: No atrial level shunt detected by color flow Doppler.   LEFT VENTRICLE PLAX 2D LVIDd:         3.70 cm   Diastology LVIDs:         2.20 cm   LV e' medial:    6.84 cm/s LV PW:         1.20 cm   LV E/e' medial:  10.2 LV IVS:        1.20 cm   LV e' lateral:   10.60 cm/s LVOT diam:     2.00 cm   LV E/e' lateral: 6.6 LV SV:         87 LV SV Index:   45 LVOT Area:     3.14 cm   RIGHT VENTRICLE RV S prime:     10.90 cm/s TAPSE (M-mode): 2.4 cm  LEFT ATRIUM             Index LA Vol (A2C):   49.4 ml 25.60 ml/m LA Vol (A4C):   43.5 ml 22.54 ml/m LA Biplane Vol: 47.1 ml 24.41 ml/m AORTIC VALVE AV Area (Vmax):    2.13 cm AV Area (Vmean):   2.25 cm AV Area (VTI):     2.36 cm AV Vmax:           152.00 cm/s AV Vmean:          115.000 cm/s AV VTI:             0.368 m AV Peak Grad:      9.2 mmHg AV Mean Grad:      6.0 mmHg LVOT Vmax:         103.00 cm/s LVOT Vmean:        82.200 cm/s LVOT VTI:          0.277 m LVOT/AV VTI ratio: 0.75  AORTA Ao Root diam: 3.20 cm Ao Asc diam:  3.40 cm  MITRAL VALVE  TRICUSPID VALVE MV Area (PHT): 2.16 cm     TR Peak grad:   21.9 mmHg MV Decel Time: 352 msec     TR Vmax:        234.00 cm/s MV E velocity: 69.50 cm/s MV A velocity: 115.00 cm/s  SHUNTS MV E/A ratio:  0.60         Systemic VTI:  0.28 m Systemic Diam: 2.00 cm  Chilton Si MD Electronically signed by Chilton Si MD Signature Date/Time: 09/29/2022/10:42:55 AM    Final    MONITORS  LONG TERM MONITOR-LIVE TELEMETRY (3-14 DAYS) 12/07/2021  Narrative Patch Wear Time:  17 days and 20 hours  Predominant underlying rhythm was sinus rhythm Multiple runs of SVT, all less than 20 beats 10% PAC burden 3.2% PVC burden Patient triggered episodes associated with sinus rhythm and sinus tachycardia  Will Camnitz, MD           Risk Assessment/Calculations:    CHA2DS2-VASc Score = 8   This indicates a 10.8% annual risk of stroke. The patient's score is based upon: CHF History: 0 HTN History: 1 Diabetes History: 1 Stroke History: 2 Vascular Disease History: 1 Age Score: 2 Gender Score: 1    HYPERTENSION CONTROL Vitals:   10/13/22 0822 10/13/22 0845  BP: (!) 166/94 (!) 142/80    The patient's blood pressure is elevated above target today.  In order to address the patient's elevated BP: Blood pressure will be monitored at home to determine if medication changes need to be made.; Follow up with general cardiology has been recommended.          Physical Exam:   VS:  BP (!) 142/80   Pulse 72   Ht 5\' 5"  (1.651 m)   Wt 197 lb 6.4 oz (89.5 kg)   BMI 32.85 kg/m    Wt Readings from Last 3 Encounters:  10/13/22 197 lb 6.4 oz (89.5 kg)  10/01/22 188 lb 14.4 oz (85.7 kg)  05/08/22 190 lb (86.2 kg)    GEN:  Well nourished, overweight, well developed in no acute distress NECK: No JVD; No carotid bruits CARDIAC: RRR, no murmurs, rubs, gallops RESPIRATORY:  Clear to auscultation without rales, wheezing or rhonchi  ABDOMEN: Soft, non-tender, non-distended EXTREMITIES:  No edema; No deformity   ASSESSMENT AND PLAN: .    PAF / Hypercoagulable state - Diagnosis during recent admission. NSR by auscultation today. As no recurrent palpitations, prefers to defer monitor at this time. Could discuss at follow up again. Continue Toprol 25mg  every day. Refill provided.  Provided SHIIP phone number to call as well as Eliquis patient assistance paperwork to fill out. Eliquis sample was provided in clinic. CBC in 1 month for monitoring.  HTN - BP initially 166/94 with repeat 142/80 without intervention. Continue Toprol 25mg  every day. losartan and hydrochlorothiazide were held during recent admission.  She will begin monitoring BP at home and if persistently greater than 130/80 contact us with plans to utilize amlodipine.  CKDIIIa - Careful titration of diuretic and antihypertensive.    DM2 - Continue to follow with PCP.   HLD - Continue rosuvastatin, Zetia. Return for FLP in 1 month.  Hx of CVA -continue Plavix, Zetia, rosuvastatin.       Dispo: In 4 months with Dr. Duke Salvia or Alver Sorrow, NP   Signed, Alver Sorrow, NP

## 2022-10-13 NOTE — Patient Instructions (Signed)
Medication Instructions:  Your physician recommends that you continue on your current medications as directed. Please refer to the Current Medication list given to you today.  We have given you Eliquis and SHIPP paperwork today. Please fill these out.   We have also given you Eliquis samples today  *If you need a refill on your cardiac medications before your next appointment, please call your pharmacy*   Lab Work: Please return for Lab work in one month- for Fasting Lipid Panel and CBC . You may come to the...   [facility] (3rd floor) [address], [place], [place] [address]  Open: 8am-Noon and 1pm-4:30pm  Please ring the doorbell on the small table when you exit the elevator and the Lab Tech will come get you  [facility] at [facility] [address], [place], [place] [address] Open: 8am-1pm, then 2pm-4:30pm   [facility]- Please see attached locations sheet stapled to your lab work with address and hours.   If you have labs (blood work) drawn today and your tests are completely normal, you will receive your results only by: MyChart Message (if you have MyChart) OR A paper copy in the mail If you have any lab test that is abnormal or we need to change your treatment, we will call you to review the results.  Follow-Up: At [facility], you and your health needs are our priority.  As part of our continuing mission to provide you with exceptional heart care, we have created designated Provider Care Teams.  These Care Teams include your primary Cardiologist (physician) and Advanced Practice Providers (APPs -  Physician Assistants and Nurse Practitioners) who all work together to provide you with the care you need, when you need it.  We recommend signing up for the patient portal called "MyChart".  Sign up information is provided on this After Visit Summary.  MyChart is used to connect with patients for Virtual Visits (Telemedicine).   Patients are able to view lab/test results, encounter notes, upcoming appointments, etc.  Non-urgent messages can be sent to your provider as well.   To learn more about what you can do with MyChart, go to [email or web address].    Your next appointment:   4 months with Dr. Duke Salvia or Gillian Shields, NP

## 2022-10-16 ENCOUNTER — Ambulatory Visit (INDEPENDENT_AMBULATORY_CARE_PROVIDER_SITE_OTHER): Payer: Medicare HMO

## 2022-10-16 DIAGNOSIS — Z8673 Personal history of transient ischemic attack (TIA), and cerebral infarction without residual deficits: Secondary | ICD-10-CM | POA: Diagnosis not present

## 2022-10-16 LAB — CUP PACEART REMOTE DEVICE CHECK
Date Time Interrogation Session: 20240616230958
Implantable Pulse Generator Implant Date: 20230825

## 2022-10-27 ENCOUNTER — Telehealth (HOSPITAL_BASED_OUTPATIENT_CLINIC_OR_DEPARTMENT_OTHER): Payer: Self-pay

## 2022-10-27 NOTE — Telephone Encounter (Addendum)
Called patient,call could not be completed at this time. Will reattempt contact.     ----- Message from Marlene Lard, RN sent at 10/13/2022  4:47 PM EDT ----- BP check in

## 2022-10-27 NOTE — Telephone Encounter (Signed)
Call attempted to patient, no answer, unable to leave message

## 2022-10-30 ENCOUNTER — Encounter (HOSPITAL_BASED_OUTPATIENT_CLINIC_OR_DEPARTMENT_OTHER): Payer: Self-pay

## 2022-10-30 NOTE — Telephone Encounter (Signed)
3rd call attempt to patient, no answer, unable to leave VM, due to full mailbox. Letter mailed to patient asking to call with BP

## 2022-11-06 NOTE — Progress Notes (Signed)
[facility] Summary Report / Loop Recorder 

## 2022-11-13 ENCOUNTER — Telehealth: Payer: Self-pay | Admitting: Family

## 2022-11-13 MED ORDER — APIXABAN 5 MG PO TABS: 5.0000 mg | ORAL_TABLET | Freq: Two times a day (BID) | ORAL | Status: AC

## 2022-11-13 NOTE — Telephone Encounter (Signed)
Received return call from patient's husband who states patient needs Eliquis samples.  He reports submitting the patient assistance paperwork a couple of weeks ago.  Explained to Mr. Clute since he is awaiting patient assistance approval we will provide a month of samples for Heather Gross.  Samples placed at the front desk for pickup.  Jim Like MHA RN CCM

## 2022-11-13 NOTE — Addendum Note (Signed)
Addended by: Jim Like D on: 11/13/2022 10:44 AM   Modules accepted: Orders

## 2022-11-13 NOTE — Telephone Encounter (Signed)
Patient's husband is returning call. Requesting return call.

## 2022-11-13 NOTE — Telephone Encounter (Signed)
Attempted to return call to all 3 contact numbers available, Either no answer, or voicemail full.  Calling to discuss the request for Eliquis samples. Jim Like MHA RN CCM

## 2022-11-13 NOTE — Addendum Note (Signed)
Addended by: Jim Like D on: 11/13/2022 10:28 AM   Modules accepted: Orders

## 2022-11-13 NOTE — Telephone Encounter (Signed)
Patient calling the office for samples of medication:   1.  What medication and dosage are you requesting samples for? apixaban (ELIQUIS) 5 MG TABS tablet   2.  Are you currently out of this medication? Not yet, states pt has about 2 days left

## 2022-11-14 ENCOUNTER — Telehealth (HOSPITAL_BASED_OUTPATIENT_CLINIC_OR_DEPARTMENT_OTHER): Payer: Self-pay

## 2022-11-14 LAB — CBC
Hematocrit: 36.2 % (ref 34.0–46.6)
Hemoglobin: 11.6 g/dL (ref 11.1–15.9)
MCH: 30.4 pg (ref 26.6–33.0)
MCHC: 32 g/dL (ref 31.5–35.7)
MCV: 95 fL (ref 79–97)
Platelets: 191 10*3/uL (ref 150–450)
RBC: 3.82 x10E6/uL (ref 3.77–5.28)
RDW: 13.7 % (ref 11.7–15.4)
WBC: 7.8 10*3/uL (ref 3.4–10.8)

## 2022-11-14 LAB — LIPID PANEL
Chol/HDL Ratio: 2.9 ratio (ref 0.0–4.4)
Cholesterol, Total: 115 mg/dL (ref 100–199)
HDL: 40 mg/dL (ref >39)
LDL Chol Calc (NIH): 47 mg/dL (ref 0–99)
Triglycerides: 164 mg/dL — ABNORMAL HIGH (ref 0–149)
VLDL Cholesterol Cal: 28 mg/dL (ref 5–40)

## 2022-11-14 NOTE — Telephone Encounter (Signed)
2nd call attempt, no answer, unable to leave message due to full mail box.

## 2022-11-14 NOTE — Telephone Encounter (Addendum)
Call cannot be completed at this time, will reattempt contact.    ----- Message from Alver Sorrow sent at 11/14/2022  7:28 AM EDT ----- CBC with no evidence of anemia nor infection.  Cholesterol panel with LDL (bad cholesterol) at goal.  Continue with Zetia, rosuvastatin.  Triglycerides mildly elevated.  Ensure exercising regularly and reducing intake of carbohydrates (bread, rice, pasta, sweets).

## 2022-11-16 ENCOUNTER — Encounter (HOSPITAL_BASED_OUTPATIENT_CLINIC_OR_DEPARTMENT_OTHER): Payer: Self-pay

## 2022-11-16 NOTE — Telephone Encounter (Signed)
3rd call attempt, no answer, mail box full, results mailed to patient.    ----- Message from Alver Sorrow sent at 11/14/2022  7:28 AM EDT ----- CBC with no evidence of anemia nor infection.  Cholesterol panel with LDL (bad cholesterol) at goal.  Continue with Zetia, rosuvastatin.  Triglycerides mildly elevated.  Ensure exercising regularly and reducing intake of carbohydrates (bread, rice, pasta, sweets).

## 2022-11-20 ENCOUNTER — Ambulatory Visit (INDEPENDENT_AMBULATORY_CARE_PROVIDER_SITE_OTHER): Payer: Medicare HMO

## 2022-11-20 ENCOUNTER — Other Ambulatory Visit (HOSPITAL_BASED_OUTPATIENT_CLINIC_OR_DEPARTMENT_OTHER): Payer: Self-pay

## 2022-11-20 DIAGNOSIS — I639 Cerebral infarction, unspecified: Secondary | ICD-10-CM

## 2022-11-20 LAB — CUP PACEART REMOTE DEVICE CHECK
Date Time Interrogation Session: 20240719230531
Implantable Pulse Generator Implant Date: 20230825

## 2022-11-27 ENCOUNTER — Encounter: Payer: Self-pay | Admitting: Adult Health

## 2022-11-27 ENCOUNTER — Telehealth: Payer: Self-pay | Admitting: Cardiovascular Disease

## 2022-11-27 ENCOUNTER — Ambulatory Visit: Payer: Medicare HMO | Admitting: Adult Health

## 2022-11-27 VITALS — BP 162/81 | HR 86 | Ht 65.0 in | Wt 195.0 lb

## 2022-11-27 DIAGNOSIS — I480 Paroxysmal atrial fibrillation: Secondary | ICD-10-CM

## 2022-11-27 DIAGNOSIS — I6381 Other cerebral infarction due to occlusion or stenosis of small artery: Secondary | ICD-10-CM

## 2022-11-27 DIAGNOSIS — D6859 Other primary thrombophilia: Secondary | ICD-10-CM

## 2022-11-27 DIAGNOSIS — Z79899 Other long term (current) drug therapy: Secondary | ICD-10-CM

## 2022-11-27 MED ORDER — APIXABAN 5 MG PO TABS
5.0000 mg | ORAL_TABLET | Freq: Two times a day (BID) | ORAL | 3 refills | Status: AC
Start: 2022-11-27 — End: ?

## 2022-11-27 NOTE — Telephone Encounter (Signed)
Returned call to patient, he states that they submitted the patient's patient assistance. He states he called the [facility] and they told him it was incomplete. He didn't realize that he was supposed to bring it back for [place] to do provider portion. Advised patient that I can fill out provider portion and send it in. Will have to speak with provider about symptoms. He believes he has a few days left of the samples given to him.

## 2022-11-27 NOTE — Progress Notes (Signed)
PATIENT: Heather Gross DOB: [birth year]  REASON FOR VISIT: follow up HISTORY FROM: patient PRIMARY NEUROLOGIST: Dr. Pearlean Brownie  Chief Complaint  Patient presents with   RM 9    Here with her husband [name] for 6 month follow-up. Patient states she is doing much better. She is using a cane today. She states she has improved and nothing is bothering her. She hasn't had any falls.      HISTORY OF PRESENT ILLNESS: Today 11/27/22:   Heather Gross is a 77 y.o. female with a history of Lacunar stroke. Returns today for follow-up.  She is now on Eliquis due to A. Fib. Doing well with the medication.  Denies any strokelike symptoms.  Blood pressure remains under good control.  Remains on Crestor for her cholesterol.  .  Returns today for an evaluation.   05/08/2022 : She returns for follow-up after last visit with Everlene Other, NP on 12/28/2021.  Patient states she continues to have some gait and balance difficulties.  Walking is still wobbly but she has had no falls or injuries.  She uses a cane.  He is tolerating Plavix well without bleeding or bruising.  Her blood pressure is under good control.  Blood sugars have also been fine.  She is tolerating Crestor well without muscle aches and pains.  Diplopia resolved.  Loop recorder so far has not yet shown paroxysmal A-fib.  He has no new complaints today.  Hemoglobin A1c on 11/08/2021 was elevated at 7.0.   05/08/22: Heather Gross is a 77 year old female with a history of stroke.  She returns today for follow-up.  Patient had a stroke back in May and then went back into the hospital in July for double vision.  She is now only on Plavix 75 mg daily.Now has a loop recorder. Diplopia comes and goes. Only in the left eye. Will be seeing an opthamologist this month 9/16.  Overall she has been doing well since her hospitalization.  Reports that she is trying to eat healthy and walk daily.  She returns today for an evaluation.  HISTORY Heather Gross is a 77 y.o.  female with history of diabetes, hypertension, hyperlipidemia, CKD admitted for double vision. No tPA given due to outside window.     Stroke: Multifocal infarct, embolic, concerning for occult A-fib MRI left dorsal pontine small infarct, right paramedian midbrain infarct, left temporal and bilateral frontal small infarcts. MRA bilateral siphon, right M2 atherosclerosis Carotid Doppler unremarkable 2D Echo EF 60 to 65% LE venous Doppler negative for DVT EP recommend cardiac event monitoring given overnight several runs of atrial tachycardia. LDL 28 HgbA1c 7.0 Lovenox for VTE prophylaxis clopidogrel 75 mg daily prior to admission, now on aspirin 81 mg daily and Brilinta (ticagrelor) 90 mg bid for 30 days and then Plavix alone. Patient counseled to be compliant with her antithrombotic medications Ongoing aggressive stroke risk factor management Therapy recommendations: Home health PT and outpatient OT Disposition: Pending  History of stroke 09/01/2021 admitted for falling and difficulty speaking.  MRI showed right frontoparietal white matter small infarct.  EF 55 to 60%, LDL 60, A1c 6.5, CTA head and neck showed bilateral siphon, A3 and the right M2 stenosis.  Discharged on DAPT and Crestor 40 09/14/2021 admitted again for confusion, speech difficulty.  CT PE negative.  MRI showed subacute left thalamic infarct.  CTA head and neck unchanged.  Discharged on aspirin and Brilinta.  REVIEW OF SYSTEMS: Out of a complete 14 system review of symptoms, the patient  complains only of the following symptoms, and all other reviewed systems are negative.  ALLERGIES: Allergies  Allergen Reactions   Lipitor [Atorvastatin] Other (See Comments)    Made mouth very dry and the lips discolored   Shellfish-Derived Products Other (See Comments)    Hands break out   Latex Rash    HOME MEDICATIONS: Outpatient Medications Prior to Visit  Medication Sig Dispense Refill   apixaban (ELIQUIS) 5 MG TABS tablet  Take 1 tablet (5 mg total) by mouth 2 (two) times daily. 60 tablet 11   apixaban (ELIQUIS) 5 MG TABS tablet Take 1 tablet (5 mg total) by mouth 2 (two) times daily. 28 tablet 0   apixaban (ELIQUIS) 5 MG TABS tablet Take 1 tablet (5 mg total) by mouth 2 (two) times daily.     Calcium 500 MG CHEW Chew 2 tablets (1,000 mg total) by mouth daily. 30 tablet 0   clopidogrel (PLAVIX) 75 MG tablet Take 1 tablet (75 mg total) by mouth daily. Start on December 11, 2021 30 tablet 11   [employer] D3 2000 units CAPS TAKE 1 CAPSULE BY MOUTH EVERY DAY (Patient taking differently: Take 2,000 Units by mouth daily after breakfast.) 100 capsule 2   ezetimibe (ZETIA) 10 MG tablet Take 10 mg by mouth daily.     fluticasone (FLONASE) 50 MCG/ACT nasal spray Place 1-2 sprays into both nostrils daily as needed for allergies or rhinitis.     hydrOXYzine (ATARAX) 10 MG tablet Take 1 tablet (10 mg total) by mouth 3 (three) times daily as needed for anxiety. 30 tablet 0   metoprolol succinate (TOPROL-XL) 25 MG 24 hr tablet Take 1 tablet (25 mg total) by mouth daily. 90 tablet 3   Multiple Vitamin (MULTIVITAMIN) tablet Take 1 tablet by mouth daily with breakfast.     pantoprazole (PROTONIX) 20 MG tablet Take 1 tablet (20 mg total) by mouth daily. (Patient not taking: Reported on 09/28/2022) 30 tablet 3   rosuvastatin (CRESTOR) 40 MG tablet Take 40 mg by mouth at bedtime.     sodium bicarbonate 650 MG tablet Take 1 tablet (650 mg total) by mouth 2 (two) times daily. 30 tablet 1   No facility-administered medications prior to visit.    PAST MEDICAL HISTORY: Past Medical History:  Diagnosis Date   Diabetes mellitus without complication (HCC)    Glaucoma    Hyperlipidemia    Hypertension    Stroke [facility])     PAST SURGICAL HISTORY: Past Surgical History:  Procedure Laterality Date   BREAST BIOPSY Right 09/29/2015   BREAST EXCISIONAL BIOPSY Right 11/04/2015   papilloma   BREAST LUMPECTOMY WITH RADIOACTIVE SEED LOCALIZATION Right  11/04/2015   Procedure: BREAST LUMPECTOMY WITH RADIOACTIVE SEED LOCALIZATION;  Surgeon: Chevis Pretty III, MD;  Location: [facility];  Service: General;  Laterality: Right;   LIPOMA EXCISION      FAMILY HISTORY: Family History  Problem Relation Age of Onset   Heart disease Father    Diabetes Sister    Breast cancer Sister    Diabetes Sister    Diabetes Sister    Diabetes Sister    Diabetes Sister    Diabetes Brother     SOCIAL HISTORY: Social History   Socioeconomic History   Marital status: Married    Spouse name: Not on file   Number of children: Not on file   Years of education: Not on file   Highest education level: Not on file  Occupational History   Not  on file  Tobacco Use   Smoking status: Former    Current packs/day: 0.00    Types: Cigarettes    Quit date: 05/01/1990    Years since quitting: 32.5   Smokeless tobacco: Never  Substance and Sexual Activity   Alcohol use: No   Drug use: No   Sexual activity: Not Currently  Other Topics Concern   Not on file  Social History Narrative   Entered 06/2014:    Married x 48 years.   2 Children--2 sons   Social Determinants of Health   Financial Resource Strain: Low Risk  (10/24/2019)   Overall Financial Resource Strain (CARDIA)    Difficulty of Paying Living Expenses: Not very hard  Food Insecurity: No Food Insecurity (09/29/2022)   Hunger Vital Sign    Worried About Running Out of Food in the Last Year: Never true    Ran Out of Food in the Last Year: Never true  Transportation Needs: Unmet Transportation Needs (09/29/2022)   PRAPARE - Administrator, Civil Service (Medical): Yes    Lack of Transportation (Non-Medical): Yes  Physical Activity: Not on file  Stress: Not on file  Social Connections: Not on file  Intimate Partner Violence: Not At Risk (09/29/2022)   Humiliation, Afraid, Rape, and Kick questionnaire    Fear of Current or Ex-Partner: No    Emotionally Abused: No    Physically  Abused: No    Sexually Abused: No      PHYSICAL EXAM  Vitals:   11/27/22 1343  BP: (!) 162/81  Pulse: 86  Weight: 195 lb (88.5 kg)  Height: 5\' 5"  (1.651 m)    Body mass index is 32.45 kg/m.  Generalized: Well developed, in no acute distress   Neurological examination  Mentation: Alert oriented to time, place, history taking. Follows all commands speech and language fluent Cranial nerve II-XII: Pupils were equal round reactive to light. Extraocular movements were full, visual field were full on confrontational test. Facial sensation and strength were normal. Head turning and shoulder shrug  were normal and symmetric. Motor: The motor testing reveals 5 over 5 strength of all 4 extremities. Good symmetric motor tone is noted throughout.  Sensory: Sensory testing is intact to soft touch on all 4 extremities. No evidence of extinction is noted.  Coordination: Cerebellar testing reveals good finger-nose-finger and heel-to-shin bilaterally.  Gait and station: Gait is normal.    DIAGNOSTIC DATA (LABS, IMAGING, TESTING) - I reviewed patient records, labs, notes, testing and imaging myself where available.  Lab Results  Component Value Date   WBC 7.8 11/13/2022   HGB 11.6 11/13/2022   HCT 36.2 11/13/2022   MCV 95 11/13/2022   PLT 191 11/13/2022      Component Value Date/Time   NA 138 10/01/2022 0738   K 3.7 10/01/2022 0738   CL 109 10/01/2022 0738   CO2 20 (L) 10/01/2022 0738   GLUCOSE 124 (H) 10/01/2022 0738   BUN 24 (H) 10/01/2022 0738   CREATININE 2.17 (H) 10/01/2022 0738   CREATININE 1.12 (H) 12/30/2019 0821   CALCIUM 9.0 10/01/2022 0738   PROT 7.1 09/28/2022 1315   ALBUMIN 4.1 09/28/2022 1315   AST 24 09/28/2022 1315   ALT 18 09/28/2022 1315   ALKPHOS 42 09/28/2022 1315   BILITOT 0.6 09/28/2022 1315   GFRNONAA 23 (L) 10/01/2022 0738   GFRNONAA 49 (L) 12/30/2019 0821   GFRAA 56 (L) 12/30/2019 0821   Lab Results  Component Value Date  CHOL 115 11/13/2022    HDL 40 11/13/2022   LDLCALC 47 11/13/2022   LDLDIRECT 88.9 08/13/2006   TRIG 164 (H) 11/13/2022   CHOLHDL 2.9 11/13/2022   Lab Results  Component Value Date   HGBA1C 7.3 (H) 09/28/2022   Lab Results  Component Value Date   VITAMINB12 303 09/29/2022   Lab Results  Component Value Date   TSH 1.590 09/30/2022      ASSESSMENT AND PLAN 77 y.o. year old female  has a past medical history of Diabetes mellitus without complication (HCC), Glaucoma, Hyperlipidemia, Hypertension, and Stroke (HCC). here with:  Stroke: Multifocal infarct, embolic   Continue Eliquis  for secondary stroke prevention.  Discussed secondary stroke prevention measures and importance of close PCP follow up for aggressive stroke risk factor management. I have gone over the pathophysiology of stroke, warning signs and symptoms, risk factors and their management in some detail with instructions to go to the closest emergency room for symptoms of concern. HTN: BP goal <130/90.  Elevated today-encouraged her to recheck it at home.  If remains elevated make her PCP or cardiologist aware. HLD: LDL goal <70.  DMII: A1c goal<7.0.  Encouraged patient to monitor diet and encouraged exercise FU PRN      Butch Penny, MSN, NP-C 11/27/2022, 1:28 PM [facility] [address], [address] [place], [place] [address] [phone]

## 2022-11-27 NOTE — Telephone Encounter (Signed)
Calling in about getting assistant with getting medication. Please advise

## 2022-11-27 NOTE — Telephone Encounter (Signed)
Prescriber portion filled out and faxed with prescription faxed to [facility].

## 2022-11-28 NOTE — Telephone Encounter (Signed)
Received notification from patient assistance that patient needs to meet 3% out of pocket spent before being considered.   Called pharmacy to find out if in donut hole/cost of Eliquis, 30 day supply $45  Spoke with husband and $45 is more than they can afford  Advised husband to get the amount spent for him and his wife from pharmacy, bring to office and will fax to patient assistance He will get the medication from pharmacy this month to await answer from patient assitance  If patient needs to transition to Warfarin copay for visits is $10.00

## 2022-11-28 NOTE — Telephone Encounter (Signed)
FYI

## 2022-11-28 NOTE — Telephone Encounter (Signed)
Per Laural Golden, patient can get Xarelto for $89 through Xarelto and me, if cannot afford that would have to switch to warfarin.

## 2022-11-28 NOTE — Telephone Encounter (Signed)
Patient assistance line called, they can process her application in the next 1-3 days. They are missing 2024 OOP receipt. Will have patient send.   Consulted Gillian Shields, NP on next steps regarding samples. Laural Golden consulted.

## 2022-11-29 NOTE — Telephone Encounter (Signed)
If she is going to run out of Eliquis prior to completing patient assistance she will need to transition to Warfarin to prevent stroke. Can we ensure how many samples she has remaining?  Alver Sorrow, NP

## 2022-11-29 NOTE — Telephone Encounter (Signed)
Spoke with husband and he was able to get Eliquis for this month  Will bring out of pocket rx expense as soon as he gets it

## 2022-11-30 NOTE — Progress Notes (Signed)
[facility] Summary Report / Loop Recorder 

## 2022-12-21 LAB — CUP PACEART REMOTE DEVICE CHECK
Date Time Interrogation Session: 20240821231045
Implantable Pulse Generator Implant Date: 20230825

## 2022-12-25 ENCOUNTER — Ambulatory Visit (INDEPENDENT_AMBULATORY_CARE_PROVIDER_SITE_OTHER): Payer: Medicare HMO

## 2022-12-25 DIAGNOSIS — I639 Cerebral infarction, unspecified: Secondary | ICD-10-CM | POA: Diagnosis not present

## 2023-01-03 NOTE — Progress Notes (Signed)
[facility] Summary Report / Loop Recorder 

## 2023-01-24 LAB — CUP PACEART REMOTE DEVICE CHECK
Date Time Interrogation Session: 20240923231018
Implantable Pulse Generator Implant Date: 20230825

## 2023-01-29 ENCOUNTER — Ambulatory Visit (INDEPENDENT_AMBULATORY_CARE_PROVIDER_SITE_OTHER): Payer: Medicare HMO

## 2023-01-29 DIAGNOSIS — I639 Cerebral infarction, unspecified: Secondary | ICD-10-CM

## 2023-02-12 NOTE — Progress Notes (Signed)
[facility] Summary Report / Loop Recorder 

## 2023-02-13 ENCOUNTER — Ambulatory Visit (HOSPITAL_BASED_OUTPATIENT_CLINIC_OR_DEPARTMENT_OTHER): Payer: Medicare HMO | Admitting: Family

## 2023-02-27 LAB — CUP PACEART REMOTE DEVICE CHECK
Date Time Interrogation Session: 20241026230655
Implantable Pulse Generator Implant Date: 20230825

## 2023-03-05 ENCOUNTER — Ambulatory Visit (INDEPENDENT_AMBULATORY_CARE_PROVIDER_SITE_OTHER): Payer: Medicare HMO

## 2023-03-05 DIAGNOSIS — I639 Cerebral infarction, unspecified: Secondary | ICD-10-CM

## 2023-03-27 NOTE — Progress Notes (Signed)
[facility] Summary Report / Loop Recorder 

## 2023-04-09 ENCOUNTER — Ambulatory Visit (INDEPENDENT_AMBULATORY_CARE_PROVIDER_SITE_OTHER): Payer: Medicare HMO

## 2023-04-09 DIAGNOSIS — I639 Cerebral infarction, unspecified: Secondary | ICD-10-CM | POA: Diagnosis not present

## 2023-04-09 LAB — CUP PACEART REMOTE DEVICE CHECK
Date Time Interrogation Session: 20241208231218
Implantable Pulse Generator Implant Date: 20230825

## 2023-05-14 ENCOUNTER — Ambulatory Visit (INDEPENDENT_AMBULATORY_CARE_PROVIDER_SITE_OTHER): Payer: Medicare HMO

## 2023-05-14 DIAGNOSIS — I639 Cerebral infarction, unspecified: Secondary | ICD-10-CM | POA: Diagnosis not present

## 2023-05-14 LAB — CUP PACEART REMOTE DEVICE CHECK
Date Time Interrogation Session: 20250112231627
Implantable Pulse Generator Implant Date: 20230825

## 2023-06-18 ENCOUNTER — Ambulatory Visit (INDEPENDENT_AMBULATORY_CARE_PROVIDER_SITE_OTHER): Payer: Medicare HMO

## 2023-06-18 DIAGNOSIS — I639 Cerebral infarction, unspecified: Secondary | ICD-10-CM | POA: Diagnosis not present

## 2023-06-19 LAB — CUP PACEART REMOTE DEVICE CHECK
Date Time Interrogation Session: 20250216231345
Implantable Pulse Generator Implant Date: 20230825

## 2023-06-26 NOTE — Progress Notes (Signed)
 [facility] Summary Report / Loop Recorder

## 2023-07-10 ENCOUNTER — Ambulatory Visit: Attending: Registered Nurse | Admitting: Physical Therapy

## 2023-07-10 VITALS — BP 155/72 | HR 80

## 2023-07-10 DIAGNOSIS — R2689 Other abnormalities of gait and mobility: Secondary | ICD-10-CM | POA: Diagnosis present

## 2023-07-10 DIAGNOSIS — M6281 Muscle weakness (generalized): Secondary | ICD-10-CM | POA: Insufficient documentation

## 2023-07-10 DIAGNOSIS — R2681 Unsteadiness on feet: Secondary | ICD-10-CM | POA: Diagnosis present

## 2023-07-10 NOTE — Therapy (Signed)
 OUTPATIENT PHYSICAL THERAPY NEURO EVALUATION   Patient Name: Heather Gross MRN: [id number] DOB:03-01-1946, 78 y.o., female Today's Date: 07/10/2023   PCP: Loura Back, NP REFERRING PROVIDER: Loura Back, NP  END OF SESSION:  PT End of Session - 07/10/23 1016     Visit Number 1    Number of Visits 13   with eval   Date for PT Re-Evaluation 09/04/23    Authorization Type [employer]    PT Start Time 1015    PT Stop Time 1050   eval   PT Time Calculation (min) 35 min    Equipment Utilized During Treatment Gait belt    Activity Tolerance Patient tolerated treatment well    Behavior During Therapy WFL for tasks assessed/performed             Past Medical History:  Diagnosis Date   Diabetes mellitus without complication (HCC)    Glaucoma    Hyperlipidemia    Hypertension    Stroke [facility])    Past Surgical History:  Procedure Laterality Date   BREAST BIOPSY Right 09/29/2015   BREAST EXCISIONAL BIOPSY Right 11/04/2015   papilloma   BREAST LUMPECTOMY WITH RADIOACTIVE SEED LOCALIZATION Right 11/04/2015   Procedure: BREAST LUMPECTOMY WITH RADIOACTIVE SEED LOCALIZATION;  Surgeon: Chevis Pretty III, MD;  Location: [facility];  Service: General;  Laterality: Right;   LIPOMA EXCISION     Patient Active Problem List   Diagnosis Date Noted   AKI (acute kidney injury) (HCC) 09/28/2022   Family history of malignant neoplasm of digestive organs 10/28/2021   Cerebrovascular disease 09/13/2021   Acute ischemic stroke (HCC) 09/01/2021   LBBB (left bundle branch block) 09/01/2021   Obesity (BMI 30-39.9) 12/30/2019   Stage 3a chronic kidney disease (CKD) (HCC) -Baseline Scr 1.3-1.6 02/05/2017   Osteopenia 11/02/2016   Vitamin D deficiency 12/10/2014   Hyperlipidemia 06/11/2014   Type 2 diabetes mellitus with diabetic chronic kidney disease (HCC)    Glaucoma    Essential hypertension 06/28/2006   Gastroesophageal reflux disease 06/28/2006   ECZEMA, ATOPIC DERMATITIS  06/28/2006    ONSET DATE: 07/04/2023 (referral date)  REFERRING DIAG: M85.80 (ICD-10-CM) - Other specified disorders of bone density and structure, unspecified site  THERAPY DIAG:  Muscle weakness (generalized)  Unsteadiness on feet  Other abnormalities of gait and mobility  Rationale for Evaluation and Treatment: Rehabilitation  SUBJECTIVE:                                                                                                                                                                                             SUBJECTIVE STATEMENT: Patient familiar  to this clinic, seen by this therapist Aug-Oct 2023 following her stroke. Pt presents today with her husband who reports that she is no longer "wall-walking" but that her balance does seem to be off.  Pt accompanied by: significant other husband [name]  PERTINENT HISTORY: hypertension, hyperlipidemia, CKD 3, LBBB, obesity, and stroke in 2023  PAIN:  Are you having pain? No  PRECAUTIONS: Fall and Other: loop recorder  RED FLAGS: None   WEIGHT BEARING RESTRICTIONS: No  FALLS: Has patient fallen in last 6 months? Yes. Number of falls one fall, no injuries  LIVING ENVIRONMENT: Lives with: lives with their family (husband [name]) Lives in: House/apartment Stairs: Yes: External: 1 steps; none Has following equipment at home:  hurricane  PLOF: Independent with gait, Independent with transfers, and Requires assistive device for independence  PATIENT GOALS: "just my balance"  OBJECTIVE:  Note: Objective measures were completed at Evaluation unless otherwise noted.  DIAGNOSTIC FINDINGS: None relevant to this POC  COGNITION: Overall cognitive status: History of cognitive impairments - at baseline, memory impairments prior to her stroke   SENSATION: WFL  POSTURE: rounded shoulders, forward head, and posterior pelvic tilt  LOWER EXTREMITY ROM:   WFL  Active  Right Eval Left Eval  Hip flexion    Hip extension     Hip abduction    Hip adduction    Hip internal rotation    Hip external rotation    Knee flexion    Knee extension    Ankle dorsiflexion    Ankle plantarflexion    Ankle inversion    Ankle eversion     (Blank rows = not tested)  LOWER EXTREMITY MMT:    MMT Right Eval Left Eval  Hip flexion 4+ 4+  Hip extension    Hip abduction 4 4  Hip adduction 3 3  Hip internal rotation    Hip external rotation    Knee flexion 5 5  Knee extension 5 5  Ankle dorsiflexion 5 5  Ankle plantarflexion    Ankle inversion    Ankle eversion    (Blank rows = not tested)  BED MOBILITY:  Mod I per patient report  TRANSFERS: Assistive device utilized: None  Sit to stand: Modified independence Stand to sit: Modified independence Chair to chair: Modified independence Floor:  not assessed at eval   STAIRS: Level of Assistance: Min A Stair Negotiation Technique: Step to Pattern Alternating Pattern  with Bilateral Rails Number of Stairs: 4  Height of Stairs: 6  Comments: transitions from step through to step to when descending  GAIT: Gait pattern: decreased hip/knee flexion- Right, decreased hip/knee flexion- Left, decreased ankle dorsiflexion- Right, decreased ankle dorsiflexion- Left, and shuffling Distance walked: various clinic distances Assistive device utilized:  occasional use of hurrycane and None Level of assistance: CGA Comments: shuffles feet, decreased BLE clearance  FUNCTIONAL TESTS:    [facility] PT Assessment - 07/10/23 1029       Ambulation/Gait   Gait velocity 32.8 ft over 25.25 sec = 1.3 ft/sec      Standardized Balance Assessment   Standardized Balance Assessment Timed Up and Go Test;Five Times Sit to Stand    Five times sit to stand comments  29.06 sec   no UE     Timed Up and Go Test   TUG Normal TUG    Normal TUG (seconds) 24.75   with hurricane     Functional Gait  Assessment   Gait assessed  Yes    Gait Level Surface  Walks 20 ft, slow speed, abnormal gait  pattern, evidence for imbalance or deviates 10-15 in outside of the 12 in walkway width. Requires more than 7 sec to ambulate 20 ft.    Change in Gait Speed Makes only minor adjustments to walking speed, or accomplishes a change in speed with significant gait deviations, deviates 10-15 in outside the 12 in walkway width, or changes speed but loses balance but is able to recover and continue walking.    Gait with Horizontal Head Turns Performs head turns smoothly with slight change in gait velocity (eg, minor disruption to smooth gait path), deviates 6-10 in outside 12 in walkway width, or uses an assistive device.    Gait with Vertical Head Turns Performs task with slight change in gait velocity (eg, minor disruption to smooth gait path), deviates 6 - 10 in outside 12 in walkway width or uses assistive device    Gait and Pivot Turn Pivot turns safely in greater than 3 sec and stops with no loss of balance, or pivot turns safely within 3 sec and stops with mild imbalance, requires small steps to catch balance.    Step Over Obstacle Is able to step over one shoe box (4.5 in total height) but must slow down and adjust steps to clear box safely. May require verbal cueing.    Gait with Narrow Base of Support Ambulates less than 4 steps heel to toe or cannot perform without assistance.    Gait with Eyes Closed Cannot walk 20 ft without assistance, severe gait deviations or imbalance, deviates greater than 15 in outside 12 in walkway width or will not attempt task.    Ambulating Backwards Walks 20 ft, slow speed, abnormal gait pattern, evidence for imbalance, deviates 10-15 in outside 12 in walkway width.    Steps Two feet to a stair, must use rail.    Total Score 11    FGA comment: 11/30, high fall risk                                                                                                                                        TREATMENT: PT Evaluation    PATIENT EDUCATION: Education  details: Eval findings, results of OM and functional implications, PT POC Person educated: Patient and Spouse Education method: Explanation Education comprehension: verbalized understanding and needs further education  HOME EXERCISE PROGRAM: To be reviewed from prior HEP and adjusted as appropriate [email or web address] (code from prior POC)  GOALS: Goals reviewed with patient? Yes  SHORT TERM GOALS: Target date: 07/31/2023  Pt will be independent with assist from her spouse for initial HEP for improved strength, balance, transfers and gait. Baseline: Goal status: INITIAL  2.  Pt will improve 5 x STS to less than or equal to 25 seconds to demonstrate improved functional strength and transfer efficiency.  Baseline: 29.06 sec no UE (3/11) Goal status: INITIAL  3.  Pt will improve gait velocity to at least 1.6 ft/sec for improved gait efficiency and performance at CGA level  Baseline: 1.3 ft/sec with hurrycane CGA (3/11) Goal status: INITIAL  4.  Pt will improve normal TUG to less than or equal to 20 seconds for improved functional mobility and decreased fall risk. Baseline: 24.75 sec with hurrycane and CGA (3/11) Goal status: INITIAL  5.  Pt will improve FGA to 15/30 for decreased fall risk  Baseline: 11/30 (3/11) Goal status: INITIAL    LONG TERM GOALS: Target date: 08/21/2023   Pt will be independent with assist from her spouse final HEP for improved strength, balance, transfers and gait. Baseline:  Goal status: INITIAL  2.  Pt will improve 5 x STS to less than or equal to 20 seconds to demonstrate improved functional strength and transfer efficiency.  Baseline: 29.06 sec no UE (3/11) Goal status: INITIAL  3.  Pt will improve gait velocity to at least 1.9 ft/sec for improved gait efficiency and performance at Supervision level  Baseline: 1.3 ft/sec with hurrycane CGA (3/11) Goal status: INITIAL  4.  Pt will improve normal TUG to less than or equal to 16 seconds for improved  functional mobility and decreased fall risk. Baseline: 24.75 sec with hurrycane and CGA (3/11) Goal status: INITIAL  5.  Pt will improve FGA to 19/30 for decreased fall risk  Baseline: 11/30 (3/11) Goal status: INITIAL   ASSESSMENT:  CLINICAL IMPRESSION: Patient is a 78 year old female referred to Neuro [facility] for osteopenia but also presents today with complaints of impaired balance.   Pt's PMH is significant for: hypertension, hyperlipidemia, CKD 3, LBBB, obesity, and stroke in 2023. The following deficits were present during the exam: decreased BLE strength, impaired balance, and memory impairments. Based on her 5xSTS score of 29.06 sec, gait speed of 1.3 ft/sec, TUG score of 24.75 sec, and FGA score of 11/30, pt is an increased risk for falls. Pt would benefit from skilled PT to address these impairments and functional limitations to maximize functional mobility independence.   OBJECTIVE IMPAIRMENTS: Abnormal gait, decreased balance, decreased cognition, decreased mobility, difficulty walking, decreased strength, and decreased safety awareness.   ACTIVITY LIMITATIONS: carrying, lifting, bending, squatting, stairs, and bed mobility  PARTICIPATION LIMITATIONS: meal prep, cleaning, driving, and community activity  PERSONAL FACTORS: Age, Fitness, Time since onset of injury/illness/exacerbation, and 3+ comorbidities:    hypertension, hyperlipidemia, CKD 3, LBBB, obesity, and stroke in 2023 are also affecting patient's functional outcome.   REHAB POTENTIAL: Good  CLINICAL DECISION MAKING: Evolving/moderate complexity  EVALUATION COMPLEXITY: Moderate  PLAN:  PT FREQUENCY: 1-2x/week  PT DURATION: 6 weeks  PLANNED INTERVENTIONS: 97164- PT Re-evaluation, 97110-Therapeutic exercises, 97530- Therapeutic activity, 97112- Neuromuscular re-education, 97535- Self Care, 16109- Manual therapy, [phone]- Gait training, [phone]- Orthotic Fit/training, [phone]- Electrical stimulation (manual),  Patient/Family education, Balance training, Stair training, DME instructions, Cryotherapy, and Moist heat  PLAN FOR NEXT SESSION:  review/addend prior HEP, work on functional strengthening and dynamic balance, increasing step length and step height (increase hip and knee flexion)   Peter Congo, PT Peter Congo, PT, DPT, CSRS  07/10/2023, 10:54 AM

## 2023-07-17 ENCOUNTER — Ambulatory Visit: Admitting: Physical Therapy

## 2023-07-17 DIAGNOSIS — R2681 Unsteadiness on feet: Secondary | ICD-10-CM

## 2023-07-17 DIAGNOSIS — R2689 Other abnormalities of gait and mobility: Secondary | ICD-10-CM

## 2023-07-17 DIAGNOSIS — M6281 Muscle weakness (generalized): Secondary | ICD-10-CM | POA: Diagnosis not present

## 2023-07-17 NOTE — Therapy (Signed)
 OUTPATIENT PHYSICAL THERAPY NEURO TREATMENT   Patient Name: Heather Gross MRN: [id number] DOB:[birth year], 78 y.o., female Today's Date: 07/17/2023   PCP: Loura Back, NP REFERRING PROVIDER: Loura Back, NP  END OF SESSION:  PT End of Session - 07/17/23 1021     Visit Number 2    Number of Visits 13   with eval   Date for PT Re-Evaluation 09/04/23    Authorization Type [employer]    PT Start Time 1019    PT Stop Time 1058    PT Time Calculation (min) 39 min    Equipment Utilized During Treatment Gait belt    Activity Tolerance Patient tolerated treatment well    Behavior During Therapy WFL for tasks assessed/performed              Past Medical History:  Diagnosis Date   Diabetes mellitus without complication (HCC)    Glaucoma    Hyperlipidemia    Hypertension    Stroke [facility])    Past Surgical History:  Procedure Laterality Date   BREAST BIOPSY Right 09/29/2015   BREAST EXCISIONAL BIOPSY Right 11/04/2015   papilloma   BREAST LUMPECTOMY WITH RADIOACTIVE SEED LOCALIZATION Right 11/04/2015   Procedure: BREAST LUMPECTOMY WITH RADIOACTIVE SEED LOCALIZATION;  Surgeon: Chevis Pretty III, MD;  Location: [facility];  Service: General;  Laterality: Right;   LIPOMA EXCISION     Patient Active Problem List   Diagnosis Date Noted   AKI (acute kidney injury) (HCC) 09/28/2022   Family history of malignant neoplasm of digestive organs 10/28/2021   Cerebrovascular disease 09/13/2021   Acute ischemic stroke (HCC) 09/01/2021   LBBB (left bundle branch block) 09/01/2021   Obesity (BMI 30-39.9) 12/30/2019   Stage 3a chronic kidney disease (CKD) (HCC) -Baseline Scr 1.3-1.6 02/05/2017   Osteopenia 11/02/2016   Vitamin D deficiency 12/10/2014   Hyperlipidemia 06/11/2014   Type 2 diabetes mellitus with diabetic chronic kidney disease (HCC)    Glaucoma    Essential hypertension 06/28/2006   Gastroesophageal reflux disease 06/28/2006   ECZEMA, ATOPIC DERMATITIS  06/28/2006    ONSET DATE: 07/04/2023 (referral date)  REFERRING DIAG: M85.80 (ICD-10-CM) - Other specified disorders of bone density and structure, unspecified site  THERAPY DIAG:  Muscle weakness (generalized)  Unsteadiness on feet  Other abnormalities of gait and mobility  Rationale for Evaluation and Treatment: Rehabilitation  SUBJECTIVE:                                                                                                                                                                                             SUBJECTIVE STATEMENT: Patient presents holding  hurricane. Denies changes or pain.   Pt accompanied by: significant other husband [name]  PERTINENT HISTORY: hypertension, hyperlipidemia, CKD 3, LBBB, obesity, and stroke in 2023  PAIN:  Are you having pain? No  PRECAUTIONS: Fall and Other: loop recorder  RED FLAGS: None   WEIGHT BEARING RESTRICTIONS: No  FALLS: Has patient fallen in last 6 months? Yes. Number of falls one fall, no injuries  LIVING ENVIRONMENT: Lives with: lives with their family (husband [name]) Lives in: House/apartment Stairs: Yes: External: 1 steps; none Has following equipment at home:  hurricane  PLOF: Independent with gait, Independent with transfers, and Requires assistive device for independence  PATIENT GOALS: "just my balance"  OBJECTIVE:  Note: Objective measures were completed at Evaluation unless otherwise noted.  DIAGNOSTIC FINDINGS: None relevant to this POC  COGNITION: Overall cognitive status: History of cognitive impairments - at baseline, memory impairments prior to her stroke   SENSATION: WFL  POSTURE: rounded shoulders, forward head, and posterior pelvic tilt  LOWER EXTREMITY ROM:   WFL  Active  Right Eval Left Eval  Hip flexion    Hip extension    Hip abduction    Hip adduction    Hip internal rotation    Hip external rotation    Knee flexion    Knee extension    Ankle dorsiflexion    Ankle  plantarflexion    Ankle inversion    Ankle eversion     (Blank rows = not tested)  LOWER EXTREMITY MMT:    MMT Right Eval Left Eval  Hip flexion 4+ 4+  Hip extension    Hip abduction 4 4  Hip adduction 3 3  Hip internal rotation    Hip external rotation    Knee flexion 5 5  Knee extension 5 5  Ankle dorsiflexion 5 5  Ankle plantarflexion    Ankle inversion    Ankle eversion    (Blank rows = not tested)  BED MOBILITY:  Mod I per patient report  TRANSFERS: Assistive device utilized: None  Sit to stand: Modified independence Stand to sit: Modified independence Chair to chair: Modified independence Floor:  not assessed at eval   STAIRS: Level of Assistance: Min A Stair Negotiation Technique: Step to Pattern Alternating Pattern  with Bilateral Rails Number of Stairs: 4  Height of Stairs: 6  Comments: transitions from step through to step to when descending  GAIT: Gait pattern: decreased hip/knee flexion- Right, decreased hip/knee flexion- Left, decreased ankle dorsiflexion- Right, decreased ankle dorsiflexion- Left, and shuffling Distance walked: various clinic distances Assistive device utilized:  occasional use of hurrycane and None Level of assistance: CGA Comments: shuffles feet, decreased BLE clearance                                                                                                               TREATMENT:  Ther Act  SciFit multi-peaks level 2.5 for 8 minutes using BUE/BLEs for neural priming for reciprocal movement, dynamic cardiovascular warmup and increased amplitude of stepping. RPE of 5/10 following activity  Reviewed/updated HEP from previous POC (see bolded below)  - Forward and lateral Monster Walk with Red Resistance at Ankles and UE support, x5 reps each direction at ballet bar. Mod verbal cues for increased step length/clearance of RLE and to maintain wide BOS.   - Step-Overs using wooden dowel, x10 reps w/single UE support. Min cues  to step OVER the dowel and not AROUND  - Side Step Overs using wooden dowel, x10 reps per side w/BUE support. Pt required mod concurrent cues to maintain sequence, so educated husband on cueing her at home.    Tandem walking at ballet bar, x4 reps w/single UE support. Very easy for pt, so did not add to HEP     PATIENT EDUCATION: Education details: Initial HEP Person educated: Patient and Spouse Education method: Explanation, Demonstration, Verbal cues, and Handouts Education comprehension: verbalized understanding, returned demonstration, verbal cues required, and needs further education  HOME EXERCISE PROGRAM:  Access Code: [email or web address] URL: [email or web address] Date: 07/17/2023 Prepared by: Alethia Berthold Roseann Kees  Exercises - Side Stepping with Resistance at Ankles and Counter Support  - 1 x daily - 7 x weekly - 1 sets - 10 reps - Step-Over  - 1 x daily - 7 x weekly - 3 sets - 10 reps - Forward Monster Walk with Resistance at Ankles and Counter Support  - 1 x daily - 7 x weekly - 3 sets - 10 reps - Side Step Over  - 1 x daily - 7 x weekly - 3 sets - 10 reps  GOALS: Goals reviewed with patient? Yes  SHORT TERM GOALS: Target date: 07/31/2023  Pt will be independent with assist from her spouse for initial HEP for improved strength, balance, transfers and gait. Baseline: Goal status: INITIAL  2.  Pt will improve 5 x STS to less than or equal to 25 seconds to demonstrate improved functional strength and transfer efficiency.  Baseline: 29.06 sec no UE (3/11) Goal status: INITIAL  3.  Pt will improve gait velocity to at least 1.6 ft/sec for improved gait efficiency and performance at CGA level  Baseline: 1.3 ft/sec with hurrycane CGA (3/11) Goal status: INITIAL  4.  Pt will improve normal TUG to less than or equal to 20 seconds for improved functional mobility and decreased fall risk. Baseline: 24.75 sec with hurrycane and CGA (3/11) Goal status: INITIAL  5.  Pt will  improve FGA to 15/30 for decreased fall risk  Baseline: 11/30 (3/11) Goal status: INITIAL    LONG TERM GOALS: Target date: 08/21/2023   Pt will be independent with assist from her spouse final HEP for improved strength, balance, transfers and gait. Baseline:  Goal status: INITIAL  2.  Pt will improve 5 x STS to less than or equal to 20 seconds to demonstrate improved functional strength and transfer efficiency.  Baseline: 29.06 sec no UE (3/11) Goal status: INITIAL  3.  Pt will improve gait velocity to at least 1.9 ft/sec for improved gait efficiency and performance at Supervision level  Baseline: 1.3 ft/sec with hurrycane CGA (3/11) Goal status: INITIAL  4.  Pt will improve normal TUG to less than or equal to 16 seconds for improved functional mobility and decreased fall risk. Baseline: 24.75 sec with hurrycane and CGA (3/11) Goal status: INITIAL  5.  Pt will improve FGA to 19/30 for decreased fall risk  Baseline: 11/30 (3/11) Goal status: INITIAL   ASSESSMENT:  CLINICAL IMPRESSION: Emphasis of skilled PT session on functional endurance and establishing HEP for increased step  clearance/length and BLE strength. Pt tolerated session well w/short seated rest break and was most challenged by monster walks. Pt does require min-mod concurrent cues during exercise to perform properly, which husband educated on. Continue POC.    OBJECTIVE IMPAIRMENTS: Abnormal gait, decreased balance, decreased cognition, decreased mobility, difficulty walking, decreased strength, and decreased safety awareness.   ACTIVITY LIMITATIONS: carrying, lifting, bending, squatting, stairs, and bed mobility  PARTICIPATION LIMITATIONS: meal prep, cleaning, driving, and community activity  PERSONAL FACTORS: Age, Fitness, Time since onset of injury/illness/exacerbation, and 3+ comorbidities:    hypertension, hyperlipidemia, CKD 3, LBBB, obesity, and stroke in 2023 are also affecting patient's functional  outcome.   REHAB POTENTIAL: Good  CLINICAL DECISION MAKING: Evolving/moderate complexity  EVALUATION COMPLEXITY: Moderate  PLAN:  PT FREQUENCY: 1-2x/week  PT DURATION: 6 weeks  PLANNED INTERVENTIONS: 97164- PT Re-evaluation, 97110-Therapeutic exercises, 97530- Therapeutic activity, [id number]- Neuromuscular re-education, 97535- Self Care, [id number]- Manual therapy, [id number]- Gait training, [phone]- Orthotic Fit/training, [phone]- Electrical stimulation (manual), Patient/Family education, Balance training, Stair training, DME instructions, Cryotherapy, and Moist heat  PLAN FOR NEXT SESSION:  How is? HEP, work on functional strengthening and dynamic balance, increasing step length and step height (increase hip and knee flexion)   [place] Dod[place] E Jordyn Hofacker, PT, DPT  07/17/2023, 11:33 AM

## 2023-07-23 ENCOUNTER — Ambulatory Visit (INDEPENDENT_AMBULATORY_CARE_PROVIDER_SITE_OTHER): Payer: Medicare HMO

## 2023-07-23 DIAGNOSIS — I639 Cerebral infarction, unspecified: Secondary | ICD-10-CM | POA: Diagnosis not present

## 2023-07-23 LAB — CUP PACEART REMOTE DEVICE CHECK
Date Time Interrogation Session: 20250323231308
Implantable Pulse Generator Implant Date: 20230825

## 2023-07-24 ENCOUNTER — Ambulatory Visit: Admitting: Physical Therapy

## 2023-07-24 VITALS — BP 169/96 | HR 94

## 2023-07-24 DIAGNOSIS — M6281 Muscle weakness (generalized): Secondary | ICD-10-CM

## 2023-07-24 DIAGNOSIS — R2681 Unsteadiness on feet: Secondary | ICD-10-CM

## 2023-07-24 DIAGNOSIS — R2689 Other abnormalities of gait and mobility: Secondary | ICD-10-CM

## 2023-07-24 NOTE — Therapy (Signed)
 OUTPATIENT PHYSICAL THERAPY NEURO TREATMENT   Patient Name: Heather Gross MRN: [id number] DOB:December 01, 1945, 78 y.o., female Today's Date: 07/24/2023   PCP: Loura Back, NP REFERRING PROVIDER: Loura Back, NP  END OF SESSION:  PT End of Session - 07/24/23 0852     Visit Number 3    Number of Visits 13   with eval   Date for PT Re-Evaluation 09/04/23    Authorization Type [employer]    PT Start Time 0850    PT Stop Time 0935    PT Time Calculation (min) 45 min    Equipment Utilized During Treatment Gait belt    Activity Tolerance Patient tolerated treatment well    Behavior During Therapy WFL for tasks assessed/performed               Past Medical History:  Diagnosis Date   Diabetes mellitus without complication (HCC)    Glaucoma    Hyperlipidemia    Hypertension    Stroke [facility])    Past Surgical History:  Procedure Laterality Date   BREAST BIOPSY Right 09/29/2015   BREAST EXCISIONAL BIOPSY Right 11/04/2015   papilloma   BREAST LUMPECTOMY WITH RADIOACTIVE SEED LOCALIZATION Right 11/04/2015   Procedure: BREAST LUMPECTOMY WITH RADIOACTIVE SEED LOCALIZATION;  Surgeon: Chevis Pretty III, MD;  Location: [facility];  Service: General;  Laterality: Right;   LIPOMA EXCISION     Patient Active Problem List   Diagnosis Date Noted   AKI (acute kidney injury) (HCC) 09/28/2022   Family history of malignant neoplasm of digestive organs 10/28/2021   Cerebrovascular disease 09/13/2021   Acute ischemic stroke (HCC) 09/01/2021   LBBB (left bundle branch block) 09/01/2021   Obesity (BMI 30-39.9) 12/30/2019   Stage 3a chronic kidney disease (CKD) (HCC) -Baseline Scr 1.3-1.6 02/05/2017   Osteopenia 11/02/2016   Vitamin D deficiency 12/10/2014   Hyperlipidemia 06/11/2014   Type 2 diabetes mellitus with diabetic chronic kidney disease (HCC)    Glaucoma    Essential hypertension 06/28/2006   Gastroesophageal reflux disease 06/28/2006   ECZEMA, ATOPIC DERMATITIS  06/28/2006    ONSET DATE: 07/04/2023 (referral date)  REFERRING DIAG: M85.80 (ICD-10-CM) - Other specified disorders of bone density and structure, unspecified site  THERAPY DIAG:  Muscle weakness (generalized)  Unsteadiness on feet  Other abnormalities of gait and mobility  Rationale for Evaluation and Treatment: Rehabilitation  SUBJECTIVE:                                                                                                                                                                                             SUBJECTIVE STATEMENT: Pt denies  any acute changes since last visit, no falls. No complaints of pain today. Pt reports having no questions over her HEP.  Pt did take her BP medication just prior to coming to PT.  Pt accompanied by: significant other husband [name]  PERTINENT HISTORY: hypertension, hyperlipidemia, CKD 3, LBBB, obesity, and stroke in 2023  PAIN:  Are you having pain? No  PRECAUTIONS: Fall and Other: loop recorder  RED FLAGS: None   WEIGHT BEARING RESTRICTIONS: No  FALLS: Has patient fallen in last 6 months? Yes. Number of falls one fall, no injuries  LIVING ENVIRONMENT: Lives with: lives with their family (husband [name]) Lives in: House/apartment Stairs: Yes: External: 1 steps; none Has following equipment at home:  hurricane  PLOF: Independent with gait, Independent with transfers, and Requires assistive device for independence  PATIENT GOALS: "just my balance"  OBJECTIVE:  Note: Objective measures were completed at Evaluation unless otherwise noted.  DIAGNOSTIC FINDINGS: None relevant to this POC  COGNITION: Overall cognitive status: History of cognitive impairments - at baseline, memory impairments prior to her stroke   SENSATION: WFL  POSTURE: rounded shoulders, forward head, and posterior pelvic tilt  LOWER EXTREMITY ROM:   WFL  Active  Right Eval Left Eval  Hip flexion    Hip extension    Hip abduction    Hip  adduction    Hip internal rotation    Hip external rotation    Knee flexion    Knee extension    Ankle dorsiflexion    Ankle plantarflexion    Ankle inversion    Ankle eversion     (Blank rows = not tested)  LOWER EXTREMITY MMT:    MMT Right Eval Left Eval  Hip flexion 4+ 4+  Hip extension    Hip abduction 4 4  Hip adduction 3 3  Hip internal rotation    Hip external rotation    Knee flexion 5 5  Knee extension 5 5  Ankle dorsiflexion 5 5  Ankle plantarflexion    Ankle inversion    Ankle eversion    (Blank rows = not tested)  BED MOBILITY:  Mod I per patient report  TRANSFERS: Assistive device utilized: None  Sit to stand: Modified independence Stand to sit: Modified independence Chair to chair: Modified independence Floor:  not assessed at eval   STAIRS: Level of Assistance: Min A Stair Negotiation Technique: Step to Pattern Alternating Pattern  with Bilateral Rails Number of Stairs: 4  Height of Stairs: 6  Comments: transitions from step through to step to when descending  GAIT: Gait pattern: decreased hip/knee flexion- Right, decreased hip/knee flexion- Left, decreased ankle dorsiflexion- Right, decreased ankle dorsiflexion- Left, and shuffling Distance walked: various clinic distances Assistive device utilized:  occasional use of hurrycane and None Level of assistance: CGA Comments: shuffles feet, decreased BLE clearance                                                                                                               TREATMENT:   Self-Care Vitals:  07/24/23 0859  BP: (!) 169/96  Pulse: 94   BP assessed in LUE in sitting prior to intervention. BP is elevated but within safe range for participation in PT session. Pt did just take her BP meds prior to coming to PT session.   TherAct  SciFit multi-peaks level 3 for 5 minutes using BUE/BLEs for neural priming for reciprocal movement, dynamic cardiovascular warmup and increased amplitude  of stepping. Cues to increase amplitude of movements. Unable to give an RPE following exercise. In // bars to work on increasing step length and LE clearance: 4" hurdles forwards step overs with progression from step to to step through gait pattern 6 x 5 ft 4" hurdles laterally 3 x 5 ft L/R Gait x 10 ft with focus on decreasing # of steps needed to cover distance Progresses from 14 steps to only 9 steps Carryover to gait around therapy gym x 115 ft with increased step length noted Forwards/backwards step overs of foam beam x 10 reps B 6" step taps with progression to step-ups x 10 reps each    PATIENT EDUCATION: Education details: continue HEP Person educated: Patient and Spouse Education method: Explanation, Demonstration, and Verbal cues Education comprehension: verbalized understanding, returned demonstration, verbal cues required, and needs further education  HOME EXERCISE PROGRAM:  Access Code: [email or web address] URL: [email or web address] Date: 07/17/2023 Prepared by: Alethia Berthold Plaster  Exercises - Side Stepping with Resistance at Ankles and Counter Support  - 1 x daily - 7 x weekly - 1 sets - 10 reps - Step-Over  - 1 x daily - 7 x weekly - 3 sets - 10 reps - Forward Monster Walk with Resistance at Ankles and Counter Support  - 1 x daily - 7 x weekly - 3 sets - 10 reps - Side Step Over  - 1 x daily - 7 x weekly - 3 sets - 10 reps  GOALS: Goals reviewed with patient? Yes  SHORT TERM GOALS: Target date: 07/31/2023  Pt will be independent with assist from her spouse for initial HEP for improved strength, balance, transfers and gait. Baseline: Goal status: INITIAL  2.  Pt will improve 5 x STS to less than or equal to 25 seconds to demonstrate improved functional strength and transfer efficiency.  Baseline: 29.06 sec no UE (3/11) Goal status: INITIAL  3.  Pt will improve gait velocity to at least 1.6 ft/sec for improved gait efficiency and performance at CGA level   Baseline: 1.3 ft/sec with hurrycane CGA (3/11) Goal status: INITIAL  4.  Pt will improve normal TUG to less than or equal to 20 seconds for improved functional mobility and decreased fall risk. Baseline: 24.75 sec with hurrycane and CGA (3/11) Goal status: INITIAL  5.  Pt will improve FGA to 15/30 for decreased fall risk  Baseline: 11/30 (3/11) Goal status: INITIAL    LONG TERM GOALS: Target date: 08/21/2023   Pt will be independent with assist from her spouse final HEP for improved strength, balance, transfers and gait. Baseline:  Goal status: INITIAL  2.  Pt will improve 5 x STS to less than or equal to 20 seconds to demonstrate improved functional strength and transfer efficiency.  Baseline: 29.06 sec no UE (3/11) Goal status: INITIAL  3.  Pt will improve gait velocity to at least 1.9 ft/sec for improved gait efficiency and performance at Supervision level  Baseline: 1.3 ft/sec with hurrycane CGA (3/11) Goal status: INITIAL  4.  Pt will improve normal TUG to less than or equal to  16 seconds for improved functional mobility and decreased fall risk. Baseline: 24.75 sec with hurrycane and CGA (3/11) Goal status: INITIAL  5.  Pt will improve FGA to 19/30 for decreased fall risk  Baseline: 11/30 (3/11) Goal status: INITIAL   ASSESSMENT:  CLINICAL IMPRESSION: Emphasis of skilled PT session on continuing to work on increasing step length and step height for improved gait mechanics and decreased fall risk. Pt exhibits increased step length following task-specific activities with good carryover to gait around therapy gym. However, at end of session when patient leaving she does revert back to taking small, short steps during gait. Pt continues to benefit from skilled PT services to work towards LTGs. Continue POC.    OBJECTIVE IMPAIRMENTS: Abnormal gait, decreased balance, decreased cognition, decreased mobility, difficulty walking, decreased strength, and decreased safety  awareness.   ACTIVITY LIMITATIONS: carrying, lifting, bending, squatting, stairs, and bed mobility  PARTICIPATION LIMITATIONS: meal prep, cleaning, driving, and community activity  PERSONAL FACTORS: Age, Fitness, Time since onset of injury/illness/exacerbation, and 3+ comorbidities:    hypertension, hyperlipidemia, CKD 3, LBBB, obesity, and stroke in 2023 are also affecting patient's functional outcome.   REHAB POTENTIAL: Good  CLINICAL DECISION MAKING: Evolving/moderate complexity  EVALUATION COMPLEXITY: Moderate  PLAN:  PT FREQUENCY: 1-2x/week  PT DURATION: 6 weeks  PLANNED INTERVENTIONS: 97164- PT Re-evaluation, 97110-Therapeutic exercises, 97530- Therapeutic activity, [id number]- Neuromuscular re-education, 97535- Self Care, 20254- Manual therapy, [id number]- Gait training, [phone]- Orthotic Fit/training, [phone]- Electrical stimulation (manual), Patient/Family education, Balance training, Stair training, DME instructions, Cryotherapy, and Moist heat  PLAN FOR NEXT SESSION:  How is? HEP, work on functional strengthening and dynamic balance, increasing step length and step height (increase hip and knee flexion), dynamic balance, staggered sit to stands?, add weights/resistance around LE   Peter Congo, PT Peter Congo, PT, DPT, CSRS   07/24/2023, 9:36 AM

## 2023-07-25 NOTE — Addendum Note (Signed)
 Addended by: Geralyn Flash D on: 07/25/2023 03:16 PM   Modules accepted: Orders

## 2023-07-25 NOTE — Progress Notes (Signed)
 [facility] Summary Report / Loop Recorder

## 2023-07-31 ENCOUNTER — Ambulatory Visit: Attending: Registered Nurse | Admitting: Physical Therapy

## 2023-07-31 VITALS — BP 176/92 | HR 86

## 2023-07-31 DIAGNOSIS — R2681 Unsteadiness on feet: Secondary | ICD-10-CM | POA: Diagnosis present

## 2023-07-31 DIAGNOSIS — M6281 Muscle weakness (generalized): Secondary | ICD-10-CM | POA: Diagnosis present

## 2023-07-31 DIAGNOSIS — R2689 Other abnormalities of gait and mobility: Secondary | ICD-10-CM | POA: Diagnosis present

## 2023-07-31 NOTE — Therapy (Signed)
 OUTPATIENT PHYSICAL THERAPY NEURO TREATMENT   Patient Name: Heather Gross MRN: [id number] DOB:Jul 05, 1945, 78 y.o., female Today's Date: 07/31/2023   PCP: Loura Back, NP REFERRING PROVIDER: Loura Back, NP  END OF SESSION:  PT End of Session - 07/31/23 0852     Visit Number 4    Number of Visits 13   with eval   Date for PT Re-Evaluation 09/04/23    Authorization Type [employer]    PT Start Time 0849    PT Stop Time 0930    PT Time Calculation (min) 41 min    Equipment Utilized During Treatment Gait belt    Activity Tolerance Patient tolerated treatment well    Behavior During Therapy WFL for tasks assessed/performed                Past Medical History:  Diagnosis Date   Diabetes mellitus without complication (HCC)    Glaucoma    Hyperlipidemia    Hypertension    Stroke [facility])    Past Surgical History:  Procedure Laterality Date   BREAST BIOPSY Right 09/29/2015   BREAST EXCISIONAL BIOPSY Right 11/04/2015   papilloma   BREAST LUMPECTOMY WITH RADIOACTIVE SEED LOCALIZATION Right 11/04/2015   Procedure: BREAST LUMPECTOMY WITH RADIOACTIVE SEED LOCALIZATION;  Surgeon: Chevis Pretty III, MD;  Location: [facility];  Service: General;  Laterality: Right;   LIPOMA EXCISION     Patient Active Problem List   Diagnosis Date Noted   AKI (acute kidney injury) (HCC) 09/28/2022   Family history of malignant neoplasm of digestive organs 10/28/2021   Cerebrovascular disease 09/13/2021   Acute ischemic stroke (HCC) 09/01/2021   LBBB (left bundle branch block) 09/01/2021   Obesity (BMI 30-39.9) 12/30/2019   Stage 3a chronic kidney disease (CKD) (HCC) -Baseline Scr 1.3-1.6 02/05/2017   Osteopenia 11/02/2016   Vitamin D deficiency 12/10/2014   Hyperlipidemia 06/11/2014   Type 2 diabetes mellitus with diabetic chronic kidney disease (HCC)    Glaucoma    Essential hypertension 06/28/2006   Gastroesophageal reflux disease 06/28/2006   ECZEMA, ATOPIC DERMATITIS  06/28/2006    ONSET DATE: 07/04/2023 (referral date)  REFERRING DIAG: M85.80 (ICD-10-CM) - Other specified disorders of bone density and structure, unspecified site  THERAPY DIAG:  Muscle weakness (generalized)  Unsteadiness on feet  Other abnormalities of gait and mobility  Rationale for Evaluation and Treatment: Rehabilitation  SUBJECTIVE:                                                                                                                                                                                             SUBJECTIVE STATEMENT: Pt  denies any acute changes since last visit, no falls. Left leg is painful this morning, thinks she slept wrong. Pt reports she has been doing her HEP, husband reports otherwise. Just took her BP meds.   Pt accompanied by: significant other husband [name]  PERTINENT HISTORY: hypertension, hyperlipidemia, CKD 3, LBBB, obesity, and stroke in 2023  PAIN:  Are you having pain? Yes: NPRS scale: 8/10 Pain location: LLE Pain description: achy  PRECAUTIONS: Fall and Other: loop recorder  RED FLAGS: None   WEIGHT BEARING RESTRICTIONS: No  FALLS: Has patient fallen in last 6 months? Yes. Number of falls one fall, no injuries  LIVING ENVIRONMENT: Lives with: lives with their family (husband [name]) Lives in: House/apartment Stairs: Yes: External: 1 steps; none Has following equipment at home:  hurricane  PLOF: Independent with gait, Independent with transfers, and Requires assistive device for independence  PATIENT GOALS: "just my balance"  OBJECTIVE:  Note: Objective measures were completed at Evaluation unless otherwise noted.  DIAGNOSTIC FINDINGS: None relevant to this POC  COGNITION: Overall cognitive status: History of cognitive impairments - at baseline, memory impairments prior to her stroke   SENSATION: WFL  POSTURE: rounded shoulders, forward head, and posterior pelvic tilt  LOWER EXTREMITY ROM:   WFL  Active   Right Eval Left Eval  Hip flexion    Hip extension    Hip abduction    Hip adduction    Hip internal rotation    Hip external rotation    Knee flexion    Knee extension    Ankle dorsiflexion    Ankle plantarflexion    Ankle inversion    Ankle eversion     (Blank rows = not tested)  LOWER EXTREMITY MMT:    MMT Right Eval Left Eval  Hip flexion 4+ 4+  Hip extension    Hip abduction 4 4  Hip adduction 3 3  Hip internal rotation    Hip external rotation    Knee flexion 5 5  Knee extension 5 5  Ankle dorsiflexion 5 5  Ankle plantarflexion    Ankle inversion    Ankle eversion    (Blank rows = not tested)  BED MOBILITY:  Mod I per patient report  TRANSFERS: Assistive device utilized: None  Sit to stand: Modified independence Stand to sit: Modified independence Chair to chair: Modified independence Floor:  not assessed at eval   STAIRS: Level of Assistance: Min A Stair Negotiation Technique: Step to Pattern Alternating Pattern  with Bilateral Rails Number of Stairs: 4  Height of Stairs: 6  Comments: transitions from step through to step to when descending  GAIT: Gait pattern: decreased hip/knee flexion- Right, decreased hip/knee flexion- Left, decreased ankle dorsiflexion- Right, decreased ankle dorsiflexion- Left, and shuffling Distance walked: various clinic distances Assistive device utilized:  occasional use of hurrycane and None Level of assistance: CGA Comments: shuffles feet, decreased BLE clearance                          VITALS  Vitals:   07/31/23 0857 07/31/23 0911  BP: (!) 160/79 (!) 176/92  Pulse: 86 86  TREATMENT:   Self-Care Assessed vitals in LUE (see above) and systolic too elevated for PT. Husband reports they forgot her meds and had to turn around to get them this morning, so pt has not had them in her system long. Reassessed BP while holding LUE  and BP within limits for PT   Ther Act  SciFit multi-peaks level 8 for 8 minutes using BUE/BLEs for neural priming for reciprocal movement, dynamic cardiovascular warmup and increased amplitude of stepping. Pt rated pain in LLE as 0/10 following activity. Reassessed BP following activity and BP still within limits for therapy.   Physical Performance   [facility] PT Assessment - 07/31/23 0912       Functional Gait  Assessment   Gait assessed  Yes    Gait Level Surface Walks 20 ft, slow speed, abnormal gait pattern, evidence for imbalance or deviates 10-15 in outside of the 12 in walkway width. Requires more than 7 sec to ambulate 20 ft.   15.66s   Change in Gait Speed Makes only minor adjustments to walking speed, or accomplishes a change in speed with significant gait deviations, deviates 10-15 in outside the 12 in walkway width, or changes speed but loses balance but is able to recover and continue walking.    Gait with Horizontal Head Turns Performs head turns with moderate changes in gait velocity, slows down, deviates 10-15 in outside 12 in walkway width but recovers, can continue to walk.    Gait with Vertical Head Turns Performs task with slight change in gait velocity (eg, minor disruption to smooth gait path), deviates 6 - 10 in outside 12 in walkway width or uses assistive device    Gait and Pivot Turn Turns slowly, requires verbal cueing, or requires several small steps to catch balance following turn and stop    Step Over Obstacle Is able to step over one shoe box (4.5 in total height) but must slow down and adjust steps to clear box safely. May require verbal cueing.   Very slow, came to full stop prior to stepping   Gait with Narrow Base of Support Ambulates less than 4 steps heel to toe or cannot perform without assistance.    Gait with Eyes Closed Cannot walk 20 ft without assistance, severe gait deviations or imbalance, deviates greater than 15 in outside 12 in walkway width or will not  attempt task.   Severe lateral drift to R   Ambulating Backwards Walks 20 ft, slow speed, abnormal gait pattern, evidence for imbalance, deviates 10-15 in outside 12 in walkway width.   Lateral deviation to R   Steps Alternating feet, must use rail.    Total Score 10    FGA comment: High fall risk            Discussed results of FGA and deficits noted by exam. Pt appeared more unstable today compared to previous session, husband reports he noticed this too. Strongly recommended pt work on walking at home to see improvements in balance. Pt verbalized understanding.   PATIENT EDUCATION: Education details: continue HEP, work on walking at home, results of FGA  Person educated: Patient and Spouse Education method: Explanation, Facilities manager, and Verbal cues Education comprehension: verbalized understanding, returned demonstration, verbal cues required, and needs further education  HOME EXERCISE PROGRAM:  Access Code: [email or web address] URL: [email or web address] Date: 07/17/2023 Prepared by: Alethia Berthold Damarion Mendizabal  Exercises - Side Stepping with Resistance at Ankles and Counter Support  - 1 x daily - 7 x weekly - 1 sets -  10 reps - Step-Over  - 1 x daily - 7 x weekly - 3 sets - 10 reps - Forward Monster Walk with Resistance at Ankles and Counter Support  - 1 x daily - 7 x weekly - 3 sets - 10 reps - Side Step Over  - 1 x daily - 7 x weekly - 3 sets - 10 reps  GOALS: Goals reviewed with patient? Yes  SHORT TERM GOALS: Target date: 07/31/2023  Pt will be independent with assist from her spouse for initial HEP for improved strength, balance, transfers and gait. Baseline: Pt reports she does them when husband is not around, husband reports she does not do them so unable to fully assess goal  Goal status: PARTIALLY MET  2.  Pt will improve 5 x STS to less than or equal to 25 seconds to demonstrate improved functional strength and transfer efficiency.  Baseline: 29.06 sec no UE (3/11) Goal  status: INITIAL  3.  Pt will improve gait velocity to at least 1.6 ft/sec for improved gait efficiency and performance at CGA level  Baseline: 1.3 ft/sec with hurrycane CGA (3/11) Goal status: INITIAL  4.  Pt will improve normal TUG to less than or equal to 20 seconds for improved functional mobility and decreased fall risk. Baseline: 24.75 sec with hurrycane and CGA (3/11) Goal status: INITIAL  5.  Pt will improve FGA to 15/30 for decreased fall risk  Baseline: 11/30 (3/11); 10/30 (4/1) Goal status: NOT MET    LONG TERM GOALS: Target date: 08/21/2023   Pt will be independent with assist from her spouse final HEP for improved strength, balance, transfers and gait. Baseline:  Goal status: INITIAL  2.  Pt will improve 5 x STS to less than or equal to 20 seconds to demonstrate improved functional strength and transfer efficiency.  Baseline: 29.06 sec no UE (3/11) Goal status: INITIAL  3.  Pt will improve gait velocity to at least 1.9 ft/sec for improved gait efficiency and performance at Supervision level  Baseline: 1.3 ft/sec with hurrycane CGA (3/11) Goal status: INITIAL  4.  Pt will improve normal TUG to less than or equal to 16 seconds for improved functional mobility and decreased fall risk. Baseline: 24.75 sec with hurrycane and CGA (3/11) Goal status: INITIAL  5.  Pt will improve FGA to 15/30 for decreased fall risk  Baseline: 11/30 (3/11); 10/30 (4/1) Goal status: REVISED    ASSESSMENT:  CLINICAL IMPRESSION: Emphasis of skilled PT session on STG assessment. Session limited due to pt's elevated BP at beginning of session due to taking BP meds late, but did stay within limits for therapy after several minutes of seated rest. Pt has not met any STGs this date, scoring a 10/30 on FGA and reporting mix of compliance w/HEP. Pt appeared much more unstable w/gait this date, moving more slowly and decreased weight acceptance on RLE. Husband noted pt not moving well today.  Strongly encouraged compliance w/HEP at home w/emphasis on walking to work on functional strength and endurance. Continue POC.    OBJECTIVE IMPAIRMENTS: Abnormal gait, decreased balance, decreased cognition, decreased mobility, difficulty walking, decreased strength, and decreased safety awareness.   ACTIVITY LIMITATIONS: carrying, lifting, bending, squatting, stairs, and bed mobility  PARTICIPATION LIMITATIONS: meal prep, cleaning, driving, and community activity  PERSONAL FACTORS: Age, Fitness, Time since onset of injury/illness/exacerbation, and 3+ comorbidities:    hypertension, hyperlipidemia, CKD 3, LBBB, obesity, and stroke in 2023 are also affecting patient's functional outcome.   REHAB POTENTIAL: Good  CLINICAL DECISION MAKING: Evolving/moderate complexity  EVALUATION COMPLEXITY: Moderate  PLAN:  PT FREQUENCY: 1-2x/week  PT DURATION: 6 weeks  PLANNED INTERVENTIONS: 97164- PT Re-evaluation, 97110-Therapeutic exercises, 97530- Therapeutic activity, [id number]- Neuromuscular re-education, 97535- Self Care, 16109- Manual therapy, [phone]- Gait training, [phone]- Orthotic Fit/training, [phone]- Electrical stimulation (manual), Patient/Family education, Balance training, Stair training, DME instructions, Cryotherapy, and Moist heat  PLAN FOR NEXT SESSION:  Finish STG. How is HEP? work on functional strengthening and dynamic balance, increasing step length and step height (increase hip and knee flexion), dynamic balance, staggered sit to stands?, add weights/resistance around LE   Weslynn Ke E Donevan Biller, PT, DPT 07/31/2023, 10:11 AM

## 2023-08-07 ENCOUNTER — Ambulatory Visit: Admitting: Physical Therapy

## 2023-08-14 ENCOUNTER — Ambulatory Visit: Admitting: Physical Therapy

## 2023-08-21 ENCOUNTER — Ambulatory Visit: Admitting: Physical Therapy

## 2023-08-21 VITALS — BP 154/91 | HR 88

## 2023-08-21 DIAGNOSIS — M6281 Muscle weakness (generalized): Secondary | ICD-10-CM

## 2023-08-21 DIAGNOSIS — R2689 Other abnormalities of gait and mobility: Secondary | ICD-10-CM

## 2023-08-21 DIAGNOSIS — R2681 Unsteadiness on feet: Secondary | ICD-10-CM

## 2023-08-21 NOTE — Therapy (Signed)
 OUTPATIENT PHYSICAL THERAPY NEURO TREATMENT- DISCHARGE SUMMARY   Patient Name: Heather Gross MRN: [id number] DOB:11/23/1945, 78 y.o., female Today's Date: 08/21/2023   PCP: Heather Lose, NP REFERRING PROVIDER: Hershell Lose, NP  PHYSICAL THERAPY DISCHARGE SUMMARY  Visits from Start of Care: 5  Current functional level related to goals / functional outcomes: Pt requires SBA w/use of hurrycane at all times    Remaining deficits: High fall risk, decreased activity tolerance, global weakness, impaired cognition and decreased safety awareness    Education / Equipment: HEP   Patient agrees to discharge. Patient goals were partially met. Patient is being discharged due to financial reasons.   END OF SESSION:  PT End of Session - 08/21/23 1023     Visit Number 5    Number of Visits 13   with eval   Date for PT Re-Evaluation 09/04/23    Authorization Type [employer]    PT Start Time 1020    PT Stop Time 1044   DC   PT Time Calculation (min) 24 min    Equipment Utilized During Treatment --    Activity Tolerance Patient tolerated treatment well    Behavior During Therapy WFL for tasks assessed/performed                 Past Medical History:  Diagnosis Date   Diabetes mellitus without complication (HCC)    Glaucoma    Hyperlipidemia    Hypertension    Stroke [facility])    Past Surgical History:  Procedure Laterality Date   BREAST BIOPSY Right 09/29/2015   BREAST EXCISIONAL BIOPSY Right 11/04/2015   papilloma   BREAST LUMPECTOMY WITH RADIOACTIVE SEED LOCALIZATION Right 11/04/2015   Procedure: BREAST LUMPECTOMY WITH RADIOACTIVE SEED LOCALIZATION;  Surgeon: Lillette Reid III, MD;  Location: [facility];  Service: General;  Laterality: Right;   LIPOMA EXCISION     Patient Active Problem List   Diagnosis Date Noted   AKI (acute kidney injury) (HCC) 09/28/2022   Family history of malignant neoplasm of digestive organs 10/28/2021   Cerebrovascular disease  09/13/2021   Acute ischemic stroke (HCC) 09/01/2021   LBBB (left bundle branch block) 09/01/2021   Obesity (BMI 30-39.9) 12/30/2019   Stage 3a chronic kidney disease (CKD) (HCC) -Baseline Scr 1.3-1.6 02/05/2017   Osteopenia 11/02/2016   Vitamin D  deficiency 12/10/2014   Hyperlipidemia 06/11/2014   Type 2 diabetes mellitus with diabetic chronic kidney disease (HCC)    Glaucoma    Essential hypertension 06/28/2006   Gastroesophageal reflux disease 06/28/2006   ECZEMA, ATOPIC DERMATITIS 06/28/2006    ONSET DATE: 07/04/2023 (referral date)  REFERRING DIAG: M85.80 (ICD-10-CM) - Other specified disorders of bone density and structure, unspecified site  THERAPY DIAG:  Muscle weakness (generalized)  Unsteadiness on feet  Other abnormalities of gait and mobility  Rationale for Evaluation and Treatment: Rehabilitation  SUBJECTIVE:  SUBJECTIVE STATEMENT: Pt reports doing well. Has not been doing her HEP, lost her sheet. Denies falls or pain. Thinks she is moving better and plans to start walking at the park   Pt accompanied by: significant other husband Heather Gross  PERTINENT HISTORY: hypertension, hyperlipidemia, CKD 3, LBBB, obesity, and stroke in 2023  PAIN:  Are you having pain? No  PRECAUTIONS: Fall and Other: loop recorder  RED FLAGS: None   WEIGHT BEARING RESTRICTIONS: No  FALLS: Has patient fallen in last 6 months? Yes. Number of falls one fall, no injuries  LIVING ENVIRONMENT: Lives with: lives with their family (husband Heather Gross) Lives in: House/apartment Stairs: Yes: External: 1 steps; none Has following equipment at home:  hurricane  PLOF: Independent with gait, Independent with transfers, and Requires assistive device for independence  PATIENT GOALS: "just my balance"  OBJECTIVE:   Note: Objective measures were completed at Evaluation unless otherwise noted.  DIAGNOSTIC FINDINGS: None relevant to this POC  COGNITION: Overall cognitive status: History of cognitive impairments - at baseline, memory impairments prior to her stroke   SENSATION: WFL  POSTURE: rounded shoulders, forward head, and posterior pelvic tilt  LOWER EXTREMITY ROM:   WFL  Active  Right Eval Left Eval  Hip flexion    Hip extension    Hip abduction    Hip adduction    Hip internal rotation    Hip external rotation    Knee flexion    Knee extension    Ankle dorsiflexion    Ankle plantarflexion    Ankle inversion    Ankle eversion     (Blank rows = not tested)  LOWER EXTREMITY MMT:    MMT Right Eval Left Eval  Hip flexion 4+ 4+  Hip extension    Hip abduction 4 4  Hip adduction 3 3  Hip internal rotation    Hip external rotation    Knee flexion 5 5  Knee extension 5 5  Ankle dorsiflexion 5 5  Ankle plantarflexion    Ankle inversion    Ankle eversion    (Blank rows = not tested)  BED MOBILITY:  Mod I per patient report  TRANSFERS: Assistive device utilized: None  Sit to stand: Modified independence Stand to sit: Modified independence Chair to chair: Modified independence Floor:  not assessed at eval   STAIRS: Level of Assistance: Min A Stair Negotiation Technique: Step to Pattern Alternating Pattern  with Bilateral Rails Number of Stairs: 4  Height of Stairs: 6  Comments: transitions from step through to step to when descending  GAIT: Gait pattern: decreased hip/knee flexion- Right, decreased hip/knee flexion- Left, decreased ankle dorsiflexion- Right, decreased ankle dorsiflexion- Left, and shuffling Distance walked: various clinic distances Assistive device utilized:  occasional use of hurrycane and None Level of assistance: CGA Comments: shuffles feet, decreased BLE clearance                          VITALS  Vitals:   08/21/23 1027  BP: (!)  154/91  Pulse: 88  TREATMENT:   Self-Care Assessed vitals in LUE (see above) and WNL Verbally reviewed HEP and added sit to stands to it (see bolded below). Encouraged pt and husband to make a schedule that they can stick to as pt is going to be most successful if she is consistent. Pt and husband verbalized understanding.    Physical Performance  [facility] PT Assessment - 08/21/23 1032       Ambulation/Gait   Gait velocity 32.8' over 18.75s = 1.75 ft/s   w/hurrycane and SBA     Standardized Balance Assessment   Five times sit to stand comments  16.97s   BUE support     Timed Up and Go Test   Normal TUG (seconds) 18.31   w/hurrycane and SBA, increased difficulty turning to L           Discussed results of goals (limited as pt has cancelled several appointments) and pt has improved her walking speed and strength since eval. Encouraged pt to return to PT in future if mobility needs or balance changes, pt and husband verbalized understanding.   PATIENT EDUCATION: Education details: final HEP, goal results  Person educated: Patient and Spouse Education method: Explanation, Demonstration, and Handouts Education comprehension: verbalized understanding and returned demonstration  HOME EXERCISE PROGRAM:  Access Code: [email or web address] URL: [email or web address] Date: 07/17/2023 Prepared by: Burleigh Carp Mandrell Vangilder  Exercises - Side Stepping with Resistance at Ankles and Counter Support  - 1 x daily - 7 x weekly - 1 sets - 10 reps - Step-Over  - 1 x daily - 7 x weekly - 3 sets - 10 reps - Forward Monster Walk with Resistance at Ankles and Counter Support  - 1 x daily - 7 x weekly - 3 sets - 10 reps - Side Step Over  - 1 x daily - 7 x weekly - 3 sets - 10 reps - Sit to Stand Without Arm Support  - 1 x daily - 7 x weekly - 3 sets - 10 reps  GOALS: Goals reviewed with patient? Yes  SHORT TERM  GOALS: Target date: 07/31/2023  Pt will be independent with assist from her spouse for initial HEP for improved strength, balance, transfers and gait. Baseline: Pt reports she does them when husband is not around, husband reports she does not do them so unable to fully assess goal  Goal status: PARTIALLY MET  2.  Pt will improve 5 x STS to less than or equal to 25 seconds to demonstrate improved functional strength and transfer efficiency.  Baseline: 29.06 sec no UE (3/11) Goal status: INITIAL  3.  Pt will improve gait velocity to at least 1.6 ft/sec for improved gait efficiency and performance at CGA level  Baseline: 1.3 ft/sec with hurrycane CGA (3/11) Goal status: INITIAL  4.  Pt will improve normal TUG to less than or equal to 20 seconds for improved functional mobility and decreased fall risk. Baseline: 24.75 sec with hurrycane and CGA (3/11) Goal status: INITIAL  5.  Pt will improve FGA to 15/30 for decreased fall risk  Baseline: 11/30 (3/11); 10/30 (4/1) Goal status: NOT MET    LONG TERM GOALS: Target date: 08/21/2023   Pt will be independent with assist from her spouse final HEP for improved strength, balance, transfers and gait. Baseline:  Goal status: MET  2.  Pt will improve 5 x STS to less than or equal to 20 seconds to demonstrate improved functional strength and transfer efficiency.  Baseline: 29.06 sec no UE (3/11); 16.97s w/BUE  support  Goal status: MET  3.  Pt will improve gait velocity to at least 1.9 ft/sec for improved gait efficiency and performance at Supervision level  Baseline: 1.3 ft/sec with hurrycane CGA (3/11); 1.70ft/s w/hurrycane and SBA  Goal status: PARTIALLY MET   4.  Pt will improve normal TUG to less than or equal to 16 seconds for improved functional mobility and decreased fall risk. Baseline: 24.75 sec with hurrycane and CGA (3/11); 18.31s w/hurrycane and SBA Goal status: PARTIALLY MET  5.  Pt will improve FGA to 15/30 for decreased fall  risk  Baseline: 11/30 (3/11); 10/30 (4/1) Goal status: NOT MET    ASSESSMENT:  CLINICAL IMPRESSION: Emphasis of skilled PT session on LTG assessment and DC from PT. Due to financial reasons, pt only able to come to PT 2x/month. Informed pt and husband that pt will be more successful if she works on her exercises daily at home and pt in agreement with this. Pt has met 2 of 5 goals, w/pt and husband verbalizing understanding of final HEP and improving her 5x STS time. Pt did demonstrate some retropulsion on first rep of 5x STS and required BUE support to perform, so added sit to stands to HEP to work on this at home. Pt has improved both her gait speed and time on TUG w/use of hurrycane, indicative of improved functional strength and endurance, but narrowly missed her goal level. Pt did not meet her FGA goal and has not improved much since eval, but progress is limited as pt coming to therapy very infrequently. Educated pt and husband on importance of pt moving at home and being consistence w/walking and HEP for maintained function and reduced fall risk. Encouraged pt to return to PT if her balance begins to decline. Pt and husband in agreement to DC from PT this date due to high copay and need to work on exercises at home.      OBJECTIVE IMPAIRMENTS: Abnormal gait, decreased balance, decreased cognition, decreased mobility, difficulty walking, decreased strength, and decreased safety awareness.   ACTIVITY LIMITATIONS: carrying, lifting, bending, squatting, stairs, and bed mobility  PARTICIPATION LIMITATIONS: meal prep, cleaning, driving, and community activity  PERSONAL FACTORS: Age, Fitness, Time since onset of injury/illness/exacerbation, and 3+ comorbidities:    hypertension, hyperlipidemia, CKD 3, LBBB, obesity, and stroke in 2023 are also affecting patient's functional outcome.   REHAB POTENTIAL: Good  CLINICAL DECISION MAKING: Evolving/moderate complexity  EVALUATION COMPLEXITY:  Moderate  PLAN:  PT FREQUENCY: 1-2x/week  PT DURATION: 6 weeks  PLANNED INTERVENTIONS: 97164- PT Re-evaluation, 97110-Therapeutic exercises, 97530- Therapeutic activity, 97112- Neuromuscular re-education, 97535- Self Care, 21308- Manual therapy, [phone]- Gait training, [phone]- Orthotic Fit/training, [phone]- Electrical stimulation (manual), Patient/Family education, Balance training, Stair training, DME instructions, Cryotherapy, and Moist heat   Kincaid Tiger E Ayaana Biondo, PT, DPT 08/21/2023, 10:48 AM

## 2023-08-27 ENCOUNTER — Ambulatory Visit (INDEPENDENT_AMBULATORY_CARE_PROVIDER_SITE_OTHER): Payer: Medicare HMO

## 2023-08-27 DIAGNOSIS — I639 Cerebral infarction, unspecified: Secondary | ICD-10-CM | POA: Diagnosis not present

## 2023-08-27 LAB — CUP PACEART REMOTE DEVICE CHECK
Date Time Interrogation Session: 20250427231335
Implantable Pulse Generator Implant Date: 20230825

## 2023-09-07 NOTE — Progress Notes (Signed)
 [facility] Summary Report / Loop Recorder

## 2023-10-01 ENCOUNTER — Ambulatory Visit (INDEPENDENT_AMBULATORY_CARE_PROVIDER_SITE_OTHER)

## 2023-10-01 ENCOUNTER — Ambulatory Visit: Payer: Self-pay | Admitting: Cardiology

## 2023-10-01 DIAGNOSIS — I639 Cerebral infarction, unspecified: Secondary | ICD-10-CM

## 2023-10-01 LAB — CUP PACEART REMOTE DEVICE CHECK
Date Time Interrogation Session: 20250601232530
Implantable Pulse Generator Implant Date: 20230825

## 2023-10-16 NOTE — Progress Notes (Signed)
 [facility] Summary Report / Loop Recorder

## 2023-10-20 ENCOUNTER — Other Ambulatory Visit: Payer: Self-pay

## 2023-10-20 ENCOUNTER — Encounter (HOSPITAL_COMMUNITY): Payer: Self-pay

## 2023-10-20 ENCOUNTER — Emergency Department (HOSPITAL_COMMUNITY)
Admission: EM | Admit: 2023-10-20 | Discharge: 2023-10-20 | Disposition: A | Discharge status: Home / Self Care | Attending: Emergency Medicine | Admitting: Emergency Medicine

## 2023-10-20 DIAGNOSIS — Z8673 Personal history of transient ischemic attack (TIA), and cerebral infarction without residual deficits: Secondary | ICD-10-CM | POA: Diagnosis not present

## 2023-10-20 DIAGNOSIS — N390 Urinary tract infection, site not specified: Secondary | ICD-10-CM | POA: Diagnosis not present

## 2023-10-20 DIAGNOSIS — E1165 Type 2 diabetes mellitus with hyperglycemia: Secondary | ICD-10-CM | POA: Insufficient documentation

## 2023-10-20 DIAGNOSIS — Z91040 Latex allergy status: Secondary | ICD-10-CM | POA: Diagnosis not present

## 2023-10-20 DIAGNOSIS — R300 Dysuria: Secondary | ICD-10-CM | POA: Diagnosis present

## 2023-10-20 DIAGNOSIS — N189 Chronic kidney disease, unspecified: Secondary | ICD-10-CM | POA: Diagnosis not present

## 2023-10-20 DIAGNOSIS — Z7901 Long term (current) use of anticoagulants: Secondary | ICD-10-CM | POA: Diagnosis not present

## 2023-10-20 DIAGNOSIS — I129 Hypertensive chronic kidney disease with stage 1 through stage 4 chronic kidney disease, or unspecified chronic kidney disease: Secondary | ICD-10-CM | POA: Insufficient documentation

## 2023-10-20 DIAGNOSIS — Z79899 Other long term (current) drug therapy: Secondary | ICD-10-CM | POA: Diagnosis not present

## 2023-10-20 DIAGNOSIS — Z7902 Long term (current) use of antithrombotics/antiplatelets: Secondary | ICD-10-CM | POA: Diagnosis not present

## 2023-10-20 DIAGNOSIS — E1122 Type 2 diabetes mellitus with diabetic chronic kidney disease: Secondary | ICD-10-CM | POA: Diagnosis not present

## 2023-10-20 LAB — CBC WITH DIFFERENTIAL/PLATELET
Abs Immature Granulocytes: 0.02 10*3/uL (ref 0.00–0.07)
Basophils Absolute: 0.1 10*3/uL (ref 0.0–0.1)
Basophils Relative: 1 %
Eosinophils Absolute: 0.1 10*3/uL (ref 0.0–0.5)
Eosinophils Relative: 1 %
HCT: 39 % (ref 36.0–46.0)
Hemoglobin: 12.6 g/dL (ref 12.0–15.0)
Immature Granulocytes: 0 %
Lymphocytes Relative: 20 %
Lymphs Abs: 1.9 10*3/uL (ref 0.7–4.0)
MCH: 29.8 pg (ref 26.0–34.0)
MCHC: 32.3 g/dL (ref 30.0–36.0)
MCV: 92.2 fL (ref 80.0–100.0)
Monocytes Absolute: 0.6 10*3/uL (ref 0.1–1.0)
Monocytes Relative: 6 %
Neutro Abs: 7.2 10*3/uL (ref 1.7–7.7)
Neutrophils Relative %: 72 %
Platelets: 169 10*3/uL (ref 150–400)
RBC: 4.23 MIL/uL (ref 3.87–5.11)
RDW: 13.4 % (ref 11.5–15.5)
WBC: 9.9 10*3/uL (ref 4.0–10.5)
nRBC: 0 % (ref 0.0–0.2)

## 2023-10-20 LAB — URINALYSIS, ROUTINE W REFLEX MICROSCOPIC
Bilirubin Urine: NEGATIVE
Glucose, UA: NEGATIVE mg/dL
Ketones, ur: NEGATIVE mg/dL
Nitrite: NEGATIVE
Protein, ur: 100 mg/dL — AB
RBC / HPF: >50 RBC/hpf (ref 0–5)
Specific Gravity, Urine: 1.011 (ref 1.005–1.030)
WBC, UA: >50 WBC/hpf (ref 0–5)
pH: 7 (ref 5.0–8.0)

## 2023-10-20 LAB — CBG MONITORING, ED: Glucose-Capillary: 120 mg/dL — ABNORMAL HIGH (ref 70–99)

## 2023-10-20 LAB — COMPREHENSIVE METABOLIC PANEL WITH GFR
ALT: 28 U/L (ref 0–44)
AST: 24 U/L (ref 15–41)
Albumin: 4 g/dL (ref 3.5–5.0)
Alkaline Phosphatase: 50 U/L (ref 38–126)
Anion gap: 8 (ref 5–15)
BUN: 13 mg/dL (ref 8–23)
CO2: 26 mmol/L (ref 22–32)
Calcium: 9.6 mg/dL (ref 8.9–10.3)
Chloride: 107 mmol/L (ref 98–111)
Creatinine, Ser: 1.24 mg/dL — ABNORMAL HIGH (ref 0.44–1.00)
GFR, Estimated: 45 mL/min — ABNORMAL LOW (ref >60)
Glucose, Bld: 124 mg/dL — ABNORMAL HIGH (ref 70–99)
Potassium: 3.8 mmol/L (ref 3.5–5.1)
Sodium: 141 mmol/L (ref 135–145)
Total Bilirubin: 0.8 mg/dL (ref 0.0–1.2)
Total Protein: 7.2 g/dL (ref 6.5–8.1)

## 2023-10-20 LAB — LIPASE, BLOOD: Lipase: 32 U/L (ref 11–51)

## 2023-10-20 MED ORDER — METOPROLOL TARTRATE 25 MG PO TABS
25.0000 mg | ORAL_TABLET | Freq: Once | ORAL | Status: AC
Start: 2023-10-20 — End: 2023-10-20
  Administered 2023-10-20: 25 mg via ORAL
  Filled 2023-10-20: qty 1

## 2023-10-20 MED ORDER — CEPHALEXIN 500 MG PO CAPS: 500.0000 mg | ORAL_CAPSULE | Freq: Four times a day (QID) | ORAL | 0 refills | Status: AC

## 2023-10-20 NOTE — ED Notes (Signed)
 Pt cleared for d/c by PA. Delay in d/c due to PA requesting to medicate pt for HTN prior to d/c.

## 2023-10-20 NOTE — Discharge Instructions (Addendum)
 You have symptoms consistent with a urinary tract infection.  Please take antibiotic as prescribed for the full duration.  Follow-up with your doctor for further care.  Your blood pressure is elevated today, please have it rechecked by your primary care doctor.

## 2023-10-20 NOTE — ED Triage Notes (Signed)
 Complains of frequent urination and burning with urination.  Reports symptoms started last night.  Denies n/v fever.

## 2023-10-20 NOTE — ED Notes (Signed)
Pt unable to provide UA at this time

## 2023-10-20 NOTE — ED Notes (Signed)
 Pt provided water and encouraged to press call light when ready to provide UA.

## 2023-10-20 NOTE — ED Provider Notes (Signed)
 [place] EMERGENCY DEPARTMENT AT [facility] Provider Note   CSN: [id number] Arrival date & time: 10/20/23  1024     Patient presents with: Dysuria   Heather Gross is a 78 y.o. female.   The history is provided by the patient and medical records. No language interpreter was used.  Dysuria    Patient presents today for increased urination. Patient says this problem has been gradually getting worse over the last couple of months. She states she might have seen some blood in her urine yesterday. Patient denies burning on urination, incontinence, abdominal pain, fever, chills, nausea, or vomiting. She states she hasn't changed any medications within a year and doesn't think she has increased her fluid intake.   Prior to Admission medications   Medication Sig Start Date End Date Taking? Authorizing Provider  apixaban  (ELIQUIS ) 5 MG TABS tablet Take 1 tablet (5 mg total) by mouth 2 (two) times daily. 10/13/22   Vannie Reche RAMAN, NP  apixaban  (ELIQUIS ) 5 MG TABS tablet Take 1 tablet (5 mg total) by mouth 2 (two) times daily. 11/13/22   Vannie Reche RAMAN, NP  apixaban  (ELIQUIS ) 5 MG TABS tablet Take 1 tablet (5 mg total) by mouth 2 (two) times daily. 11/27/22   Vannie Reche RAMAN, NP  Calcium  500 MG CHEW Chew 2 tablets (1,000 mg total) by mouth daily. 12/13/16   Melvenia Ronal NOVAK, PA-C  clopidogrel  (PLAVIX ) 75 MG tablet Take 1 tablet (75 mg total) by mouth daily. Start on December 11, 2021 Patient not taking: Reported on 11/27/2022 12/11/21   Pokhrel, Laxman, MD  [employer] D3 2000 units CAPS TAKE 1 CAPSULE BY MOUTH EVERY DAY Patient taking differently: Take 2,000 Units by mouth every other day. 12/17/17   Melvenia Ronal NOVAK, PA-C  ezetimibe  (ZETIA ) 10 MG tablet Take 10 mg by mouth daily. 09/14/21   [provider]  fluticasone  (FLONASE ) 50 MCG/ACT nasal spray Place 1-2 sprays into both nostrils daily as needed for allergies or rhinitis. Patient not taking: Reported on 11/27/2022 06/06/19    [provider]  hydrOXYzine  (ATARAX ) 10 MG tablet Take 1 tablet (10 mg total) by mouth 3 (three) times daily as needed for anxiety. Patient not taking: Reported on 11/27/2022 10/01/22   Akula, Vijaya, MD  metoprolol  succinate (TOPROL -XL) 25 MG 24 hr tablet Take 1 tablet (25 mg total) by mouth daily. 10/13/22   Walker, Caitlin S, NP  Multiple Vitamin (MULTIVITAMIN) tablet Take 1 tablet by mouth daily with breakfast.    [provider]  pantoprazole  (PROTONIX ) 20 MG tablet Take 1 tablet (20 mg total) by mouth daily. 09/14/21 11/27/22  DanfordLonni SQUIBB, MD  rosuvastatin  (CRESTOR ) 40 MG tablet Take 40 mg by mouth at bedtime. 09/08/21   [provider]  sodium bicarbonate  650 MG tablet Take 1 tablet (650 mg total) by mouth 2 (two) times daily. 10/01/22   Akula, Vijaya, MD    Allergies: Lipitor [atorvastatin], Shellfish allergy, Shellfish-derived products, and Latex    Review of Systems  Genitourinary:  Positive for dysuria.  All other systems reviewed and are negative.   Updated Vital Signs BP (!) 182/99 (BP Location: Right Arm)   Pulse 64   Temp (!) 97.5 F (36.4 C) (Oral)   Resp 18   Ht 5' 5 (1.651 m)   Wt 88.5 kg   SpO2 100%   BMI 32.45 kg/m   Physical Exam Vitals and nursing note reviewed.  Constitutional:      General: She is not  in acute distress.    Appearance: She is well-developed. She is obese.  HENT:     Head: Atraumatic.   Eyes:     Conjunctiva/sclera: Conjunctivae normal.    Cardiovascular:     Rate and Rhythm: Normal rate and regular rhythm.     Pulses: Normal pulses.     Heart sounds: Normal heart sounds.  Pulmonary:     Effort: Pulmonary effort is normal.     Breath sounds: Normal breath sounds. No wheezing, rhonchi or rales.  Abdominal:     Palpations: Abdomen is soft.     Tenderness: There is no abdominal tenderness. There is no right CVA tenderness or left CVA tenderness.   Musculoskeletal:        General: Normal range of  motion.     Cervical back: Neck supple.   Skin:    Findings: No rash.   Neurological:     Mental Status: She is alert.   Psychiatric:        Mood and Affect: Mood normal.     (all labs ordered are listed, but only abnormal results are displayed) Labs Reviewed  URINALYSIS, ROUTINE W REFLEX MICROSCOPIC - Abnormal; Notable for the following components:      Result Value   APPearance CLOUDY (*)    Hgb urine dipstick LARGE (*)    Protein, ur 100 (*)    Leukocytes,Ua LARGE (*)    Bacteria, UA RARE (*)    All other components within normal limits  COMPREHENSIVE METABOLIC PANEL WITH GFR - Abnormal; Notable for the following components:   Glucose, Bld 124 (*)    Creatinine, Ser 1.24 (*)    GFR, Estimated 45 (*)    All other components within normal limits  CBG MONITORING, ED - Abnormal; Notable for the following components:   Glucose-Capillary 120 (*)    All other components within normal limits  URINE CULTURE  CBC WITH DIFFERENTIAL/PLATELET  LIPASE, BLOOD    EKG: None  Radiology: No results found.   Procedures   Medications Ordered in the ED - No data to display                                  Medical Decision Making Amount and/or Complexity of Data Reviewed Labs: ordered.  Risk Prescription drug management.   BP (!) 182/99 (BP Location: Right Arm)   Pulse 64   Temp (!) 97.5 F (36.4 C) (Oral)   Resp 18   Ht 5' 5 (1.651 m)   Wt 88.5 kg   SpO2 100%   BMI 32.45 kg/m   29:46 AM 78 year old female with significant history of well-controlled diabetes, hypertension, hyperlipidemia, chronic kidney disease, stroke currently on Plavix  and Eliquis  who presents with complaints of increased urinary frequency. Patient says this problem has been gradually getting worse over the last couple of months. She states she might have seen some blood in her urine yesterday. Patient denies burning on urination, incontinence, abdominal pain, fever, chills, nausea, or vomiting.  She states she hasn't changed any medications within a year and doesn't think she has increased her fluid intake.   On exam patient is resting comfortably appears to be in no acute discomfort.  Heart with normal rate and rhythm, lungs are clear her abdomen is soft nontender bowel sounds are present she does not have any CVA tenderness  Her vitals are notable for elevated blood pressure of 182/99.  She  is afebrile no hypoxia.  -Labs ordered, independently viewed and interpreted by me.  Labs remarkable for urinalysis that shows signs consistent with a UTI.  Will treat with Keflex.  The remainder of her labs are reassuring. -The patient was maintained on a cardiac monitor.  I personally viewed and interpreted the cardiac monitored which showed an underlying rhythm of: NSR -Imaging not considered -This patient presents to the ED for concern of dysuria, this involves an extensive number of treatment options, and is a complaint that carries with it a high risk of complications and morbidity.  The differential diagnosis includes UTI, pyelonephritis, cystitis, DKA -Co morbidities that complicate the patient evaluation includes DM, HTN, HLD -Treatment includes reassurance -Reevaluation of the patient after these medicines showed that the patient stayed the same -PCP office notes or outside notes reviewed -Discussion with attending Dr. Cottie -Escalation to admission/observation considered: patients feels much better, is comfortable with discharge, and will follow up with PCP -Prescription medication considered, patient comfortable with keflex -Social Determinant of Health considered which includes hx of tobacco use.       Final diagnoses:  Lower urinary tract infectious disease    ED Discharge Orders          Ordered    cephALEXin (KEFLEX) 500 MG capsule  4 times daily        10/20/23 1431               Nivia Colon, PA-C 10/20/23 1433    Cottie Donnice PARAS, MD 10/20/23 1446

## 2023-10-20 NOTE — ED Notes (Addendum)
 Pt's husband at bedside being verbally aggressive with staff and expressing frustration that they have been here for 4 hours. Husband stated we've had enough and is attempting to take the patient against medical advice. PA-C notified.

## 2023-10-20 NOTE — ED Notes (Signed)
Urine culture added on by lab.

## 2023-10-22 LAB — URINE CULTURE: Culture: >=100000 — AB

## 2023-10-23 ENCOUNTER — Telehealth (HOSPITAL_BASED_OUTPATIENT_CLINIC_OR_DEPARTMENT_OTHER): Payer: Self-pay

## 2023-10-23 NOTE — Telephone Encounter (Signed)
 Post ED Visit - Positive Culture Follow-up  Culture report reviewed by antimicrobial stewardship pharmacist: [facility] [x]  Leonor Bash, [place].D. []  Venetia Gully, Pharm.D., BCPS AQ-ID []  Garrel Crews, Pharm.D., BCPS []  Almarie Lunger, Pharm.D., BCPS []  [place], [address].D., BCPS, AAHIVP []  Rosaline Bihari, Pharm.D., BCPS, AAHIVP []  Vernell Meier, PharmD, BCPS []  Latanya Hint, PharmD, BCPS []  Donald Medley, PharmD, BCPS []  Rocky Bold, PharmD []  Dorothyann Alert, PharmD, BCPS []  Morene Babe, PharmD  [facility] []  Rosaline Edison, PharmD []  Romona Bliss, PharmD []  Dolphus Roller, PharmD []  Veva Seip, Rph []  Vernell Daunt) Leonce, PharmD []  Eva Allis, PharmD []  Rosaline Millet, PharmD []  Iantha Batch, PharmD []  Arvin Gauss, PharmD []  Wanda Hasting, PharmD []  Ronal Rav, PharmD []  Rocky Slade, PharmD []  Bard Jeans, PharmD   Positive urine culture Treated with Cephalexin, organism sensitive to the same and no further patient follow-up is required at this time.  Heather Gross 10/23/2023, 10:09 AM

## 2023-10-29 ENCOUNTER — Other Ambulatory Visit: Payer: Self-pay | Admitting: Registered Nurse

## 2023-10-29 DIAGNOSIS — N390 Urinary tract infection, site not specified: Secondary | ICD-10-CM

## 2023-10-30 ENCOUNTER — Ambulatory Visit
Admission: RE | Admit: 2023-10-30 | Discharge: 2023-10-30 | Disposition: A | Discharge status: Home / Self Care | Source: Ambulatory Visit | Attending: Registered Nurse | Admitting: Registered Nurse

## 2023-10-30 DIAGNOSIS — N390 Urinary tract infection, site not specified: Secondary | ICD-10-CM

## 2023-11-01 ENCOUNTER — Ambulatory Visit

## 2023-11-01 DIAGNOSIS — I639 Cerebral infarction, unspecified: Secondary | ICD-10-CM

## 2023-11-01 LAB — CUP PACEART REMOTE DEVICE CHECK
Date Time Interrogation Session: 20250702232218
Implantable Pulse Generator Implant Date: 20230825

## 2023-11-05 ENCOUNTER — Ambulatory Visit: Payer: Self-pay | Admitting: Cardiology

## 2023-11-07 ENCOUNTER — Other Ambulatory Visit (HOSPITAL_BASED_OUTPATIENT_CLINIC_OR_DEPARTMENT_OTHER): Payer: Self-pay | Admitting: Family

## 2023-11-07 DIAGNOSIS — I480 Paroxysmal atrial fibrillation: Secondary | ICD-10-CM

## 2023-11-07 DIAGNOSIS — Z79899 Other long term (current) drug therapy: Secondary | ICD-10-CM

## 2023-11-20 NOTE — Progress Notes (Signed)
 [facility] Summary Report / Loop Recorder

## 2023-12-03 ENCOUNTER — Ambulatory Visit

## 2023-12-03 DIAGNOSIS — I480 Paroxysmal atrial fibrillation: Secondary | ICD-10-CM | POA: Diagnosis not present

## 2023-12-04 ENCOUNTER — Ambulatory Visit: Payer: Self-pay | Admitting: Cardiology

## 2023-12-04 LAB — CUP PACEART REMOTE DEVICE CHECK
Date Time Interrogation Session: 20250802231309
Implantable Pulse Generator Implant Date: 20230825

## 2023-12-10 ENCOUNTER — Other Ambulatory Visit (HOSPITAL_BASED_OUTPATIENT_CLINIC_OR_DEPARTMENT_OTHER): Payer: Self-pay | Admitting: Family

## 2023-12-10 DIAGNOSIS — I480 Paroxysmal atrial fibrillation: Secondary | ICD-10-CM

## 2023-12-10 DIAGNOSIS — Z79899 Other long term (current) drug therapy: Secondary | ICD-10-CM

## 2024-01-03 ENCOUNTER — Ambulatory Visit (INDEPENDENT_AMBULATORY_CARE_PROVIDER_SITE_OTHER)

## 2024-01-03 DIAGNOSIS — I480 Paroxysmal atrial fibrillation: Secondary | ICD-10-CM | POA: Diagnosis not present

## 2024-01-03 LAB — CUP PACEART REMOTE DEVICE CHECK
Date Time Interrogation Session: 20250903232705
Implantable Pulse Generator Implant Date: 20230825

## 2024-01-04 ENCOUNTER — Ambulatory Visit: Payer: Self-pay | Admitting: Cardiology

## 2024-01-14 NOTE — Progress Notes (Signed)
 Remote Loop Recorder Transmission

## 2024-01-28 NOTE — Progress Notes (Signed)
 Remote Loop Recorder Transmission

## 2024-02-01 NOTE — Progress Notes (Signed)
 [facility] Summary Report / Loop Recorder

## 2024-02-04 ENCOUNTER — Ambulatory Visit

## 2024-02-04 ENCOUNTER — Encounter

## 2024-02-04 DIAGNOSIS — I480 Paroxysmal atrial fibrillation: Secondary | ICD-10-CM

## 2024-02-04 LAB — CUP PACEART REMOTE DEVICE CHECK
Date Time Interrogation Session: 20251005232155
Implantable Pulse Generator Implant Date: 20230825

## 2024-02-05 NOTE — Progress Notes (Signed)
 Remote Loop Recorder Transmission

## 2024-02-08 ENCOUNTER — Ambulatory Visit: Payer: Self-pay | Admitting: Cardiology

## 2024-03-06 ENCOUNTER — Ambulatory Visit (INDEPENDENT_AMBULATORY_CARE_PROVIDER_SITE_OTHER)

## 2024-03-06 ENCOUNTER — Encounter

## 2024-03-06 DIAGNOSIS — I480 Paroxysmal atrial fibrillation: Secondary | ICD-10-CM | POA: Diagnosis not present

## 2024-03-06 LAB — CUP PACEART REMOTE DEVICE CHECK
Date Time Interrogation Session: 20251105233215
Implantable Pulse Generator Implant Date: 20230825

## 2024-03-07 NOTE — Progress Notes (Signed)
 Remote Loop Recorder Transmission

## 2024-03-11 ENCOUNTER — Ambulatory Visit: Payer: Self-pay | Admitting: Cardiology

## 2024-04-06 ENCOUNTER — Ambulatory Visit: Attending: Cardiology

## 2024-04-07 ENCOUNTER — Encounter

## 2024-04-07 LAB — CUP PACEART REMOTE DEVICE CHECK
Date Time Interrogation Session: 20251206231522
Implantable Pulse Generator Implant Date: 20230825

## 2024-04-09 ENCOUNTER — Ambulatory Visit: Payer: Self-pay | Admitting: Cardiology

## 2024-04-15 NOTE — Progress Notes (Signed)
 Remote Loop Recorder Transmission

## 2024-05-07 ENCOUNTER — Ambulatory Visit: Attending: Cardiology

## 2024-05-07 DIAGNOSIS — I480 Paroxysmal atrial fibrillation: Secondary | ICD-10-CM | POA: Diagnosis not present

## 2024-05-08 ENCOUNTER — Encounter

## 2024-05-08 ENCOUNTER — Ambulatory Visit: Payer: Self-pay | Admitting: Cardiology

## 2024-05-08 LAB — CUP PACEART REMOTE DEVICE CHECK
Date Time Interrogation Session: 20260106231930
Implantable Pulse Generator Implant Date: 20230825

## 2024-05-12 NOTE — Progress Notes (Signed)
 Remote Loop Recorder Transmission

## 2024-06-07 ENCOUNTER — Ambulatory Visit

## 2024-06-09 ENCOUNTER — Encounter

## 2024-07-08 ENCOUNTER — Ambulatory Visit

## 2024-07-10 ENCOUNTER — Encounter

## 2024-08-08 ENCOUNTER — Ambulatory Visit

## 2024-08-11 ENCOUNTER — Encounter

## 2024-09-11 ENCOUNTER — Encounter

## 2024-10-13 ENCOUNTER — Encounter

## 2024-11-13 ENCOUNTER — Encounter

## 2024-12-15 ENCOUNTER — Encounter
